# Patient Record
Sex: Male | Born: 1940 | Race: White | Hispanic: No | Marital: Single | Smoking: Former smoker
Health system: Southern US, Community
[De-identification: ages and names within clinical notes are randomized; demographics above are authoritative.]

## PROBLEM LIST (undated history)

## (undated) ENCOUNTER — Emergency Department (HOSPITAL_COMMUNITY): Admission: EM | Payer: Self-pay | Source: Home / Self Care

## (undated) DIAGNOSIS — M109 Gout, unspecified: Secondary | ICD-10-CM

## (undated) DIAGNOSIS — I10 Essential (primary) hypertension: Secondary | ICD-10-CM

## (undated) DIAGNOSIS — M1990 Unspecified osteoarthritis, unspecified site: Secondary | ICD-10-CM

## (undated) DIAGNOSIS — K429 Umbilical hernia without obstruction or gangrene: Secondary | ICD-10-CM

## (undated) DIAGNOSIS — I2510 Atherosclerotic heart disease of native coronary artery without angina pectoris: Secondary | ICD-10-CM

## (undated) DIAGNOSIS — N200 Calculus of kidney: Secondary | ICD-10-CM

## (undated) DIAGNOSIS — I493 Ventricular premature depolarization: Secondary | ICD-10-CM

## (undated) DIAGNOSIS — I714 Abdominal aortic aneurysm, without rupture, unspecified: Secondary | ICD-10-CM

## (undated) DIAGNOSIS — K439 Ventral hernia without obstruction or gangrene: Secondary | ICD-10-CM

## (undated) DIAGNOSIS — I340 Nonrheumatic mitral (valve) insufficiency: Secondary | ICD-10-CM

## (undated) DIAGNOSIS — E669 Obesity, unspecified: Secondary | ICD-10-CM

## (undated) DIAGNOSIS — I255 Ischemic cardiomyopathy: Secondary | ICD-10-CM

## (undated) DIAGNOSIS — E119 Type 2 diabetes mellitus without complications: Secondary | ICD-10-CM

## (undated) DIAGNOSIS — E785 Hyperlipidemia, unspecified: Secondary | ICD-10-CM

## (undated) DIAGNOSIS — G709 Myoneural disorder, unspecified: Secondary | ICD-10-CM

## (undated) DIAGNOSIS — Z789 Other specified health status: Secondary | ICD-10-CM

## (undated) DIAGNOSIS — I5022 Chronic systolic (congestive) heart failure: Secondary | ICD-10-CM

## (undated) DIAGNOSIS — K219 Gastro-esophageal reflux disease without esophagitis: Secondary | ICD-10-CM

## (undated) HISTORY — DX: Essential (primary) hypertension: I10

## (undated) HISTORY — DX: Other specified health status: Z78.9

## (undated) HISTORY — PX: CARDIAC CATHETERIZATION: SHX172

## (undated) HISTORY — DX: Abdominal aortic aneurysm, without rupture, unspecified: I71.40

## (undated) HISTORY — PX: CARPAL TUNNEL RELEASE: SHX101

## (undated) HISTORY — DX: Ventral hernia without obstruction or gangrene: K43.9

## (undated) HISTORY — DX: Hyperlipidemia, unspecified: E78.5

## (undated) HISTORY — DX: Gout, unspecified: M10.9

## (undated) HISTORY — DX: Obesity, unspecified: E66.9

## (undated) HISTORY — DX: Ischemic cardiomyopathy: I25.5

## (undated) HISTORY — DX: Abdominal aortic aneurysm, without rupture: I71.4

## (undated) HISTORY — PX: ROTATOR CUFF REPAIR: SHX139

## (undated) HISTORY — DX: Nonrheumatic mitral (valve) insufficiency: I34.0

## (undated) HISTORY — DX: Type 2 diabetes mellitus without complications: E11.9

## (undated) HISTORY — DX: Ventricular premature depolarization: I49.3

## (undated) HISTORY — DX: Calculus of kidney: N20.0

## (undated) HISTORY — DX: Umbilical hernia without obstruction or gangrene: K42.9

---

## 1998-09-03 ENCOUNTER — Ambulatory Visit (HOSPITAL_BASED_OUTPATIENT_CLINIC_OR_DEPARTMENT_OTHER): Admission: RE | Admit: 1998-09-03 | Discharge: 1998-09-03 | Payer: Self-pay | Admitting: Orthopedic Surgery

## 1999-02-04 ENCOUNTER — Ambulatory Visit (HOSPITAL_BASED_OUTPATIENT_CLINIC_OR_DEPARTMENT_OTHER): Admission: RE | Admit: 1999-02-04 | Discharge: 1999-02-04 | Payer: Self-pay | Admitting: Orthopedic Surgery

## 1999-03-25 ENCOUNTER — Ambulatory Visit (HOSPITAL_BASED_OUTPATIENT_CLINIC_OR_DEPARTMENT_OTHER): Admission: RE | Admit: 1999-03-25 | Discharge: 1999-03-25 | Payer: Self-pay | Admitting: Orthopedic Surgery

## 1999-07-30 ENCOUNTER — Encounter: Payer: Self-pay | Admitting: Orthopedic Surgery

## 1999-07-30 ENCOUNTER — Encounter: Admission: RE | Admit: 1999-07-30 | Discharge: 1999-07-30 | Payer: Self-pay | Admitting: Orthopedic Surgery

## 2004-12-16 ENCOUNTER — Ambulatory Visit: Payer: Self-pay | Admitting: Family Medicine
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
## 2005-11-15 ENCOUNTER — Other Ambulatory Visit: Payer: Self-pay

## 2005-11-15 ENCOUNTER — Emergency Department: Payer: Self-pay | Admitting: Emergency Medicine

## 2005-11-15 IMAGING — CR DG CHEST 1V PORT
1 series · 1 of 1 positions shown · non-contrast
Comparison: none

REASON FOR EXAM: CHEST PAIN
COMMENTS:

PROCEDURE:     DXR - DXR PORTABLE CHEST SINGLE VIEW  - [DATE]  [DATE]
RESULT:          The lungs are clear.  The cardiac silhouette and visualized
bony skeleton are unremarkable.

[view not recorded]
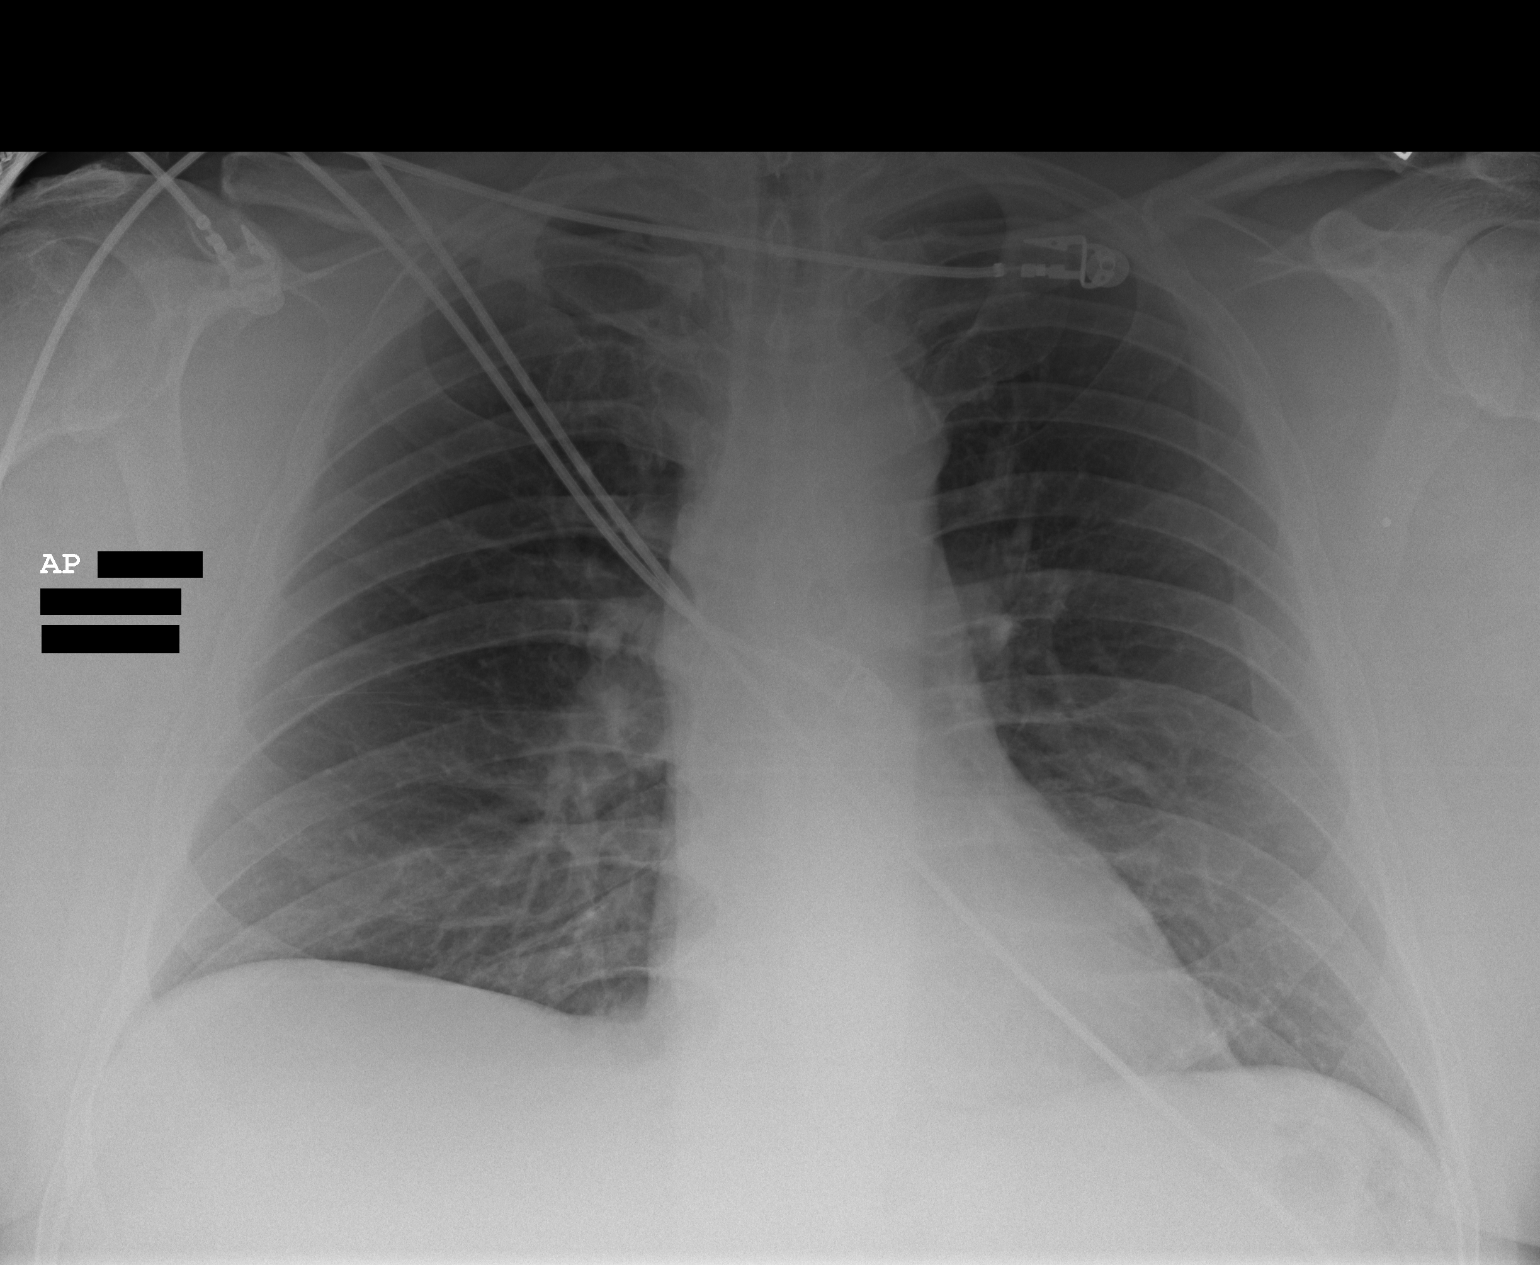

[1 of 1 positions shown; findings below may reference images not displayed]

IMPRESSION: Chest radiograph without evidence of acute
cardiopulmonary disease.

## 2006-04-27 ENCOUNTER — Emergency Department: Payer: Self-pay | Admitting: Emergency Medicine

## 2006-04-27 IMAGING — CT CT STONE STUDY
1 of 2 series · 15 of 32 positions shown, 19 images · non-contrast
Comparison: none

REASON FOR EXAM: Abdominal pain. Rm 2
COMMENTS:

[Series 2: stone · axial · 0.94mm/px · z∈[-348,+86]mm · 15 of 159 slices shown, 19 images]
[im 7/159  soft-tissue]
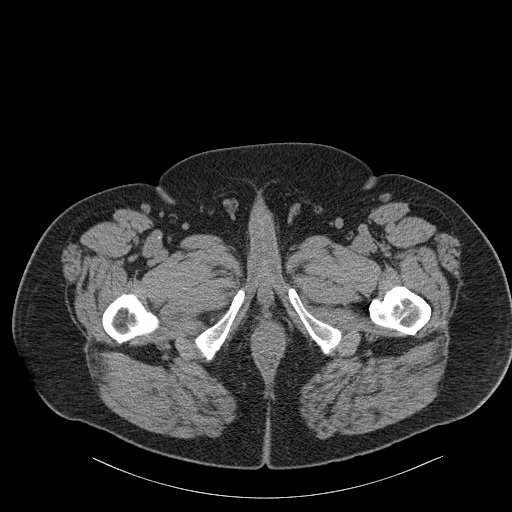
[im 7/159  bone]
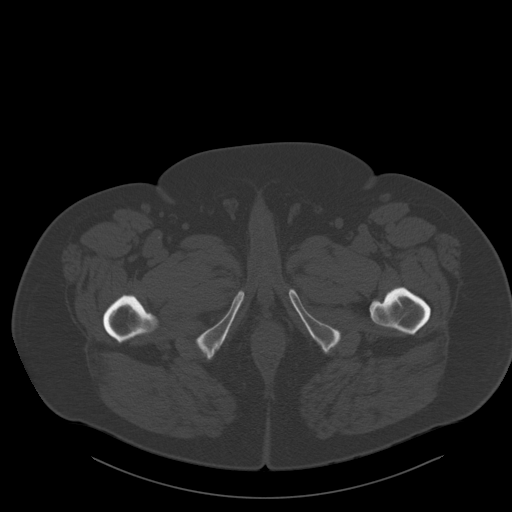
[im 20/159  soft-tissue]
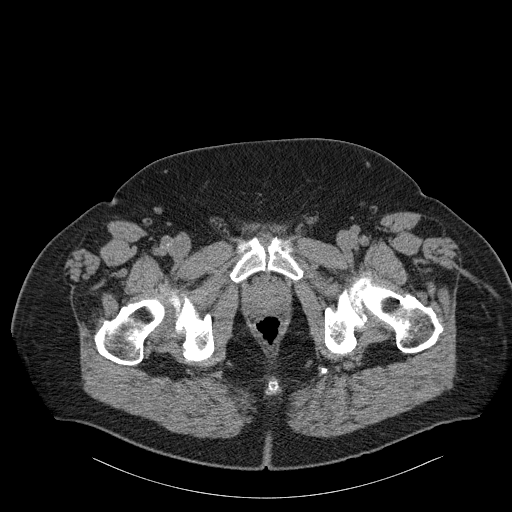
[im 33/159  soft-tissue]
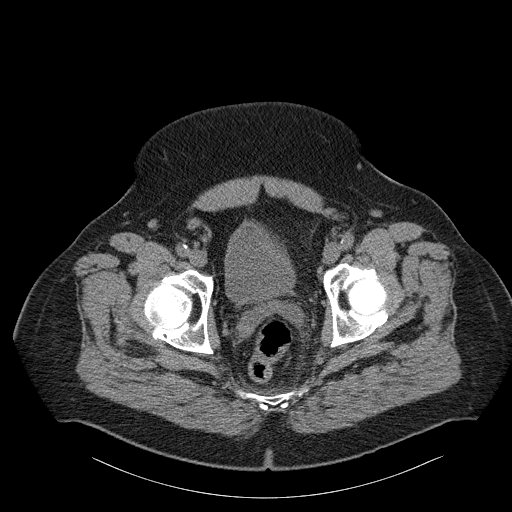
[im 47/159  soft-tissue]
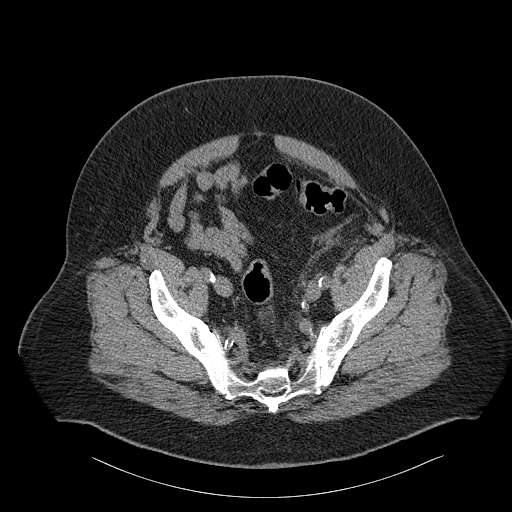
[im 53/159  soft-tissue]
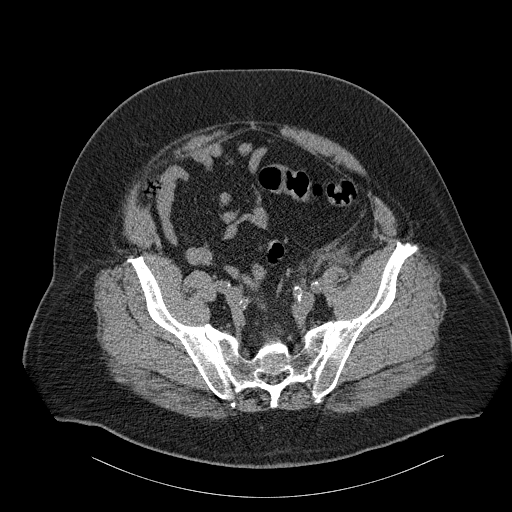
[im 66/159  soft-tissue]
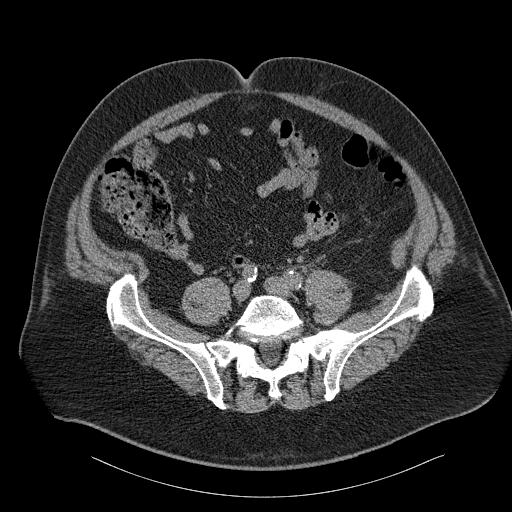
[im 80/159  soft-tissue]
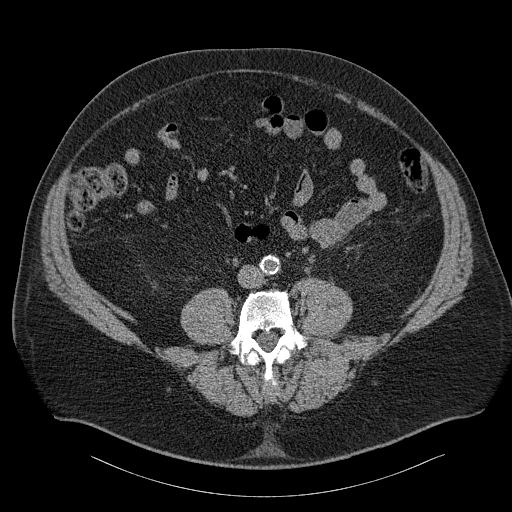
[im 93/159  soft-tissue]
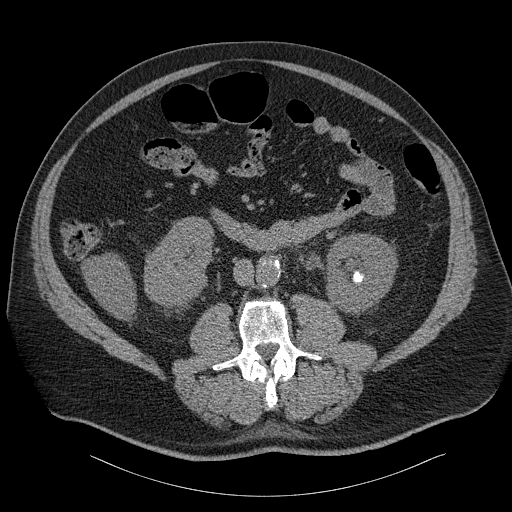
[im 106/159  soft-tissue]
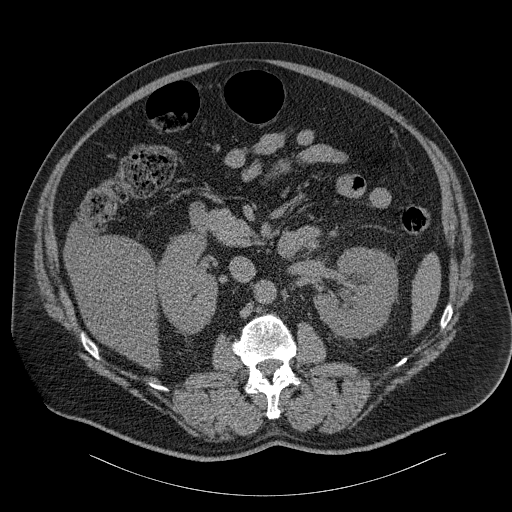
[im 106/159  bone]
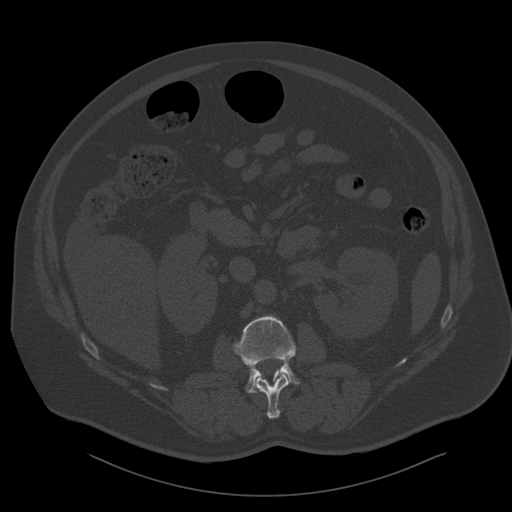
[im 112/159  soft-tissue]
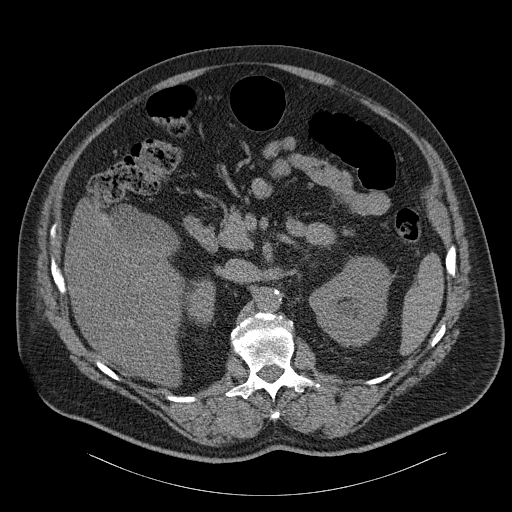
[im 126/159  soft-tissue]
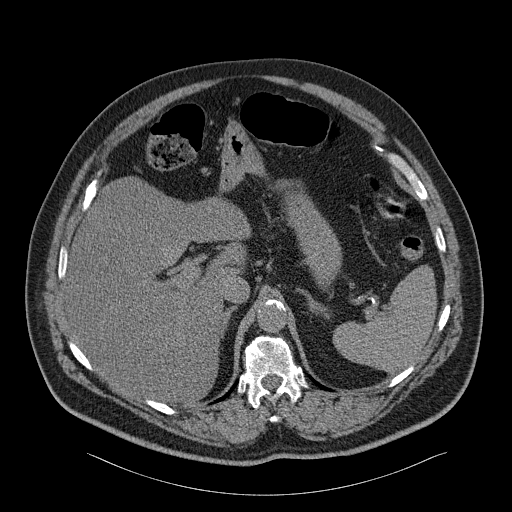
[im 132/159  lung]
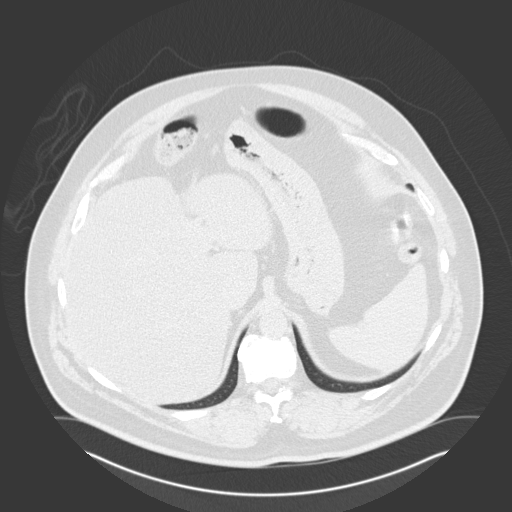
[im 139/159  soft-tissue]
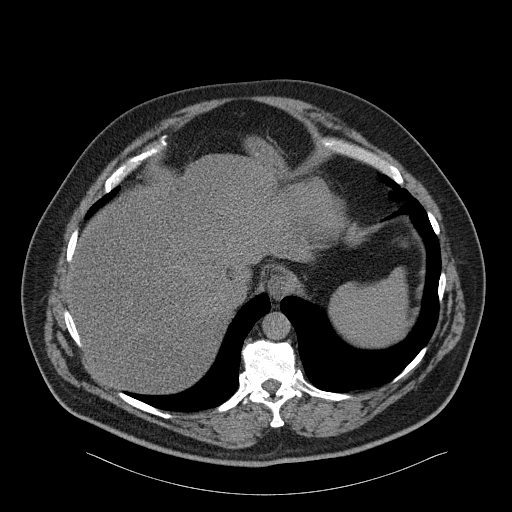
[im 139/159  lung]
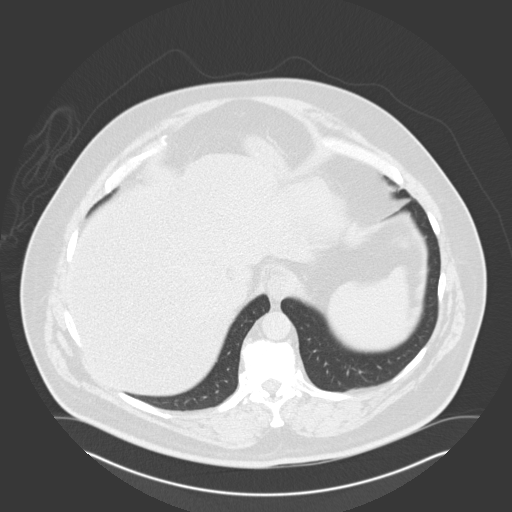
[im 145/159  lung]
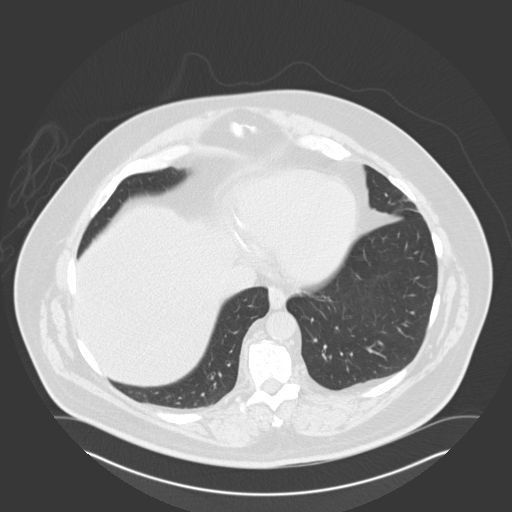
[im 152/159  soft-tissue]
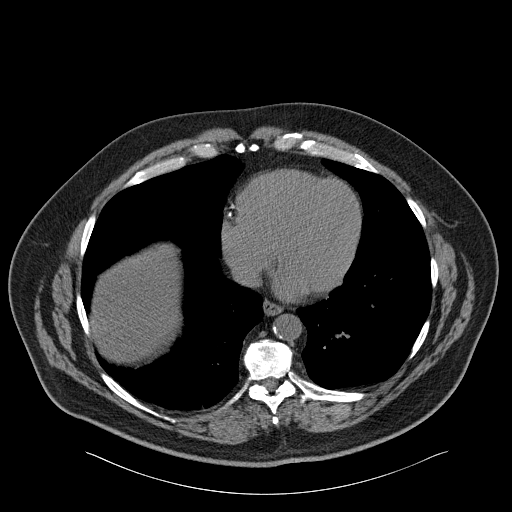
[im 152/159  lung]
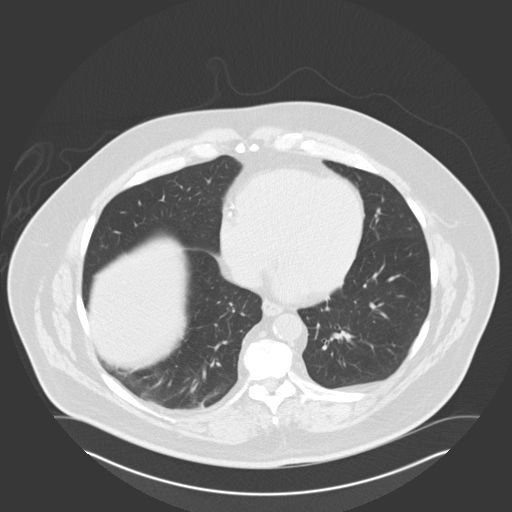

[15 of 32 positions shown; findings below may reference images not displayed]

PROCEDURE:     CT  - CT ABDOMEN /PELVIS WO (STONE)  - [DATE] [DATE]

RESULT:     Noncontrast emergent CT scan of the abdomen and pelvis is
performed.  The patient has no prior studies in the PACS system for
comparison.  The study demonstrates multiple stones in the LEFT renal
collecting system. These appear to be fairly large with the largest in the
lower pole region measuring up to 10 to 12 mm in diameter.  There is a stone
in the LEFT ureteropelvic junction region measuring approximately 5 mm
causing moderate hydronephrosis.  There is an extrinsic lesion from the
RIGHT kidney inferomedially along its posterior margin for which follow-up
triphasic CT may be beneficial.  This area is small.  There is some
perinephric stranding bilaterally which may be old.  Some acute inflammatory
stranding on the LEFT could not be excluded.  Atherosclerotic calcification
is seen in the aorta.  No definite bladder stones are evident.  There is
some thickening of the perirenal fascia on the LEFT with some extension
along the fascial plane into the LEFT lower quadrant.  No abnormal bowel
distention is seen.  The other abdominal viscera appear to be grossly
normal.
IMPRESSION: Multiple stones of the LEFT kidney with an obstructing 5.5 mm LEFT UPJ
stone.

Some perinephric stranding is seen.

Possible mass arising from the posterior inferomedial aspect of the RIGHT
kidney for which triphasic CT is recommended.

Atherosclerotic calcification.

## 2006-04-29 ENCOUNTER — Ambulatory Visit: Payer: Self-pay | Admitting: Urology

## 2006-04-29 IMAGING — CR DG ABDOMEN 1V
1 series · 2 of 2 positions shown · non-contrast
Comparison: none

REASON FOR EXAM: XRAY KUB NEPHROLITHIASIS PT NEED FILMS
COMMENTS:

[Series 1: view not recorded · 0.17mm/px · 2 of 2 slices shown]
[im 1/2]
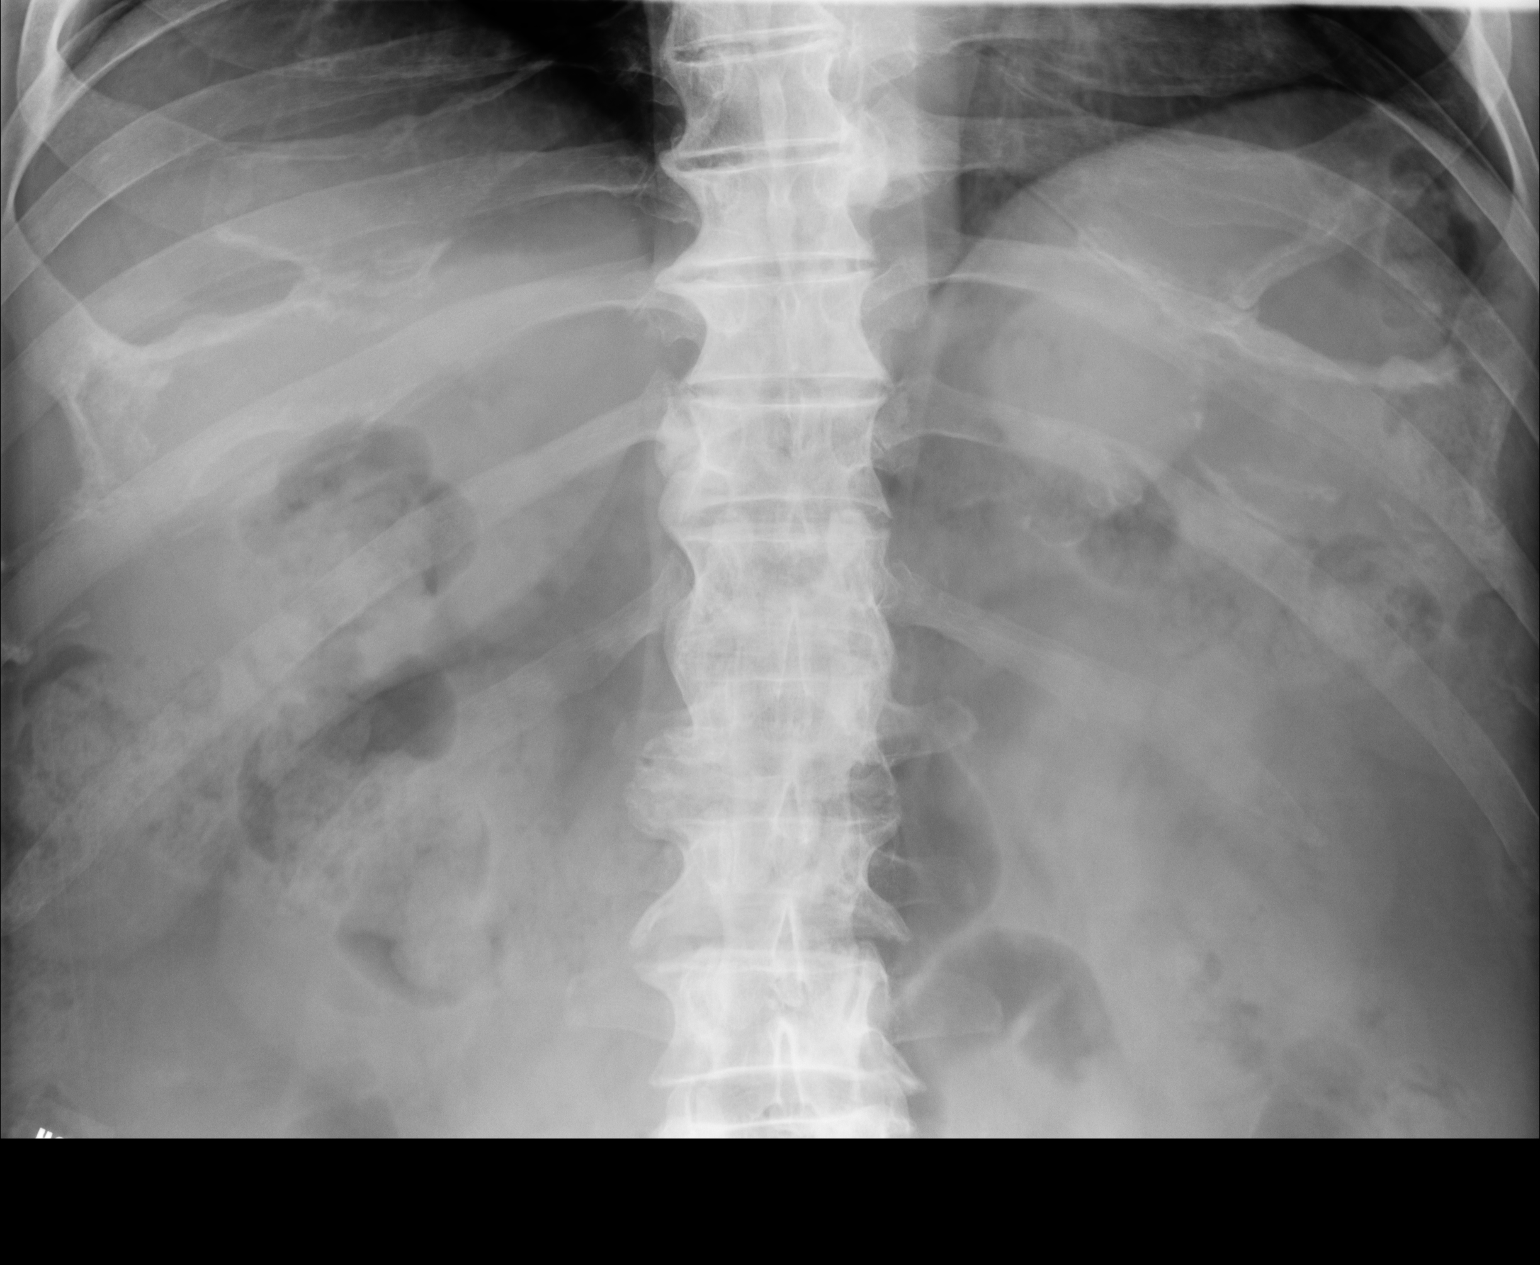
[im 2/2]
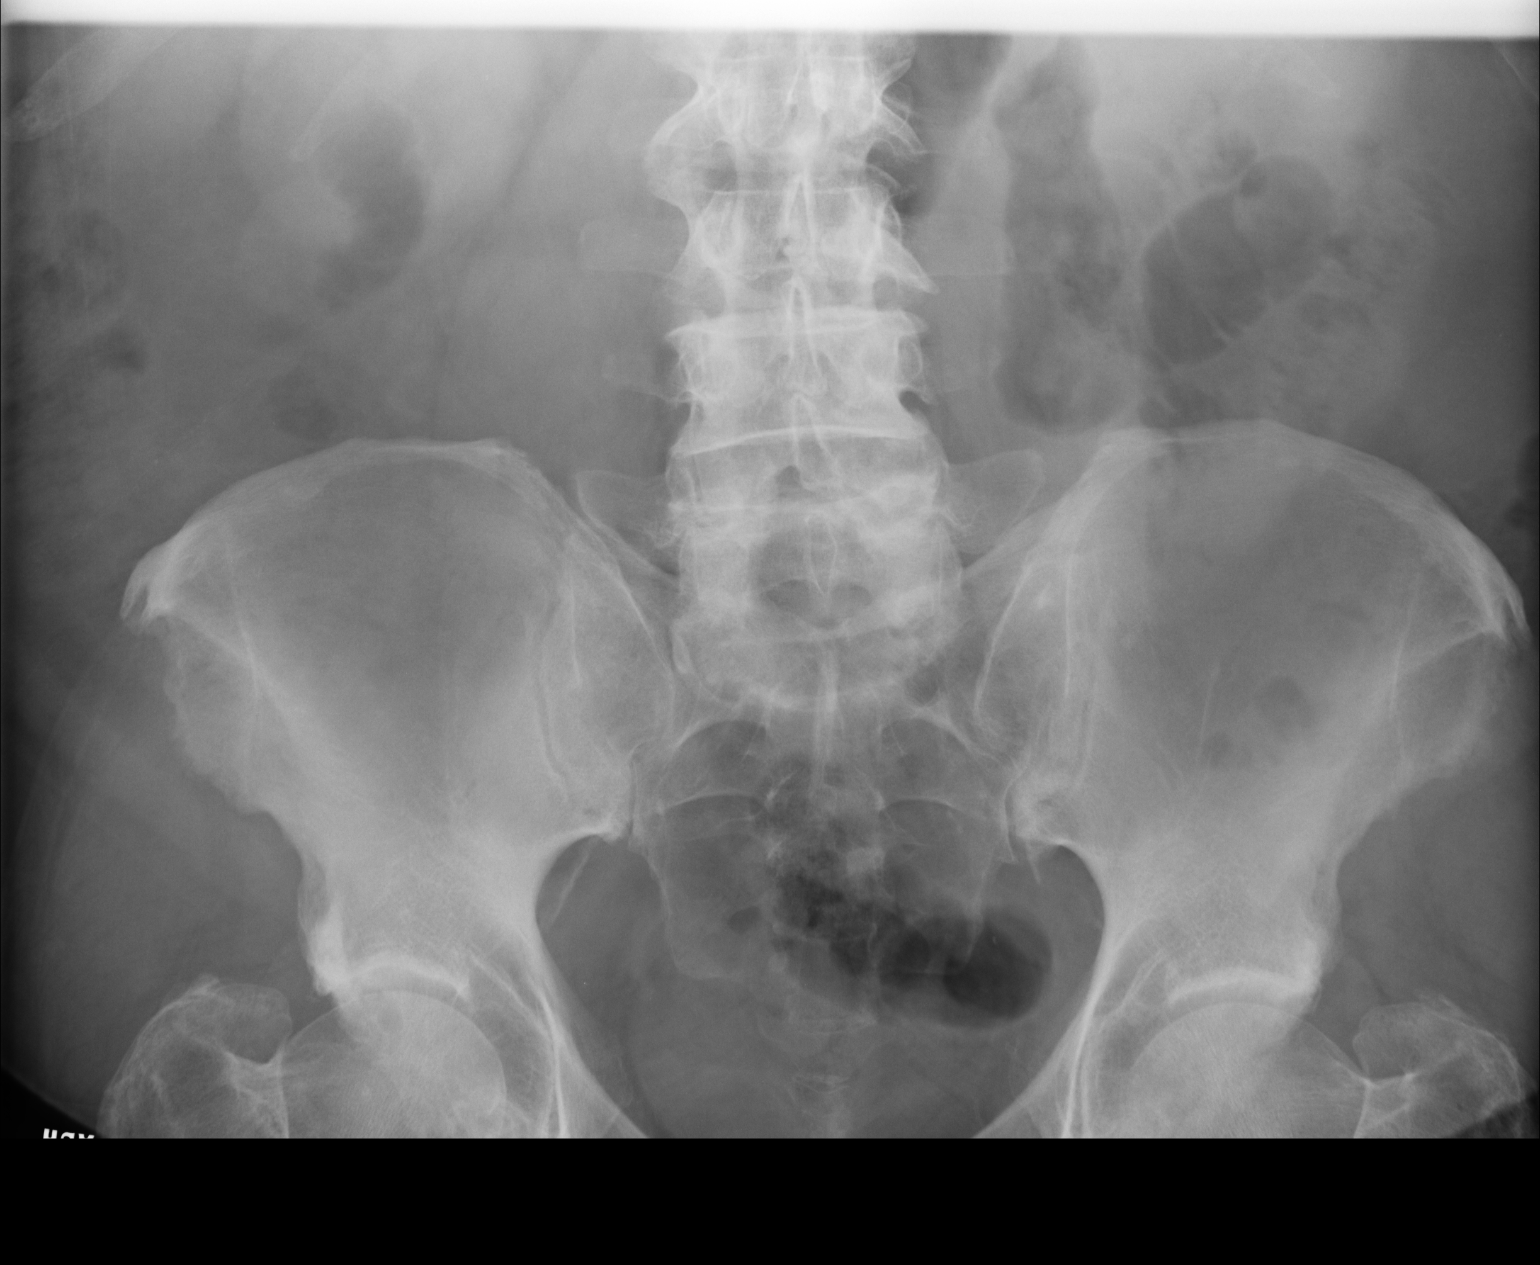

[2 of 2 positions shown; findings below may reference images not displayed]

PROCEDURE:     DXR - DXR KIDNEY URETER BLADDER  - [DATE]  [DATE]

RESULT:     AP view of the abdomen was obtained.  The multiple LEFT renal
stones noted at CT are not well seen on plain film examination.  There is a
possible faint calcification at the lower pole of the LEFT kidney, but the
previously noted stone at the LEFT ureteropelvic junction is not noted.  No
definite distal ureteral stones are seen. No renal or ureteral stones are
noted on the RIGHT.
IMPRESSION: 1)The patient has multiple LEFT renal stones documented at CT, but these are
not well seen on plain film examination.

## 2006-06-15 ENCOUNTER — Ambulatory Visit: Payer: Self-pay | Admitting: Urology

## 2006-06-15 IMAGING — CT CT PELVIS W/O CM
1 series · 16 of 32 positions shown, 20 images · non-contrast
Comparison: none

REASON FOR EXAM: Uric acid stones and renal mass
COMMENTS:

[Series 2: pelvis · axial · 0.74mm/px · z∈[+30,+262]mm · 16 of 34 slices shown, 20 images]
[im 3/34  soft-tissue]
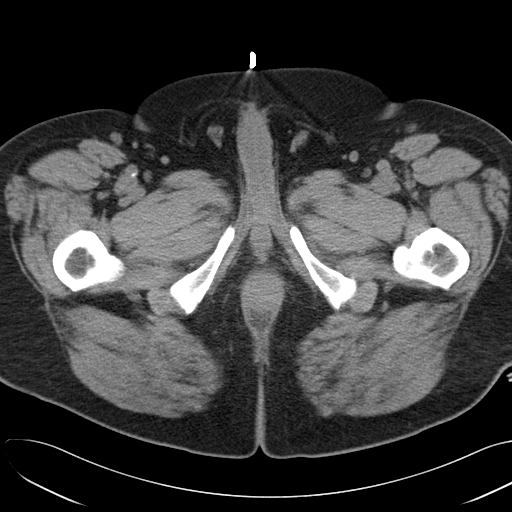
[im 3/34  bone]
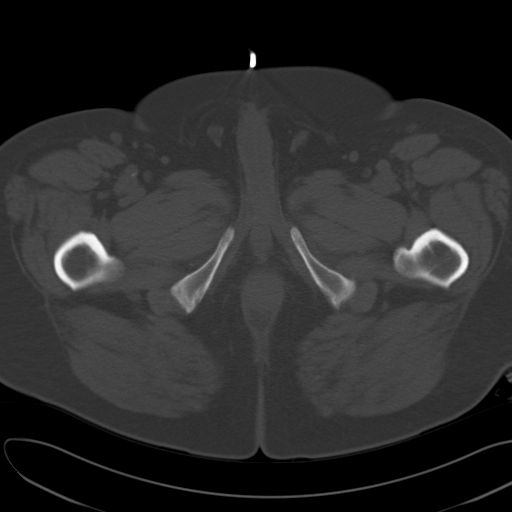
[im 5/34  soft-tissue]
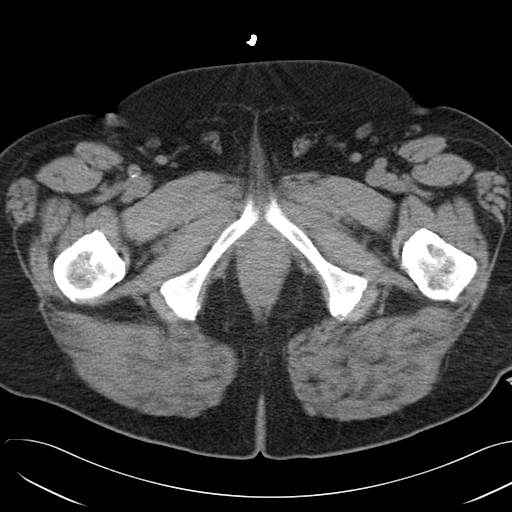
[im 7/34  soft-tissue]
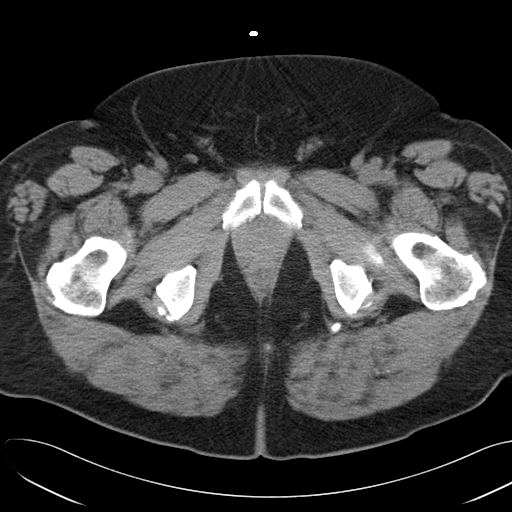
[im 9/34  soft-tissue]
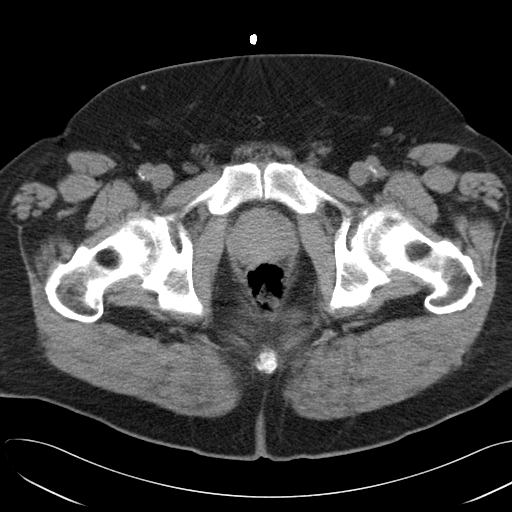
[im 11/34  soft-tissue]
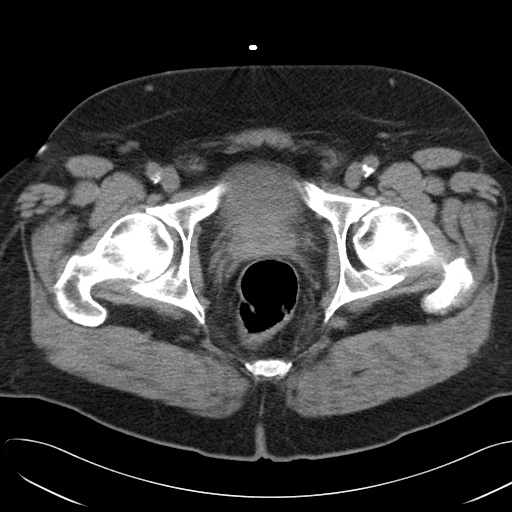
[im 13/34  soft-tissue]
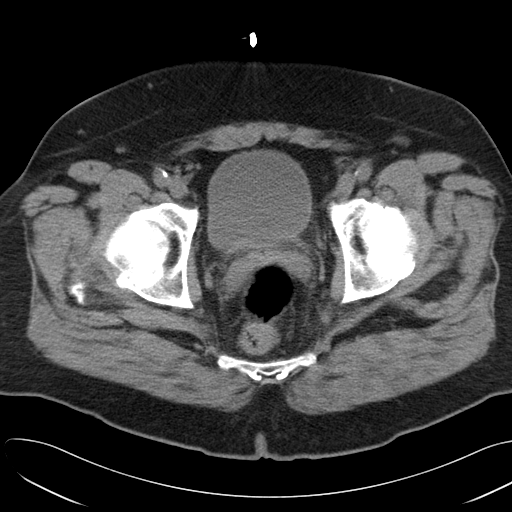
[im 15/34  soft-tissue]
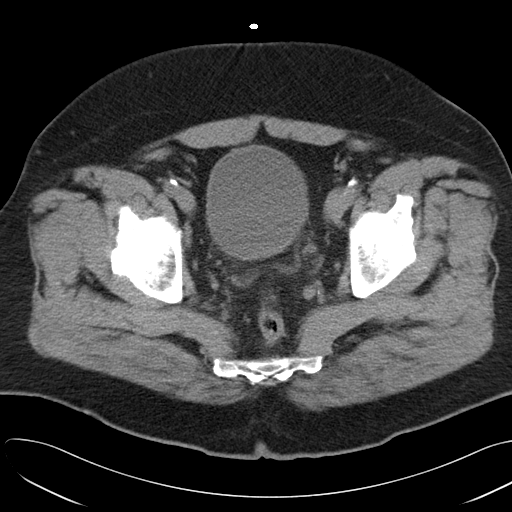
[im 19/34  soft-tissue]
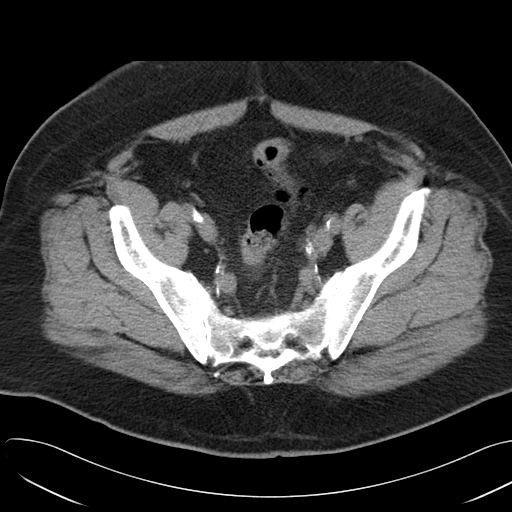
[im 21/34  soft-tissue]
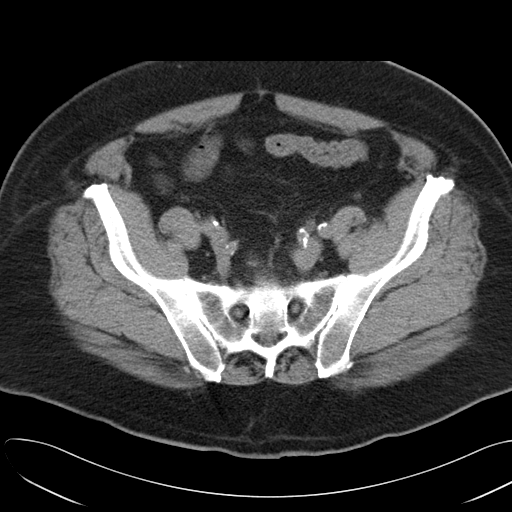
[im 21/34  bone]
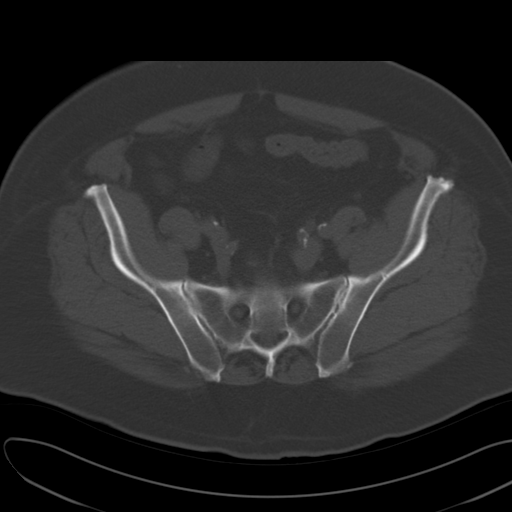
[im 23/34  soft-tissue]
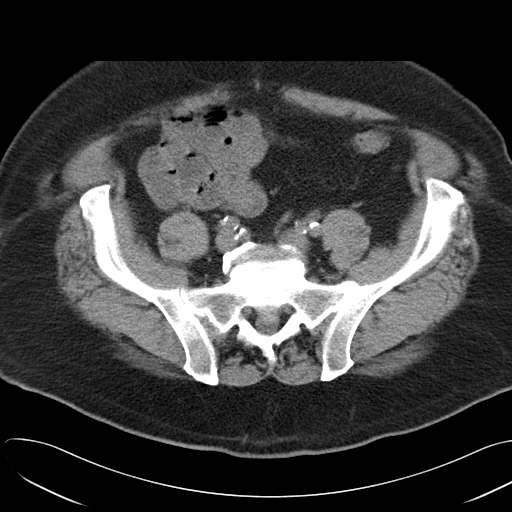
[im 25/34  soft-tissue]
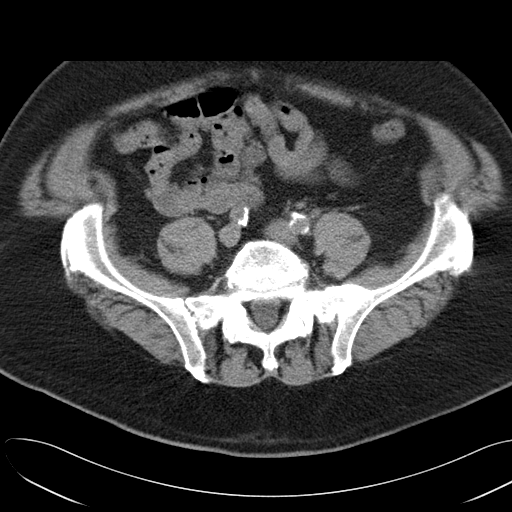
[im 27/34  soft-tissue]
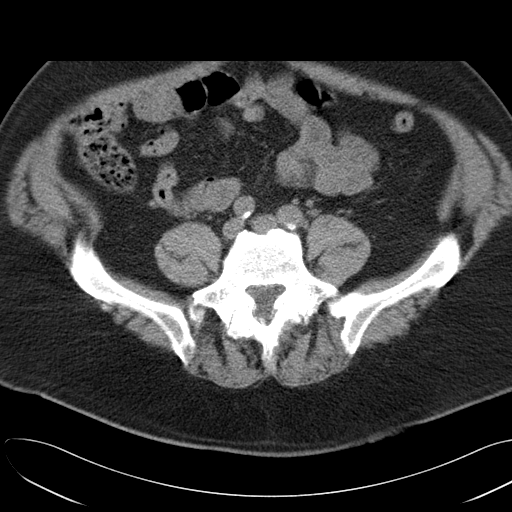
[im 29/34  soft-tissue]
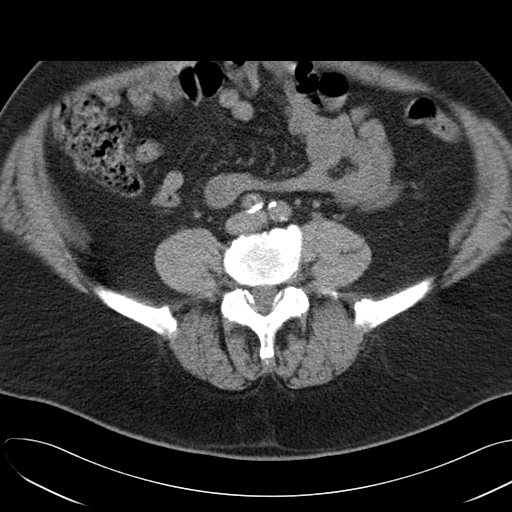
[im 29/34  lung]
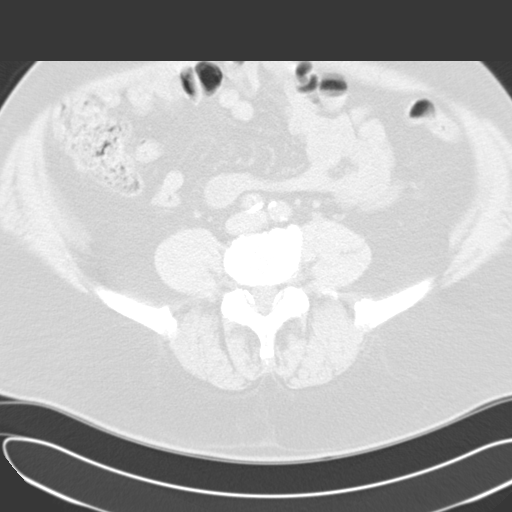
[im 30/34  lung]
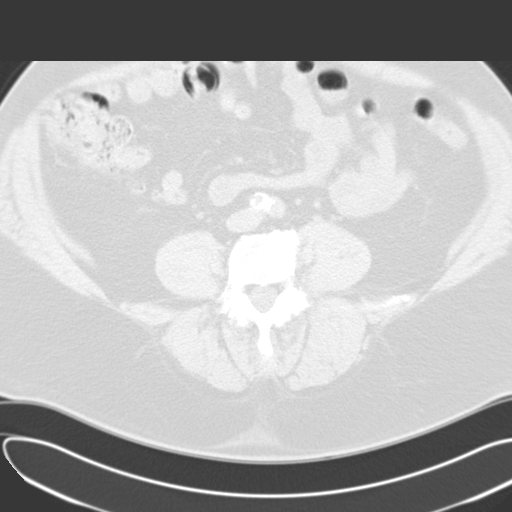
[im 31/34  soft-tissue]
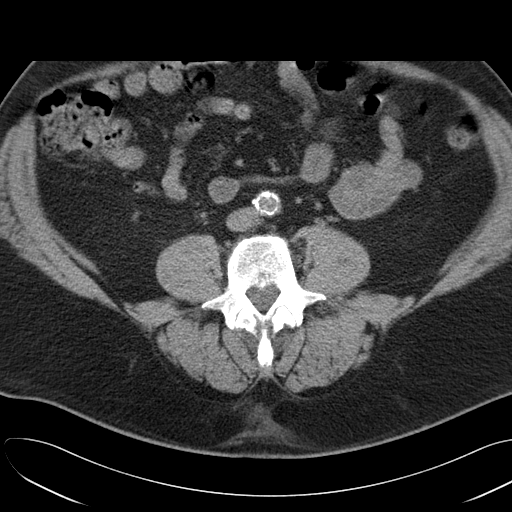
[im 31/34  lung]
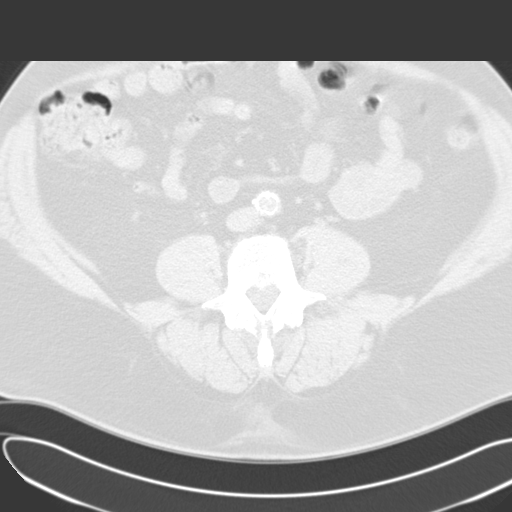
[im 32/34  lung]
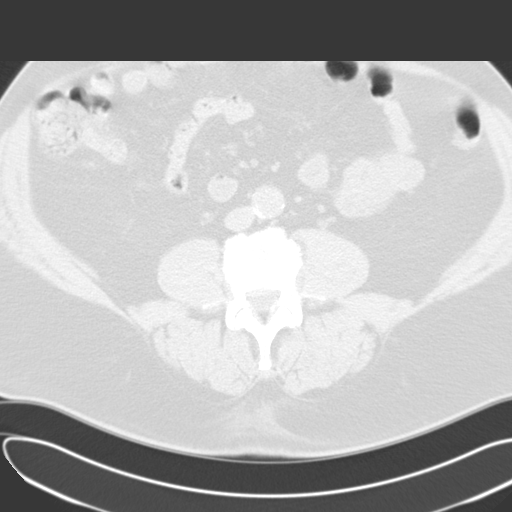

[16 of 32 positions shown; findings below may reference images not displayed]

PROCEDURE:     CT  - CT PELVIS STANDARD WO  - [DATE]  [DATE]

RESULT:     The study was performed without contrast as requested.

Within the pelvis the urinary bladder is partially distended.  The prostate
gland produces a mild impression upon the urinary bladder base.  I do not
see free fluid in the pelvis.  The sigmoid colon and rectum are grossly
normal though nondistended.  I do not see evidence of an inguinal hernia.
No inguinal or pelvic sidewall lymphadenopathy is seen.
IMPRESSION: I do not see abnormality of the urinary bladder on this noncontrast study.
The mildly enlarged prostate gland produces an impression upon the urinary
bladder base.

I see no acute abnormality of the sigmoid colon or evidence of
lymphadenopathy.

## 2006-06-15 IMAGING — CT CT ABDOMEN WO/W CM
2 of 4 series · 14 of 32 positions shown, 19 images · non-contrast
Comparison: none

REASON FOR EXAM: Uric Acid Stones and Renal Mass
COMMENTS:

[Series 3: without · axial · non-contrast · 0.88mm/px · z∈[+222,+446]mm · 8 of 37 slices shown, 13 images]
[im 5/37  soft-tissue]
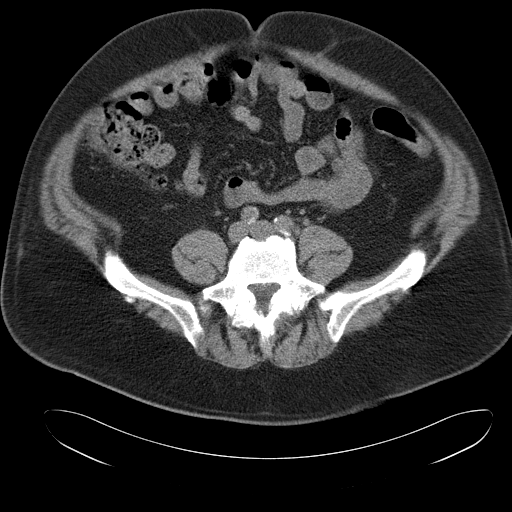
[im 5/37  bone]
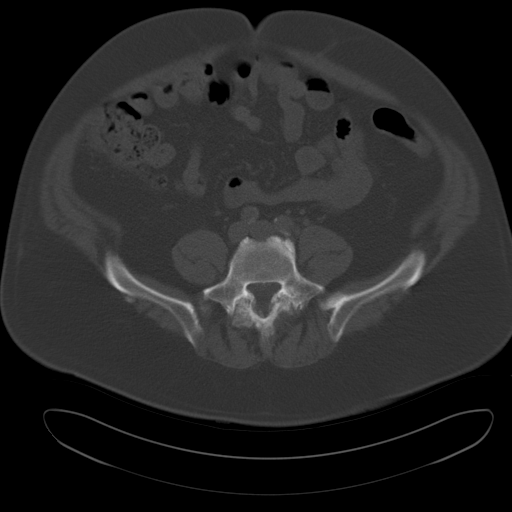
[im 9/37  soft-tissue]
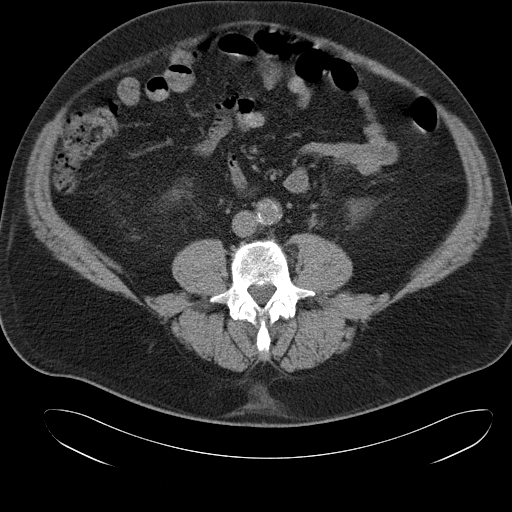
[im 13/37  soft-tissue]
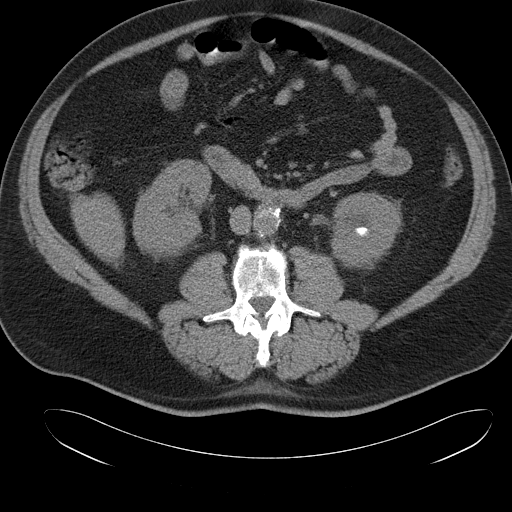
[im 17/37  soft-tissue]
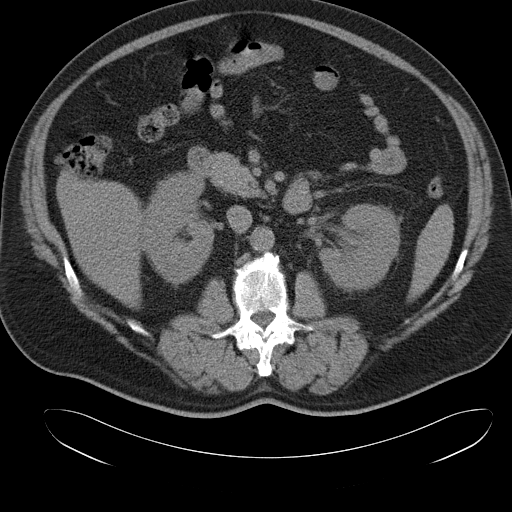
[im 21/37  soft-tissue]
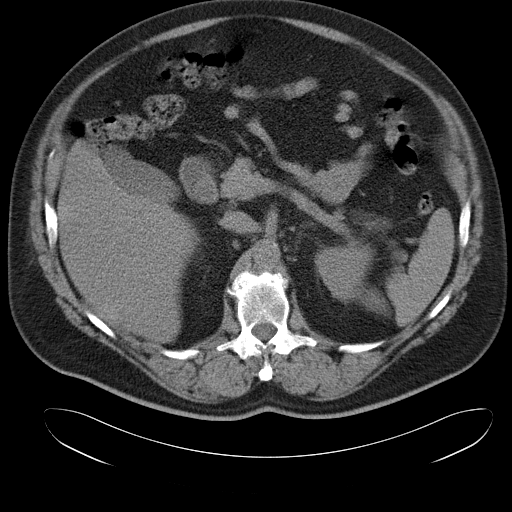
[im 21/37  lung]
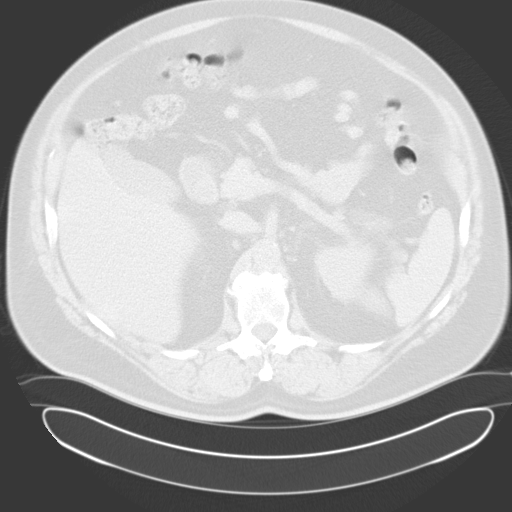
[im 25/37  soft-tissue]
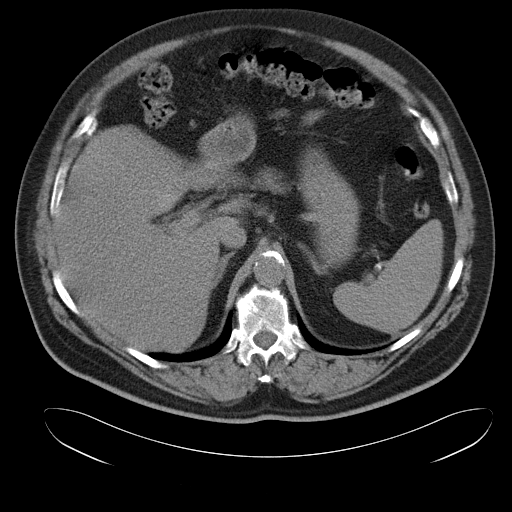
[im 25/37  lung]
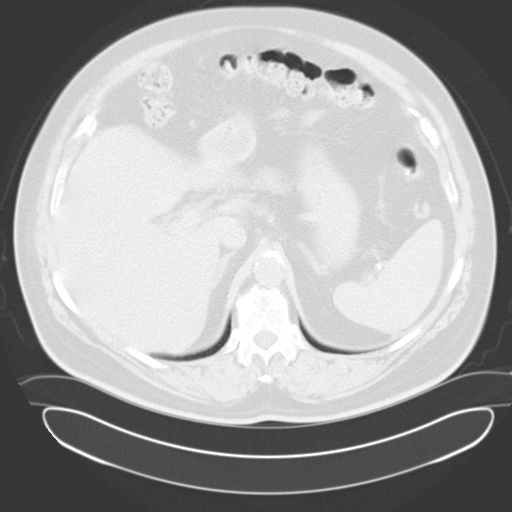
[im 29/37  soft-tissue]
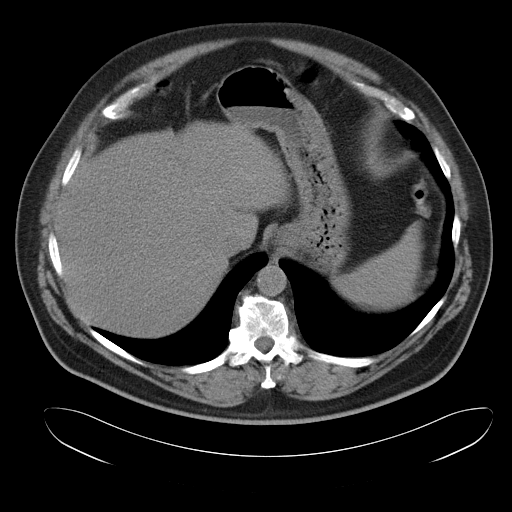
[im 29/37  lung]
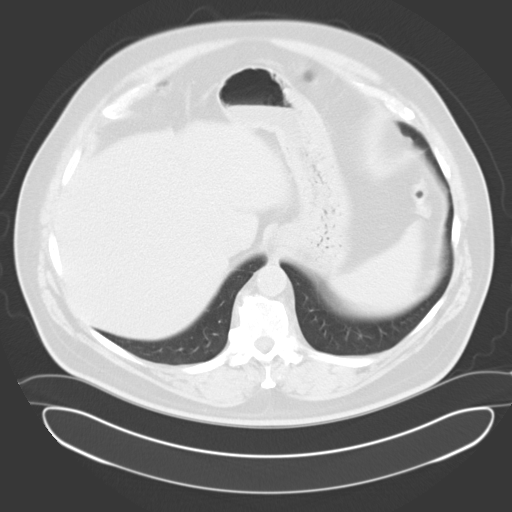
[im 33/37  soft-tissue]
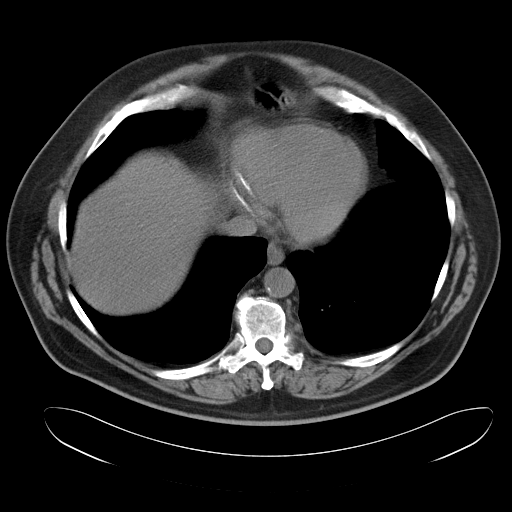
[im 33/37  lung]
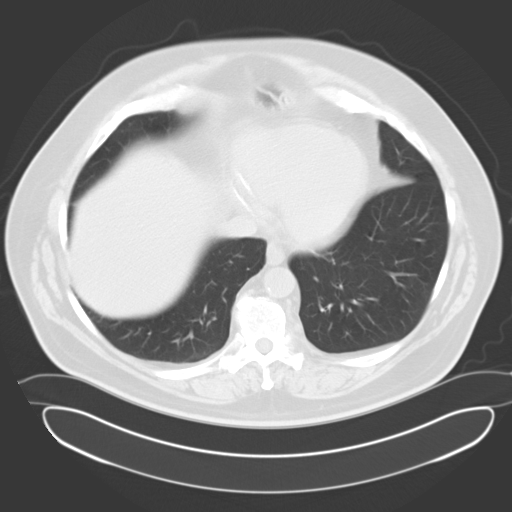

[Series 5: with · axial · 0.96mm/px · z∈[+222,+406]mm · 6 of 37 slices shown]
[im 5/37  soft-tissue]
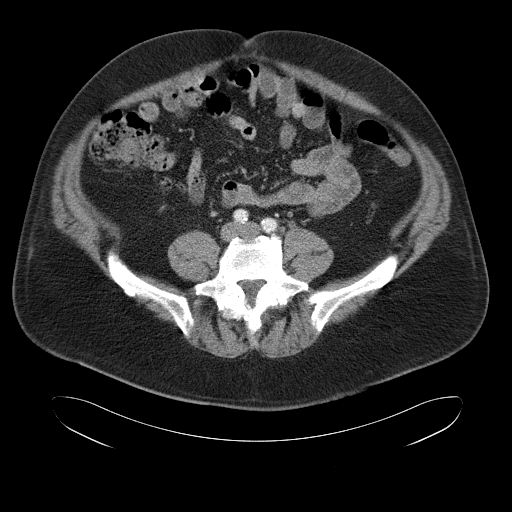
[im 10/37  soft-tissue]
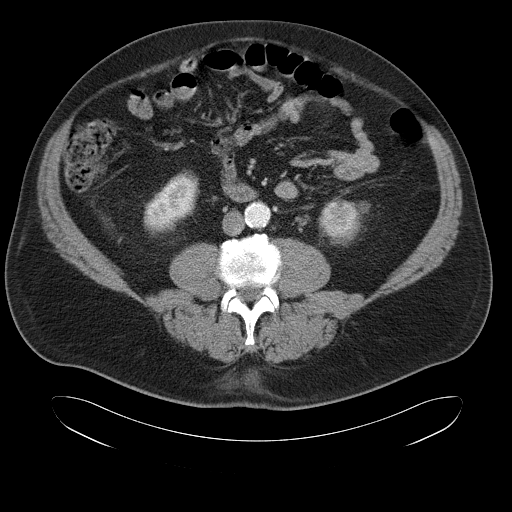
[im 14/37  soft-tissue]
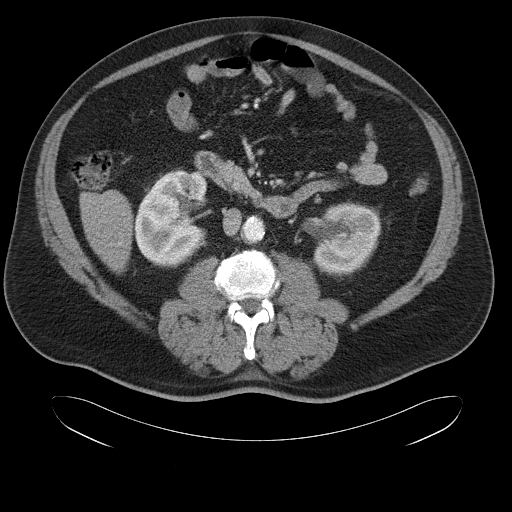
[im 19/37  soft-tissue]
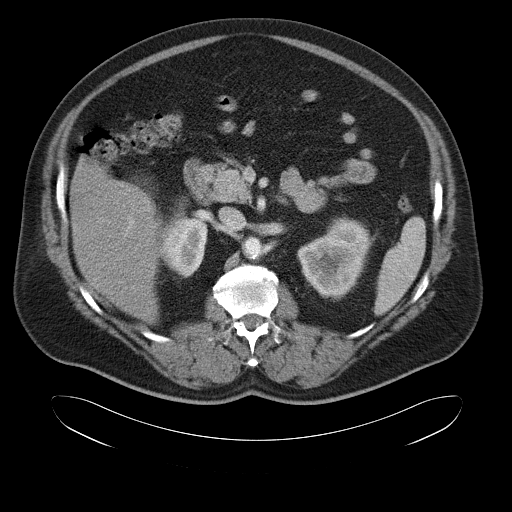
[im 23/37  soft-tissue]
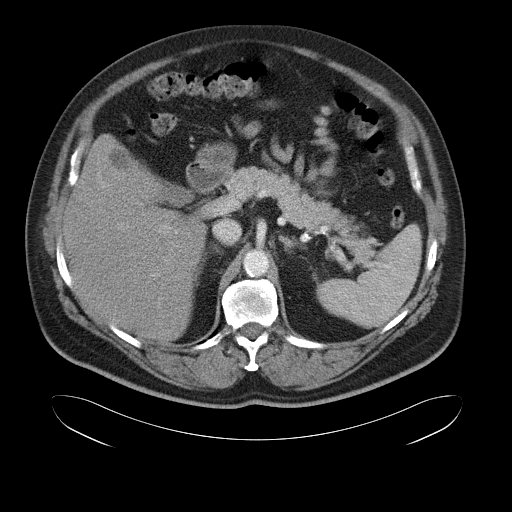
[im 28/37  soft-tissue]
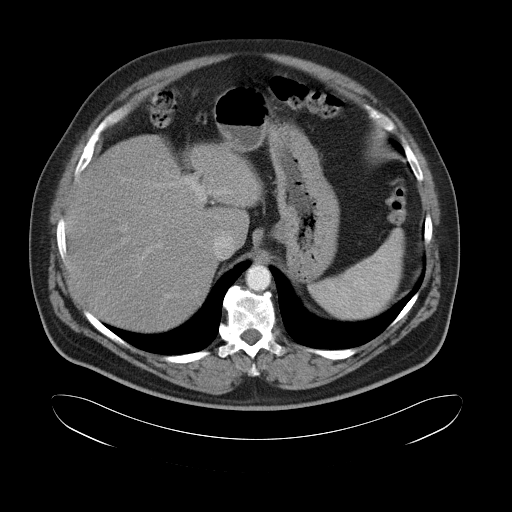

[14 of 32 positions shown; findings below may reference images not displayed]

PROCEDURE:     CT  - CT ABDOMEN STANDARD W/WO  - [DATE]  [DATE]

RESULT:       The patient is being evaluated for known uric acid stones as
well as a renal mass suspected on the RIGHT.

On the RIGHT, involving the lower pole posteriorly there is a hyperdense
focus.  It measures approximately 52 HU on the precontrast images.  This
measures 51 on the postcontrast images and on the delayed images it measures
approximately 68 HU.  This likely reflects a hyperdense cyst.

The perinephric fat is normal bilaterally.  There is a tiny hypodensity
associated with the lower pole of the LEFT kidney with Hounsfield
measurements of 3 precontrast arising to 17 postcontrast and rising further
to 31 on delayed images.  This is a relatively significant spread in the
contrast enhancement values and further evaluation of the LEFT kidney with
ultrasound will be needed.

Delayed images do reveal a relatively normal appearing renal collecting
system bilaterally.  The calcification seen on the precontrast study are
obscured by the contrast in the LEFT kidney.  There is no contrast in the
proximal LEFT ureter.

The liver, gallbladder, stomach, spleen, adrenal glands and pancreas exhibit
no acute abnormality.  The lung bases are clear.
IMPRESSION: 1.     There are known stones involving the LEFT kidney.  I do not see
evidence of hydronephrosis currently.
2.     There is a tiny exophytic lower pole hypodensity anteriorly that does
not behave in a fashion classic for a simple cyst.  Renal ultrasound is
recommended.
3.     A hypodensity seen associated with the lower pole of the RIGHT kidney
on the prior study does have Hounsfield measurements most compatible with a
hyperdense cyst.
4.     Not mentioned above, is a tiny infrarenal abdominal aortic aneurysm
with maximal measured diameter of 2.8 cm.

## 2007-03-17 ENCOUNTER — Ambulatory Visit: Payer: Self-pay | Admitting: Urology

## 2007-03-17 IMAGING — CT CT ABDOMEN WO/W CM
2 of 4 series · 14 of 32 positions shown, 19 images · non-contrast
Comparison: none

REASON FOR EXAM: Right renal mass
COMMENTS:

[Series 2: wo · axial · 0.95mm/px · z∈[-328,-120]mm · 8 of 34 slices shown, 13 images]
[im 4/34  soft-tissue]
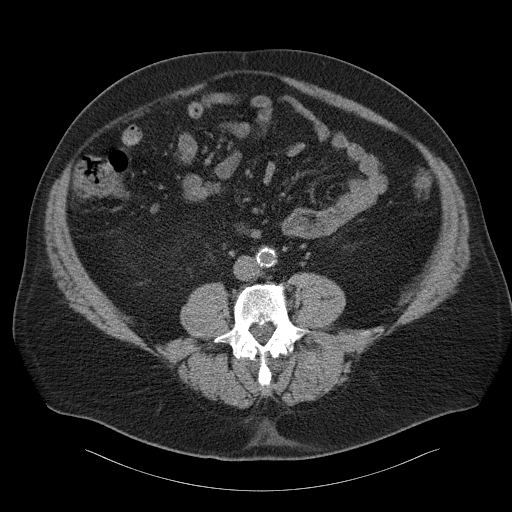
[im 4/34  bone]
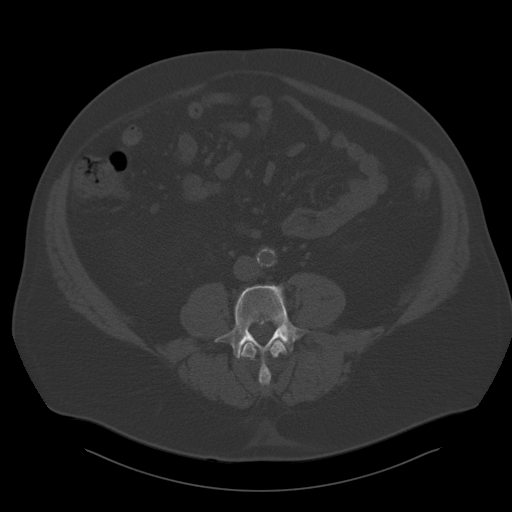
[im 8/34  soft-tissue]
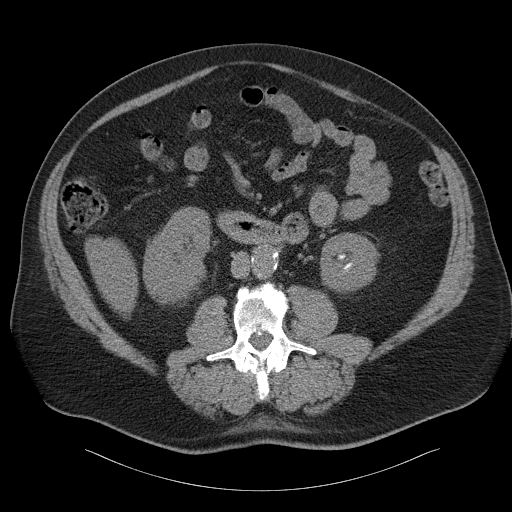
[im 12/34  soft-tissue]
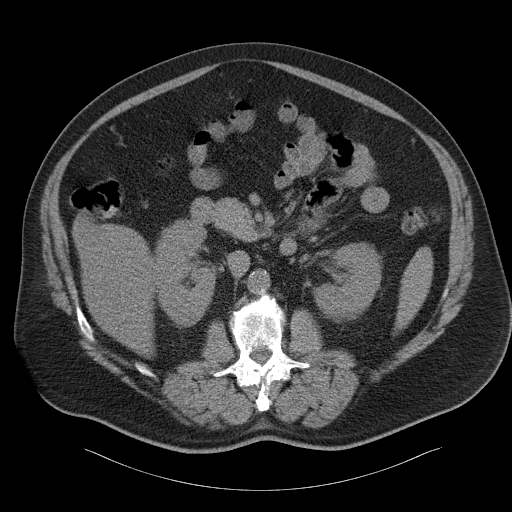
[im 15/34  soft-tissue]
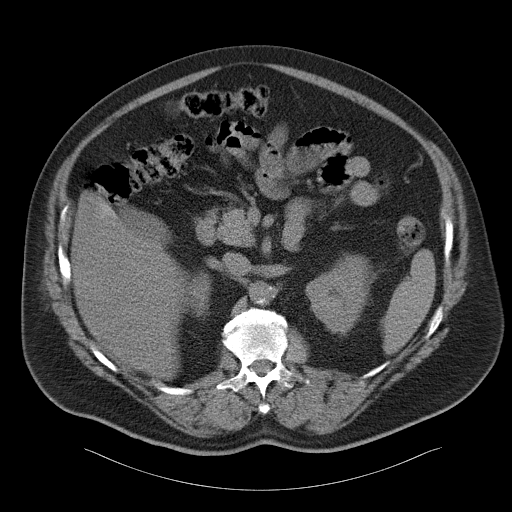
[im 19/34  soft-tissue]
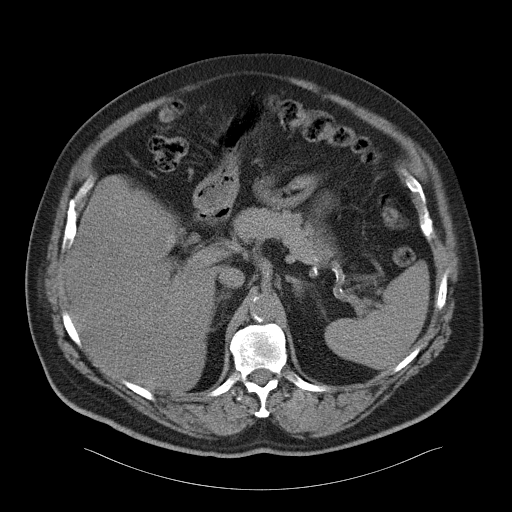
[im 19/34  lung]
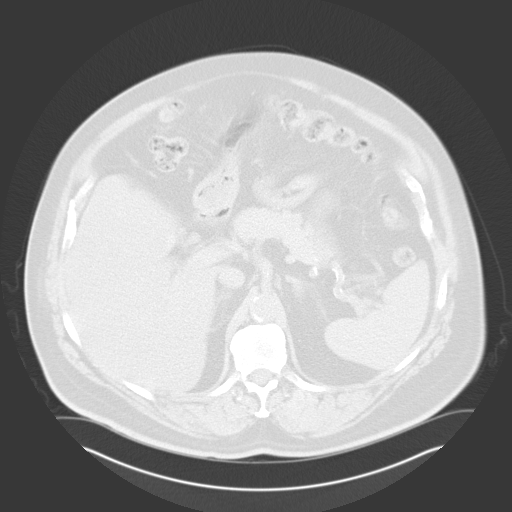
[im 23/34  soft-tissue]
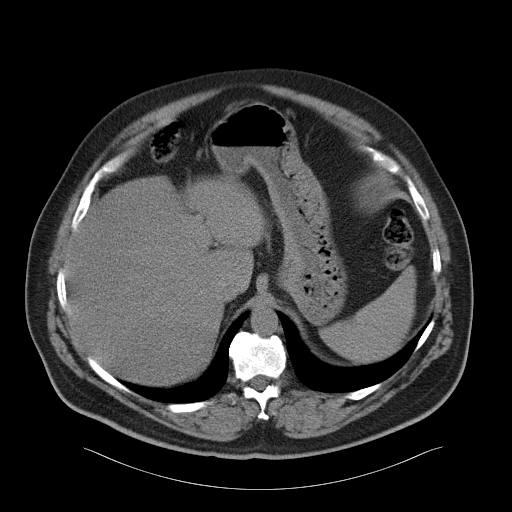
[im 23/34  lung]
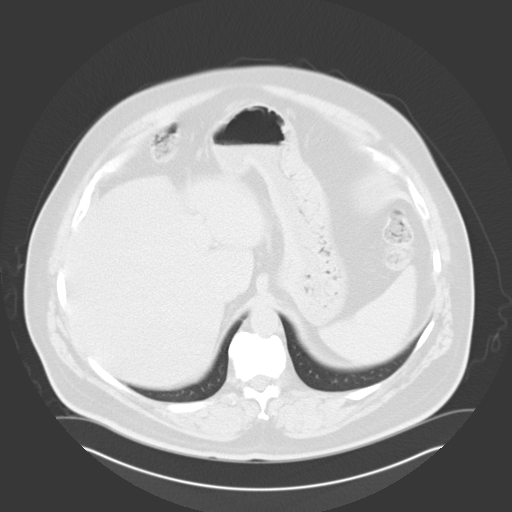
[im 26/34  soft-tissue]
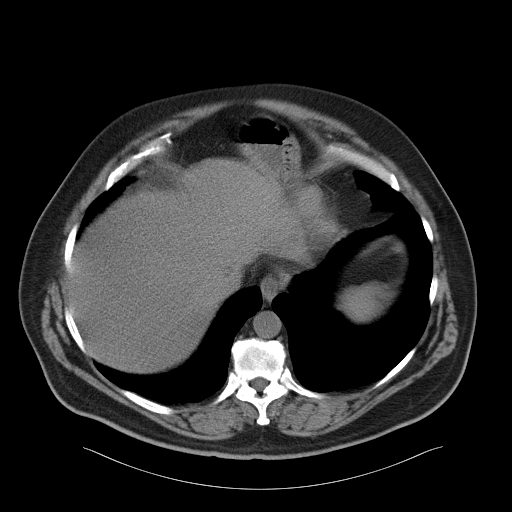
[im 26/34  lung]
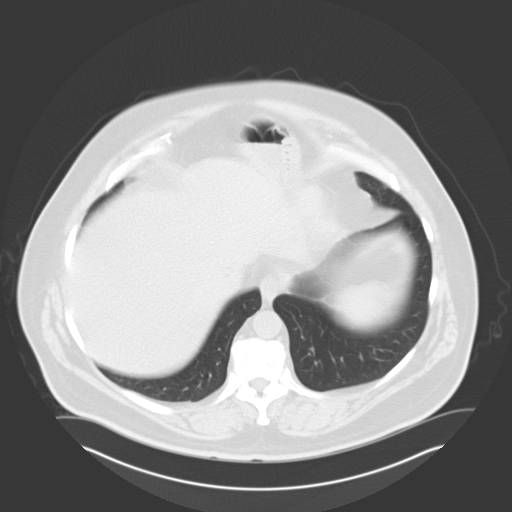
[im 30/34  soft-tissue]
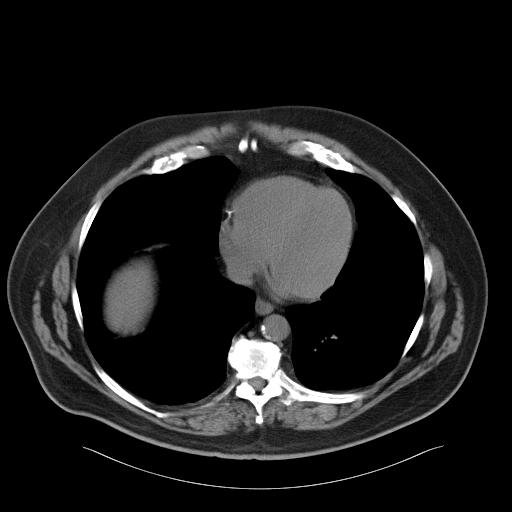
[im 30/34  lung]
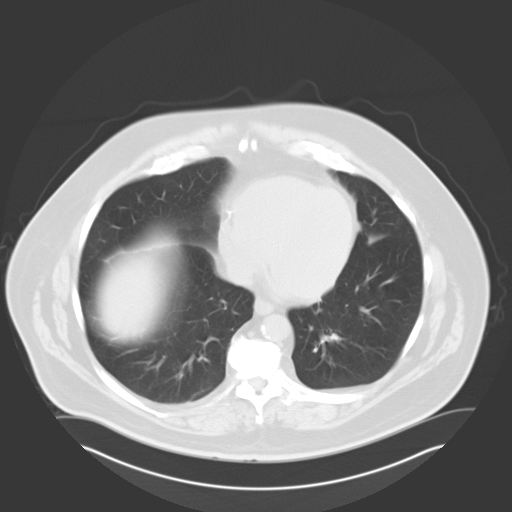

[Series 3: with · axial · 0.95mm/px · z∈[-320,-160]mm · 6 of 34 slices shown]
[im 5/34  soft-tissue]
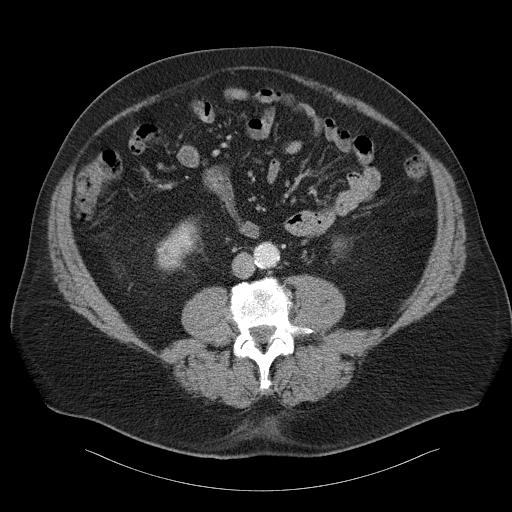
[im 9/34  soft-tissue]
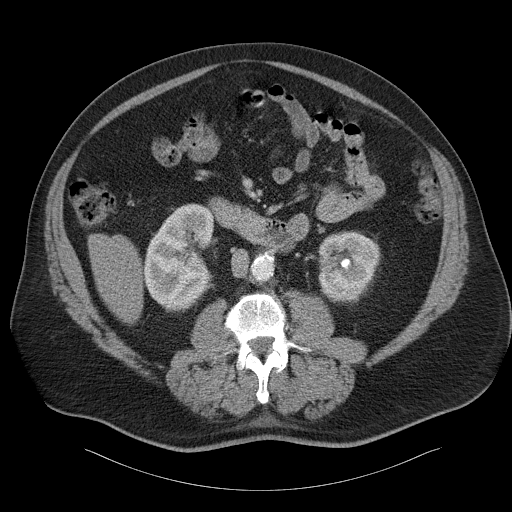
[im 13/34  soft-tissue]
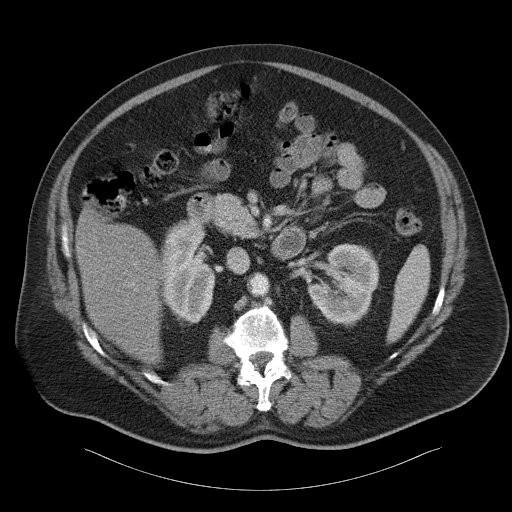
[im 17/34  soft-tissue]
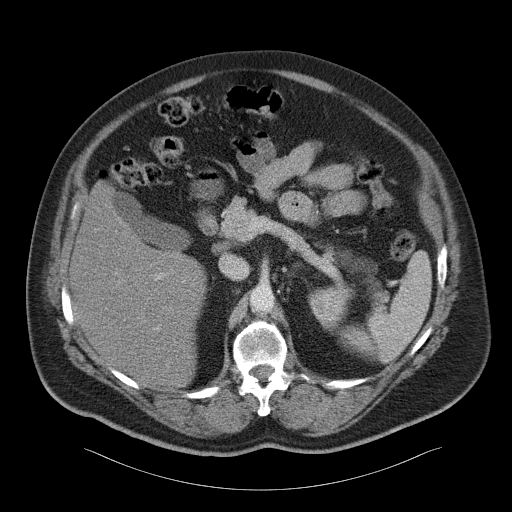
[im 21/34  soft-tissue]
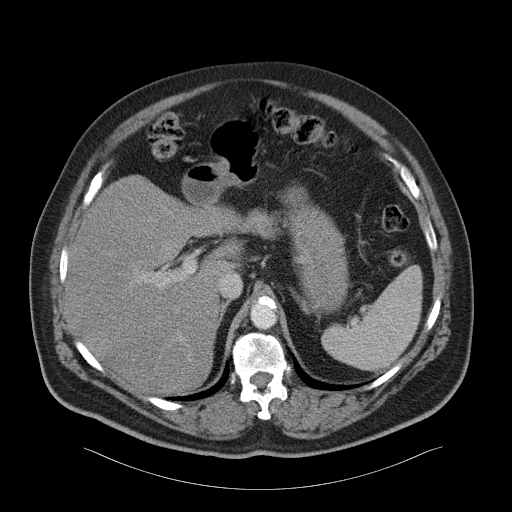
[im 25/34  soft-tissue]
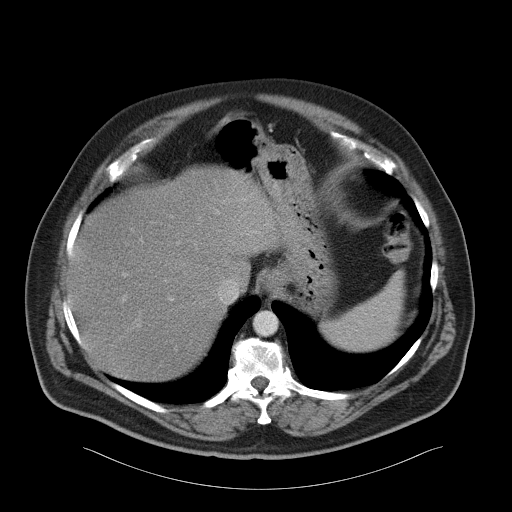

[14 of 32 positions shown; findings below may reference images not displayed]

PROCEDURE:     CT  - CT ABDOMEN STANDARD W/WO  - [DATE]  [DATE]

RESULT:     A triphasic scan was performed to allow evaluation of a known
mass in the RIGHT kidney.

Comparison is made to a prior study of [DATE]. On the RIGHT, there is a
persistent, slightly hyper dense focus involving the posterior aspect of the
lower pole of the RIGHT kidney. It is exophytic and measures approximately
1.3 cm transversely x approximately 1.3 cm AP. Hounsfield measurement
precontrast is 73, 57 immediately post contrast, and 63 on delayed images.
This is most compatible with a hyper dense cyst. I do not see other lesions
involving the RIGHT kidney. On the LEFT, exophytic from the anterolateral
aspect of the lower pole, there is a stable, approximately 11.0 mm in
diameter focus with Hounsfield measurements of approximately 0 on all three
phases. Perinephric fat is normal in density bilaterally. There are
collecting system calcifications which are non-obstructing on the LEFT which
appear stable.

The liver, gallbladder, stomach, spleen, pancreas and adrenal glands are
normal in appearance. There is failure to taper of the caliber of the
abdominal aorta with maximal measured AP dimension of approximately 2.9 cm
with maximum measured transverse dimension of 2.3 cm. The unopacified loops
of small and large bowel are normal in appearance. The lung bases are clear.
IMPRESSION: 1.  There is stable hypodensities associated with the lower poles of both
kidneys. On the RIGHT, the findings are consistent with a hyperdense cyst
and on the LEFT a more normal Hounsfield measurement in the range of 0 was
seen typical of a benign cyst.
2.  There is a stable, infrarenal abdominal aortic aneurysm with maximal
measured dimension of 2.9 cm.
3.  There are calcifications in the LEFT kidney not producing obstruction.

## 2007-04-30 ENCOUNTER — Emergency Department: Payer: Self-pay | Admitting: Emergency Medicine

## 2007-04-30 ENCOUNTER — Other Ambulatory Visit: Payer: Self-pay

## 2007-07-13 ENCOUNTER — Emergency Department: Payer: Self-pay | Admitting: Emergency Medicine

## 2007-07-13 ENCOUNTER — Other Ambulatory Visit: Payer: Self-pay

## 2007-07-13 IMAGING — CT CT ABD-PELV W/ CM
1 of 2 series · 15 of 32 positions shown, 19 images · IV contrast (APPLIED)
Comparison: none

REASON FOR EXAM: (1) iv contrast only; (2) history of AAA, periumbilical
pain x 1 week, worse wit
COMMENTS:

[Series 4: aaa · axial · 0.95mm/px · z∈[-4,+428]mm · 15 of 162 slices shown, 19 images]
[im 12/162  soft-tissue]
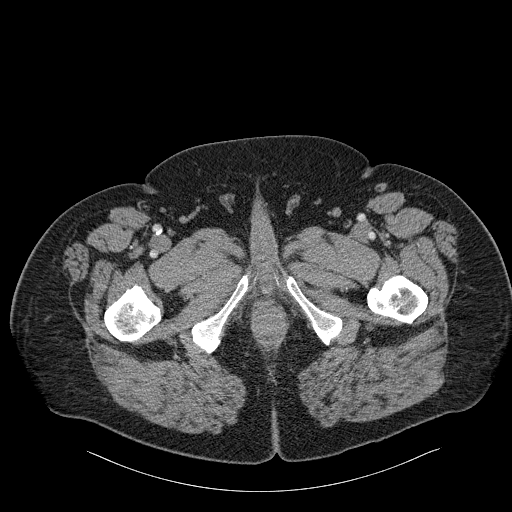
[im 12/162  bone]
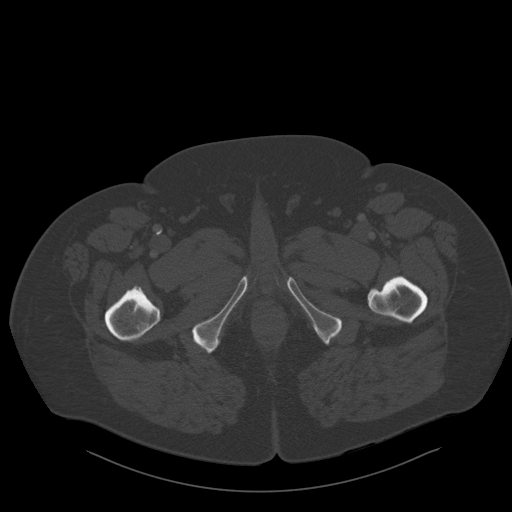
[im 24/162  soft-tissue]
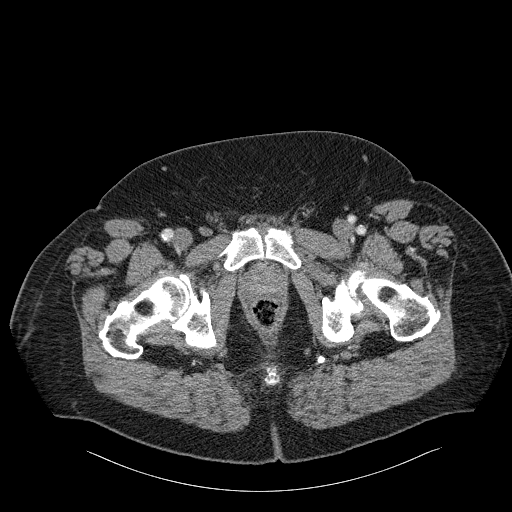
[im 36/162  soft-tissue]
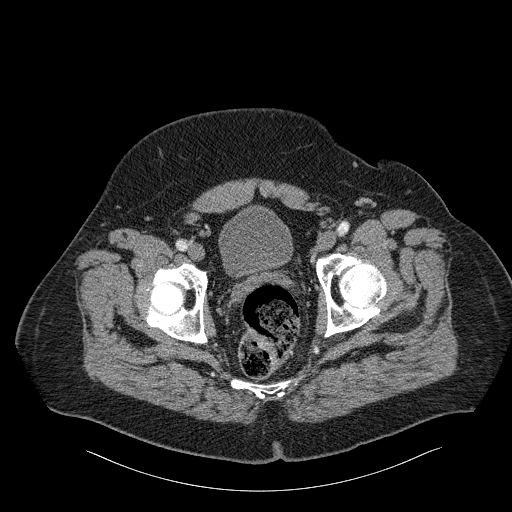
[im 48/162  soft-tissue]
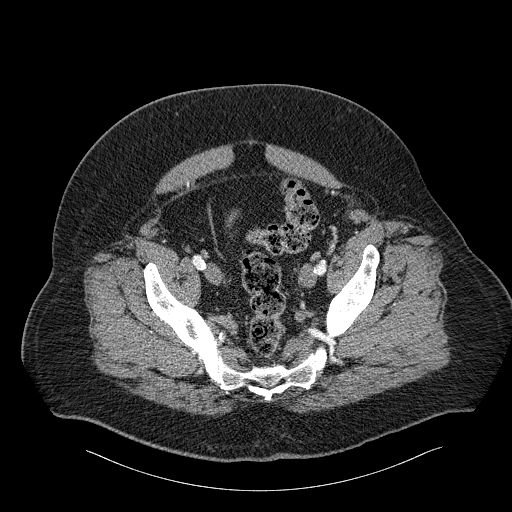
[im 60/162  soft-tissue]
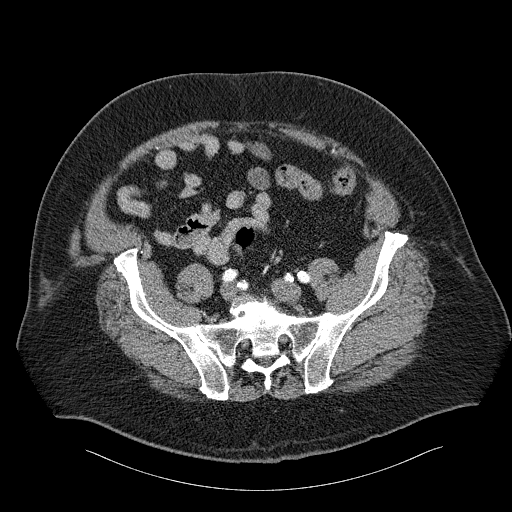
[im 72/162  soft-tissue]
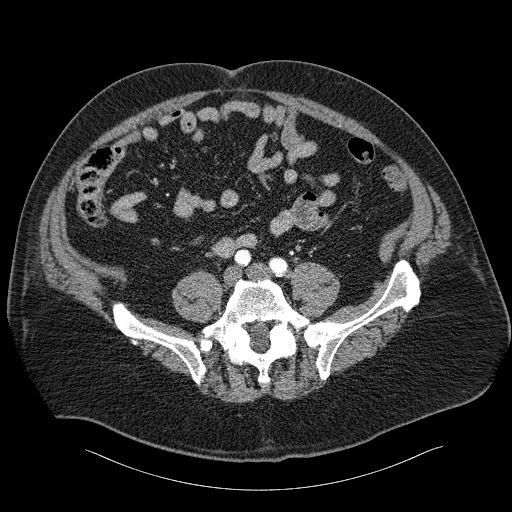
[im 84/162  soft-tissue]
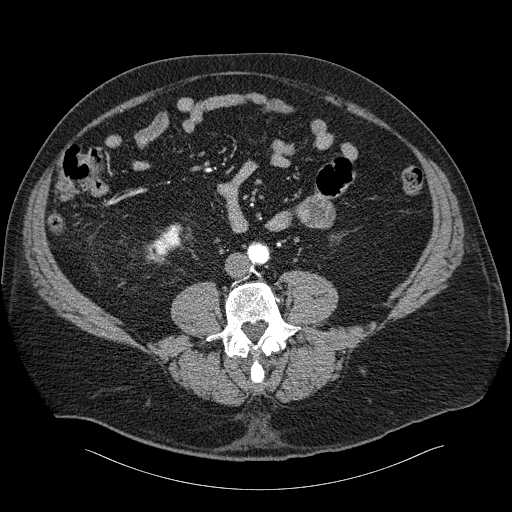
[im 96/162  soft-tissue]
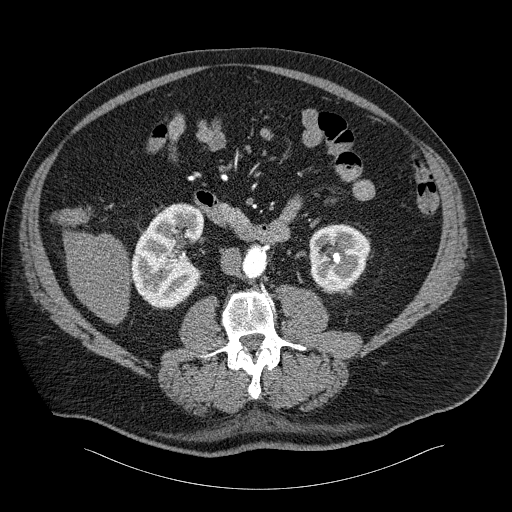
[im 108/162  soft-tissue]
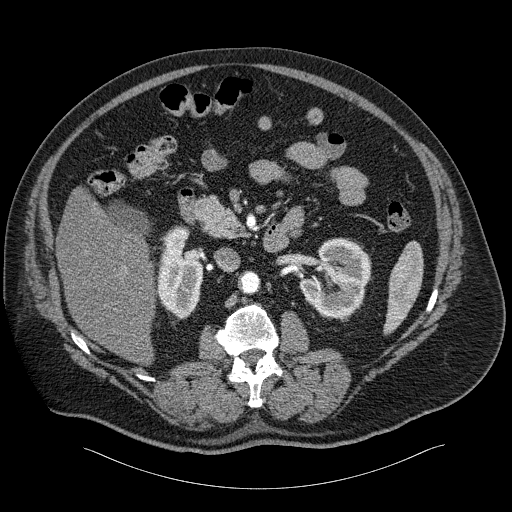
[im 108/162  bone]
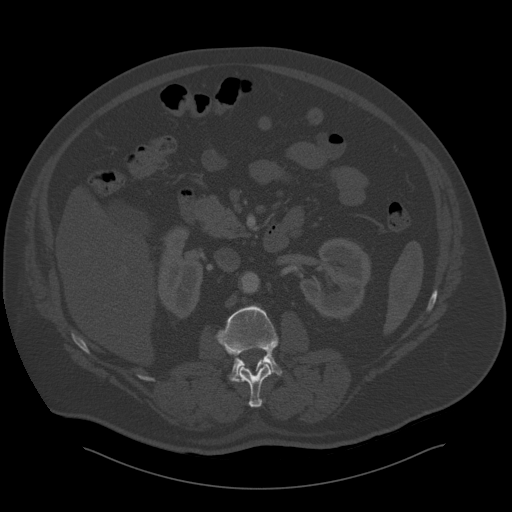
[im 120/162  soft-tissue]
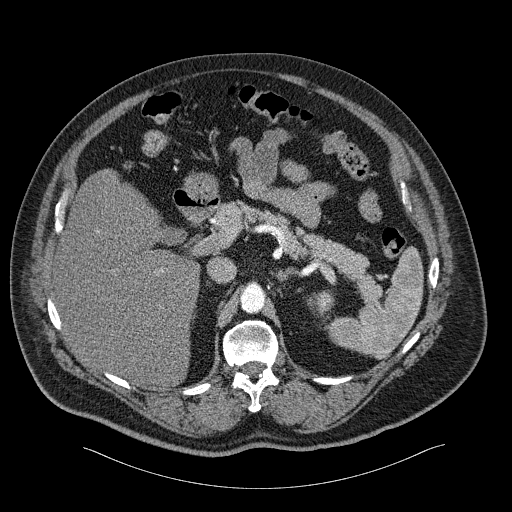
[im 132/162  soft-tissue]
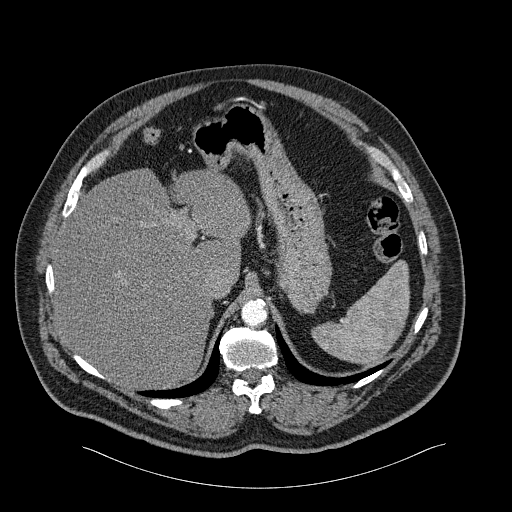
[im 138/162  lung]
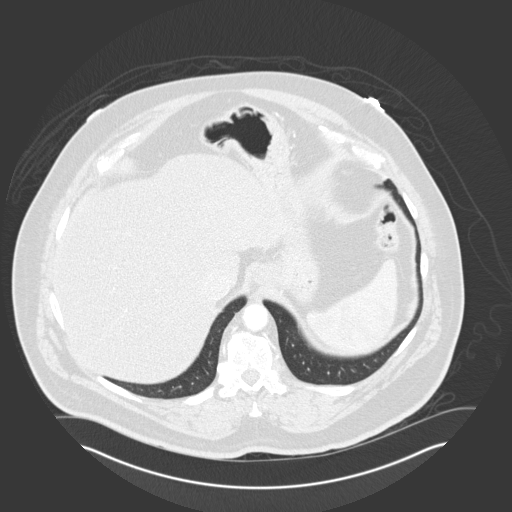
[im 144/162  soft-tissue]
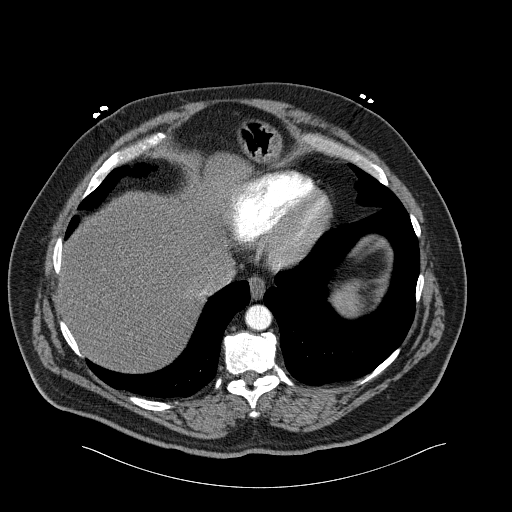
[im 144/162  lung]
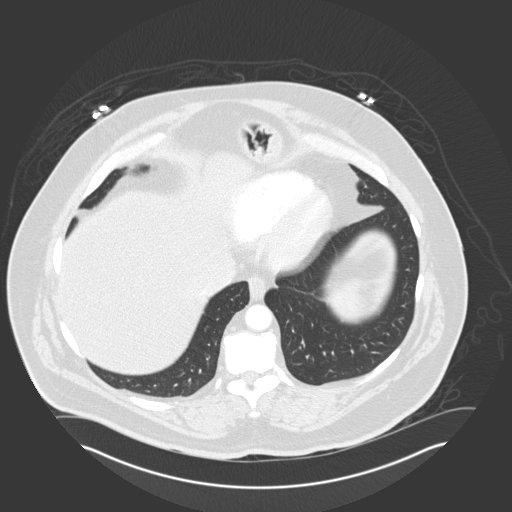
[im 150/162  lung]
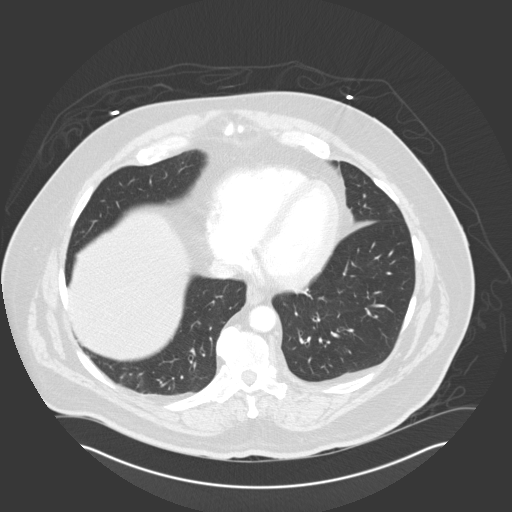
[im 156/162  soft-tissue]
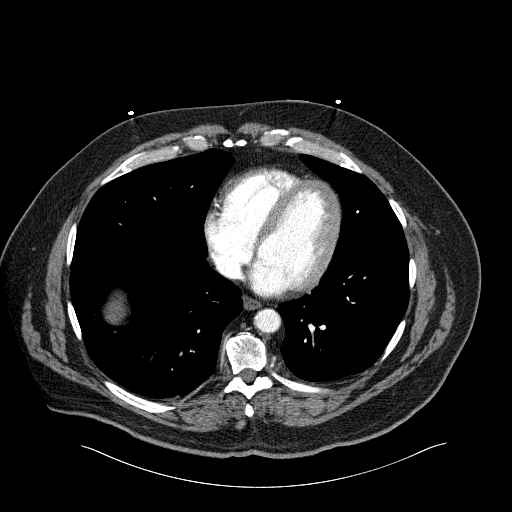
[im 156/162  lung]
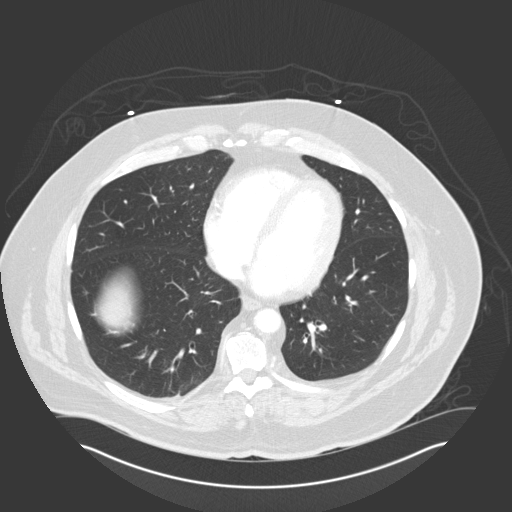

[15 of 32 positions shown; findings below may reference images not displayed]

PROCEDURE:     CT  - CT ABDOMEN / PELVIS  W  - [DATE] [DATE]

RESULT:     Helical 3 mm sections were obtained from the lung bases through
the pubic symphysis status post intravenous administration of 100 ml of
[LQ].

Evaluation of the lung bases demonstrates no gross abnormalities. The liver,
spleen, adrenals and pancreas are unremarkable. Evaluation of the kidneys
demonstrates no evidence of hydronephrosis. Small low attenuating foci are
demonstrated within the RIGHT and LEFT kidney which when compared to a
previous study dated [DATE] appears stable likely representing small
cysts. The previously described infrarenal abdominal aortic aneurysm is
stable and when compared to previous study with technical variation into
consideration measures 2.67 x 2.73 cm AP by transverse dimensions.  The
celiac, SMA, IMA, portal vein and SMV are opacified. There does not appear
to be CT evidence of bowel obstruction, diverticulitis, colitis, enteritis
nor appendicitis. There is no evidence of abdominal or pelvic masses, free
fluid, drainable loculated fluid collections nor adenopathy. A stable
nonobstructing calculus is identified within the LEFT kidney.
IMPRESSION: 1.Stable infrarenal abdominal aortic aneurysm without evidence of focal or
acute intraabdominal or intrapelvic abnormalities.

Dr. STONE of the Emergency Room was informed of these findings at the
time of the initial interpretation.

## 2007-10-13 ENCOUNTER — Ambulatory Visit: Payer: Self-pay | Admitting: Urology

## 2007-10-13 IMAGING — US ULTRASOUND AORTA
1 series · 17 of 23 positions shown · non-contrast
Comparison: none

REASON FOR EXAM: Bilateral hydronephrosis, renal mass
COMMENTS:

[Series 1: ultrasound aorta · 17 of 23 slices shown]
[im 1/23]
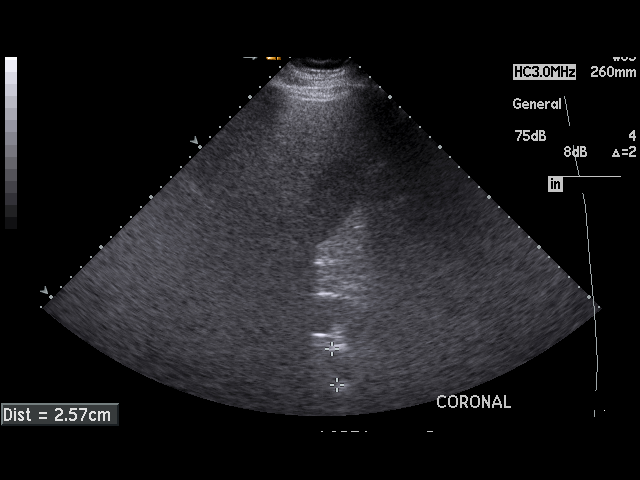
[im 3/23]
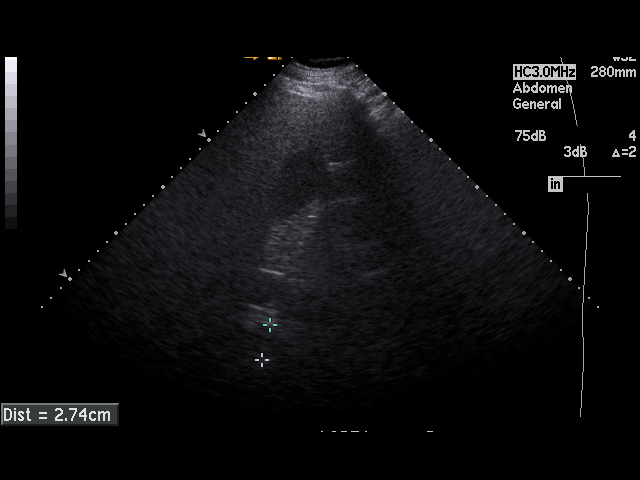
[im 4/23]
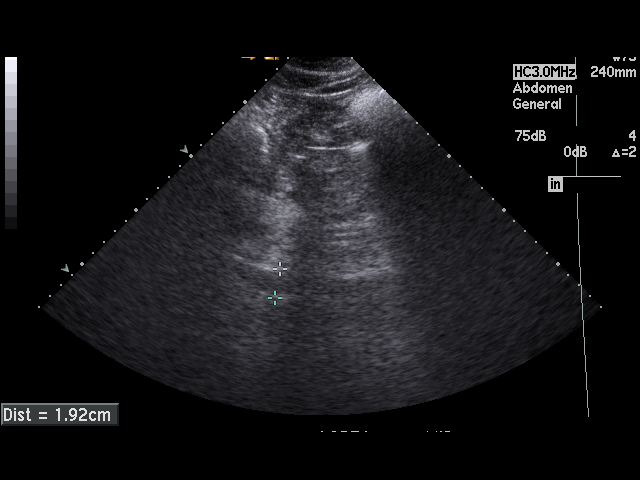
[im 5/23]
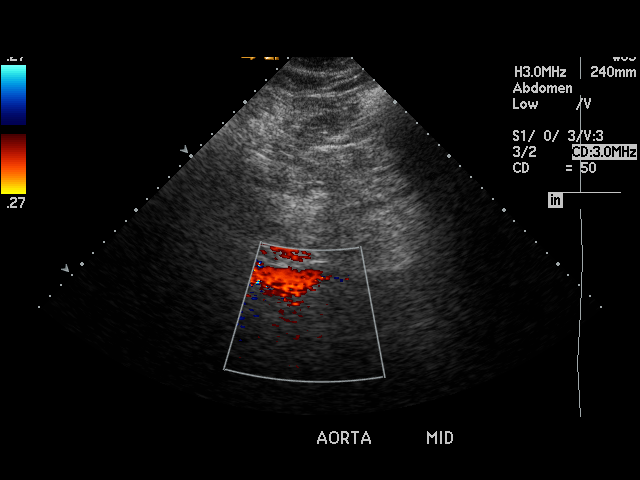
[im 7/23]
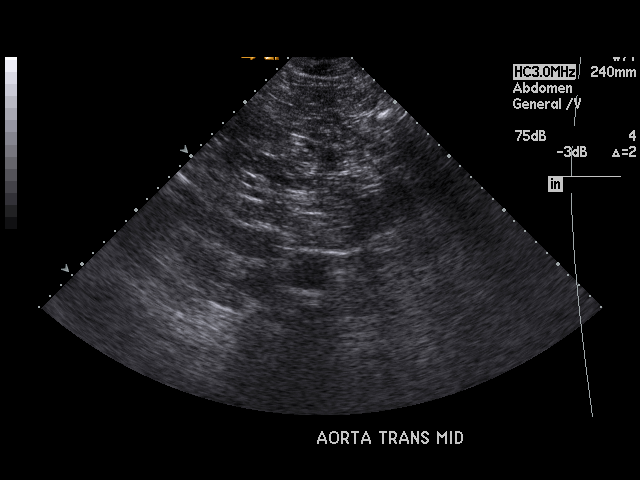
[im 8/23]
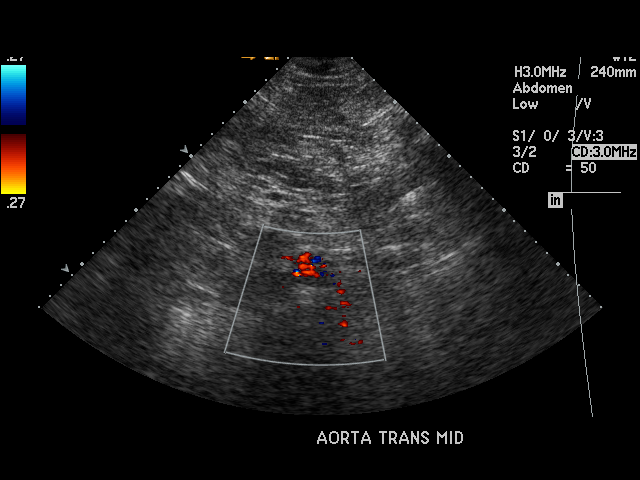
[im 9/23]
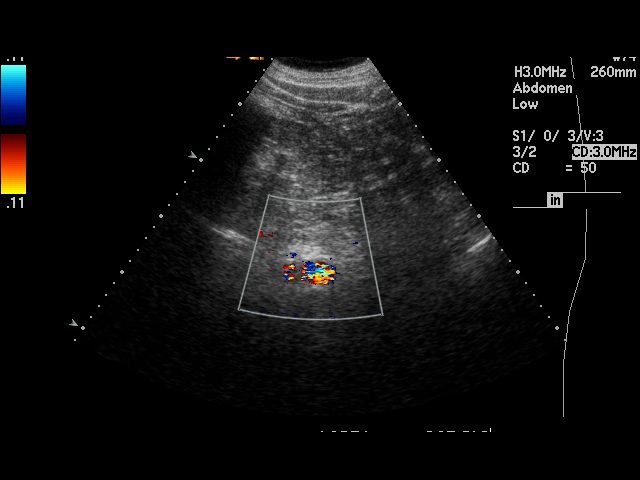
[im 11/23]
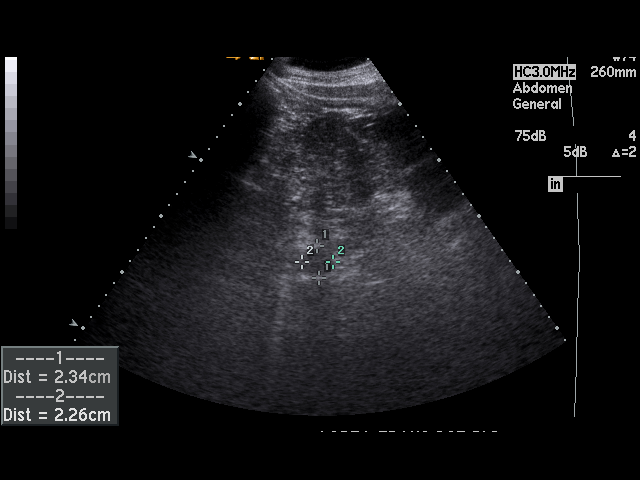
[im 12/23]
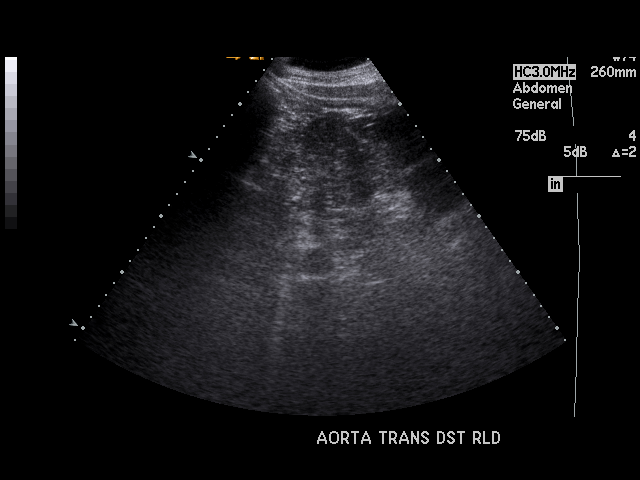
[im 13/23]
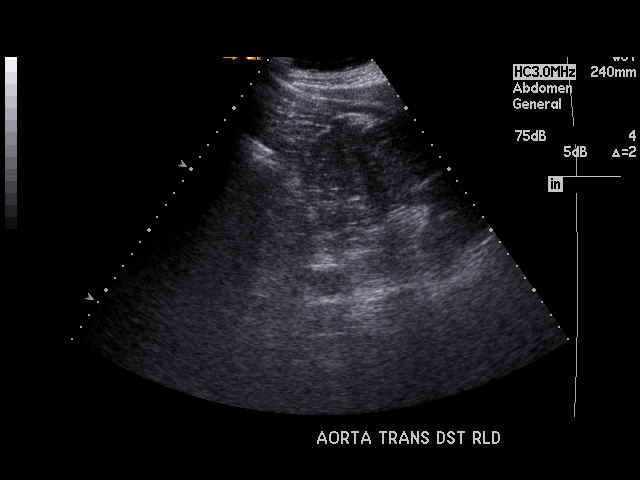
[im 15/23]
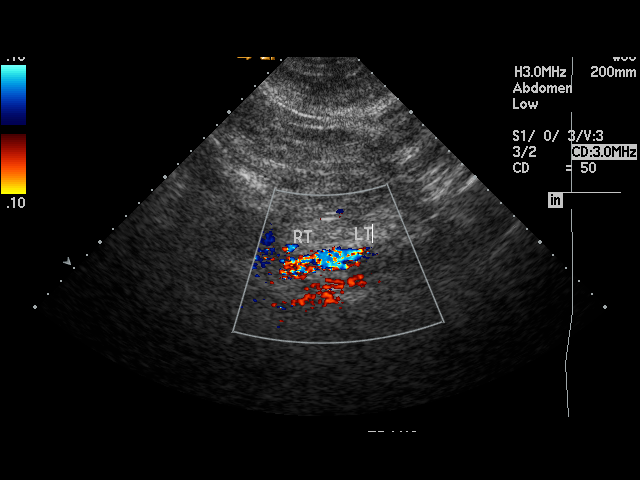
[im 16/23]
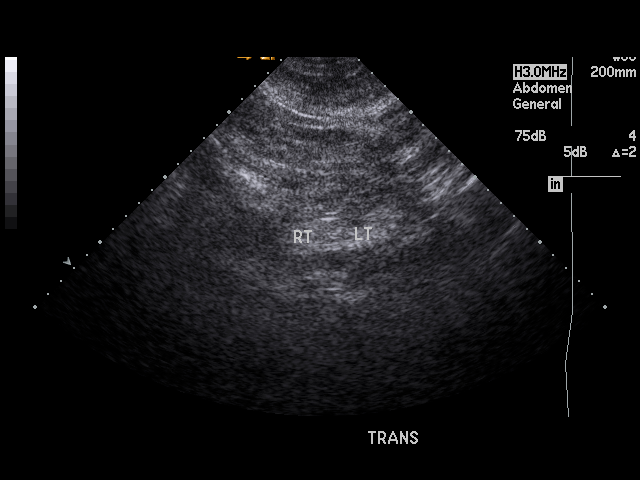
[im 17/23]
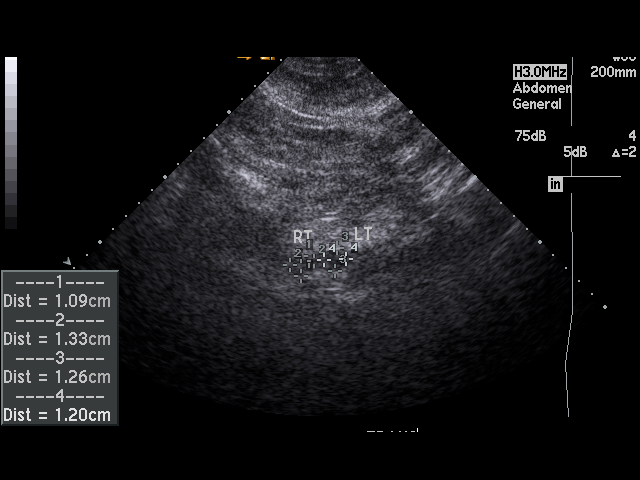
[im 19/23]
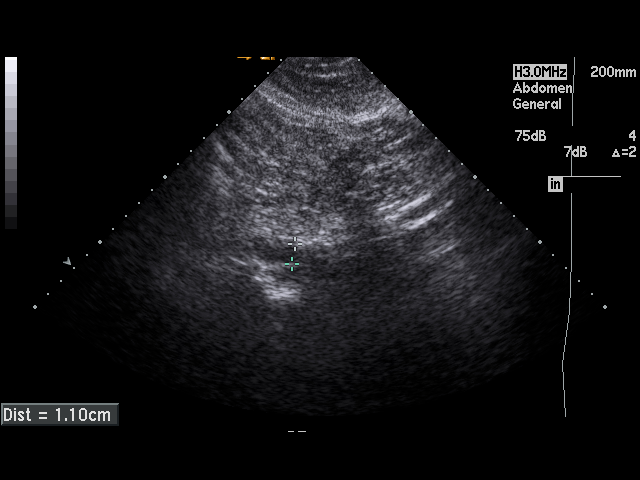
[im 20/23]
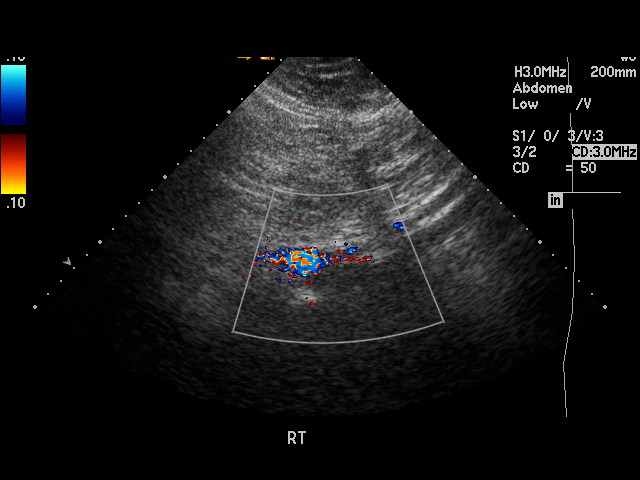
[im 21/23]
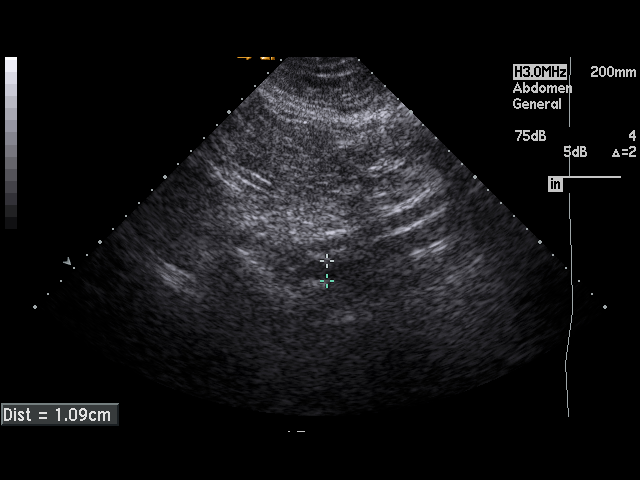
[im 23/23]
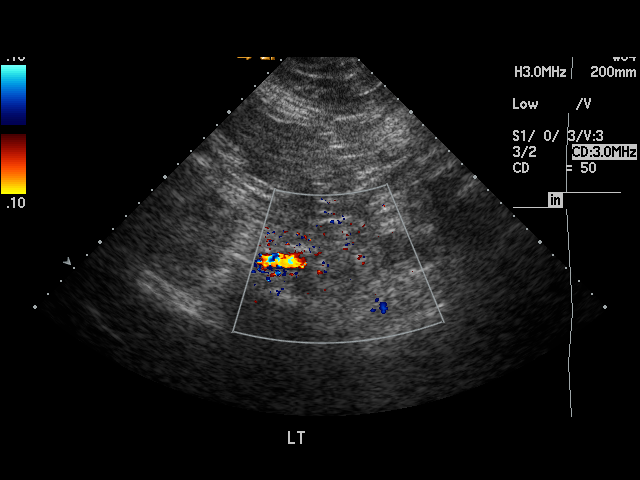

[17 of 23 positions shown; findings below may reference images not displayed]

PROCEDURE:     US  - US AORTA  - [DATE]  [DATE]

RESULT:     The patient had an infrarenal aneurysm visualized on prior CT of
[DATE] at which time the aneurysm measured 2.67 cm x 2.73 cm. The
abdominal aorta is not well seen on this exam and is in great part obscured
by bowel gas. In the transverse plane, the mid abdominal aorta is partially
visualized and the maximum diameter observed is 2.62 cm x 2.62 cm. This is
thought to likely correspond to the area of infrarenal aneurysm formation
noted at CT. The distal abdominal aorta is obscured and is not evaluated on
this exam.
IMPRESSION: Visualization of the abdominal aorta is limited. The area
thought to represent the site of aneurysm formation measures 2.62 cm x
cm. In view of the bowel gas present, the aneurysm could be better evaluated
by CT, if clinically desired.

## 2007-10-13 IMAGING — US US RENAL KIDNEY
1 series · 17 of 25 positions shown · non-contrast
Comparison: none

REASON FOR EXAM: bilateral hydronephrosis  renal mass
COMMENTS:

[Series 1: us renal kidney · 17 of 27 slices shown]
[im 1/27]
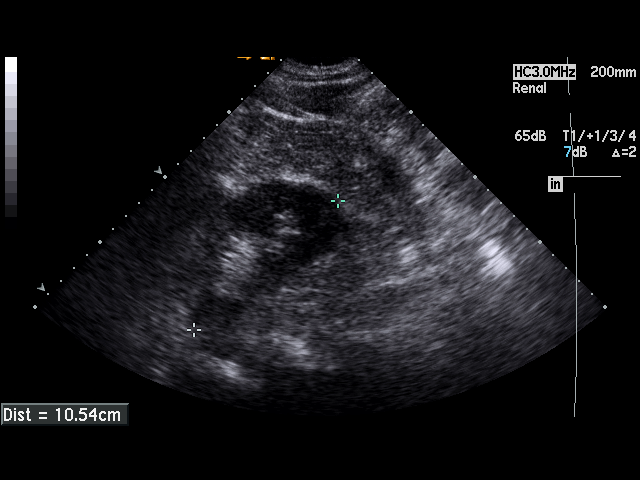
[im 3/27]
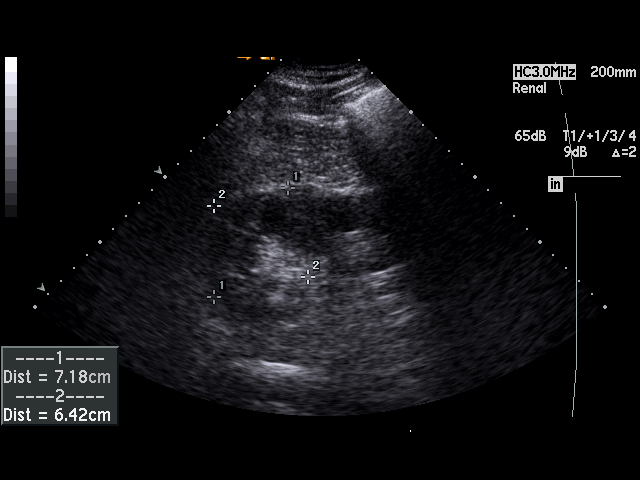
[im 4/27]
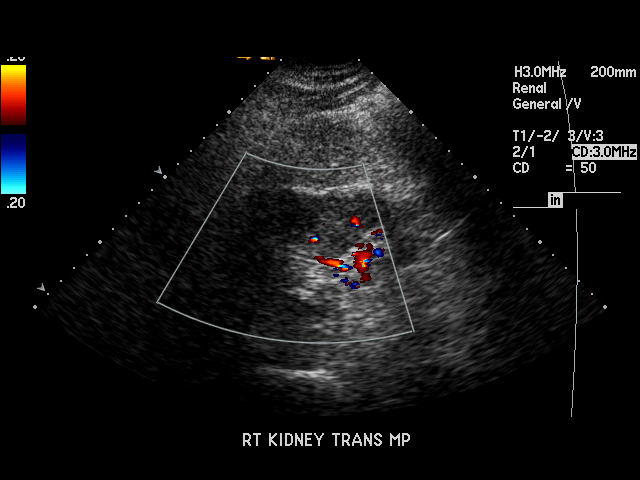
[im 6/27]
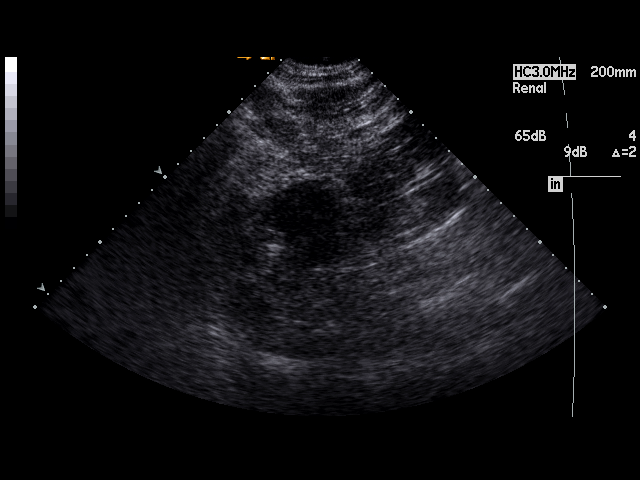
[im 7/27]
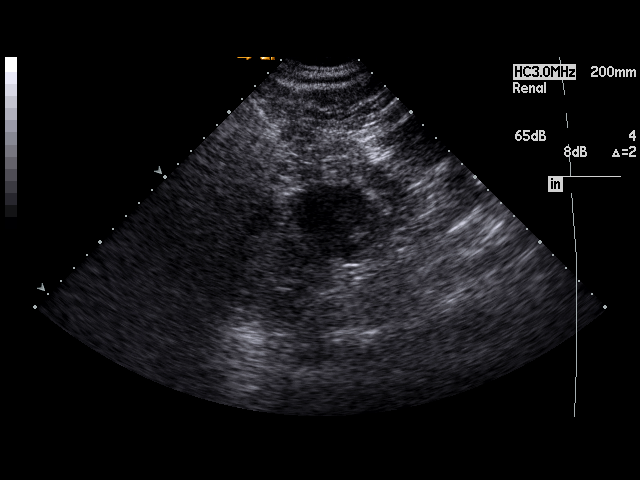
[im 9/27]
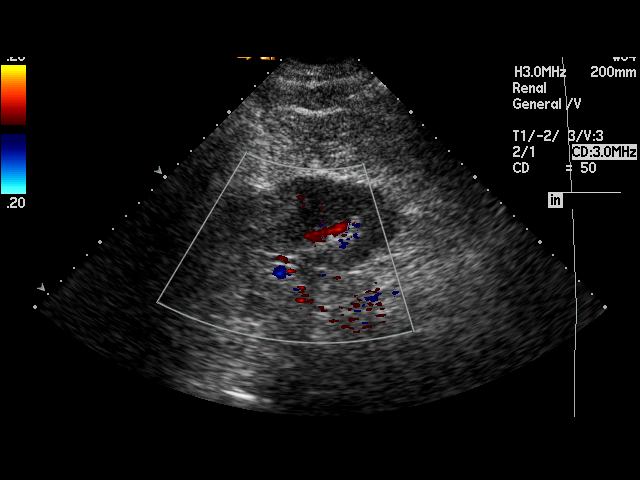
[im 10/27]
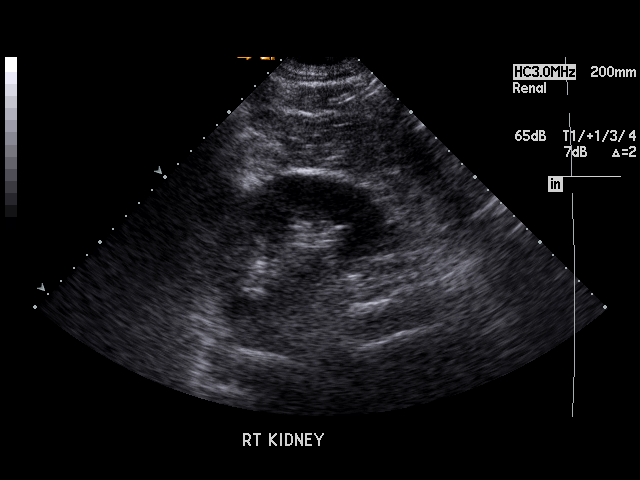
[im 12/27]
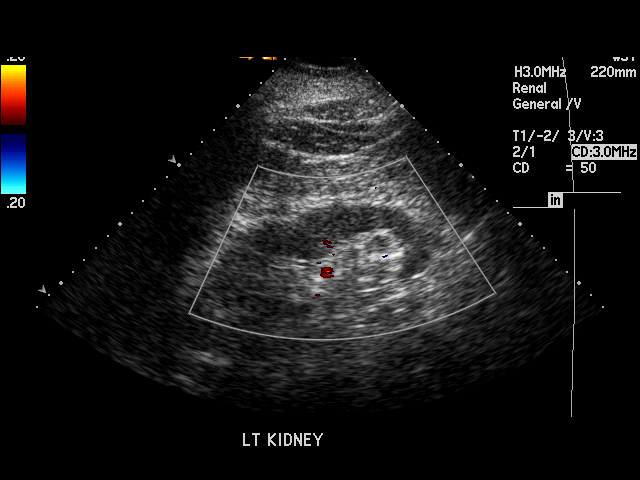
[im 14/27]
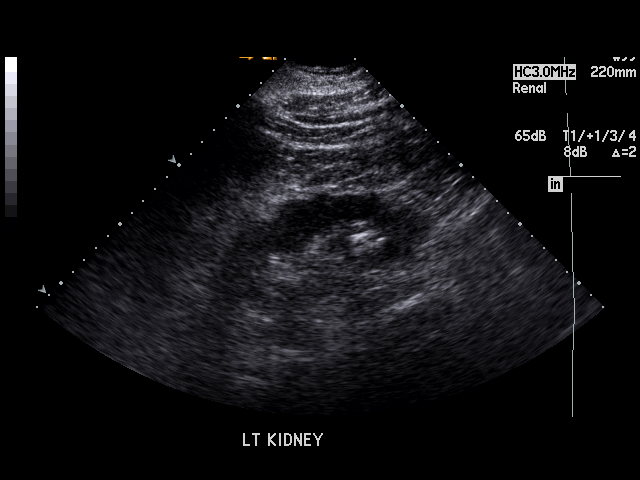
[im 15/27]
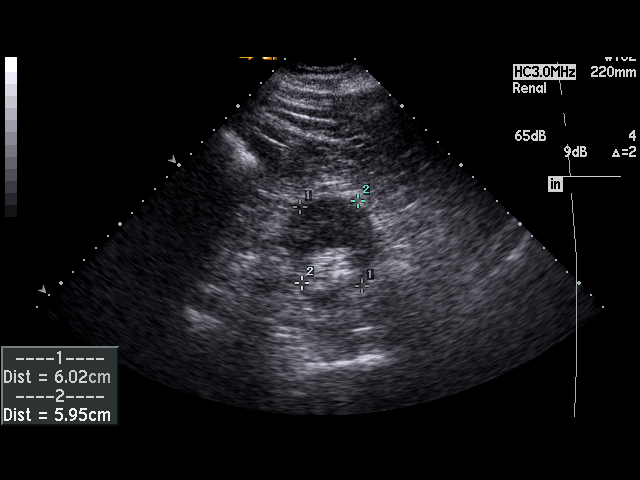
[im 17/27]
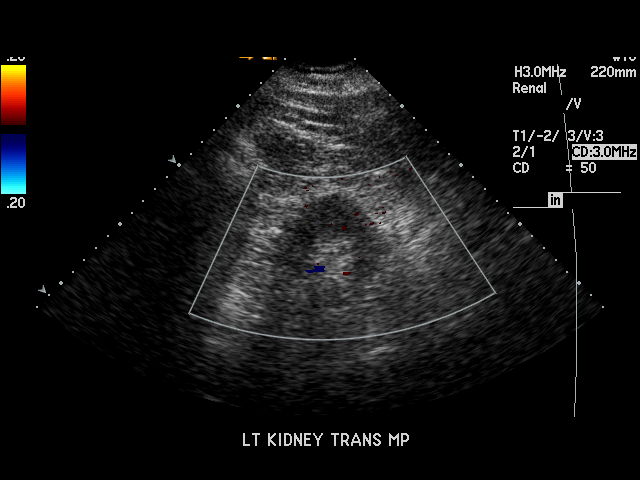
[im 18/27]
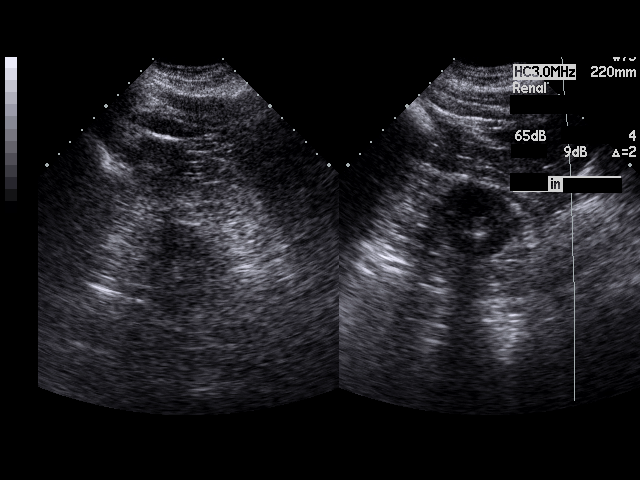
[im 20/27]
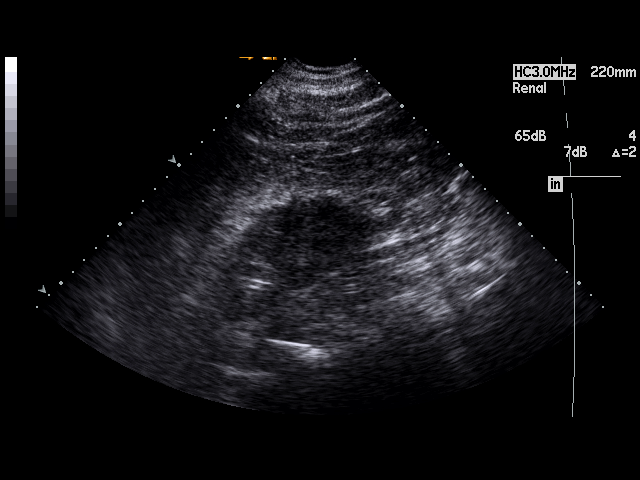
[im 21/27]
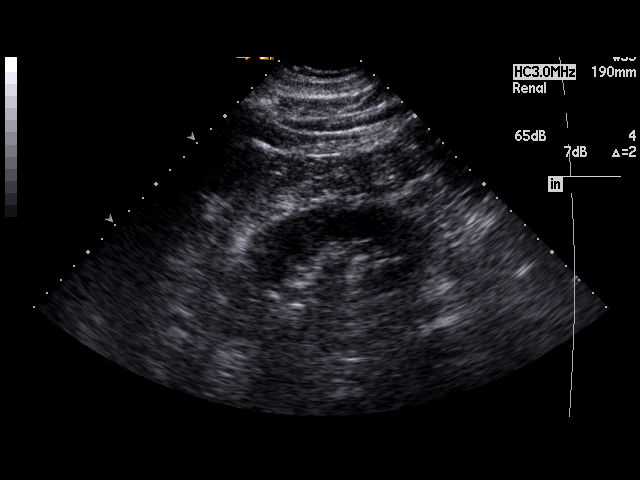
[im 23/27]
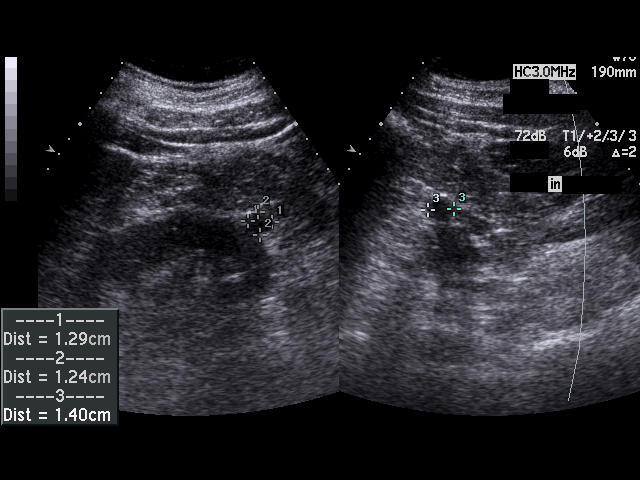
[im 24/27]
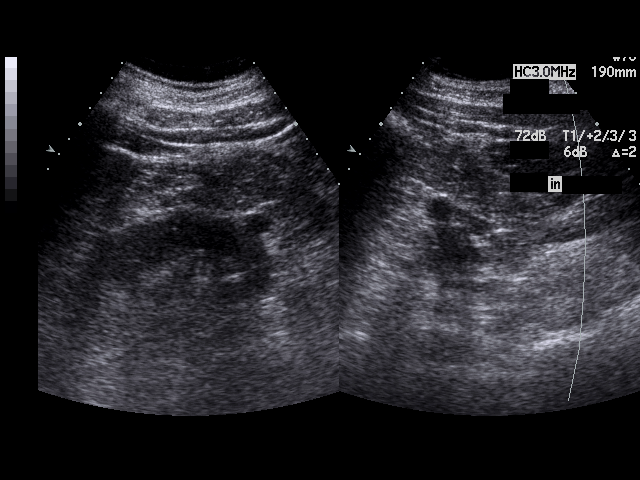
[im 27/27]
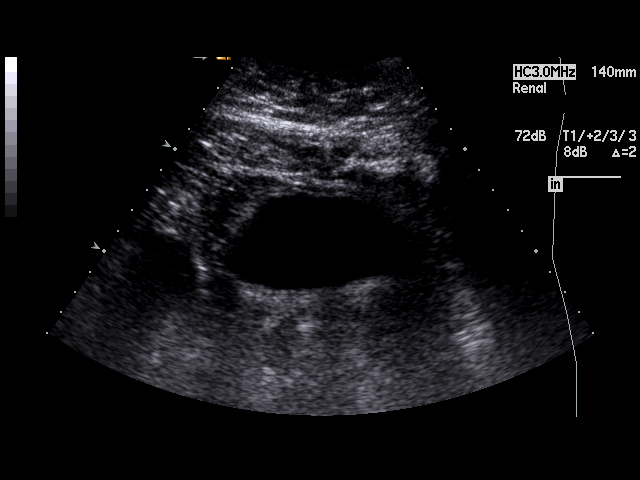

[17 of 25 positions shown; findings below may reference images not displayed]

PROCEDURE:     US  - US KIDNEY  - [DATE]  [DATE]

RESULT:     The RIGHT kidney measures 10.54 cm x 7.18 cm x 6.42 cm and the
LEFT kidney measures 12.55 cm x 6.02 cm x 5.95 cm. There is an exophytic
hypoechoic 1.4 cm mass off the lower pole of the LEFT kidney. Posterior
enhancement is not seen but the structure is anechoic and this likely
represents a small cyst. No other renal mass lesions are seen. There is no
hydronephrosis. There is a 9.2 mm calcification at the lower pole of the
LEFT kidney. The visualized portion of the urinary bladder is normal in
appearance.
IMPRESSION: 1. Probable LEFT renal cyst.
2. There is a nonobstructive lower pole stone on the LEFT.
3. No hydronephrosis is seen.
4. The urinary bladder is normal in appearance.

## 2008-06-22 ENCOUNTER — Ambulatory Visit: Payer: Self-pay | Admitting: Family Medicine

## 2008-06-22 IMAGING — US ULTRASOUND AORTA
1 series · 11 of 11 positions shown · non-contrast
Comparison: none

REASON FOR EXAM: known AAA
COMMENTS:

[Series 1: ultrasound aorta · 11 of 11 slices shown]
[im 1/11]
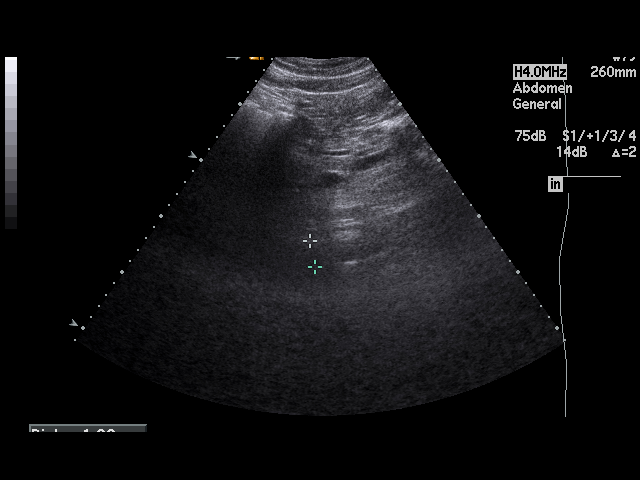
[im 2/11]
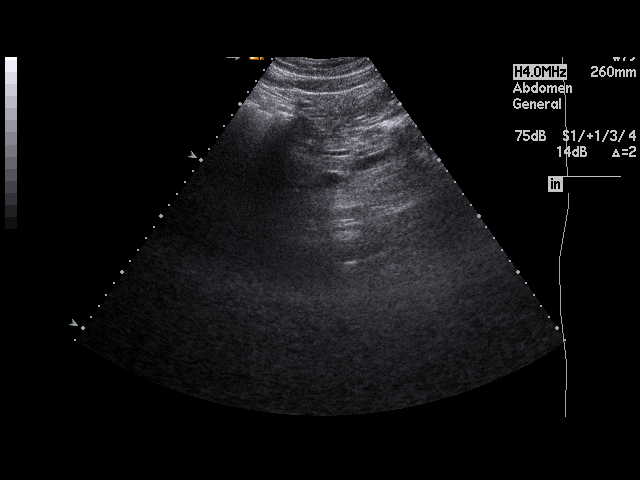
[im 3/11]
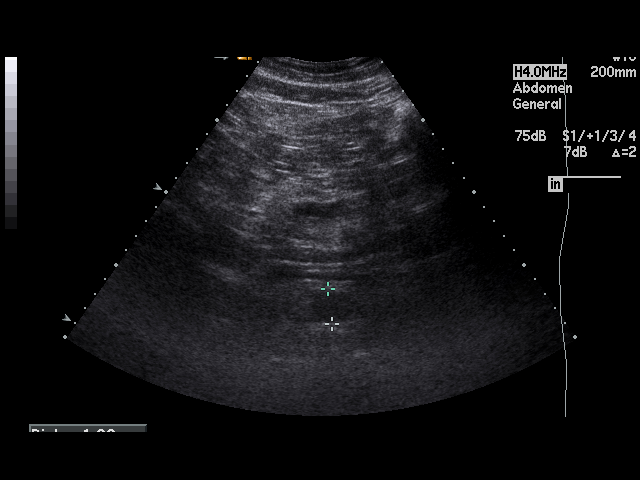
[im 4/11]
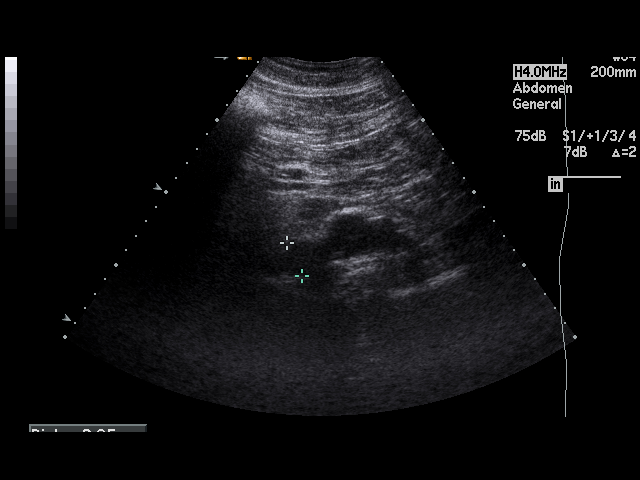
[im 5/11]
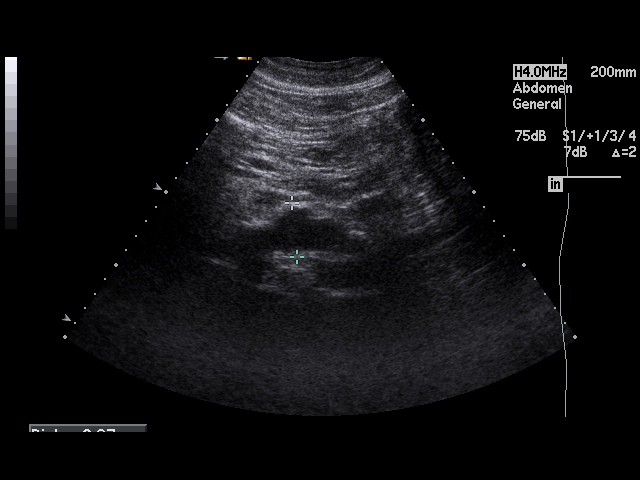
[im 6/11]
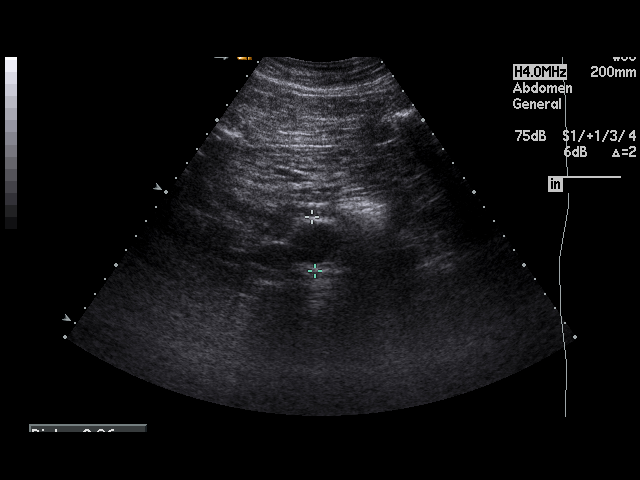
[im 7/11]
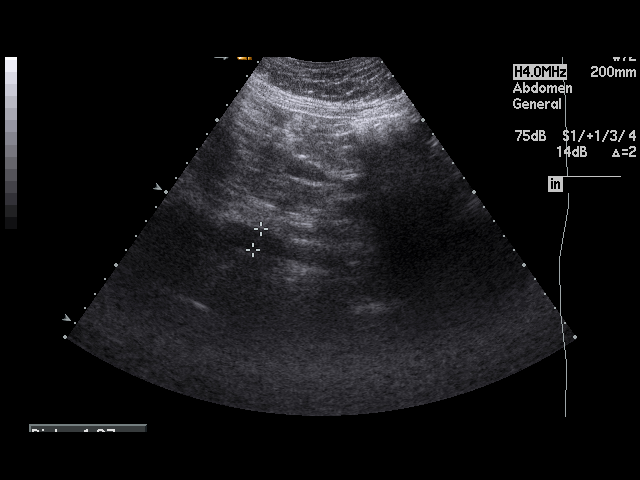
[im 8/11]
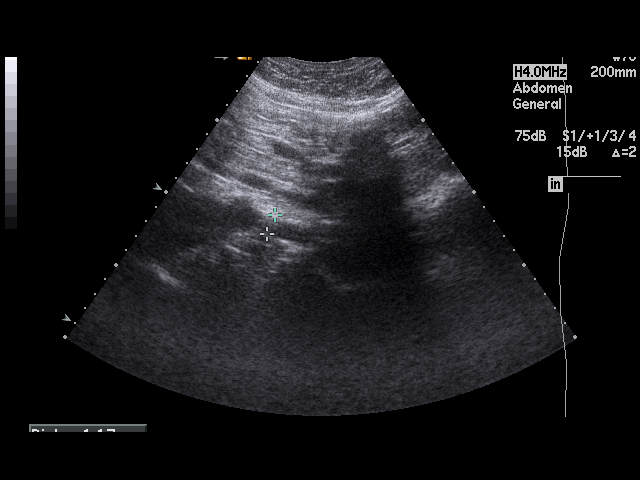
[im 9/11]
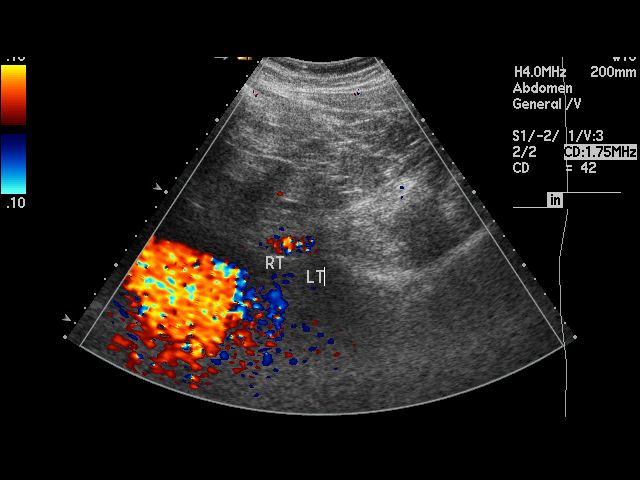
[im 10/11]
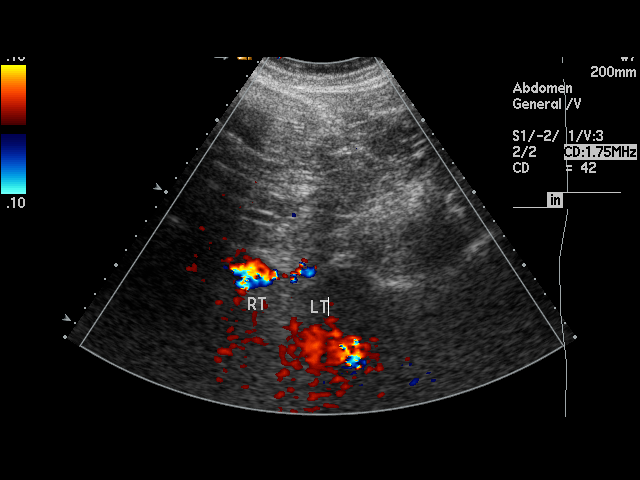
[im 11/11]
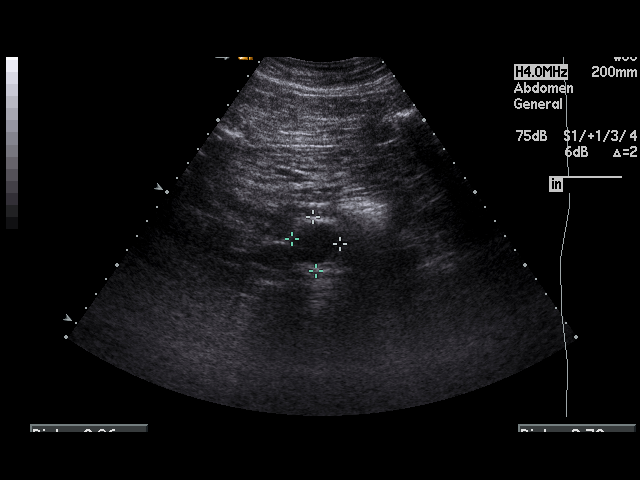

[11 of 11 positions shown; findings below may reference images not displayed]

PROCEDURE:     US  - US AORTA  - [DATE]  [DATE]

RESULT:     On both the current and previous abdominal ultrasound the aorta
has in great part been obscured by bowel gas. A distal abdominal aorta
aneurysm is identified and on this exam measures 3.06 cm x 2.73 cm. The
aneurysm has apparently increased somewhat in size since the prior exam at
which time the aneurysm measured 2.62 cm x 2.62 cm. Additional abnormalities
of the aorta could be present and not detected since the aorta is in great
part obscured. The aneurysm and the reminder of the aorta could be better
followed by CT if clinically indicated. The common iliac arteries are
visualized and appear slightly ectatic.
IMPRESSION: 1.     The examination is limited due to the presence of bowel gas. There is
observed a distal abdominal aorta which measures 3.06 cm x 2.73 cm. The more
proximal aorta is obscured.

## 2008-09-26 ENCOUNTER — Ambulatory Visit: Payer: Self-pay | Admitting: Family Medicine

## 2008-09-26 IMAGING — CT CT ABD-PELV W/ CM
1 of 2 series · 15 of 32 positions shown, 19 images · non-contrast
Comparison: none

REASON FOR EXAM: assess lesions on kidneys  AAA
COMMENTS:

[Series 4: aaa · axial · 0.98mm/px · z∈[-516,-84]mm · 15 of 158 slices shown, 19 images]
[im 7/158  soft-tissue]
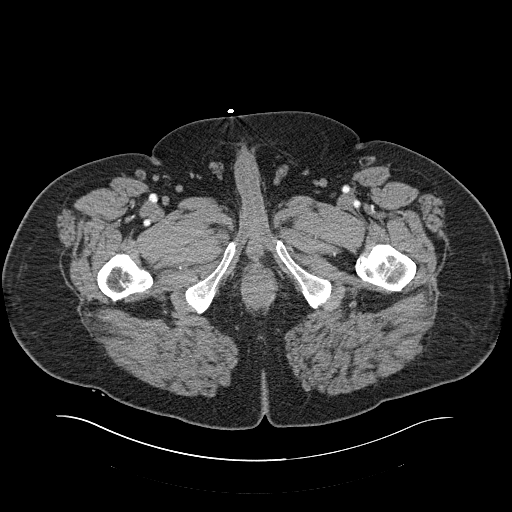
[im 7/158  bone]
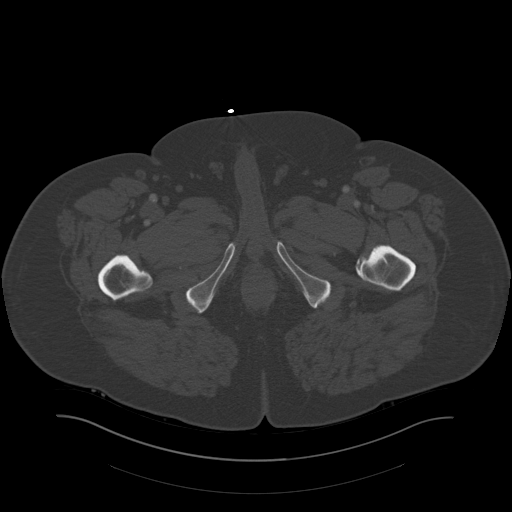
[im 19/158  soft-tissue]
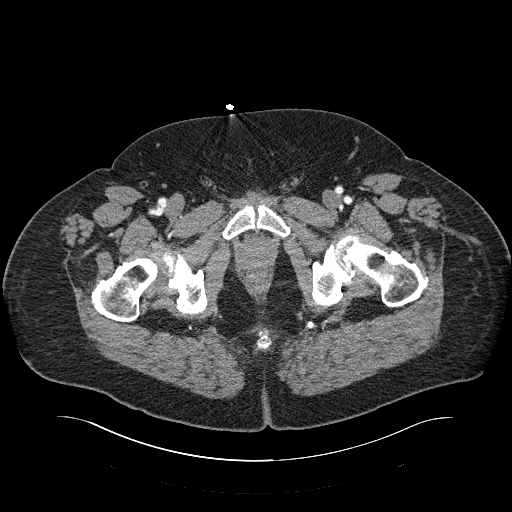
[im 31/158  soft-tissue]
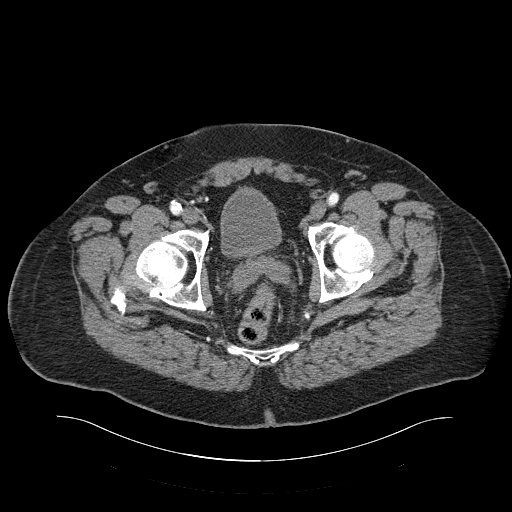
[im 43/158  soft-tissue]
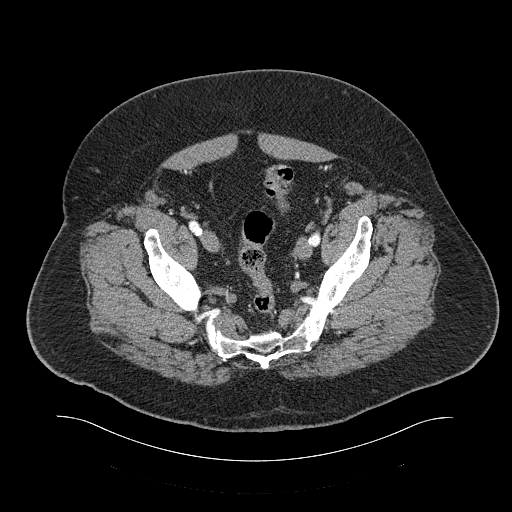
[im 55/158  soft-tissue]
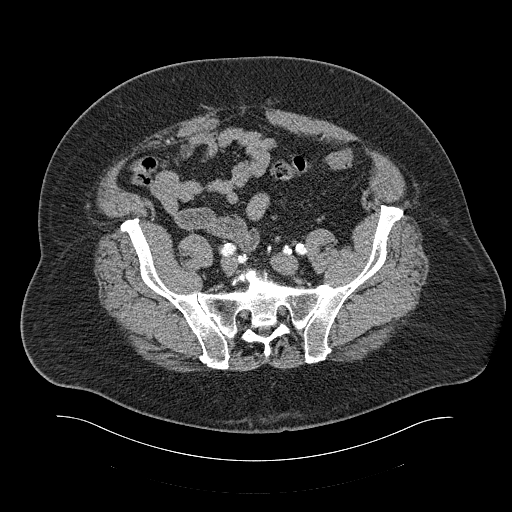
[im 67/158  soft-tissue]
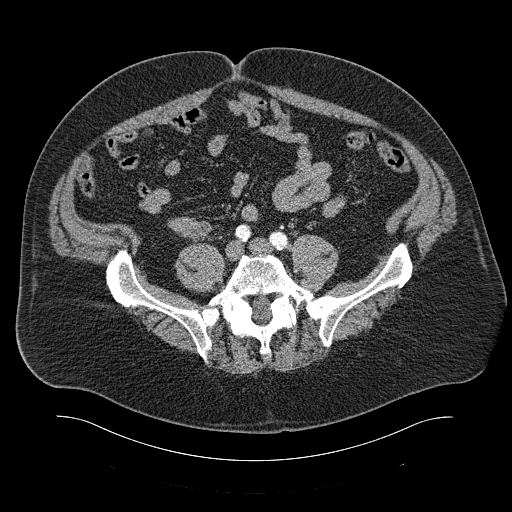
[im 79/158  soft-tissue]
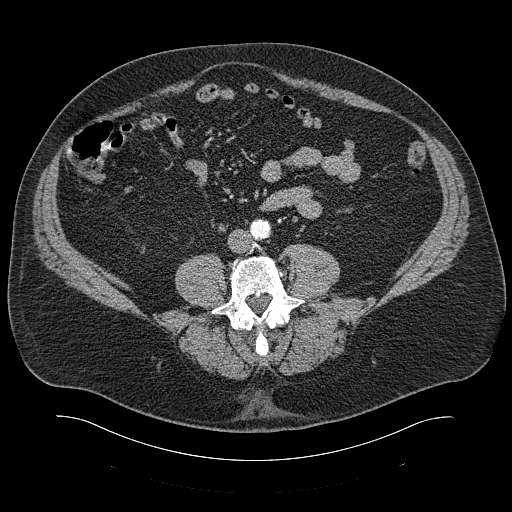
[im 91/158  soft-tissue]
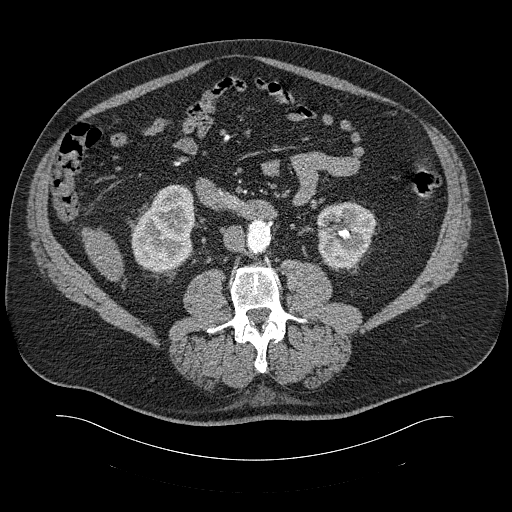
[im 103/158  soft-tissue]
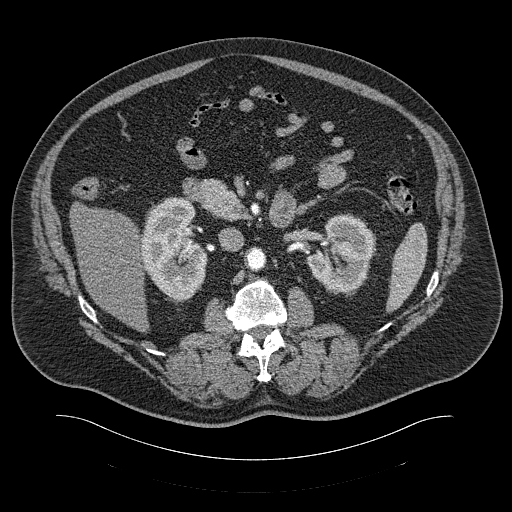
[im 103/158  bone]
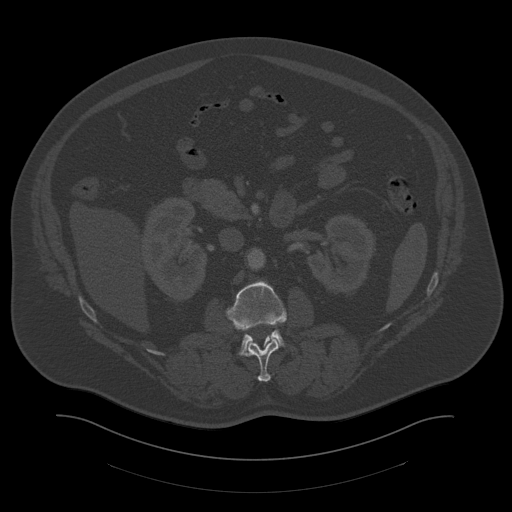
[im 115/158  soft-tissue]
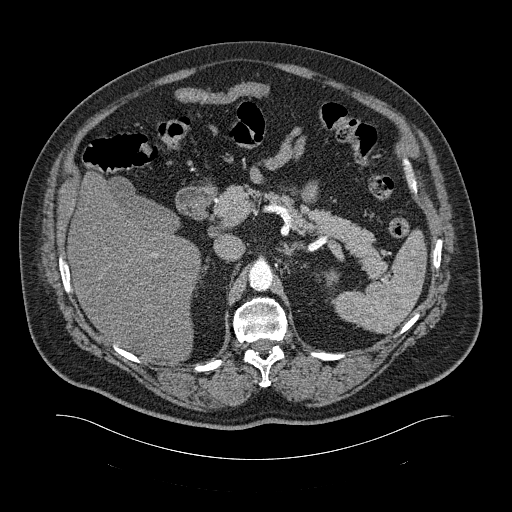
[im 127/158  soft-tissue]
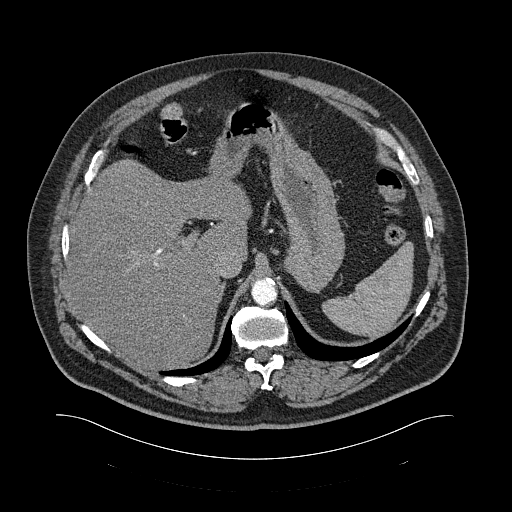
[im 133/158  lung]
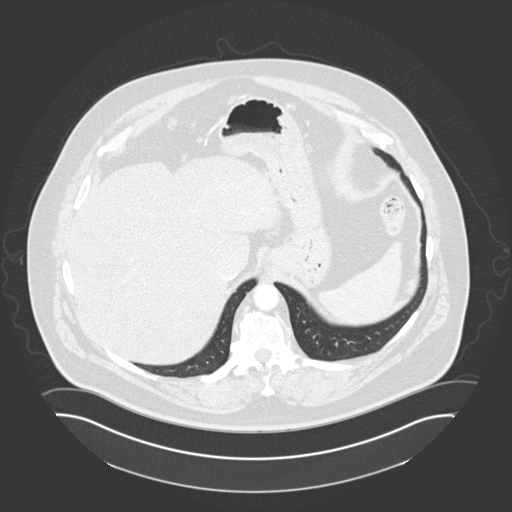
[im 139/158  soft-tissue]
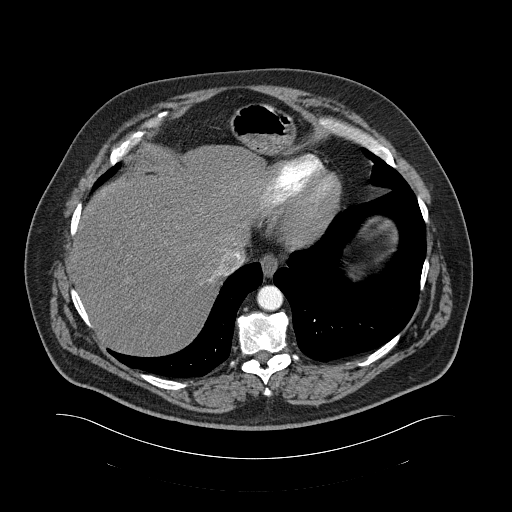
[im 139/158  lung]
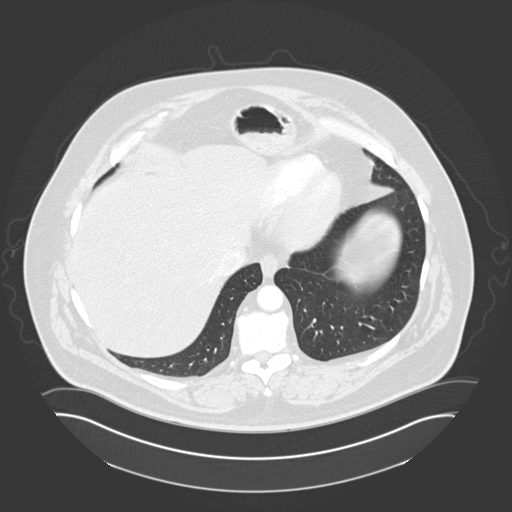
[im 145/158  lung]
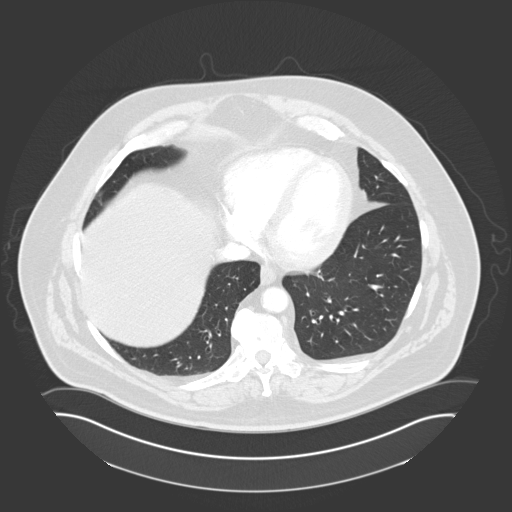
[im 151/158  soft-tissue]
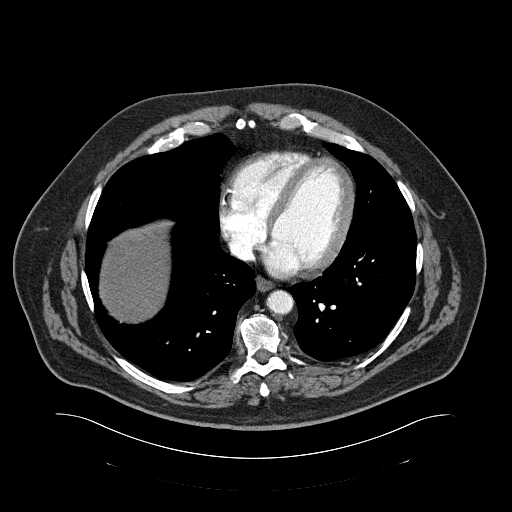
[im 151/158  lung]
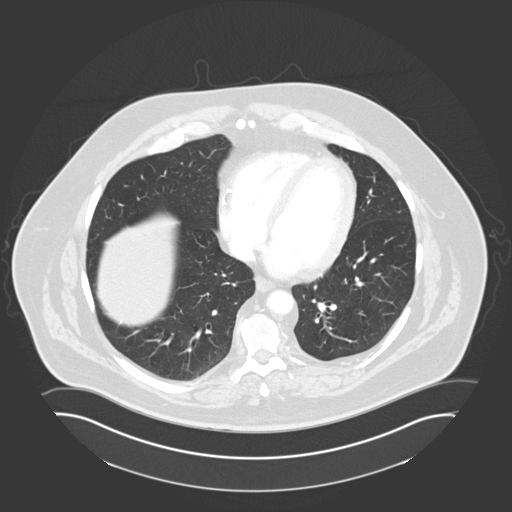

[15 of 32 positions shown; findings below may reference images not displayed]

PROCEDURE:     CT  - CT ABDOMEN / PELVIS  W  - [DATE]  [DATE]

RESULT:     CT of the abdomen and pelvis is performed utilizing
approximately 100 mL of [K0] iodinated intravenous contrast. No oral
contrast was administered for this examination. Images are reconstructed in
the axial plane at 3 mm slice thickness. Comparison is made to the study
dated [DATE].

Images demonstrate mild aneurysmal dilatation of the distal abdominal aorta
with a maximum AP dimension measuring approximately 2.98 cm with a
transverse dimension of approximately 2.63 cm. Large stones are seen in the
LEFT renal collecting system in the lower pole and mid pole regions. There
is no evidence of hydronephrosis. Tiny exophytic low-attenuation area is
seen involving the lower pole of the LEFT kidney on image 76. This measures
approximately 12 mm in diameter and is essentially unchanged. A similar
lesion is seen posteromedially from the lower pole the RIGHT kidney and
measures as much as 1.7 cm in diameter and also appears unchanged. No
radiopaque gallstones are evident. The liver, spleen, pancreas and adrenal
glands appear to be within normal limits. Atherosclerotic changes are
present. Images through the base of the lungs demonstrate some minimal
fibrotic or atelectatic changes in the lingula and LEFT lower lobe but no
mass or nodule is evident. There is no edema, infiltrate or effusion.
Degenerative changes are noted in the lumbosacral region with multilevel
hypertrophic endplate spurring present. The spinal alignment is normal. The
urinary bladder is unremarkable. The prostate is enlarged and contains some
tiny punctate calcifications. There is no abnormal bowel distention or bowel
wall thickening. There is no ascites or free air.
IMPRESSION: 1. Stable appearing abdominal aortic aneurysm. There is no iliac
involvement. There is no evidence of hemorrhage.
2. Stable bilateral renal lesions which are nonspecific especially on a
single phase study.
3. LEFT renal calculi. The largest stones appear to measure as much as 10 mm
in length.

## 2009-04-26 ENCOUNTER — Ambulatory Visit: Payer: Self-pay | Admitting: Family Medicine

## 2009-04-26 IMAGING — US ULTRASOUND AORTA
1 series · 17 of 19 positions shown · non-contrast
Comparison: none

REASON FOR EXAM: KNOWN AAA  kidney lesions
COMMENTS:

[Series 1: ultrasound aorta · 17 of 19 slices shown]
[im 1/19]
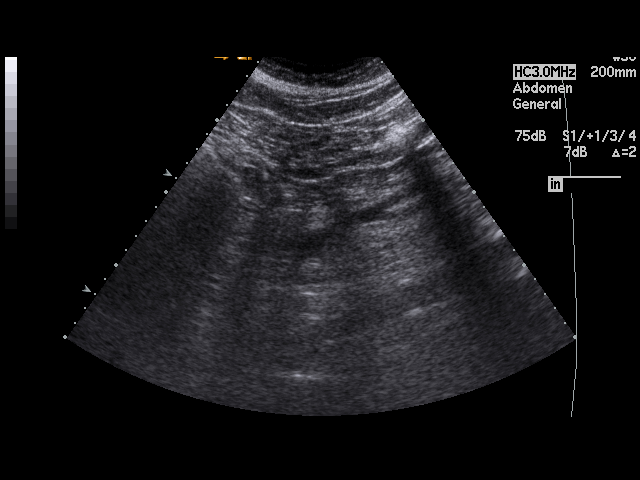
[im 2/19]
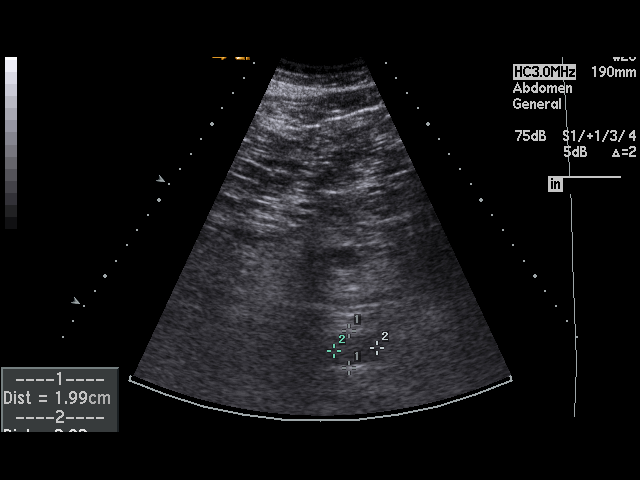
[im 3/19]
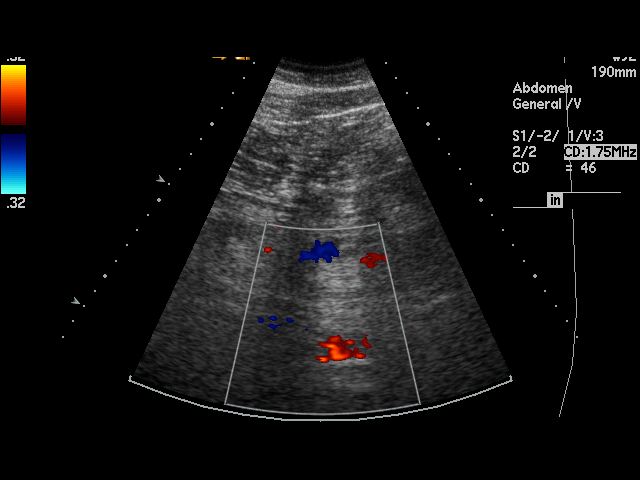
[im 4/19]
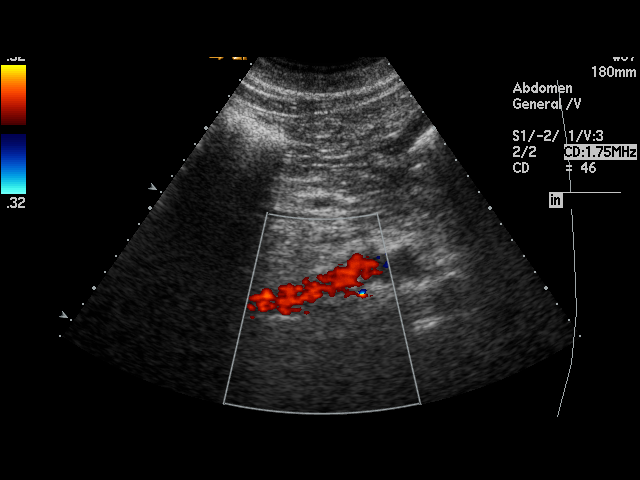
[im 6/19]
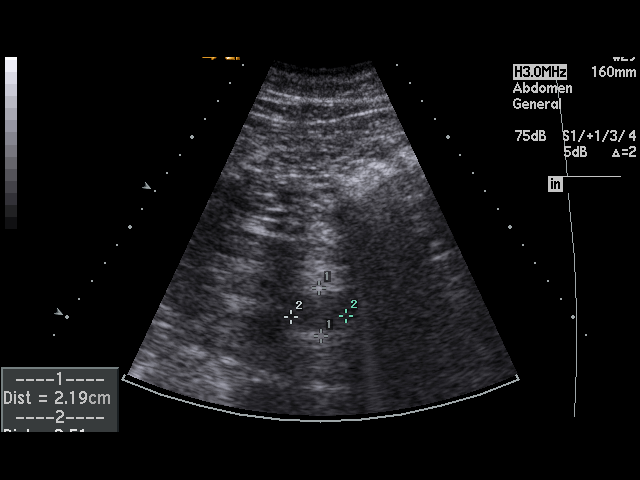
[im 7/19]
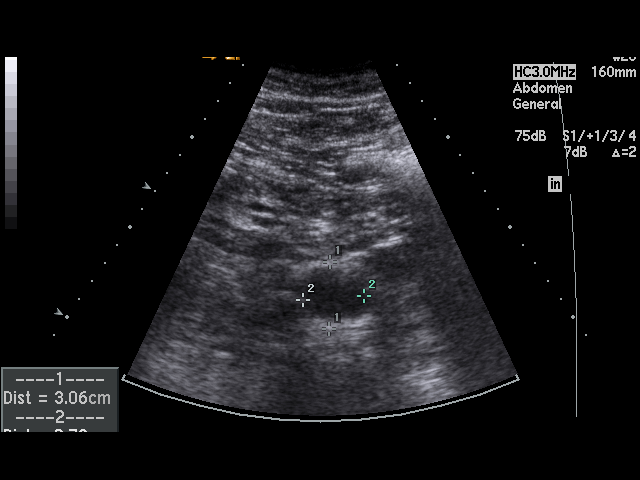
[im 8/19]
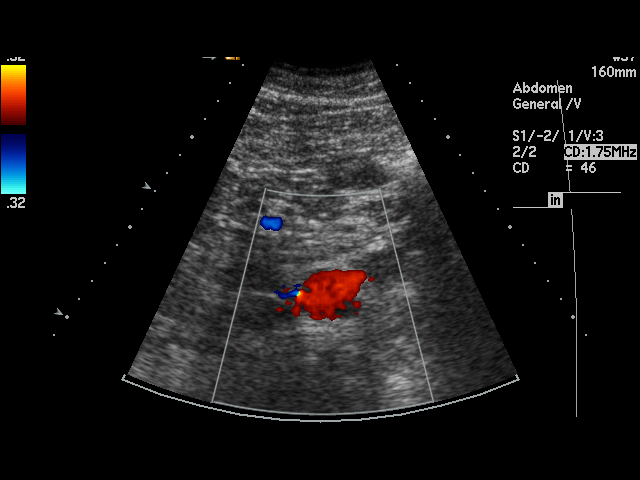
[im 9/19]
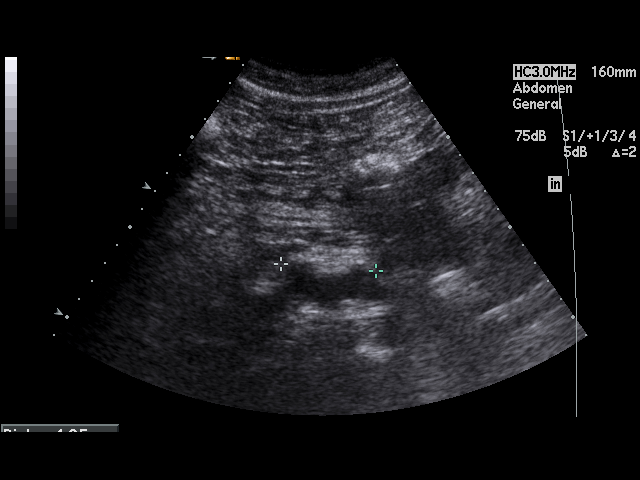
[im 10/19]
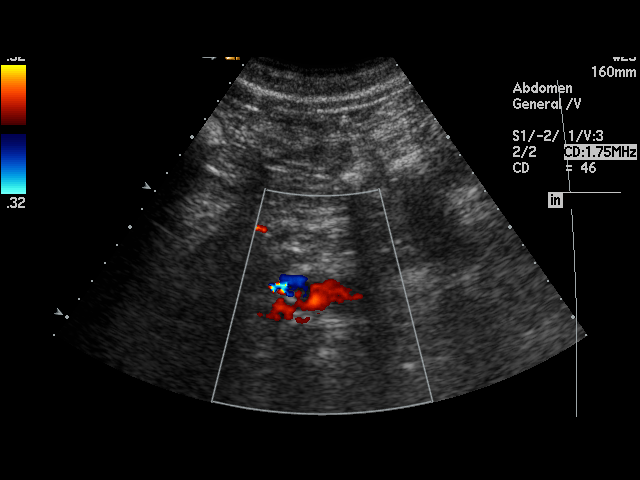
[im 11/19]
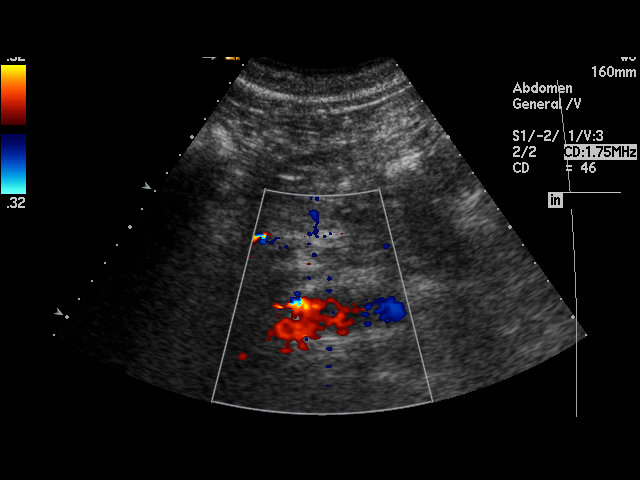
[im 12/19]
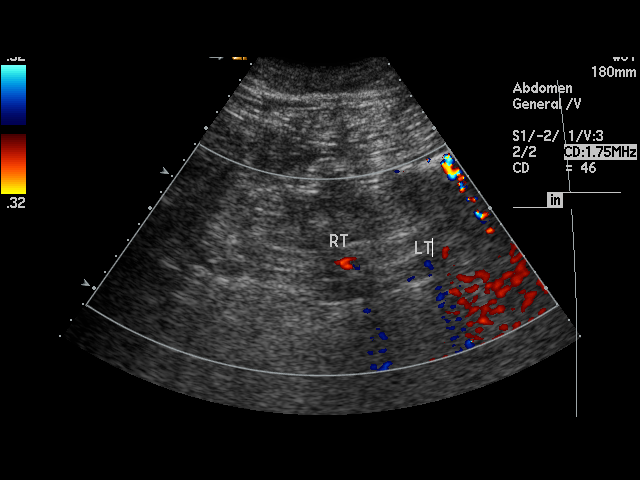
[im 13/19]
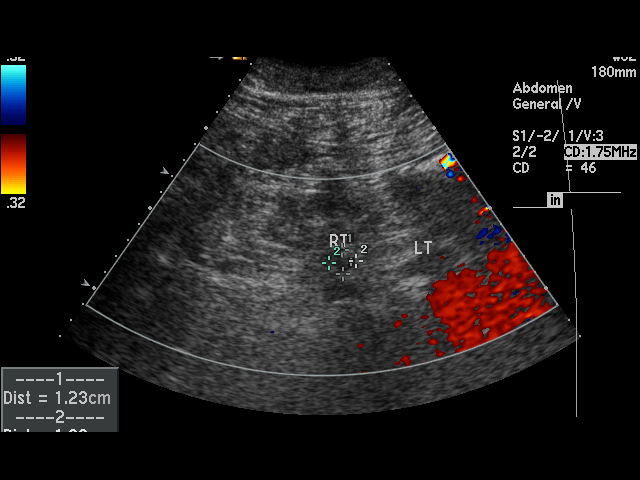
[im 14/19]
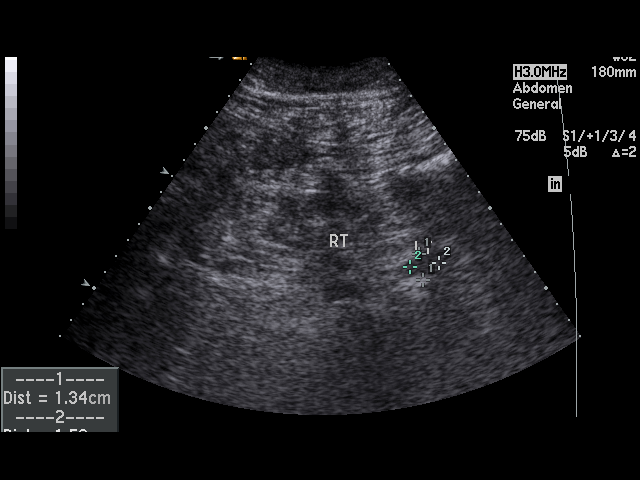
[im 16/19]
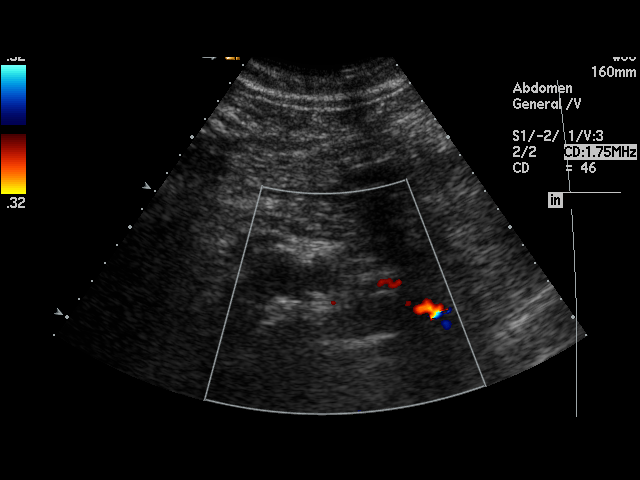
[im 17/19]
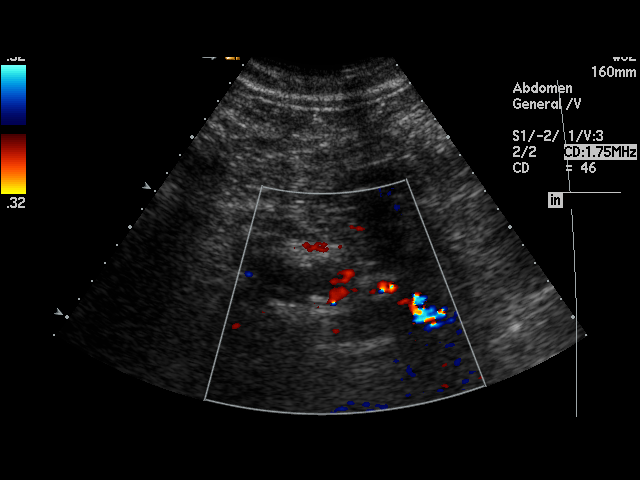
[im 18/19]
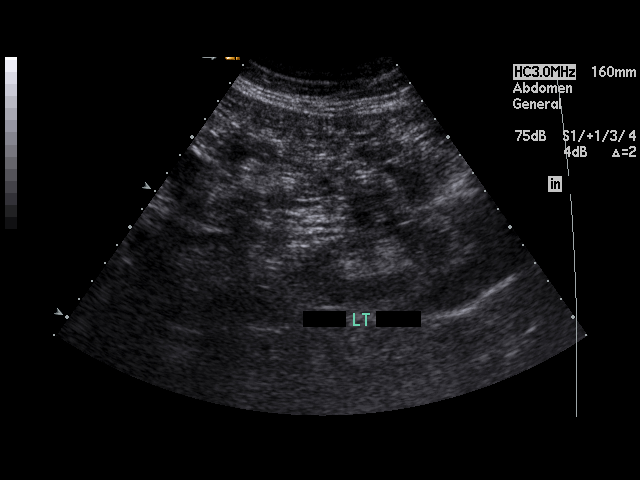
[im 19/19]
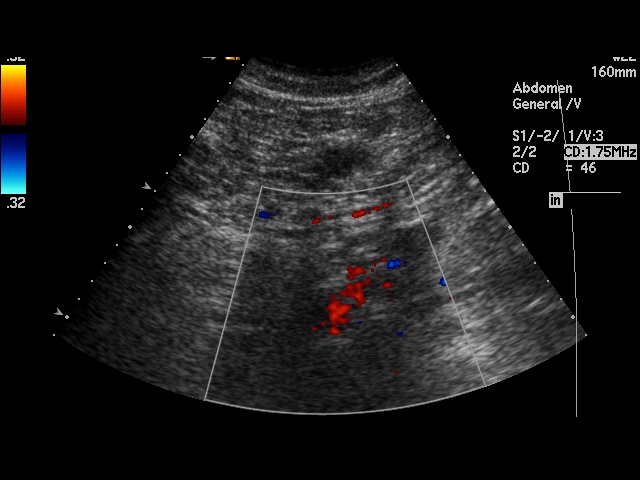

[17 of 19 positions shown; findings below may reference images not displayed]

PROCEDURE:     US  - US AORTA  - [DATE]  [DATE]

RESULT:     The patient's known distal abdominal aorta aneurysm is again
visualized. On this exam, the aneurysm measures 3.06 cm at maximum AP
diameter and 2.79 cm at maximum transverse diameter. The aneurysm is
essentially unchanged as compared to the prior exam. The common iliac
arteries appear slightly ectatic bilaterally.
IMPRESSION: The patient's known distal abdominal aorta is stable in size as compared to
the prior exam and on this study measures 3.06 cm at maximum AP diameter x
2.79 cm transversely.

## 2009-04-26 IMAGING — US US RENAL KIDNEY
1 series · 17 of 25 positions shown · non-contrast
Comparison: none

REASON FOR EXAM: KNOWN AAA  kidney lesions
COMMENTS:

[Series 1: us renal kidney · 17 of 29 slices shown]
[im 1/29]
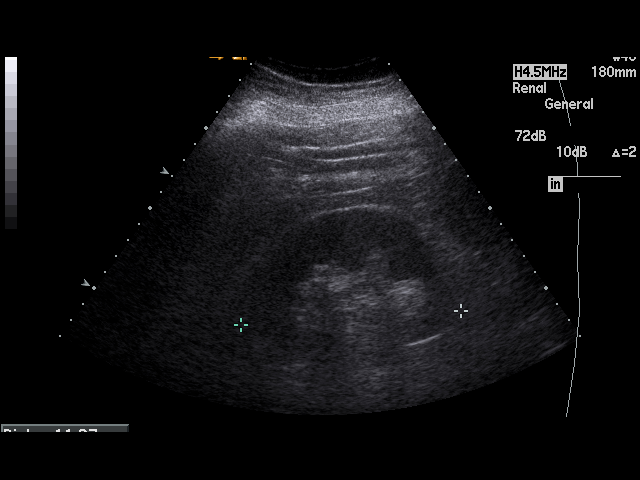
[im 3/29]
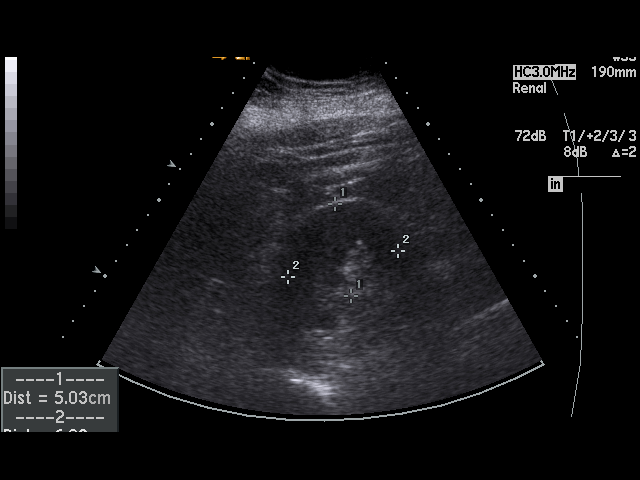
[im 4/29]
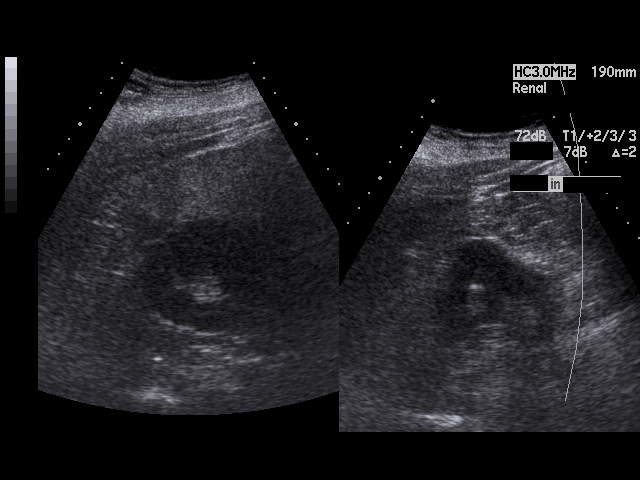
[im 6/29]
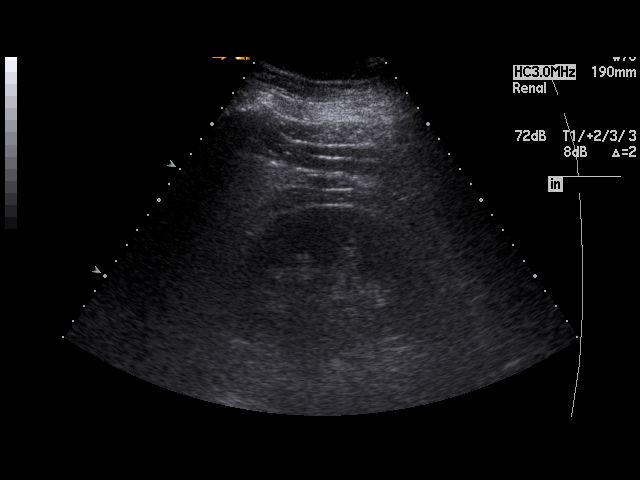
[im 8/29]
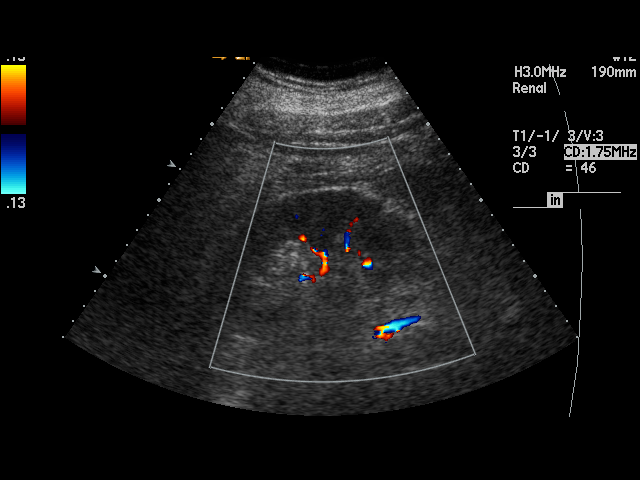
[im 10/29]
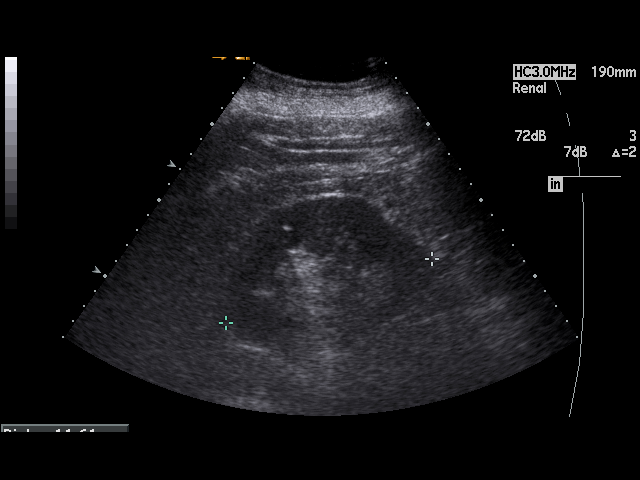
[im 11/29]
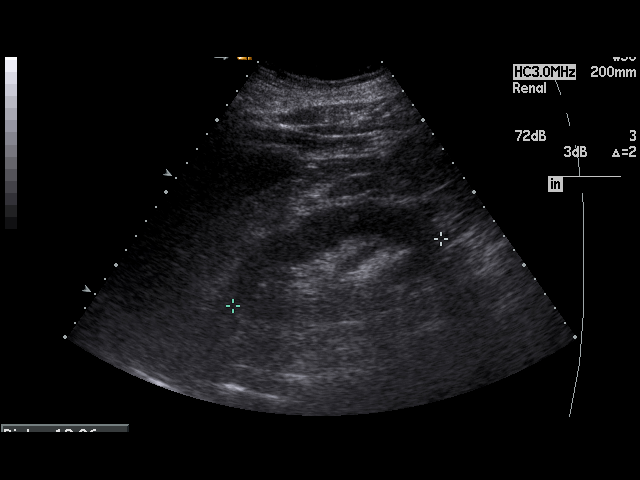
[im 13/29]
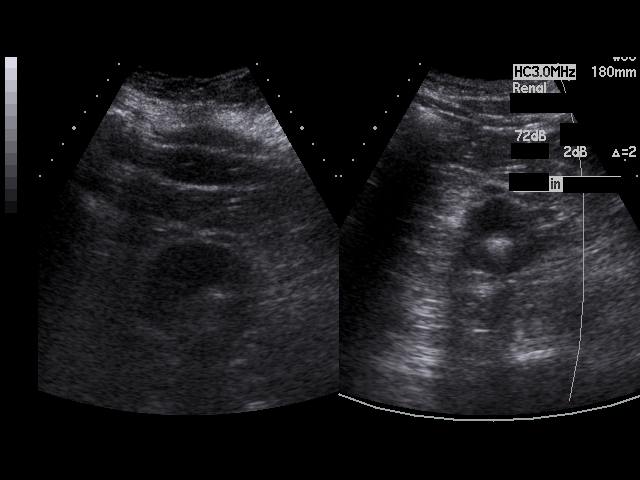
[im 15/29]
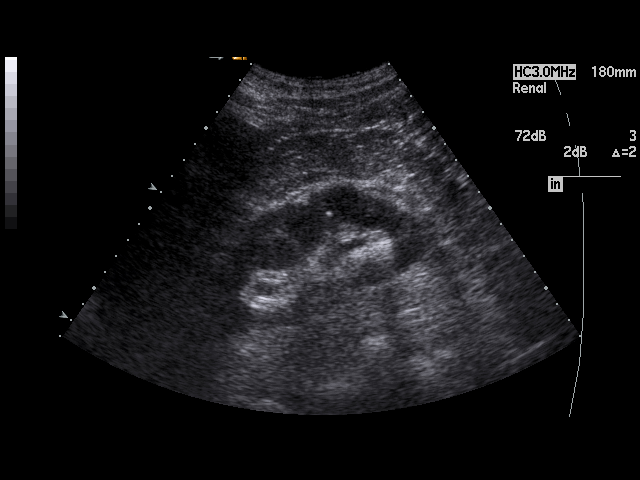
[im 16/29]
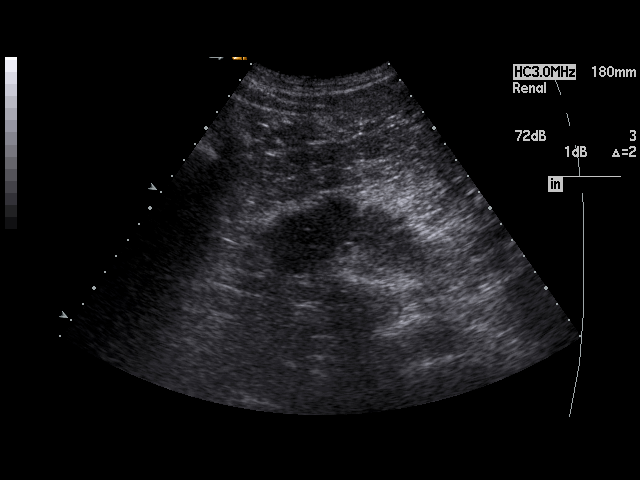
[im 18/29]
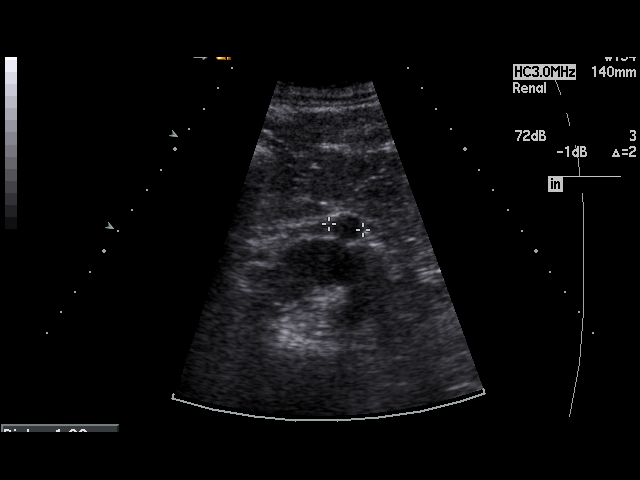
[im 19/29]
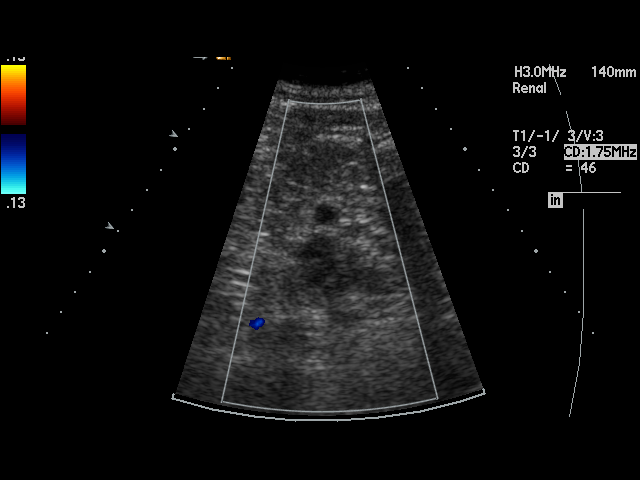
[im 22/29]
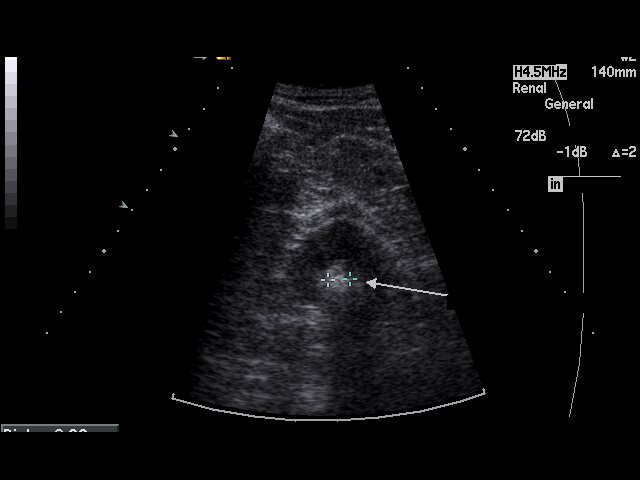
[im 23/29]
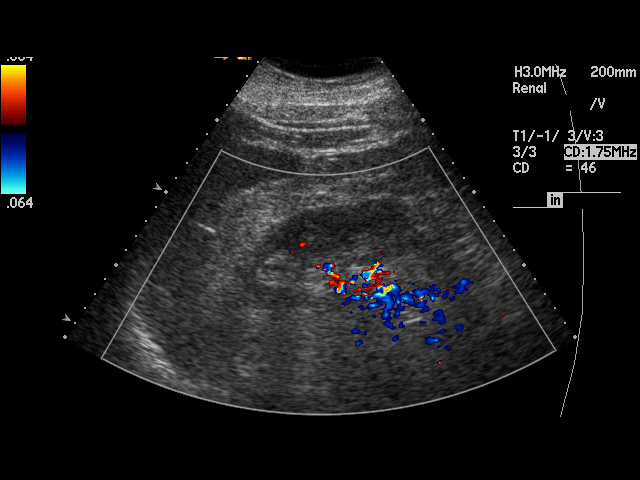
[im 25/29]
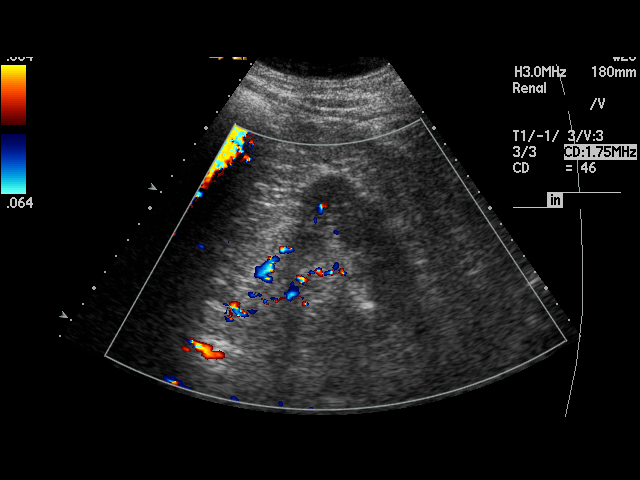
[im 26/29]
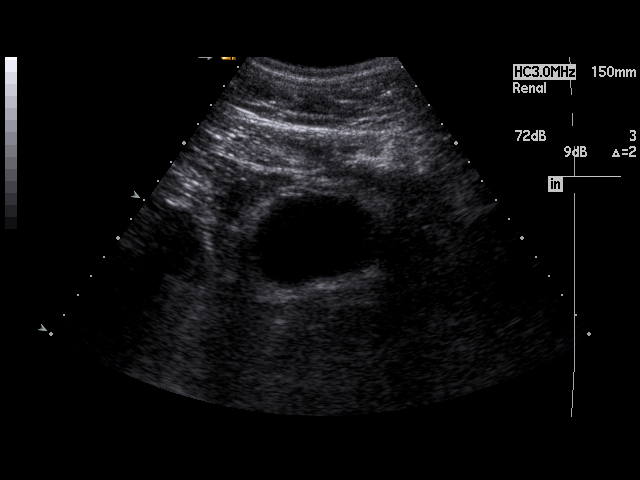
[im 29/29]
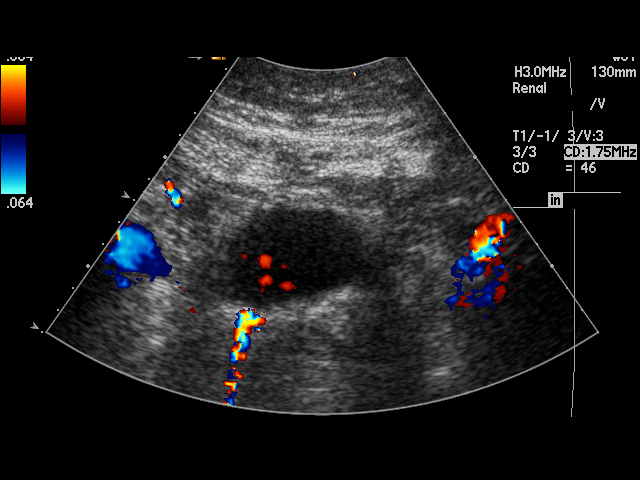

[17 of 25 positions shown; findings below may reference images not displayed]

PROCEDURE:     US  - US KIDNEY  - [DATE]  [DATE]

RESULT:     The right kidney measures 11.27 cm x 5.03 cm x 6.09 cm and the
left kidney measures 12.36 cm x 4.0 cm x 4.59 cm. There is a 1.39 cm
exophytic cyst of the lower pole of the left kidney. This is unchanged in
appearance as compared to a prior exam of [DATE]. No other renal
mass lesions are seen. There is a 8.9 mm calcification at the lower pole of
the left kidney. No other renal calcifications are identified. There is no
hydronephrosis. The visualized portion of the partially filled urinary
bladder shows no significant abnormalities. Bilateral ureteral flow jets are
seen.
IMPRESSION: 1. There is a stable 13.9 mm cyst of the lower pole of the left kidney.
2. There is a nonobstructive stone at the lower pole of the left kidney.
3. No hydronephrosis or other acute change is seen.

## 2009-11-20 ENCOUNTER — Ambulatory Visit: Payer: Self-pay | Admitting: Family Medicine

## 2009-11-20 IMAGING — US ULTRASOUND AORTA
1 series · 12 of 12 positions shown · non-contrast
Comparison: none

REASON FOR EXAM: KNOWN AAA
COMMENTS:

[Series 1: ultrasound aorta · 12 of 12 slices shown]
[im 1/12]
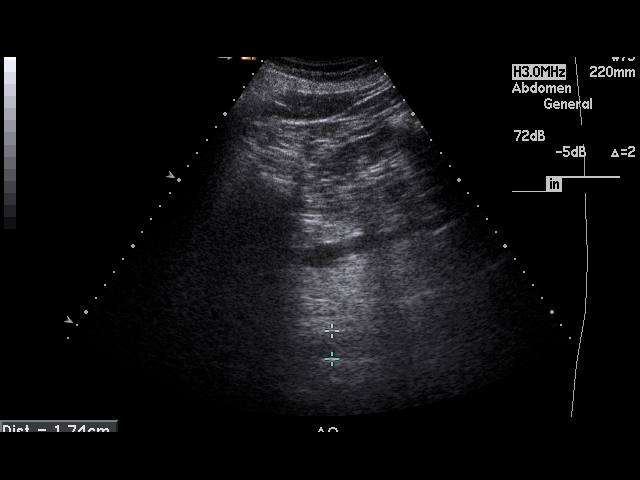
[im 2/12]
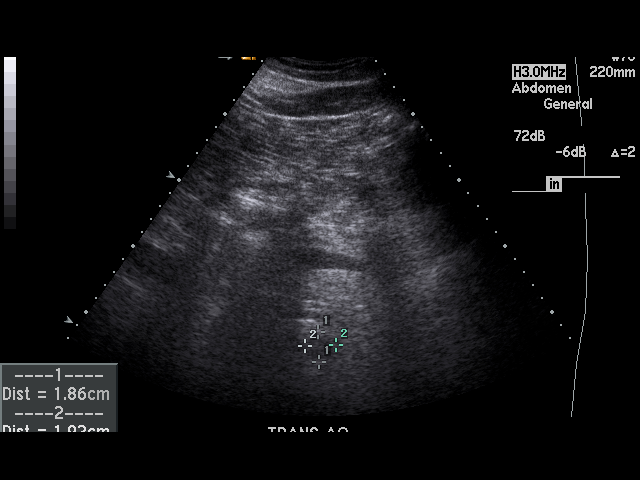
[im 3/12]
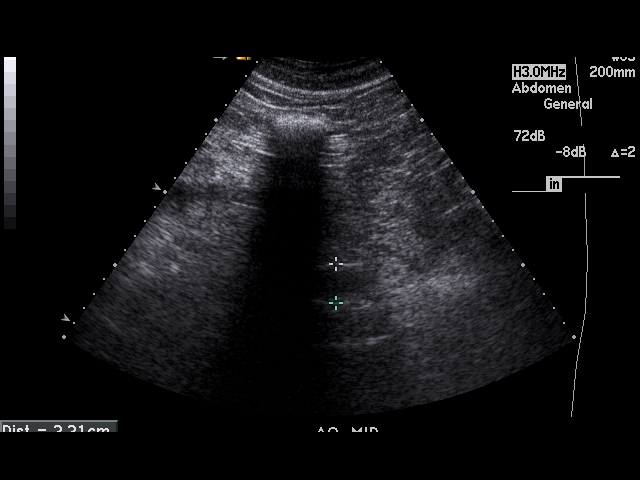
[im 4/12]
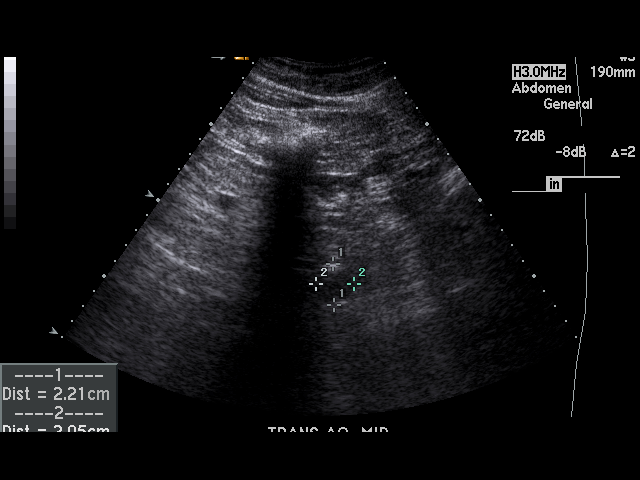
[im 5/12]
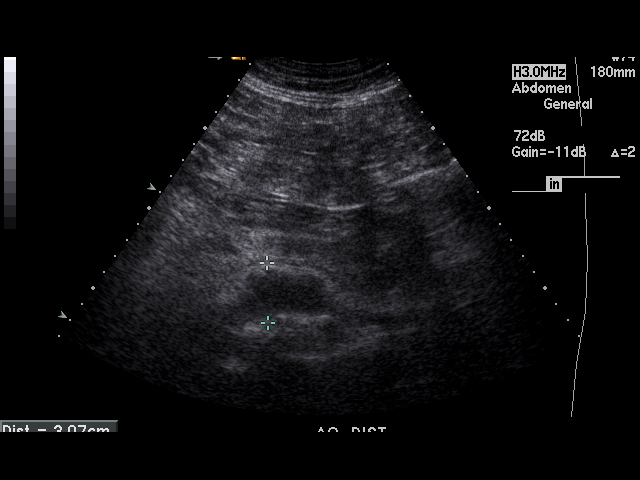
[im 6/12]
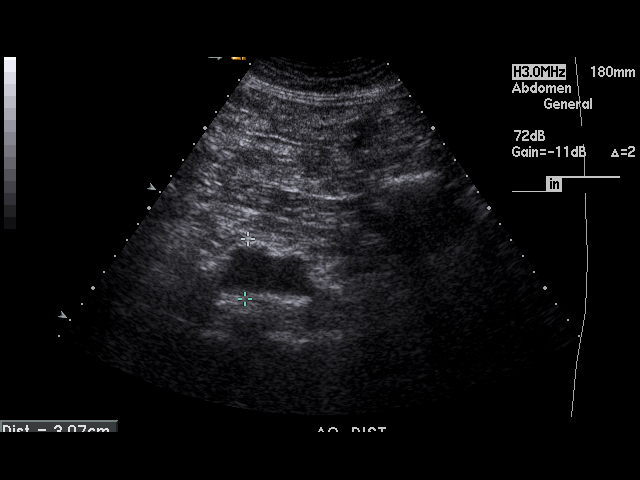
[im 7/12]
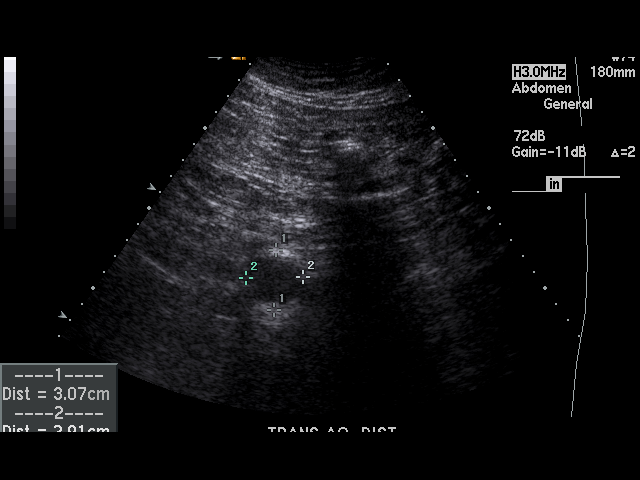
[im 8/12]
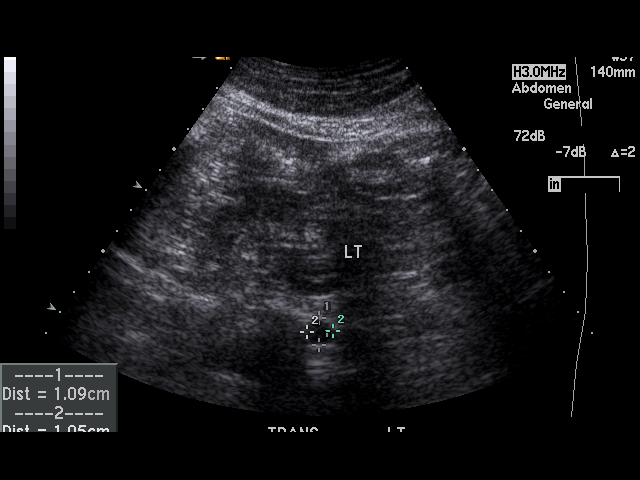
[im 9/12]
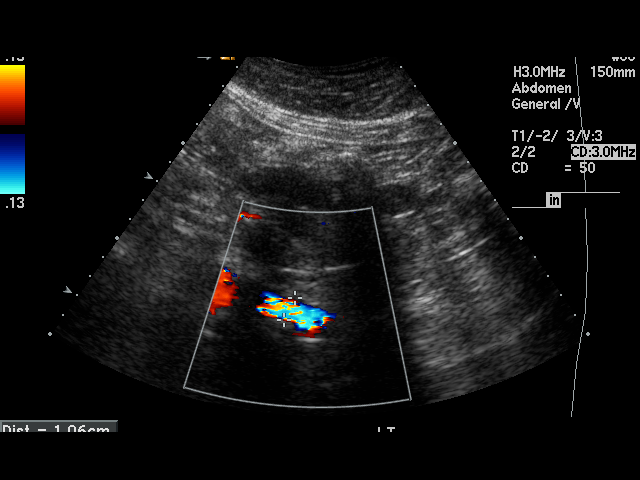
[im 10/12]
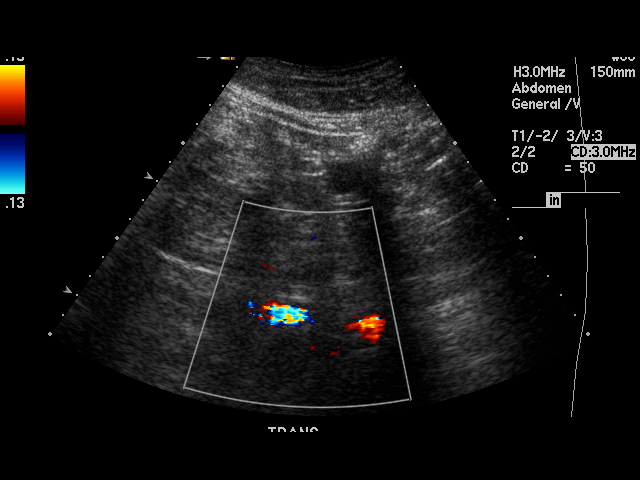
[im 11/12]
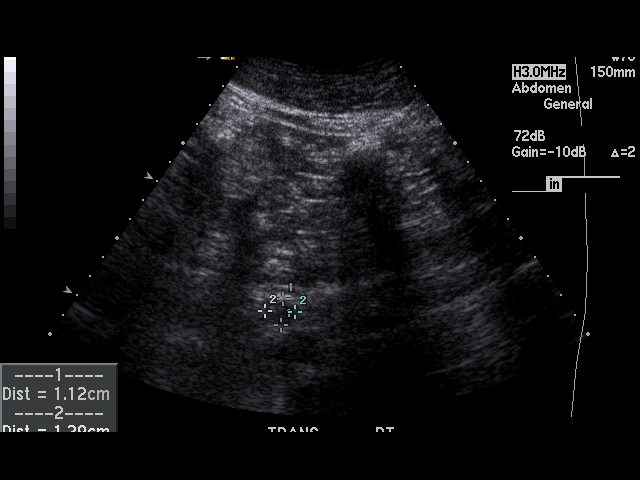
[im 12/12]
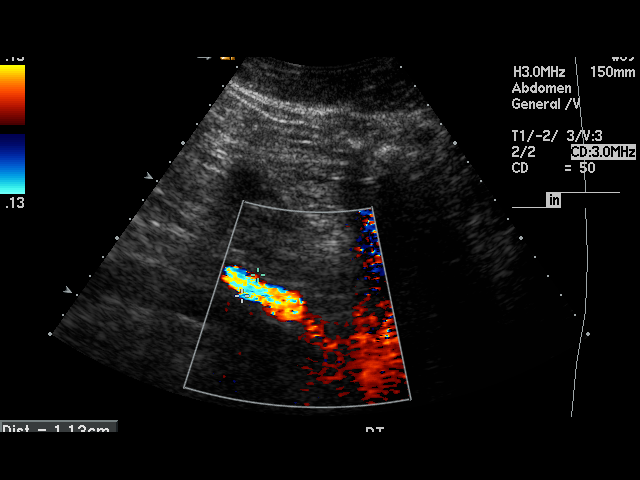

[12 of 12 positions shown; findings below may reference images not displayed]

PROCEDURE:     US  - US AORTA  - [DATE]  [DATE]

RESULT:     The patient's known distal abdominal aorta aneurysm is again
observed. On this exam the aneurysm measures 3.07 cm at maximum AP diameter
and 2.91 cm transversely. These measurements are essentially unchanged as
compared to the prior exam of [DATE]. The common iliac arteries
are normal in appearance.
IMPRESSION: 1.     There is a distal abdominal aorta aneurysm measuring 3.07 cm x
cm and which appears to be stable in size as compared to the prior exam of
[DATE].

## 2010-06-28 ENCOUNTER — Ambulatory Visit: Payer: Self-pay | Admitting: Family Medicine

## 2010-06-28 IMAGING — US US RENAL KIDNEY
1 series · 17 of 25 positions shown · non-contrast
Comparison: None

REASON FOR EXAM: known AAA  kidney stones  FU
COMMENTS:

PROCEDURE:     US  - US KIDNEY  - [DATE]  [DATE]
RESULT:     Indication: Renal calculi

[Series 1: us renal kidney · 17 of 39 slices shown]
[im 1/39]
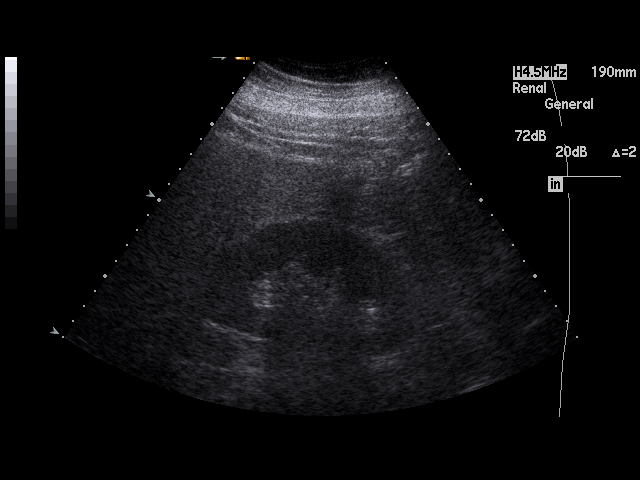
[im 4/39]
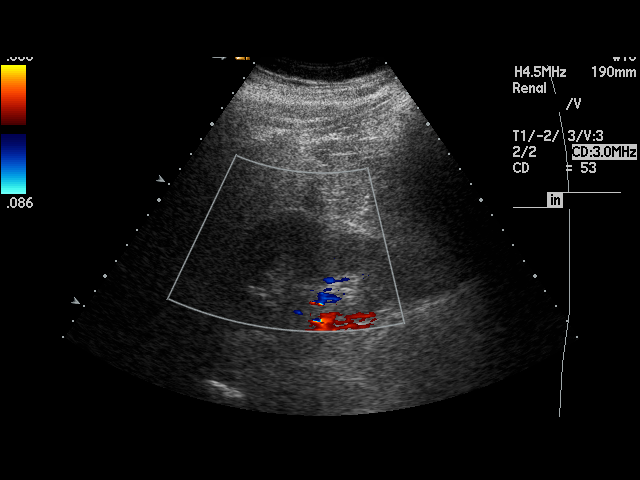
[im 5/39]
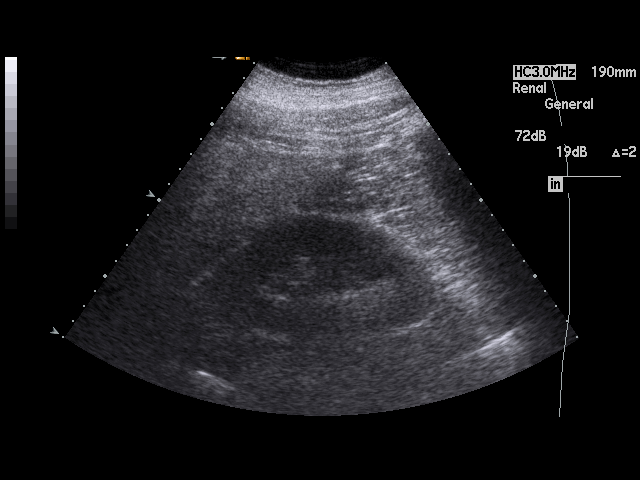
[im 8/39]
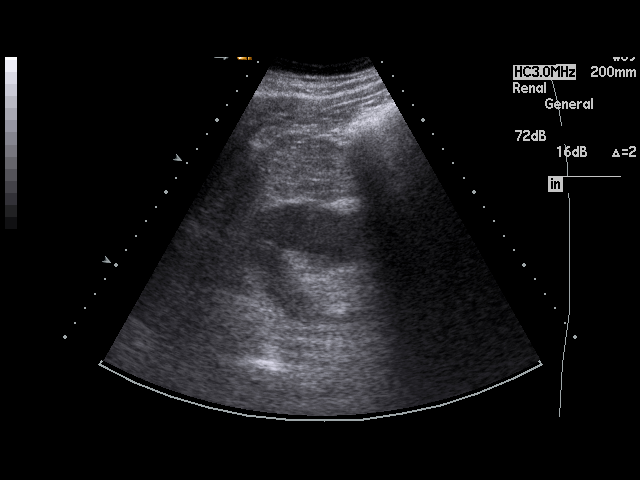
[im 10/39]
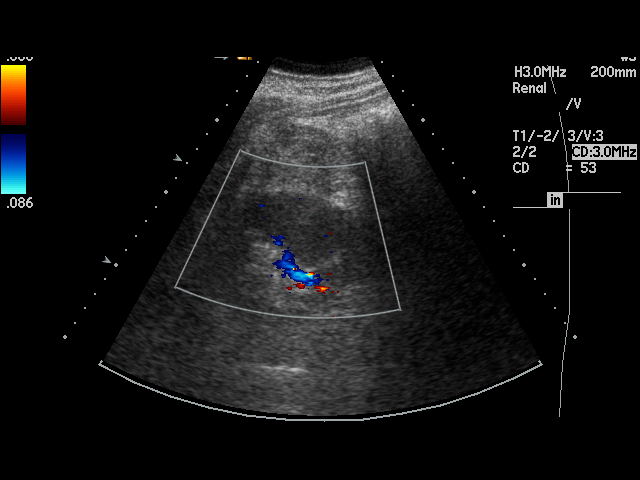
[im 13/39]
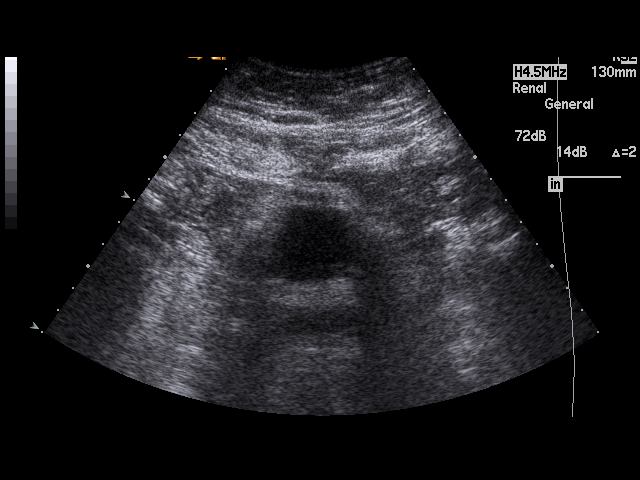
[im 15/39]
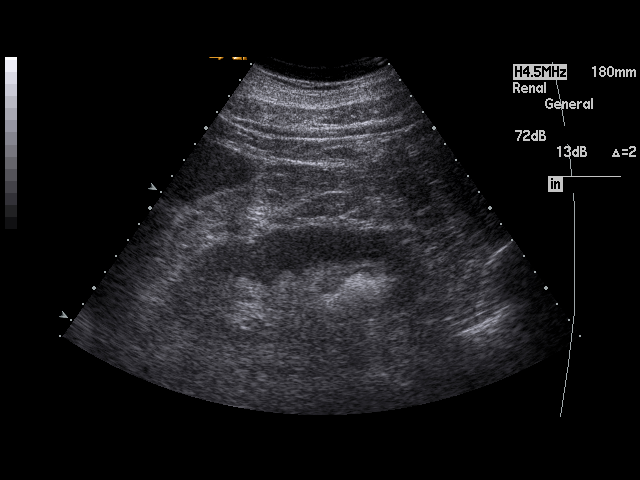
[im 18/39]
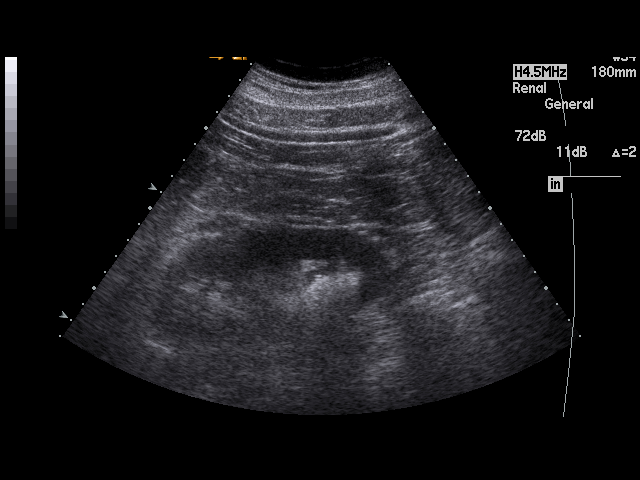
[im 20/39]
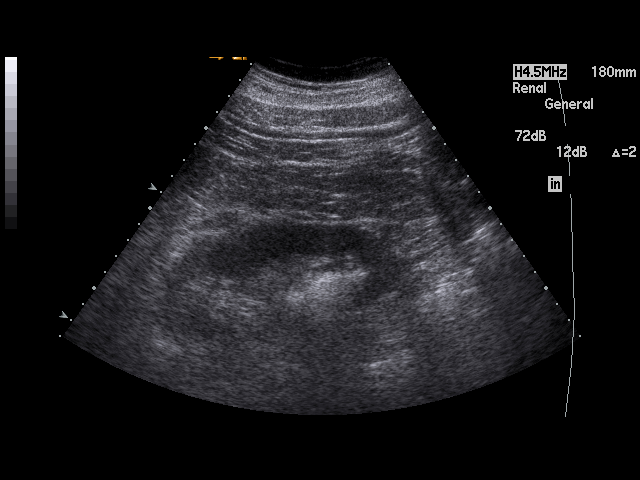
[im 21/39]
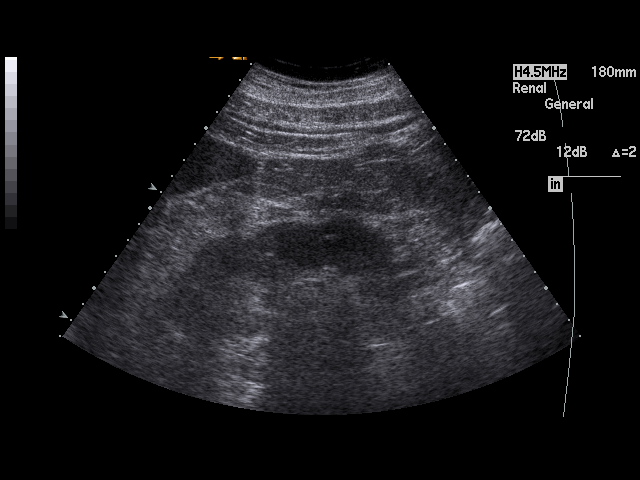
[im 24/39]
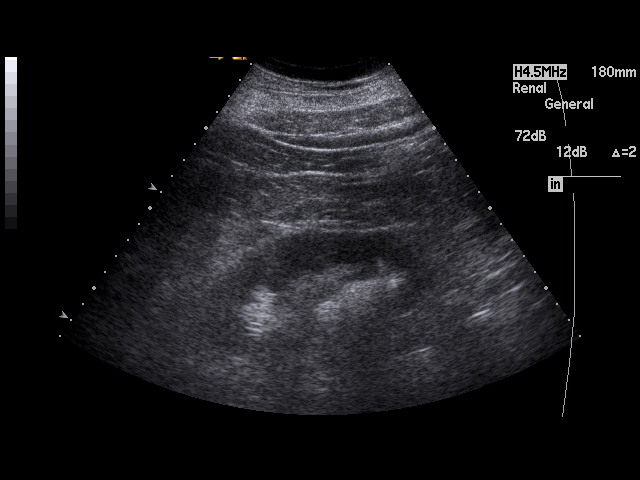
[im 26/39]
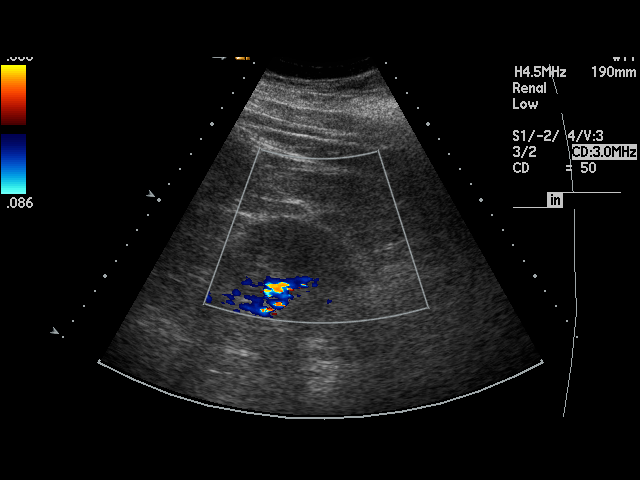
[im 29/39]
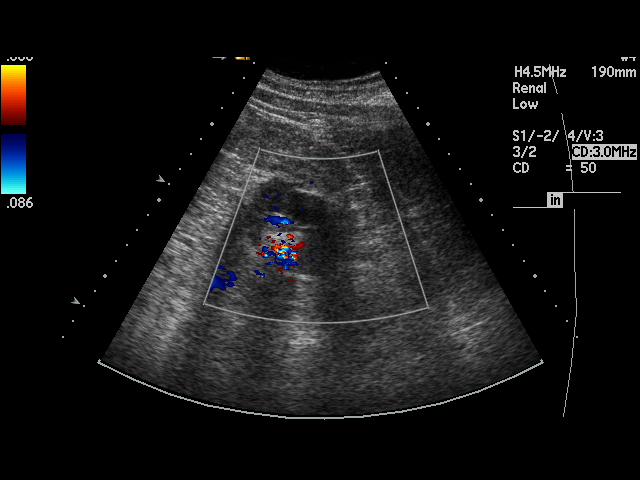
[im 31/39]
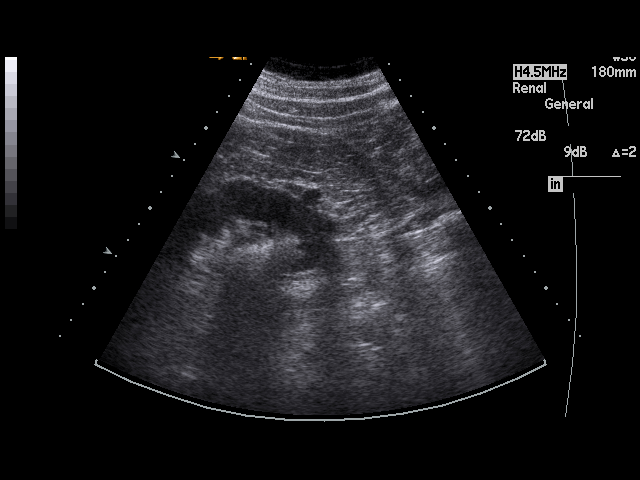
[im 34/39]
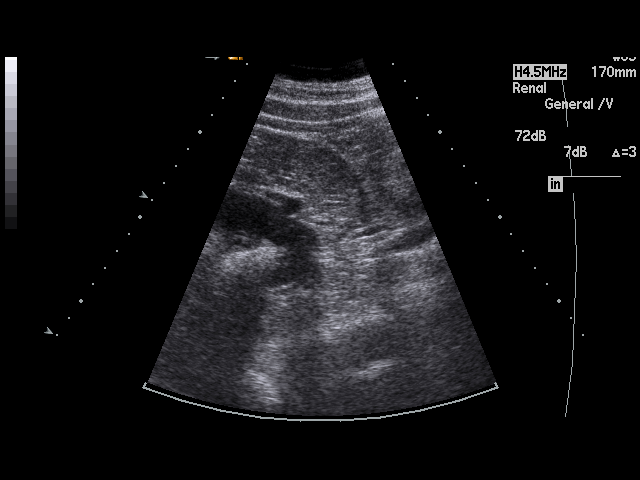
[im 35/39]
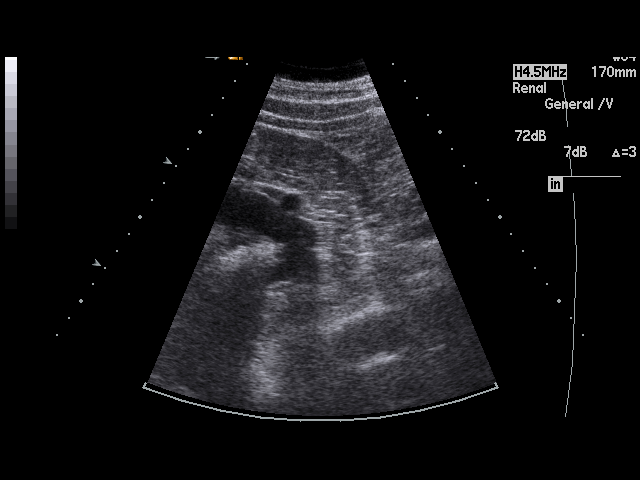
[im 39/39]
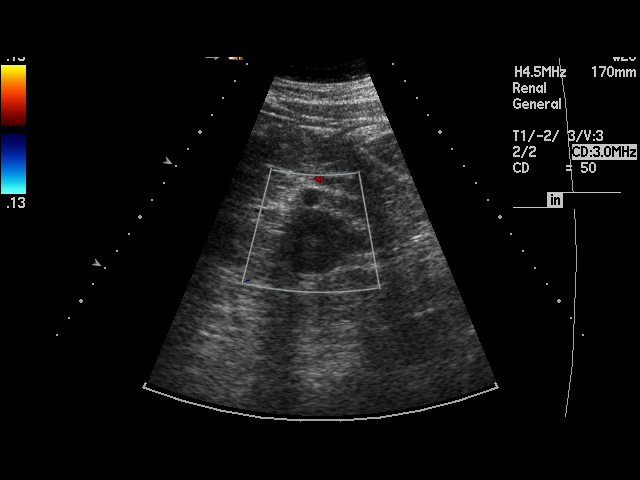

[17 of 25 positions shown; findings below may reference images not displayed]

Technique and findings: Multiple gray-scale and color doppler images of the
kidneys were obtained.

The right kidney measures 11.8 x 5.6 x 5.8 cm and the left kidney measures
13 x 5.9 x 6.2 cm. The kidneys are normal in echogenicity. There is no
hydronephrosis. There is a calculus in the inferior pole of the left kidney.
There are no echogenic foci within the right kidney. There is a small
anechoic left renal mass measuring 1.1 x 1 x 1.3 cm most consistent with a
cyst, unchanged compared with the prior exams. There is no free fluid in the
region of the renal fossa.

The bladder is under distended limiting evaluation.
IMPRESSION: Nonobstructing left nephrolithiasis.

## 2011-01-08 ENCOUNTER — Ambulatory Visit: Payer: Self-pay | Admitting: Family Medicine

## 2011-01-08 IMAGING — US ULTRASOUND AORTA
1 series · 17 of 25 positions shown · non-contrast
Comparison: none

REASON FOR EXAM: Known AAA
COMMENTS:

[Series 1: ultrasound aorta · 17 of 28 slices shown]
[im 1/28]
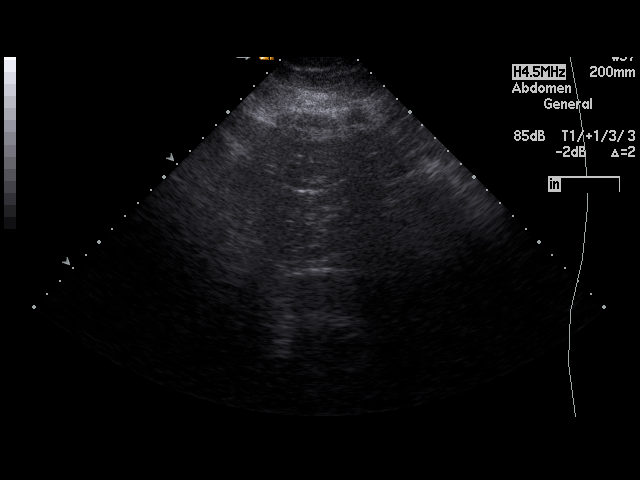
[im 3/28]
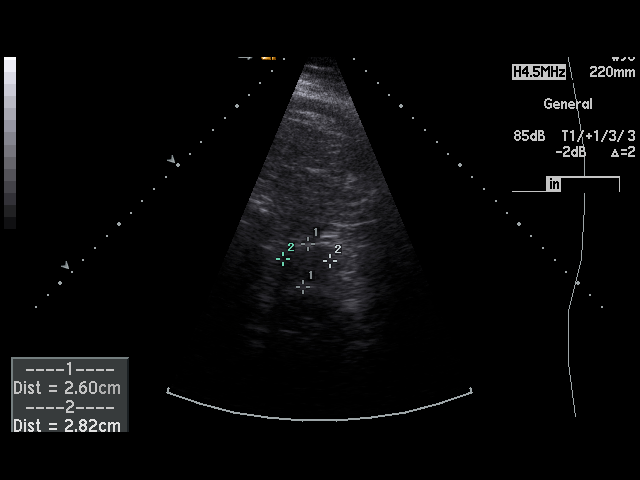
[im 4/28]
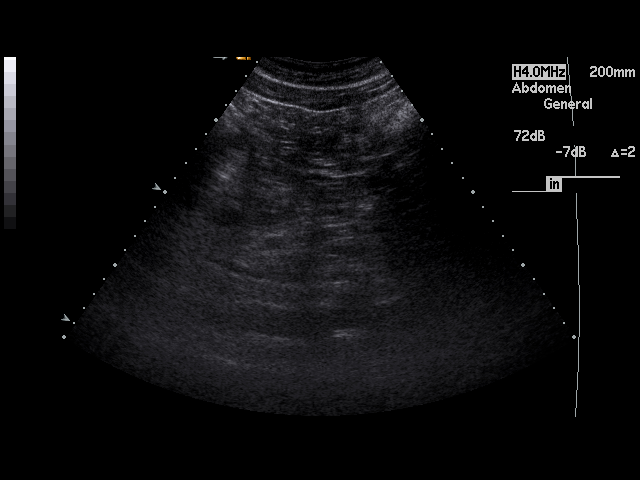
[im 6/28]
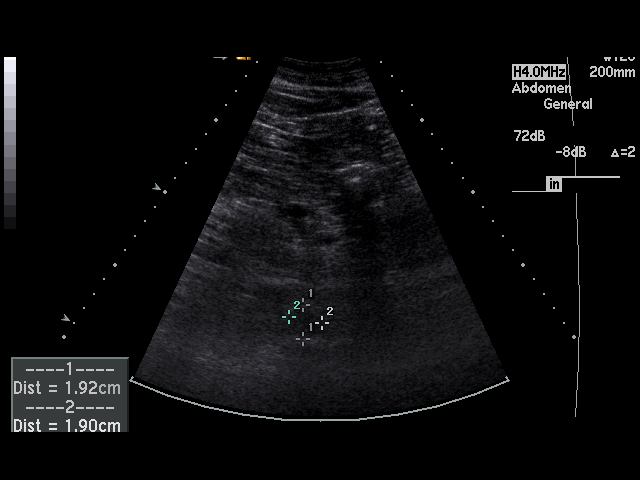
[im 7/28]
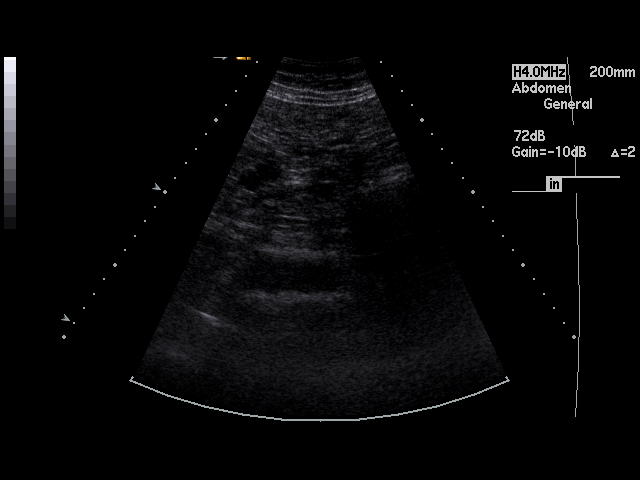
[im 10/28]
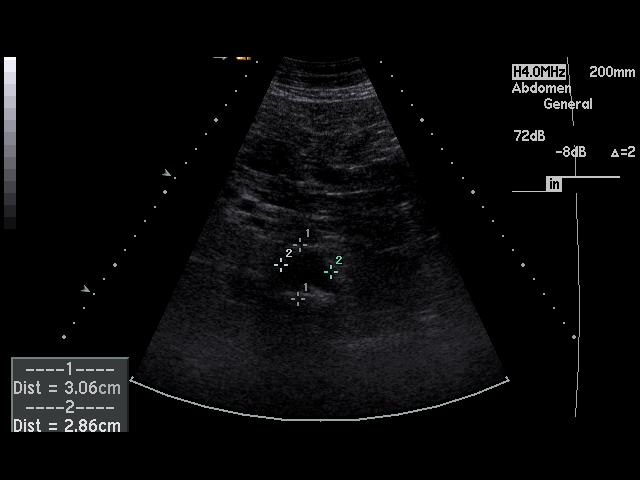
[im 11/28]
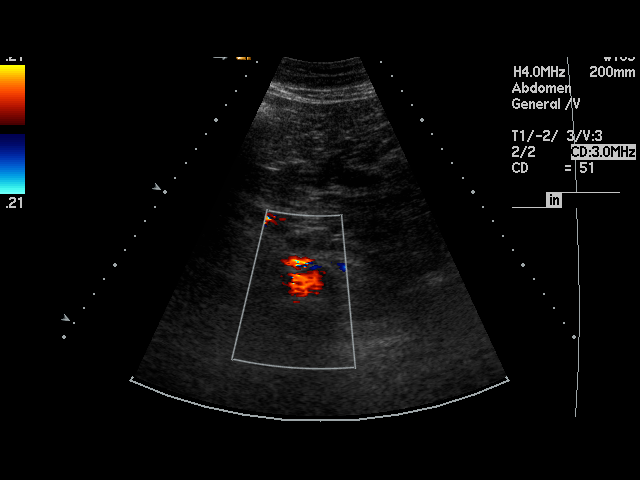
[im 13/28]
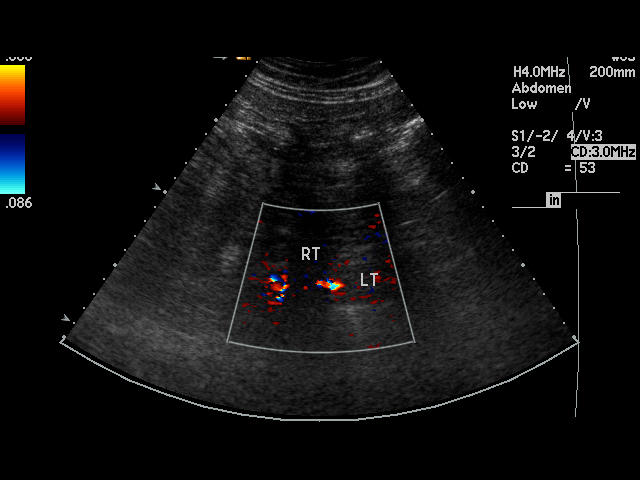
[im 14/28]
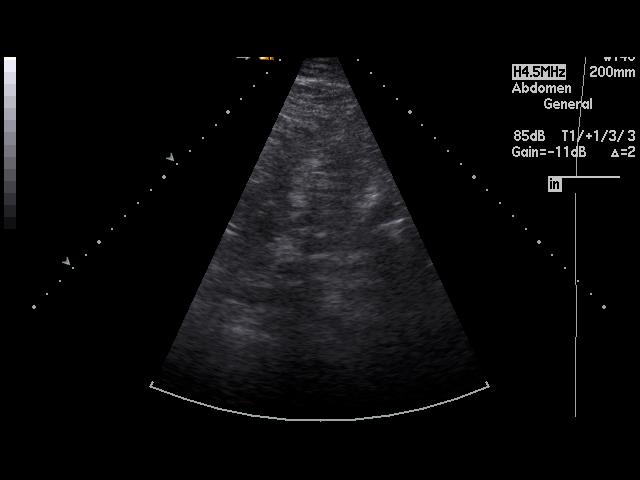
[im 15/28]
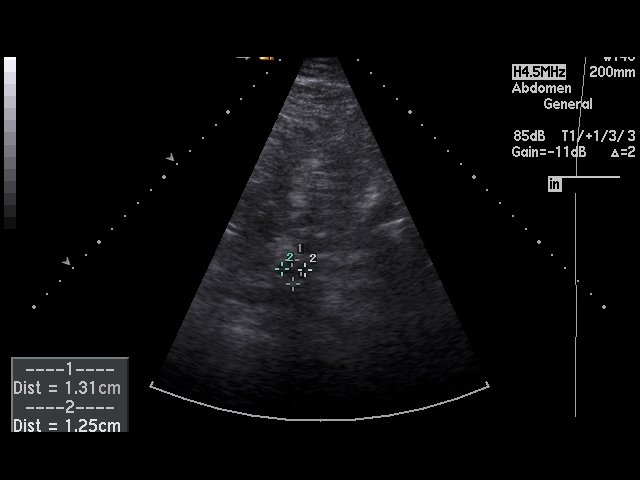
[im 17/28]
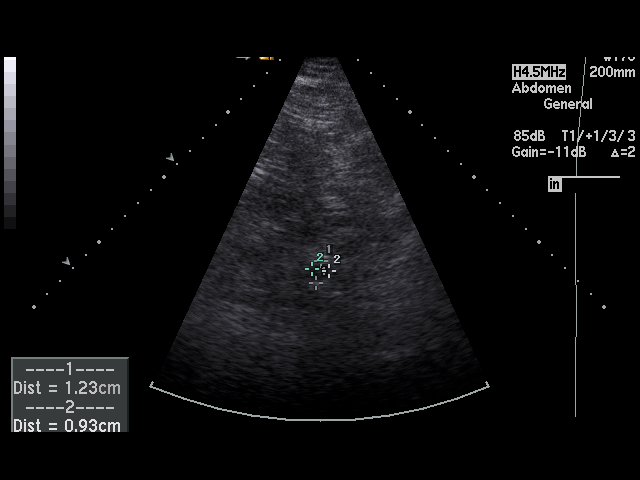
[im 19/28]
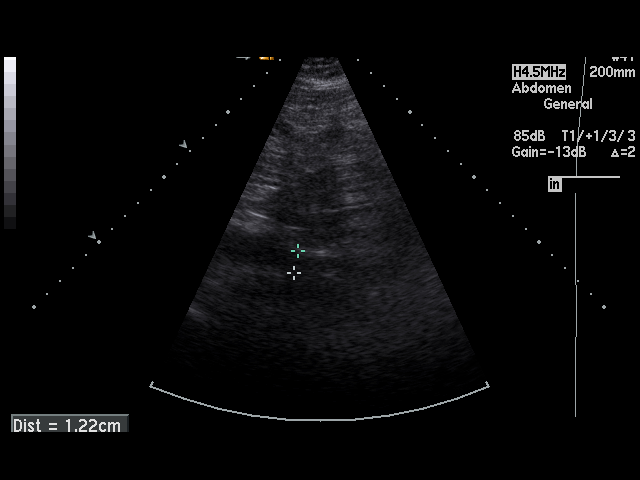
[im 21/28]
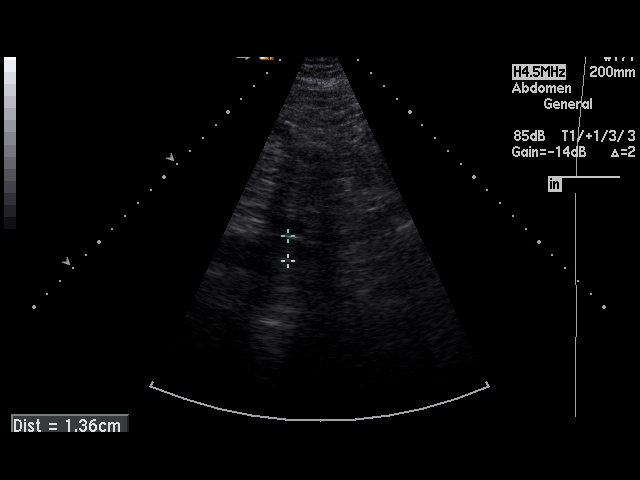
[im 22/28]
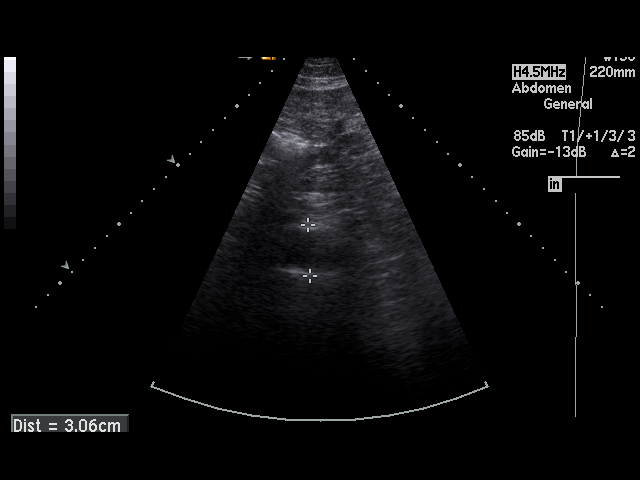
[im 24/28]
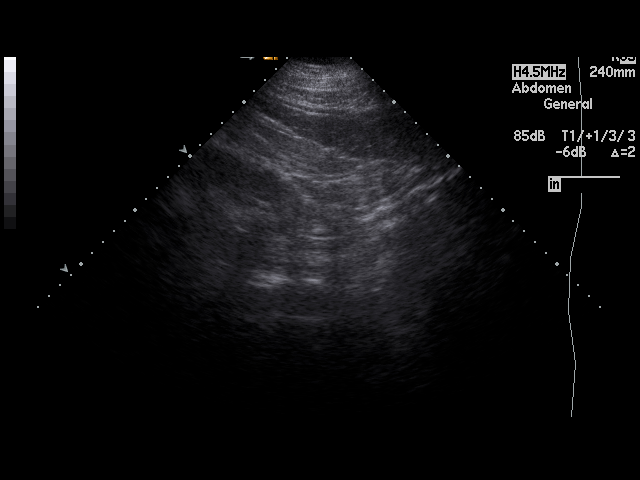
[im 25/28]
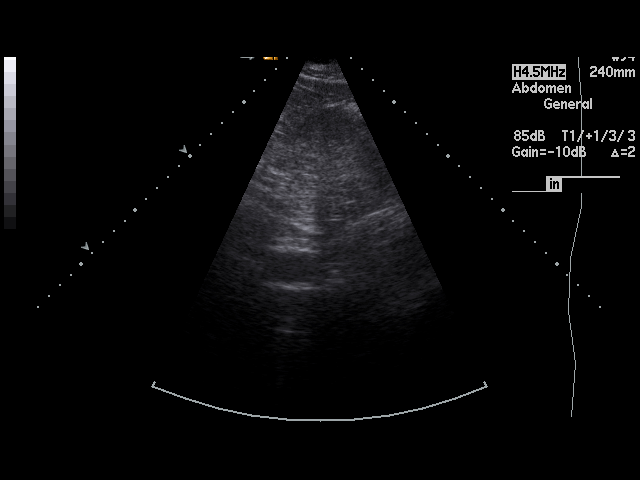
[im 28/28]
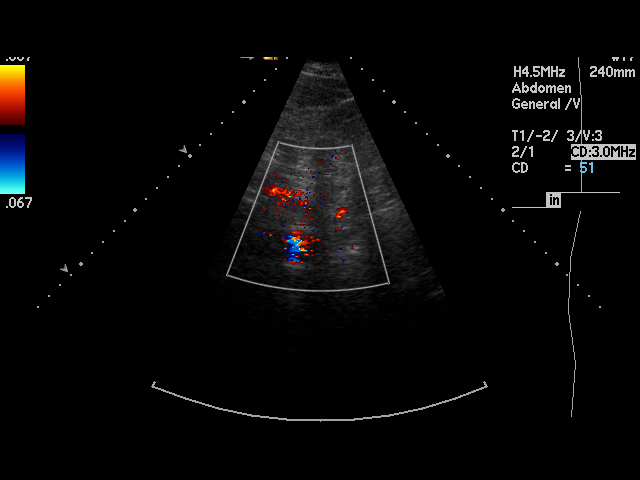

[17 of 25 positions shown; findings below may reference images not displayed]

PROCEDURE:     NACHO - NACHO AORTA  - [DATE]  [DATE]

RESULT:     Ultrasound of the abdominal aorta demonstrates a limited study
due to the patient's body habitus and overlying bowel gas. Only portions of
the aorta could be visualized. The maximal AP dimension of the distal
infrarenal abdominal aortic aneurysm is 3.06 cm with a transverse dimension
of 2.86 cm which is unchanged compared to the previous exam. The iliacs
appear normal.
IMPRESSION: Stable mild aneurysmal dilation in the distal abdominal
aorta.

## 2011-11-26 ENCOUNTER — Ambulatory Visit: Payer: Self-pay | Admitting: Family Medicine

## 2011-11-26 IMAGING — US ULTRASOUND AORTA
1 series · 17 of 25 positions shown · non-contrast
Comparison: none

REASON FOR EXAM: [DATE]  ABD Aneurysm
COMMENTS:

[Series 1: ultrasound aorta · 17 of 26 slices shown]
[im 1/26]
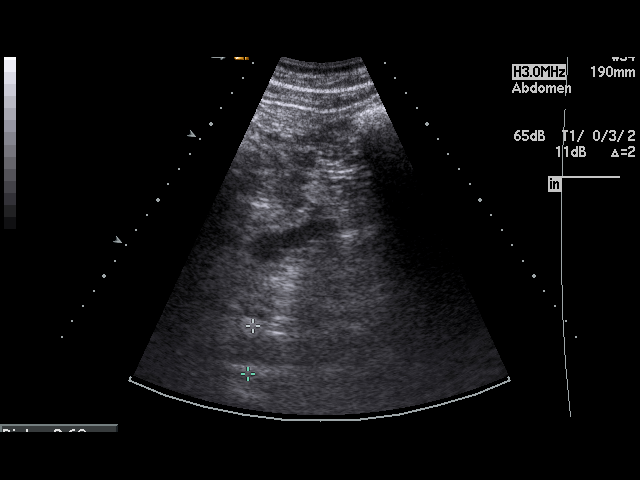
[im 3/26]
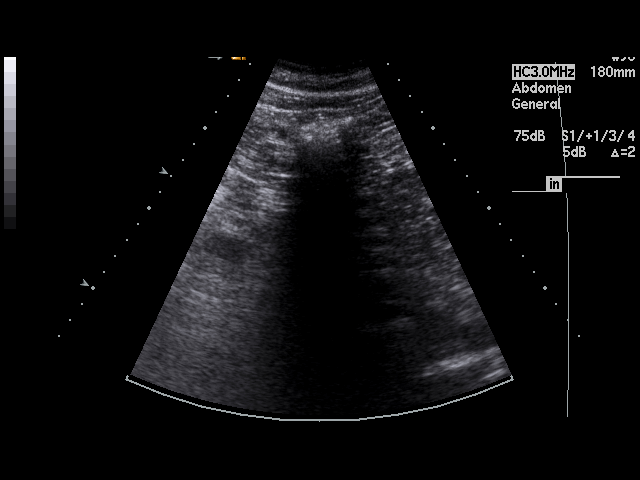
[im 4/26]
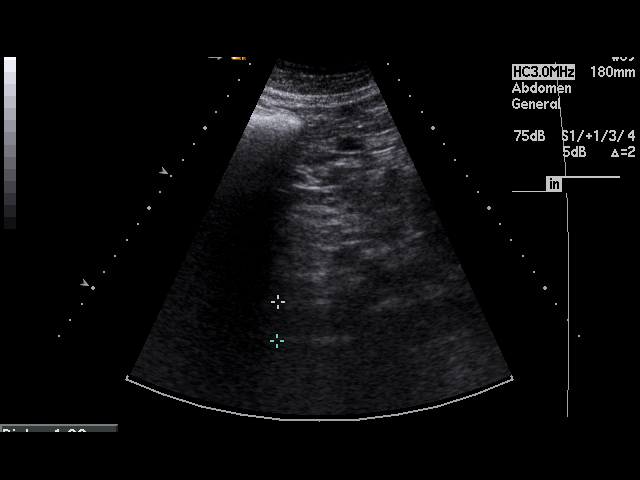
[im 6/26]
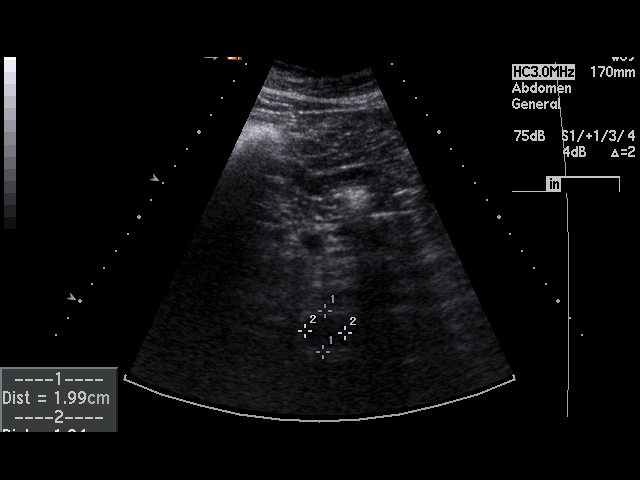
[im 7/26]
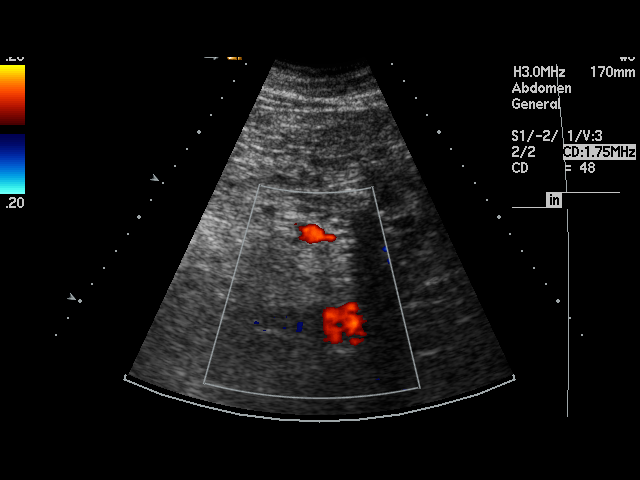
[im 9/26]
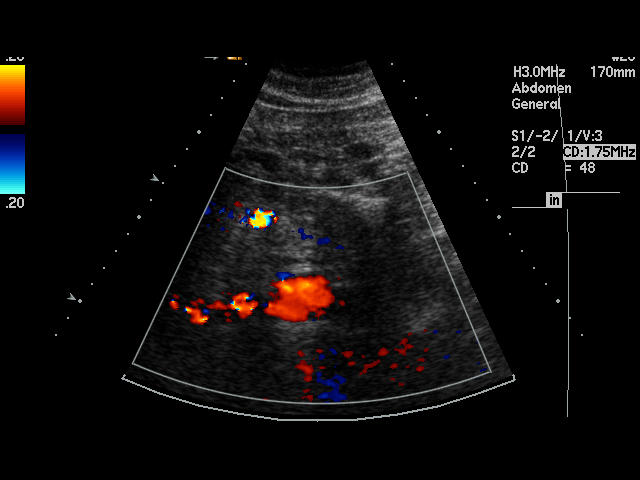
[im 10/26]
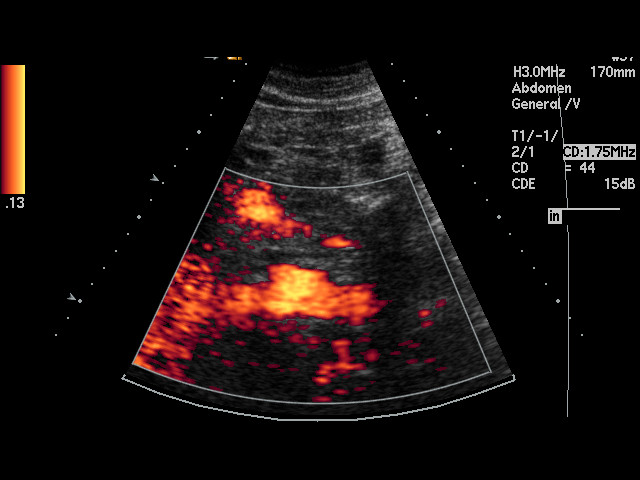
[im 12/26]
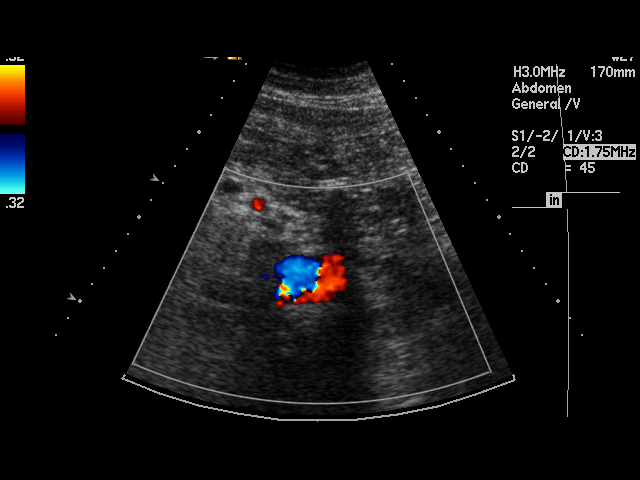
[im 13/26]
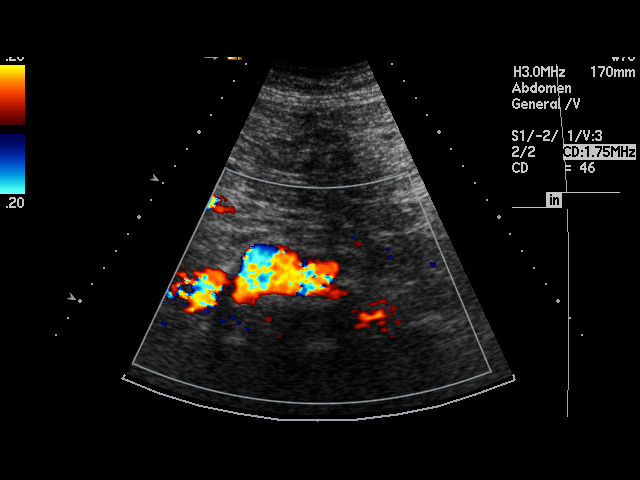
[im 14/26]
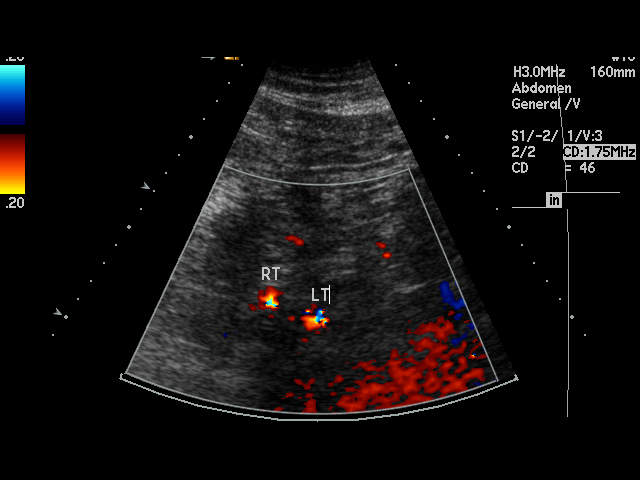
[im 16/26]
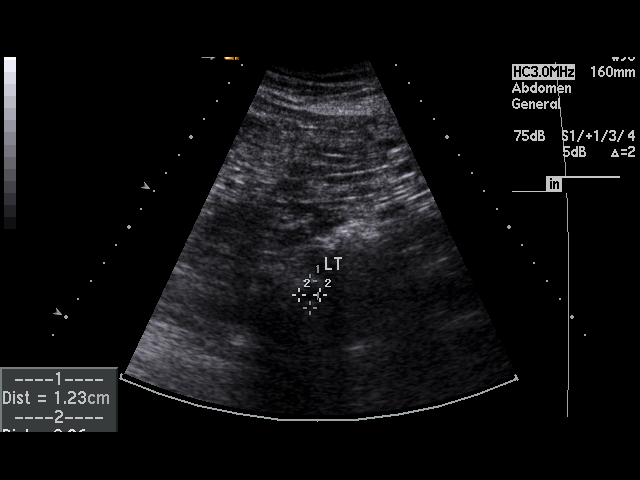
[im 17/26]
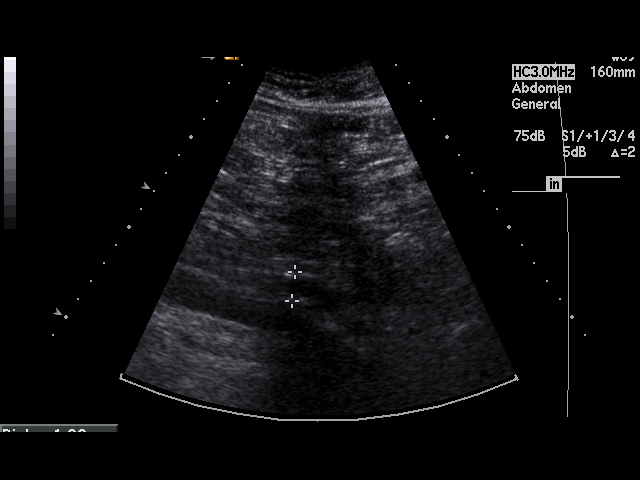
[im 19/26]
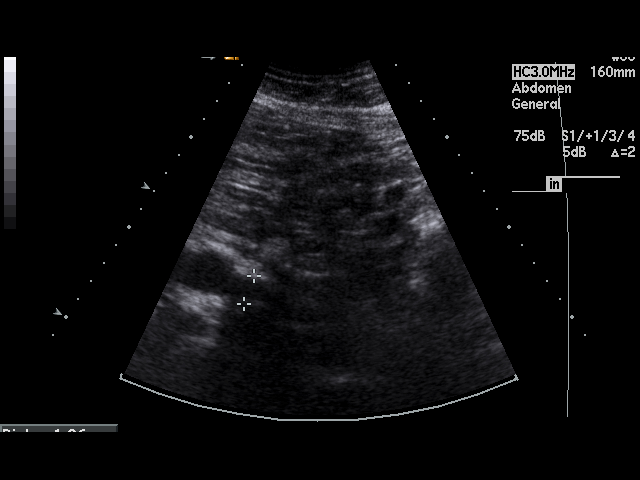
[im 20/26]
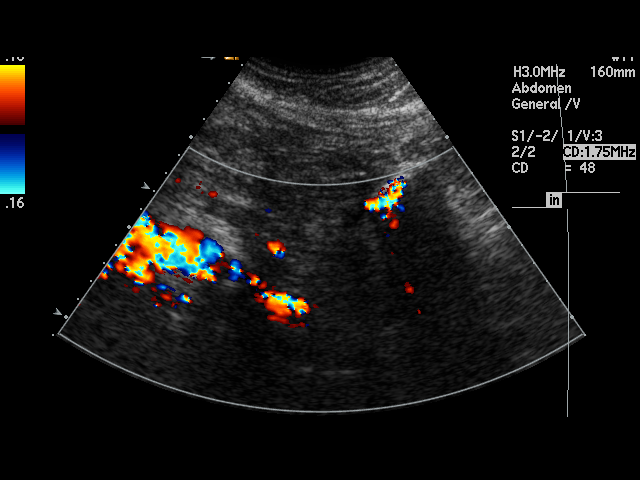
[im 22/26]
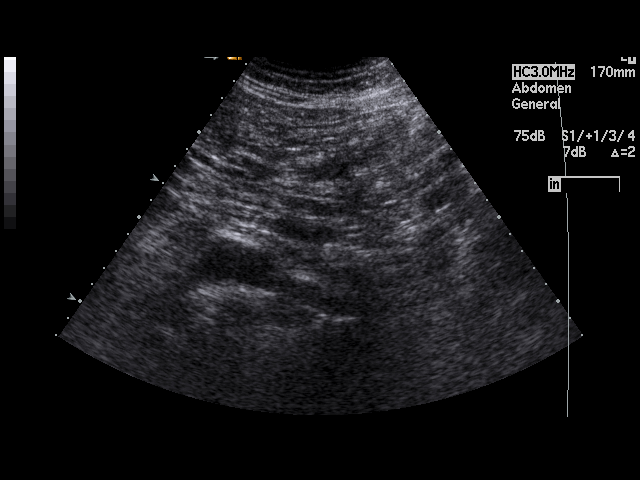
[im 23/26]
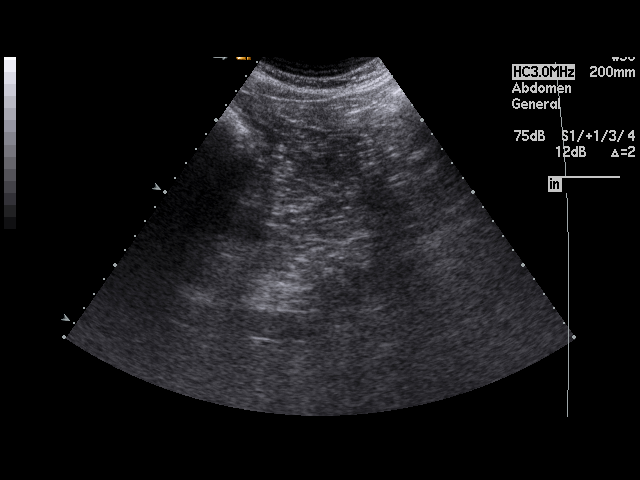
[im 26/26]
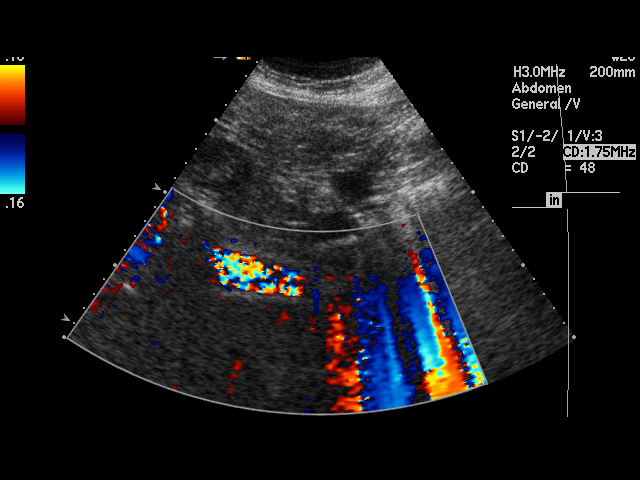

[17 of 25 positions shown; findings below may reference images not displayed]

PROCEDURE:     EHTIQAD - EHTIQAD AORTA  - [DATE]  [DATE]

RESULT:     Abdominal aortic ultrasound is compared to the previous study of

There is aneurysmal dilation of the distal abdominal aorta showing a maximal
AP dimension of 3.06 cm with a transverse dimension of 2.86 cm. The previous
study showed AP dimension of 3.06 and transverse dimensions of 2.86 as well.
The iliacs measure up to 1.32 cm diameter in the right common iliac and
cm in the left common iliac.
IMPRESSION: Infrarenal abdominal aortic aneurysm appears stable as compared to the
previous exam.

## 2012-11-10 ENCOUNTER — Ambulatory Visit: Payer: Self-pay | Admitting: Family Medicine

## 2012-11-10 IMAGING — US ULTRASOUND AORTA
1 series · 14 of 25 positions shown · non-contrast
Comparison: [DATE]

REASON FOR EXAM: known AAA      Hx kidney stones
COMMENTS:

PROCEDURE:     US  - US AORTA  - [DATE] [DATE]
RESULT:     Ultrasound aorta
INDICATION: Evaluate for AAA
TECHNIQUE: Multiple gray-scale and color Doppler images of the abdominal
aorta obtained.

[Series 1: ultrasound aorta · 0.35mm/px · 14 of 36 slices shown]
[im 1/36]
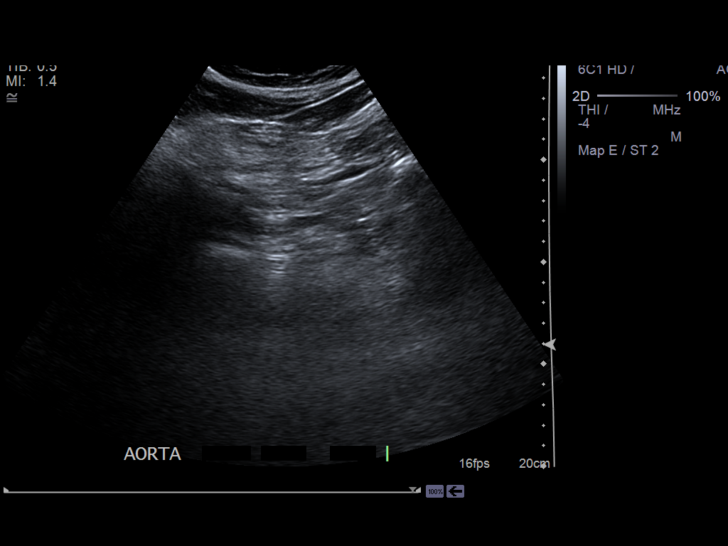
[im 3/36]
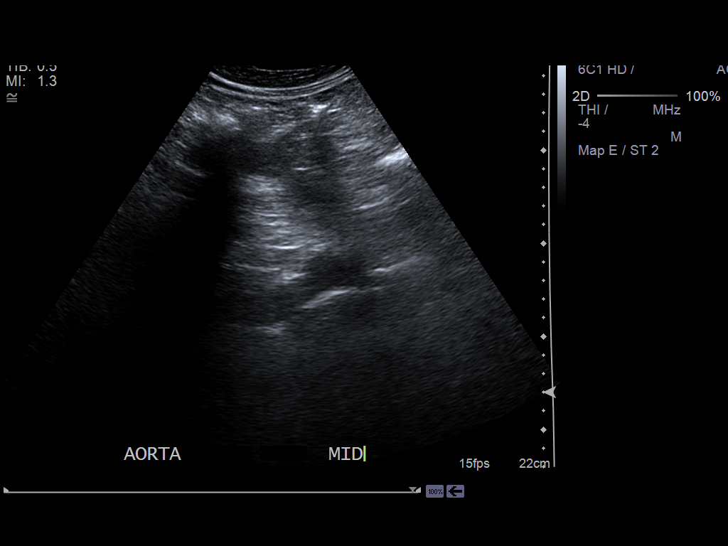
[im 6/36]
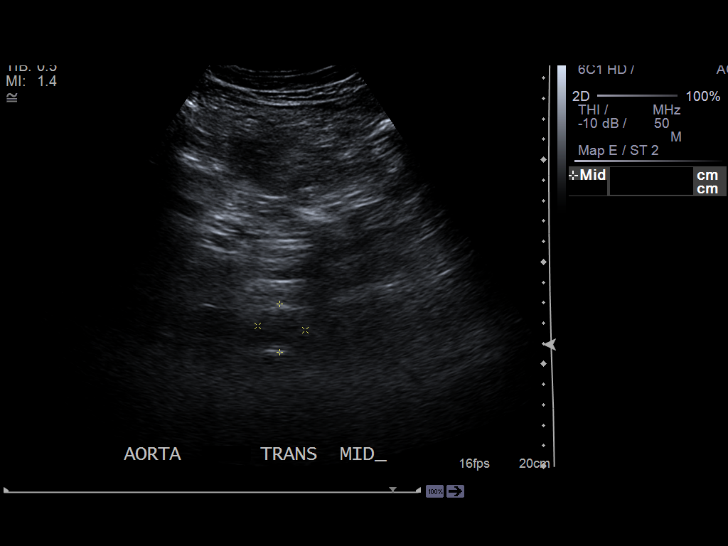
[im 9/36]
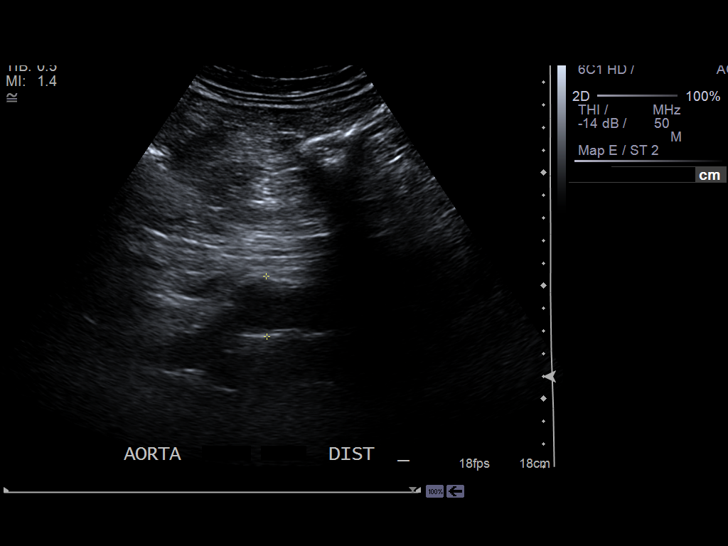
[im 12/36]
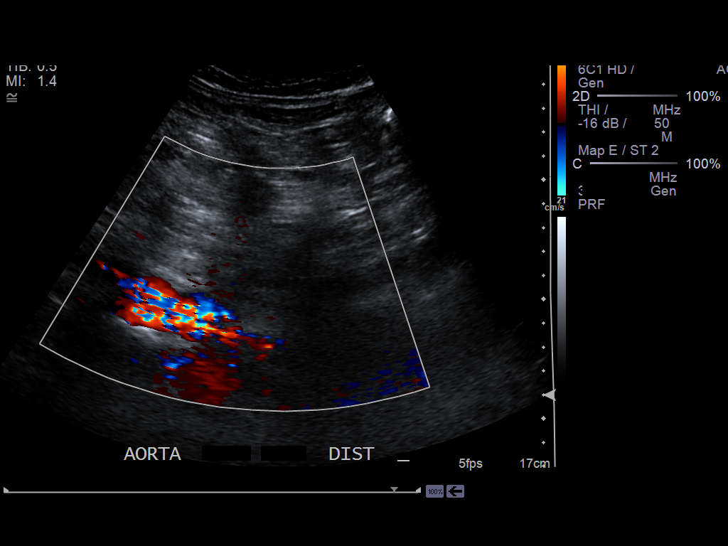
[im 14/36]
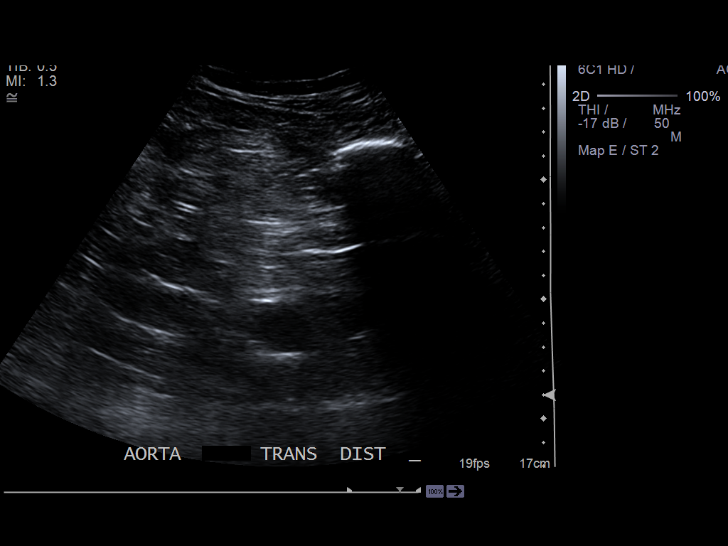
[im 17/36]
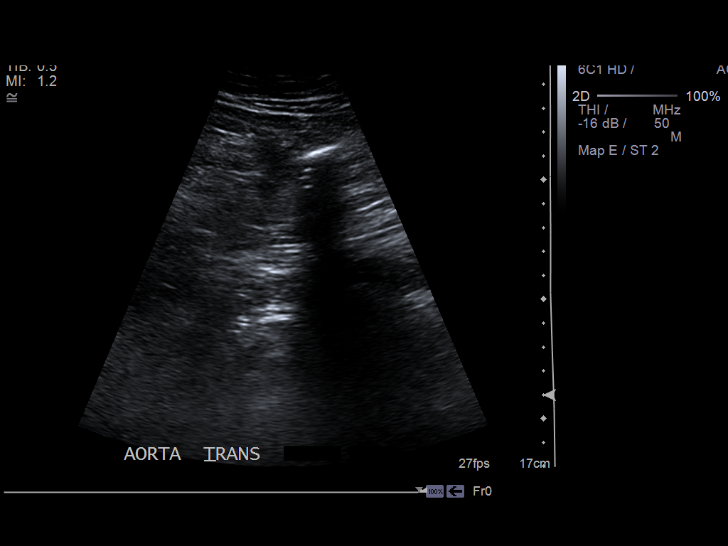
[im 19/36]
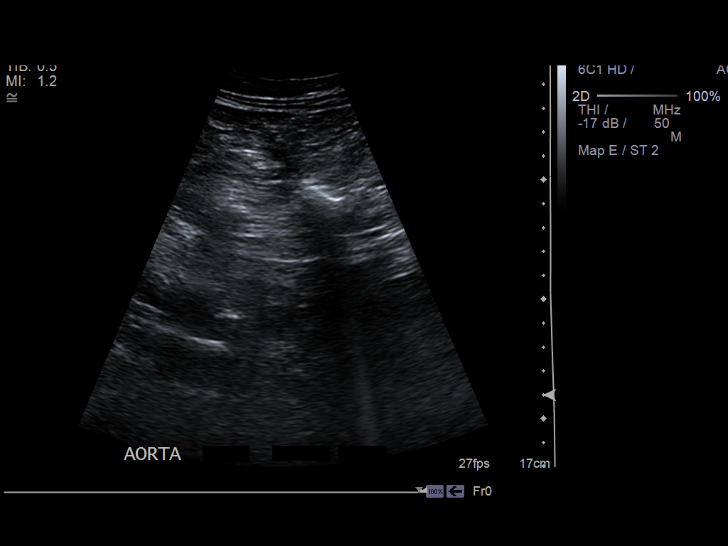
[im 22/36]
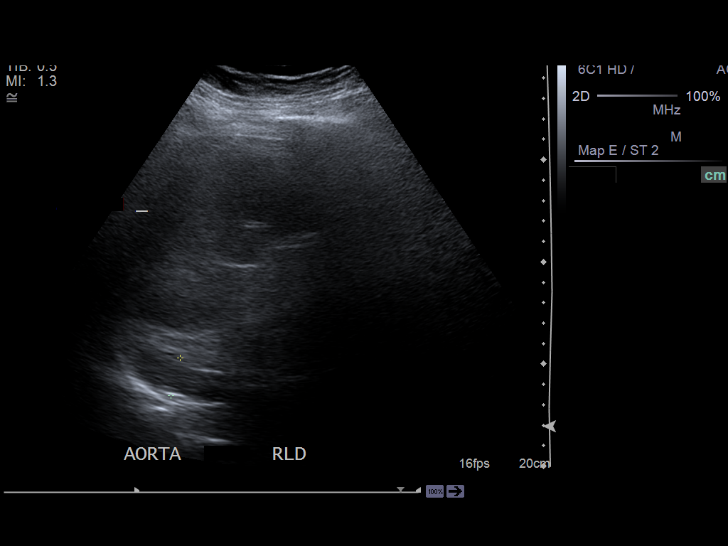
[im 24/36]
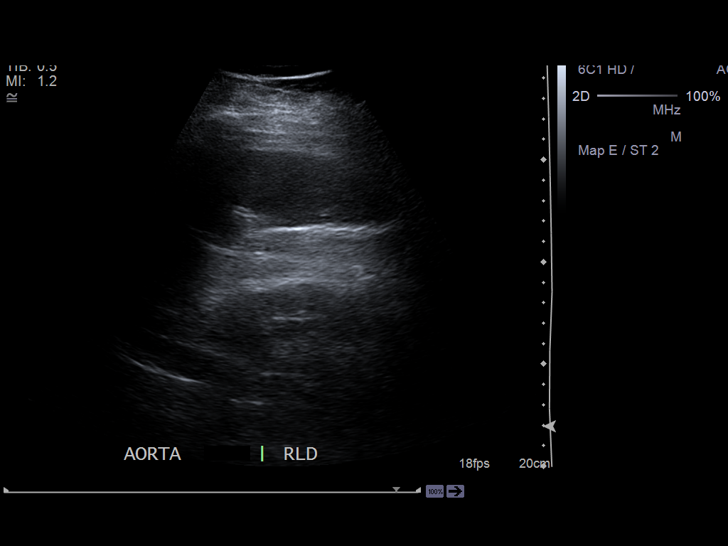
[im 27/36]
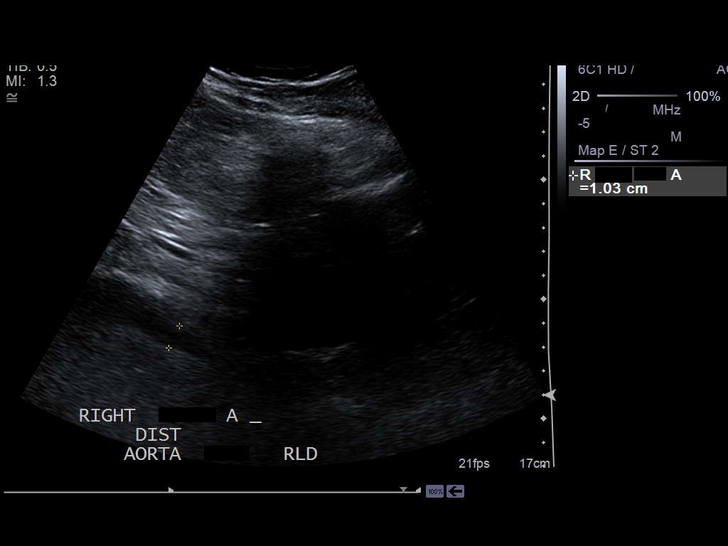
[im 30/36]
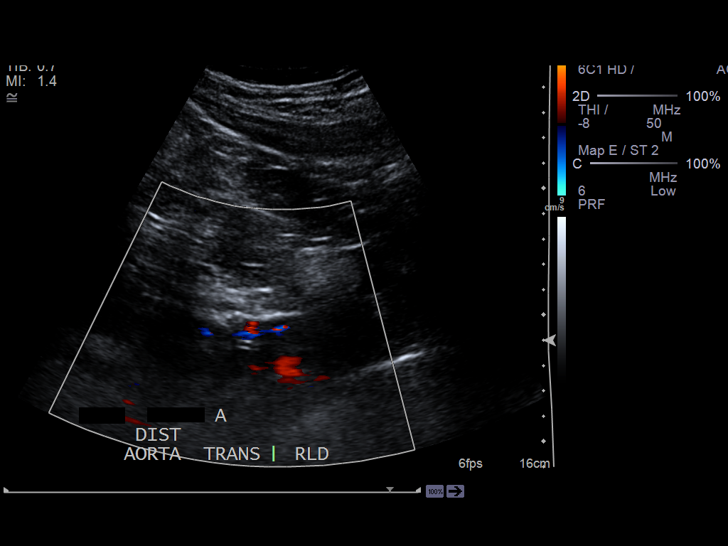
[im 33/36]
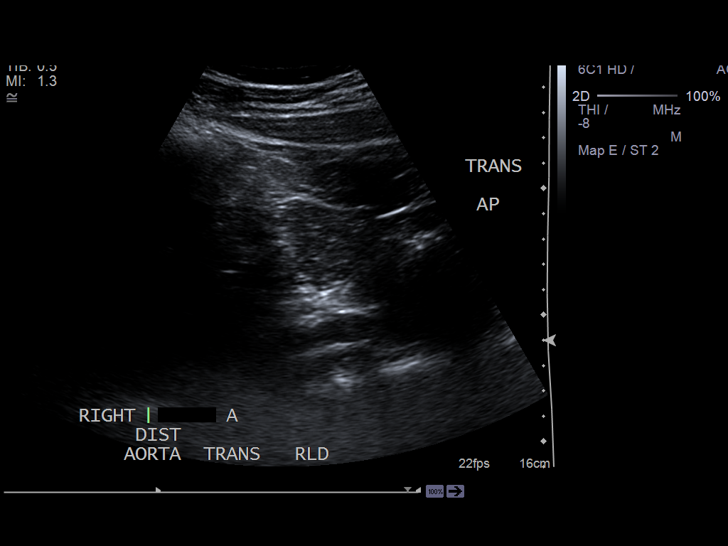
[im 36/36]
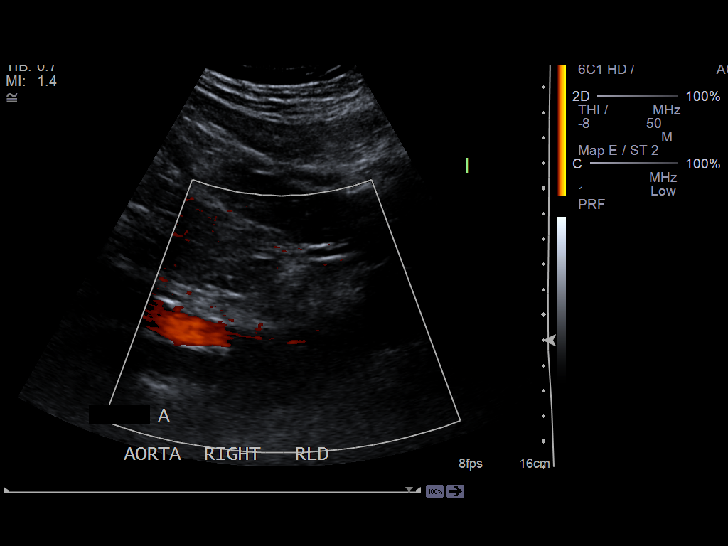

[14 of 25 positions shown; findings below may reference images not displayed]

FINDINGS: The proximal segment of the abdominal aorta measures 2.1 x 2.1 cm, the mid
abdominal aorta measures 2.4 x 2.3 cm, and the distal abdominal aorta
measures 2.7 x 3.3 cm in axial dimensions.

The common iliac arteries are normal in caliber without evidence of focal
aneurysmal dilatation. The right common iliac artery measures 1.4 x 1.2 cm.
The left common iliac artery measures 1.1 x 1.2 cm.
IMPRESSION: 1. Mild stable infrarenal abdominal aortic aneurysm measuring 2.7 x 3.3 cm.

[REDACTED]

## 2012-11-10 IMAGING — US US RENAL KIDNEY
1 series · 14 of 25 positions shown · non-contrast
Comparison: none

REASON FOR EXAM: known AAA      Hx kidney stones
COMMENTS:

[Series 1: us renal kidney · 0.31mm/px · 14 of 48 slices shown]
[im 1/48]
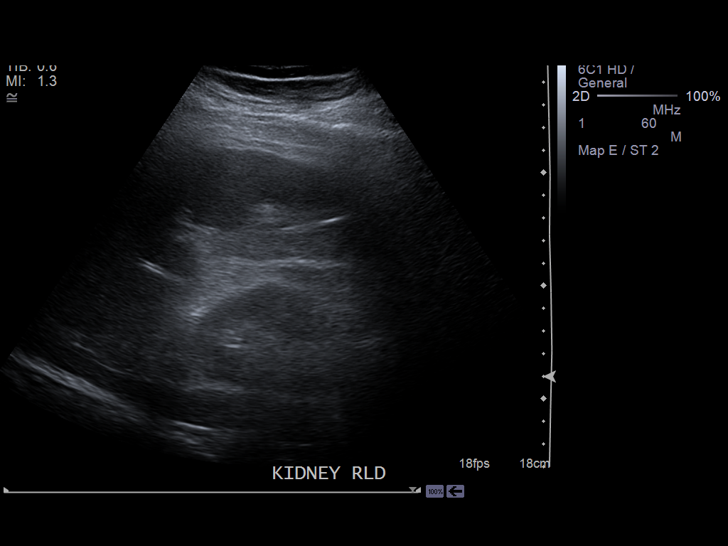
[im 4/48]
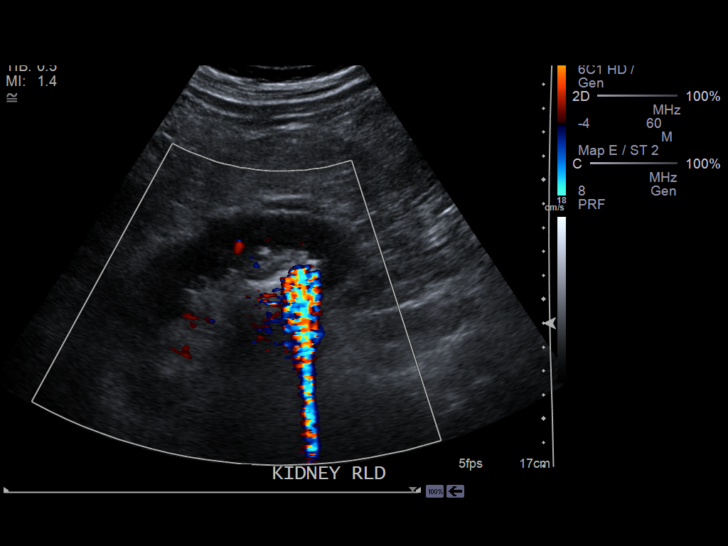
[im 8/48]
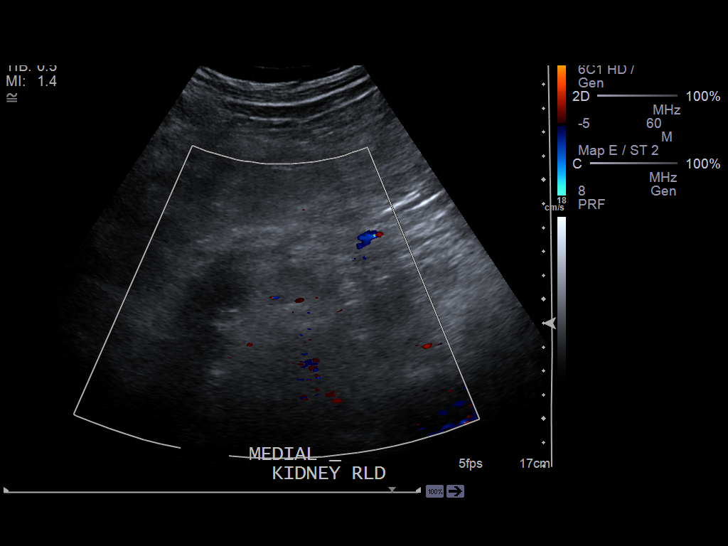
[im 12/48]
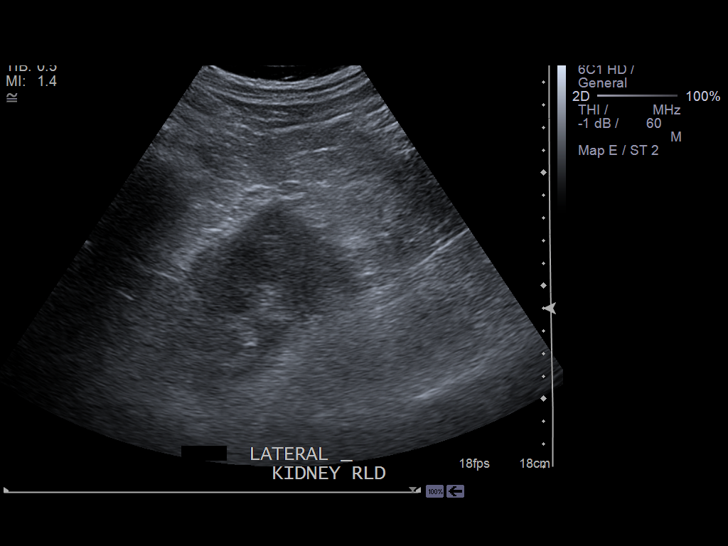
[im 16/48]
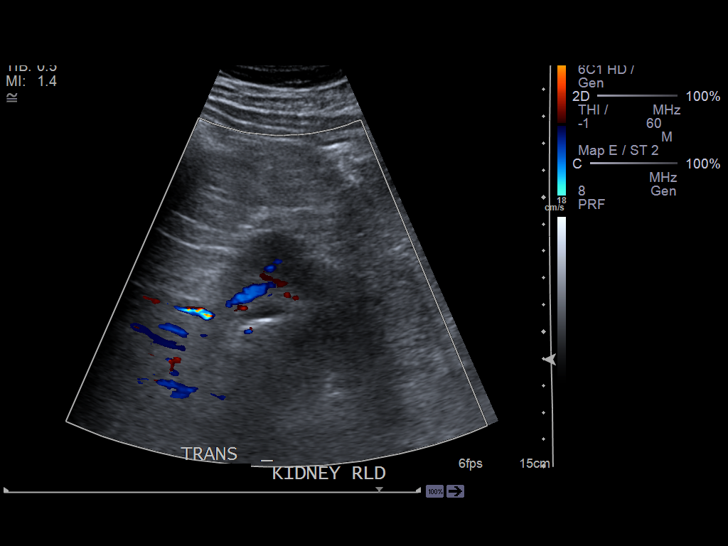
[im 18/48]
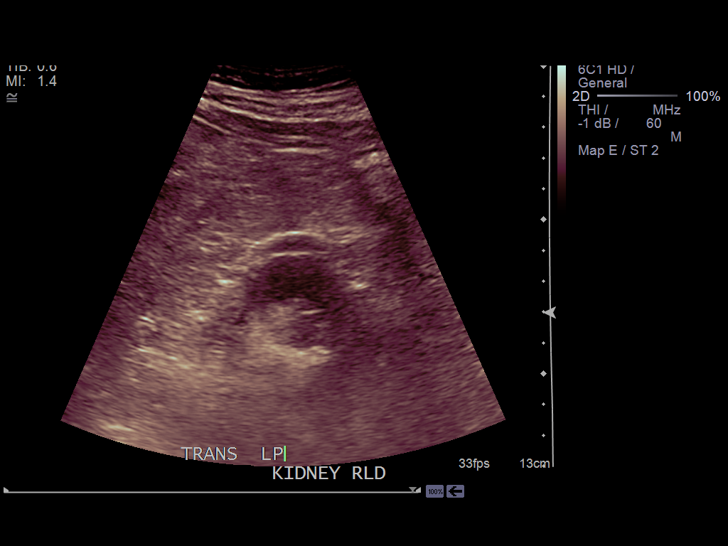
[im 22/48]
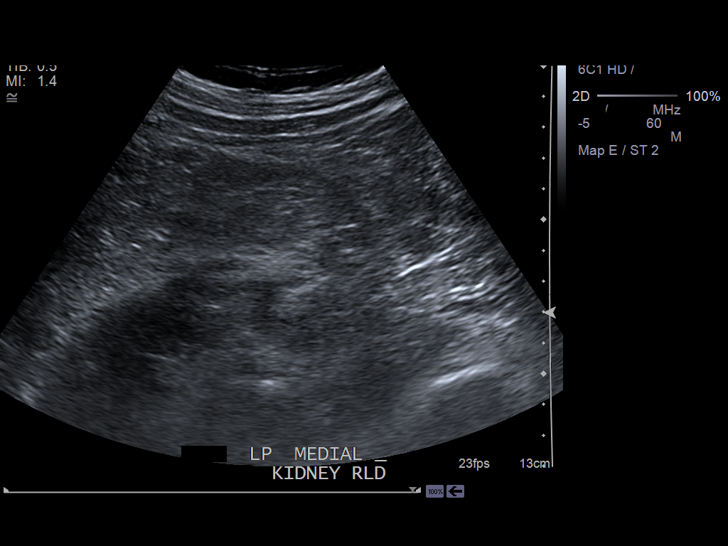
[im 26/48]
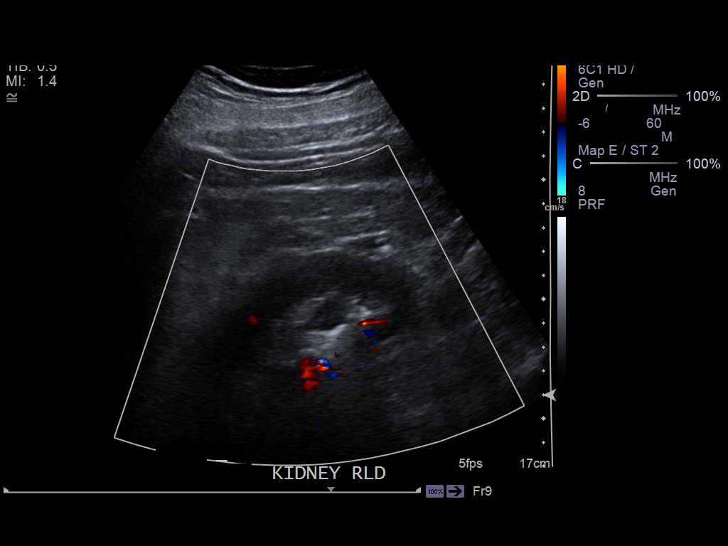
[im 30/48]
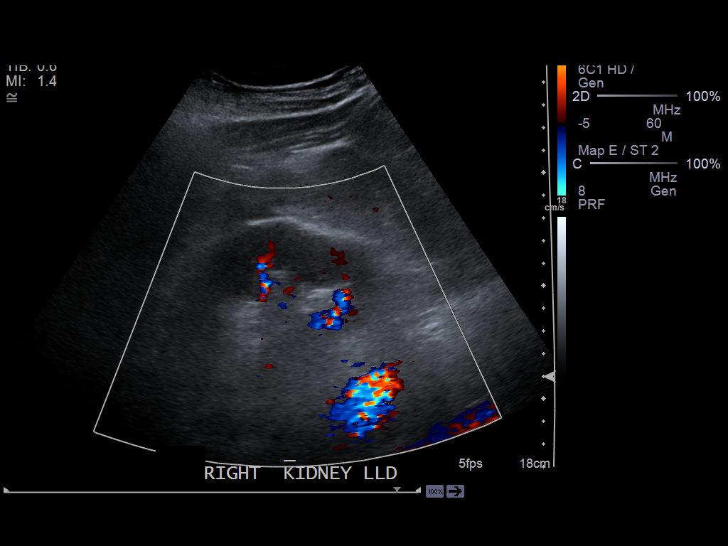
[im 32/48]
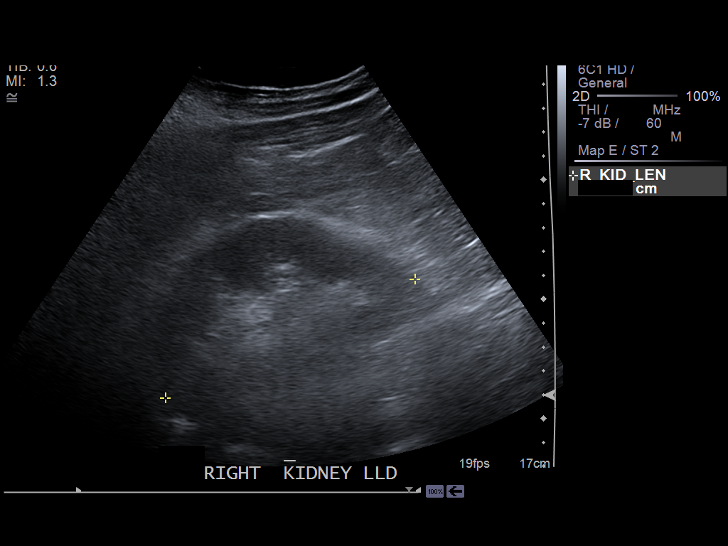
[im 36/48]
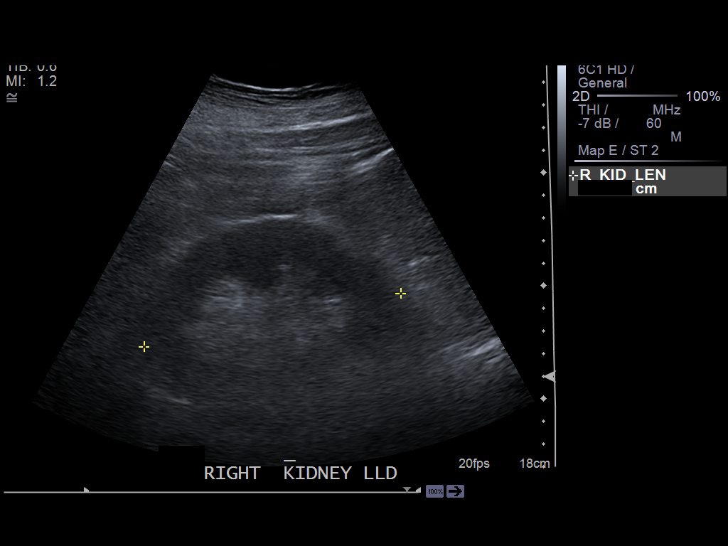
[im 40/48]
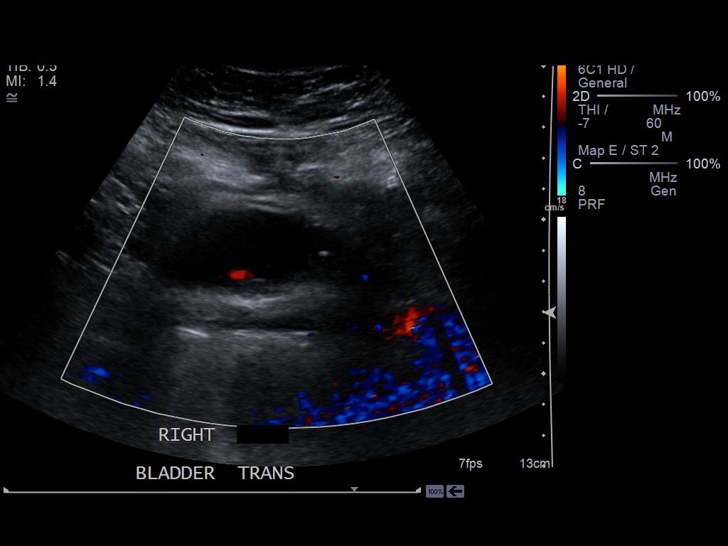
[im 44/48]
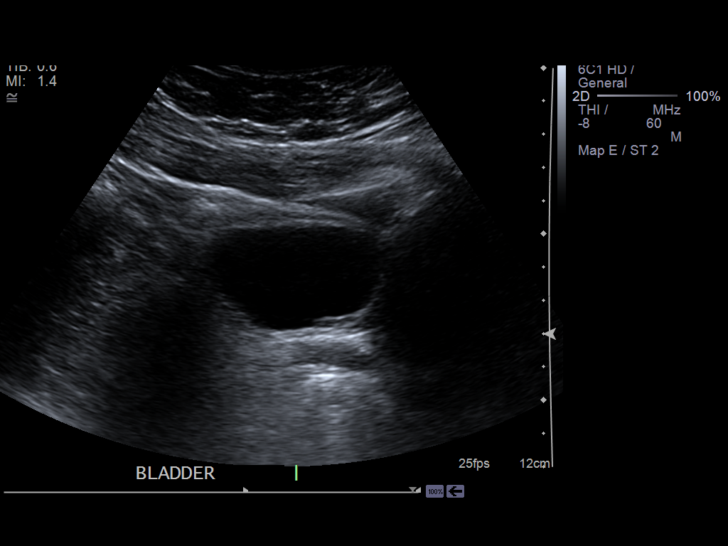
[im 48/48]
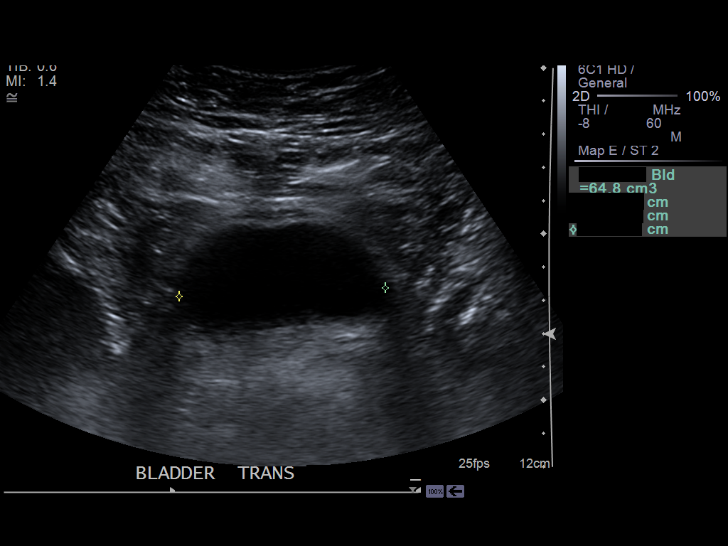

[14 of 25 positions shown; findings below may reference images not displayed]

PROCEDURE:     US  - US KIDNEY  - [DATE] [DATE]

RESULT:     Comparison: None

Technique and findings: Multiple gray-scale and color doppler images of the
kidneys were obtained.

The right kidney measures 12.5 x 6.9 x 8.3 cm and the left kidney measures
11.6 x 5.7 x 5.7 cm. The kidneys are normal in echogenicity. There is no
hydronephrosis.  There are bilateral nephrolithiasis. There is a hyperechoic
left renal mass measuring 1.3 x 1.4 x 1.3 cm which may resent angiomyolipoma
versus hemorrhagic cyst versus a proteinaceous cyst. There is no free fluid
in the region of the renal fossa.

Bilateral ureteral jets are noted. Prevoid bladder volume is 58.2 cc.
Postvoid bladder volume is 64.8 cc.
IMPRESSION: 1. Bilateral nephrolithiasis without obstructive uropathy.
2.  There is a hyperechoic left renal mass measuring 1.3 x 1.4 x 1.3 cm
which may resent angiomyolipoma versus hemorrhagic cyst versus a
proteinaceous cy[REDACTED]

## 2013-05-16 ENCOUNTER — Observation Stay: Payer: Self-pay | Admitting: Internal Medicine

## 2013-05-16 LAB — BASIC METABOLIC PANEL
Anion Gap: 5 — ABNORMAL LOW (ref 7–16)
BUN: 24 mg/dL — ABNORMAL HIGH (ref 7–18)
Calcium, Total: 9.1 mg/dL (ref 8.5–10.1)
Chloride: 105 mmol/L (ref 98–107)
Co2: 28 mmol/L (ref 21–32)
Creatinine: 1.2 mg/dL (ref 0.60–1.30)
EGFR (African American): >60
EGFR (Non-African Amer.): >60
Glucose: 113 mg/dL — ABNORMAL HIGH (ref 65–99)
Osmolality: 281 (ref 275–301)
Potassium: 4.5 mmol/L (ref 3.5–5.1)
Sodium: 138 mmol/L (ref 136–145)

## 2013-05-16 LAB — CBC
HCT: 46.8 % (ref 40.0–52.0)
HGB: 15.2 g/dL (ref 13.0–18.0)
MCH: 28.3 pg (ref 26.0–34.0)
MCV: 87 fL (ref 80–100)
RBC: 5.37 10*6/uL (ref 4.40–5.90)
RDW: 13.6 % (ref 11.5–14.5)

## 2013-05-16 LAB — TROPONIN I
Troponin-I: <0.02 ng/mL
Troponin-I: <0.02 ng/mL

## 2013-05-16 LAB — CK TOTAL AND CKMB (NOT AT ARMC)
CK, Total: 230 U/L (ref 35–232)
CK, Total: 175 U/L (ref 35–232)

## 2013-05-16 IMAGING — CR DG CHEST 1V PORT
1 series · 1 of 1 positions shown · non-contrast
Comparison: none

REASON FOR EXAM: pain
COMMENTS:

PROCEDURE:     DXR - DXR PORTABLE CHEST SINGLE VIEW  - [DATE] [DATE]
RESULT:     The lungs are clear. The heart and pulmonary vessels are normal.
The bony and mediastinal structures are unremarkable. There is no effusion.
There is no pneumothorax or evidence of congestive failure.

[ap]
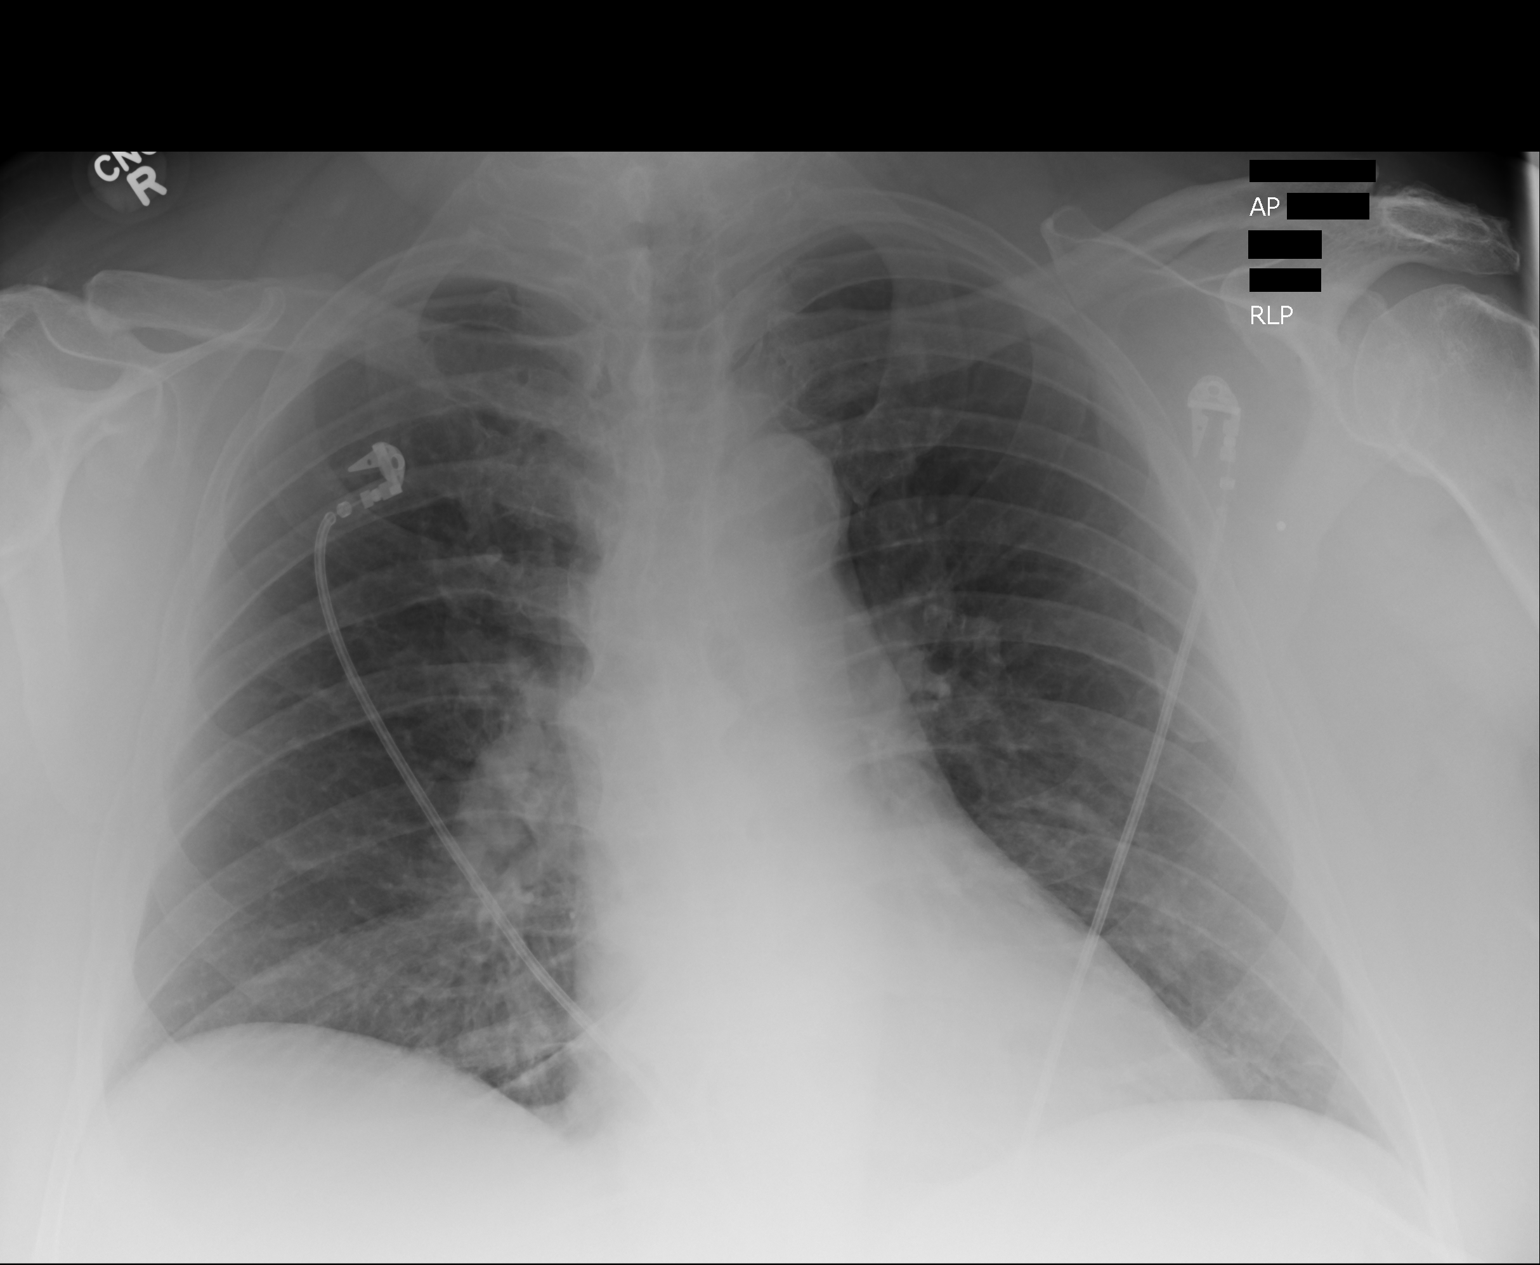

[1 of 1 positions shown; findings below may reference images not displayed]

IMPRESSION: No acute cardiopulmonary disease.

[REDACTED]

## 2013-05-17 DIAGNOSIS — R079 Chest pain, unspecified: Secondary | ICD-10-CM

## 2013-05-17 LAB — CK TOTAL AND CKMB (NOT AT ARMC): CK-MB: 1.7 ng/mL (ref 0.5–3.6)

## 2013-05-17 LAB — TROPONIN I: Troponin-I: <0.02 ng/mL

## 2013-11-29 ENCOUNTER — Ambulatory Visit: Payer: Self-pay | Admitting: Family Medicine

## 2013-11-29 IMAGING — US ULTRASOUND RETROPERITONEAL COMPLETE
1 series · 13 of 25 positions shown · non-contrast
Comparison: CT abdomen pelvis [DATE]

CLINICAL DATA: Follow-up of known abdominal aortic aneurysm.

EXAM:
RETROPERITONEAL ULTRASOUND COMPLETE
TECHNIQUE: Ultrasound examination of the abdominal aorta was performed to
evaluate for abdominal aortic aneurysm. The common iliac arteries,
IVC, and kidneys were also evaluated.

[Series 1: ultrasound retroperitoneal complete · 0.45mm/px · 13 of 73 slices shown]
[im 1/73]
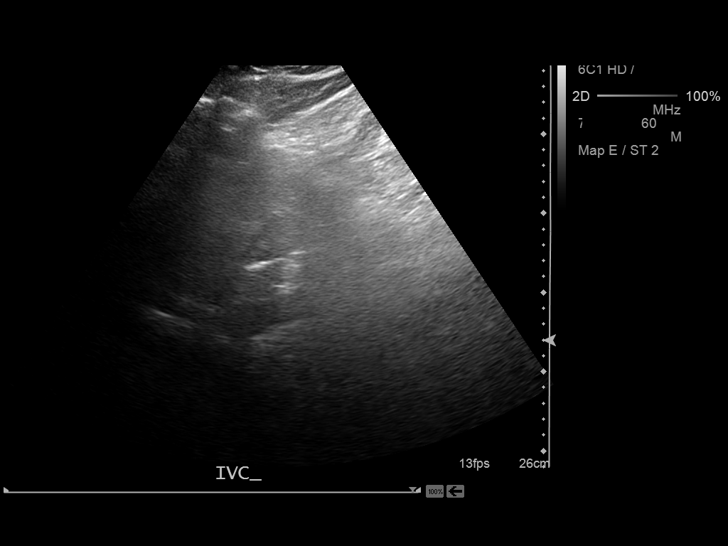
[im 7/73]
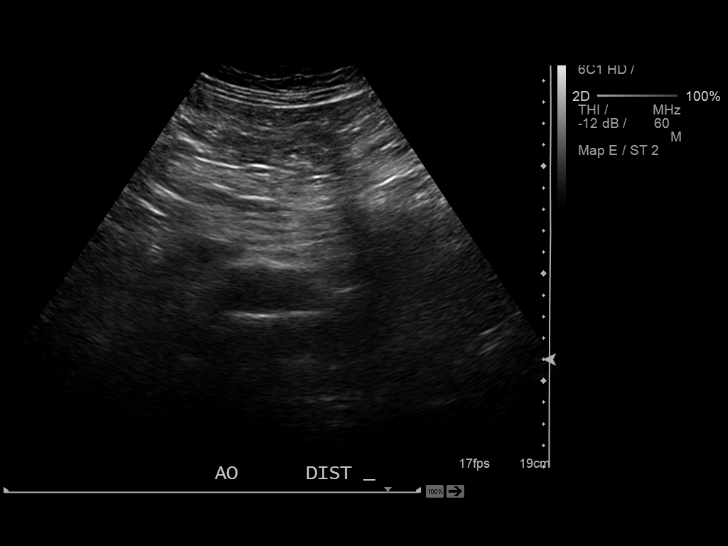
[im 13/73]
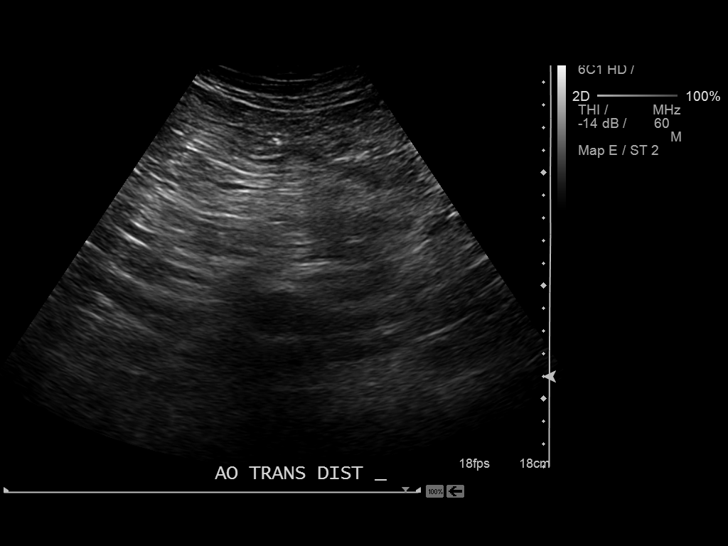
[im 19/73]
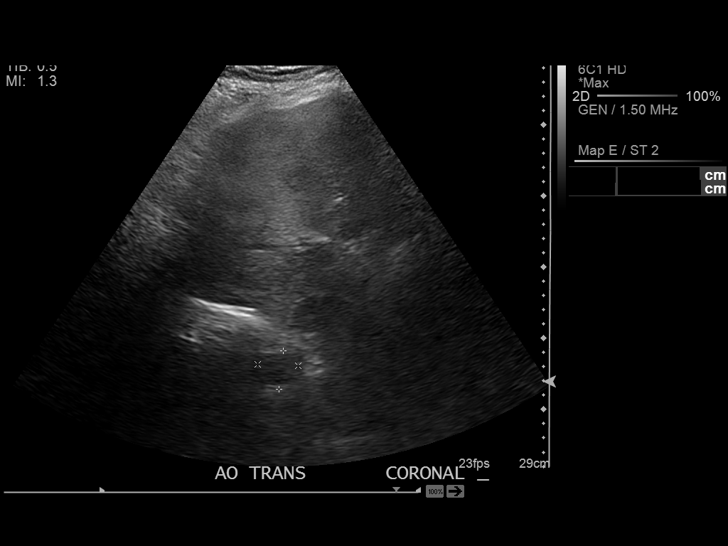
[im 25/73]
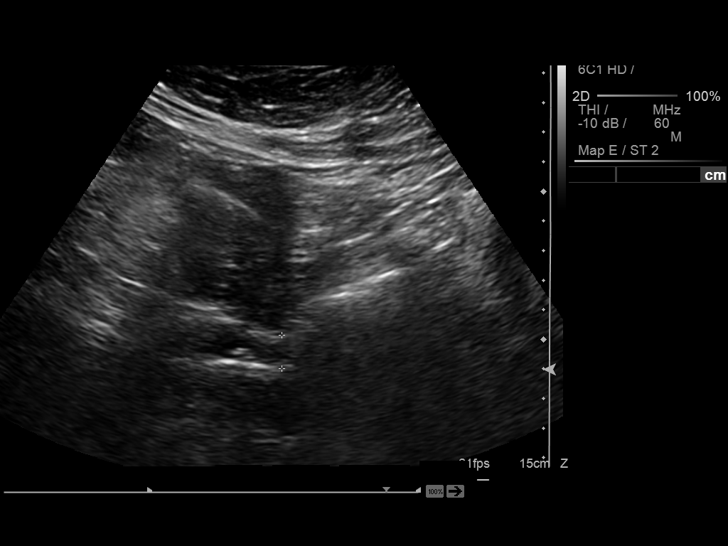
[im 31/73]
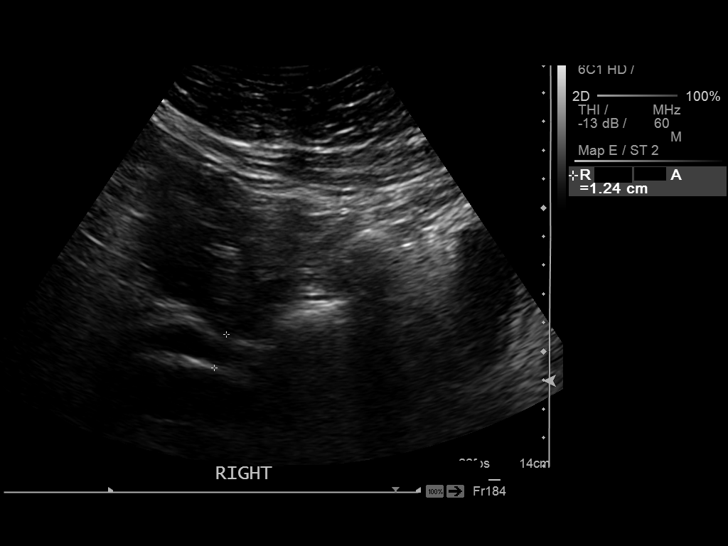
[im 37/73]
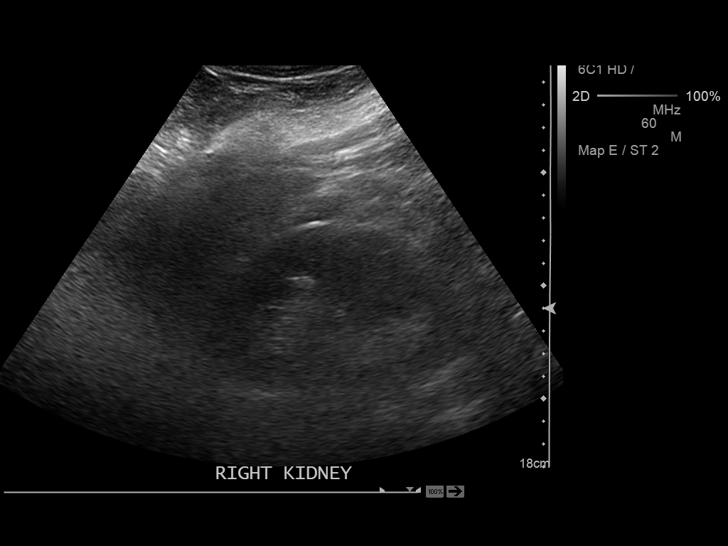
[im 43/73]
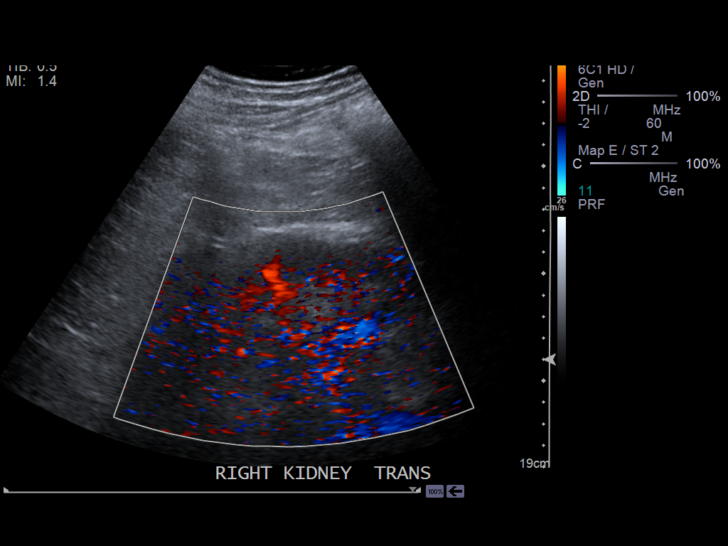
[im 49/73]
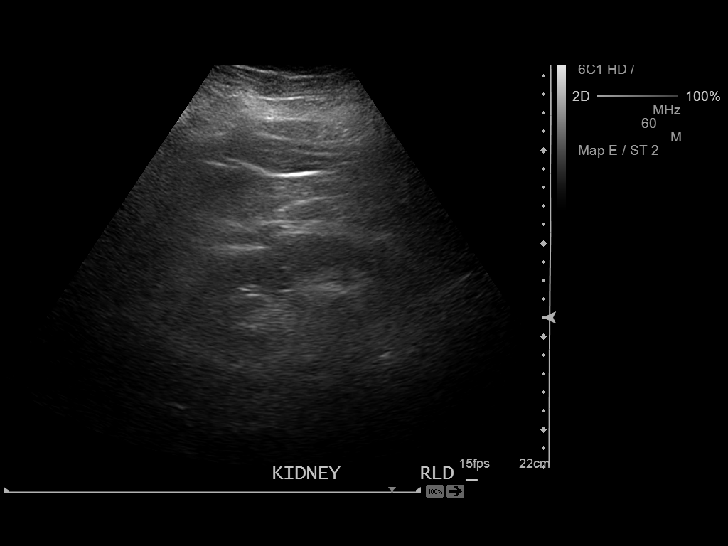
[im 55/73]
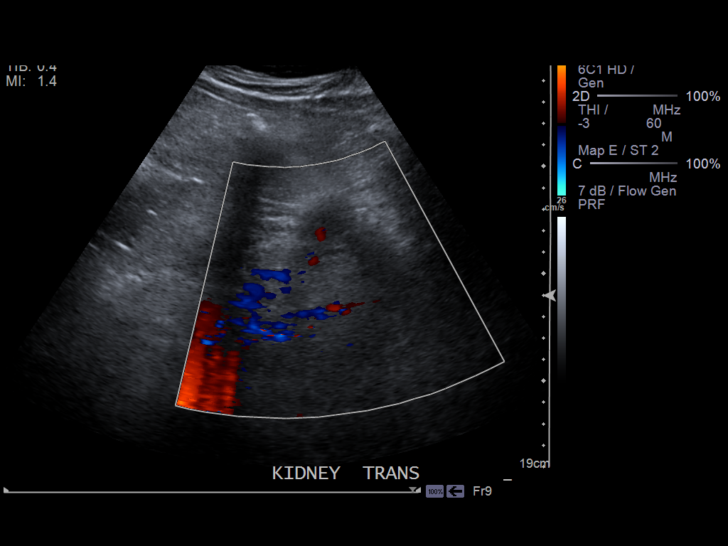
[im 61/73]
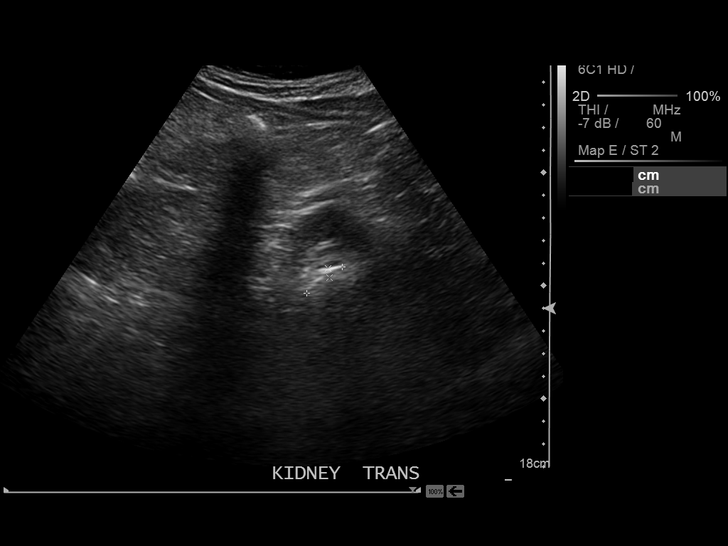
[im 67/73]
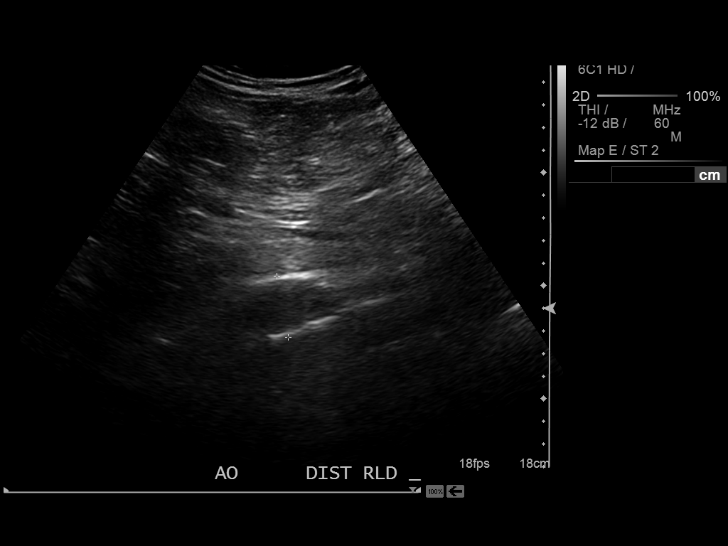
[im 73/73]
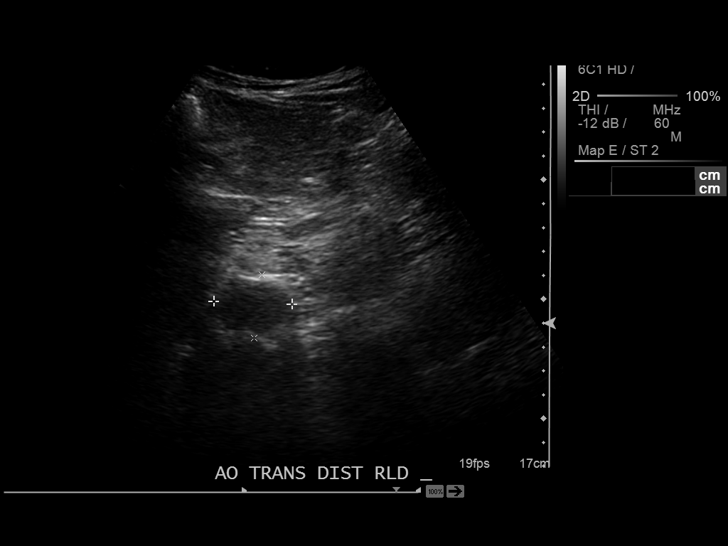

[13 of 25 positions shown; findings below may reference images not displayed]

FINDINGS: This exam is technically challenging due to patient body habitus and
bowel gas.

Abdominal Aorta

Focal ectasia of the distal abdominal aorta is again seen.

Maximum AP

Diameter: The maximum AP diameter of the abdominal aorta involves
the distal abdominal aorta and measures 2.9 cm AP diameter (measured
outer wall to outer wall).

Maximum TRV

Diameter: Maximum transverse diameter at the level of the distal
abdominal aorta is 3.3 cm.

Right Common Iliac Artery

No aneurysm identified.  Measures 1.4 cm.

Left Common Iliac Artery

No aneurysm identified.  Measures 1.2 Cm.

IVC

No abnormality visualized.

Right Kidney

Length: 11.6 cm. Echogenicity within normal limits. No mass or
hydronephrosis visualized.

Left Kidney

Length: 13.2 cm. Echogenicity within normal limits. Lower pole 2.0 x
0.4 x 1.3 cm stone is visualized. No mass or hydronephrosis
visualized.
IMPRESSION: 1. Technically challenging due to body habitus and bowel gas. The
distal abdominal aorta measures a maximum AP diameter of 2.9 cm and
a maximum transverse diameter of 3.3 cm. This area of focal
ectasia/early aneurysm formation is unchanged compared to the prior
CT of [DATE] in the AP dimension. The transverse measurement
of the distal abdominal aorta measures slightly larger on today's
study compared to prior CT. (Measurements of the distal abdominal
aorta on prior CT were 2.98 cm AP diameter and 2.6 cm transverse
diameter).

2. Left renal stone, nonobstructing.

## 2013-12-19 ENCOUNTER — Encounter: Payer: Self-pay | Admitting: *Deleted

## 2014-01-12 ENCOUNTER — Encounter: Payer: Self-pay | Admitting: General Surgery

## 2014-01-12 ENCOUNTER — Ambulatory Visit (INDEPENDENT_AMBULATORY_CARE_PROVIDER_SITE_OTHER): Payer: Medicare HMO | Admitting: General Surgery

## 2014-01-12 VITALS — BP 134/74 | HR 76 | Resp 16 | Ht 69.0 in | Wt 295.0 lb

## 2014-01-12 DIAGNOSIS — K439 Ventral hernia without obstruction or gangrene: Secondary | ICD-10-CM

## 2014-01-12 DIAGNOSIS — M6200 Separation of muscle (nontraumatic), unspecified site: Secondary | ICD-10-CM

## 2014-01-12 DIAGNOSIS — K429 Umbilical hernia without obstruction or gangrene: Secondary | ICD-10-CM

## 2014-01-12 NOTE — Progress Notes (Deleted)
Patient ID: [name] [name], male   DOB: [birth year], 73 y.o.   MRN: [id number]  Chief Complaint  Patient presents with  . Other    hiatal hernia    HPI [name] [name] is a 73 y.o. male here for possible hiatal hernia HPI  Past Medical History  Diagnosis Date  . Hypertension   . Hyperlipidemia   . Gout   . Diabetes mellitus without complication     Past Surgical History  Procedure Laterality Date  . Rotator cuff repair  [id number]  . Carpal tunnel release      No family history on file.  Social History History  Substance Use Topics  . Smoking status: Former Smoker -- 1.00 packs/day for 25 years    Types: Cigarettes  . Smokeless tobacco: Never Used  . Alcohol Use: No    No Known Allergies  Current Outpatient Prescriptions  Medication Sig Dispense Refill  . amLODipine (NORVASC) 5 MG tablet       . aspirin 81 MG tablet Take 81 mg by mouth daily.      . cyanocobalamin 500 MCG tablet Take 500 mcg by mouth daily.      . ergocalciferol (VITAMIN D2) 50000 UNITS capsule Take 50,000 Units by mouth every 30 (thirty) days.      [name] gabapentin (NEURONTIN) 300 MG capsule Take 300 mg by mouth 4 (four) times daily.       [name] lisinopril (PRINIVIL,ZESTRIL) 20 MG tablet Take 20 mg by mouth daily.       . metFORMIN (GLUCOPHAGE) 1000 MG tablet Take 1,000 mg by mouth 2 (two) times daily with a meal.      . Misc Natural Products (BLACK CHERRY CONCENTRATE PO) Take 750 mg by mouth.      . potassium citrate (UROCIT-K) 10 MEQ (1080 MG) SR tablet Take 20 mEq by mouth 2 (two) times daily.       . simethicone (MYLICON) 125 MG chewable tablet Chew 125 mg by mouth every 6 (six) hours as needed for flatulence.      . simvastatin (ZOCOR) 40 MG tablet Take 40 mg by mouth daily.        No current facility-administered medications for this visit.    Review of Systems Review of Systems  Constitutional: Negative.   HENT: Negative.   Respiratory: Negative.     There were no vitals taken  for this visit.  Physical Exam Physical Exam  Data Reviewed ***  Assessment    ***    Plan    ***    Ref & PCP: Dr [name]    [name] 01/12/2014, 10:01 AM

## 2014-01-12 NOTE — Patient Instructions (Signed)
Patient to return as needed. 

## 2014-01-12 NOTE — Progress Notes (Signed)
Patient ID: [name] [name], male   DOB: Aug 16, 1941, 74 y.o.   MRN: [id number]  Chief Complaint  Patient presents with  . Other    hiatal hernia    HPI [name] [name] is a 73 y.o. male here today for a evaluation for abdominal wall hernia. Patient states the hernia has been there for more than 20 years, previously having been evaluated by [name] [name], M.D. prior to his retirement in the mid 1990s.   Patient noticed a knot above the umbilical area about one year ago that has not changed in size. He originally told the nurse at the area had enlarged in size, but when I questioned him he reported no change.  The patient reports discomfort when he bends at the waist. HPI  Past Medical History  Diagnosis Date  . Hypertension   . Hyperlipidemia   . Gout   . Diabetes mellitus without complication   . Kidney stones     Past Surgical History  Procedure Laterality Date  . Rotator cuff repair Right 1996,1997,2000  . Carpal tunnel release Right     Family History  Problem Relation Age of Onset  . Colon polyps Brother   . Lung cancer Brother   . Throat cancer Father     Social History History  Substance Use Topics  . Smoking status: Former Smoker -- 1.00 packs/day for 25 years    Types: Cigarettes  . Smokeless tobacco: Never Used  . Alcohol Use: No    No Known Allergies  Current Outpatient Prescriptions  Medication Sig Dispense Refill  . acetaminophen (TYLENOL) 500 MG tablet Take 500 mg by mouth every 6 (six) hours as needed.      [name] amLODipine (NORVASC) 5 MG tablet       . aspirin 81 MG tablet Take 81 mg by mouth daily.      . cyanocobalamin 500 MCG tablet Take 500 mcg by mouth daily.      . ergocalciferol (VITAMIN D2) 50000 UNITS capsule Take 50,000 Units by mouth every 30 (thirty) days.      [name] gabapentin (NEURONTIN) 300 MG capsule Take 300 mg by mouth 4 (four) times daily.       [name] lisinopril (PRINIVIL,ZESTRIL) 20 MG tablet Take 20 mg by mouth daily.       . metFORMIN  (GLUCOPHAGE) 1000 MG tablet Take 1,000 mg by mouth 2 (two) times daily with a meal.      . Misc Natural Products (BLACK CHERRY CONCENTRATE PO) Take 750 mg by mouth.      . potassium citrate (UROCIT-K) 10 MEQ (1080 MG) SR tablet Take 20 mEq by mouth 2 (two) times daily.       . simethicone (MYLICON) 125 MG chewable tablet Chew 125 mg by mouth every 6 (six) hours as needed for flatulence.      . simvastatin (ZOCOR) 40 MG tablet Take 40 mg by mouth daily.        No current facility-administered medications for this visit.    Review of Systems Review of Systems  Constitutional: Negative.   Respiratory: Negative.   Cardiovascular: Negative.     Blood pressure 134/74, pulse 76, resp. rate 16, height 5\' 9"  (1.753 m), weight 295 lb (133.811 kg).  Physical Exam Physical Exam  Constitutional: He is oriented to person, place, and time. He appears well-developed and well-nourished.  Eyes: Conjunctivae are normal. No scleral icterus.  Neck: Neck supple.  Cardiovascular: Normal rate, regular rhythm and normal heart sounds.  Pulmonary/Chest: Effort normal and breath sounds normal.  Abdominal: Soft. Normal appearance and bowel sounds are normal. There is no tenderness. A hernia (small hernia above his belly button.) is present.    Diastasis   Neurological: He is alert and oriented to person, place, and time.  Skin: Skin is warm and dry.  Diastasis  No palpable aortic enlargement.  Data Reviewed PCP notes of 11/24/2013 Most recent abdominal ultrasound dated 11/29/2013 showed a maximum diameter of 3.3 cm, unchanged from 2010.  Cardiac stress testing dated 05/17/2013 showed a small area of mild ischemia in the lateral territory. Ejection fraction 50%.  Chest x-ray dated 05/16/2013 was unremarkable.  Assessment    Diastasis recti, umbilical and epigastric hernia, asymptomatic.      Plan    Observation alone is indicated.      PCP: [name] [name], [name]    Edel Rivero W Seniyah [name] 01/12/2014,  2:11 PM

## 2014-04-20 ENCOUNTER — Other Ambulatory Visit: Payer: Self-pay | Admitting: Nurse Practitioner

## 2014-04-20 ENCOUNTER — Inpatient Hospital Stay: Payer: Self-pay | Admitting: Internal Medicine

## 2014-04-20 ENCOUNTER — Encounter: Payer: Self-pay | Admitting: Nurse Practitioner

## 2014-04-20 DIAGNOSIS — I214 Non-ST elevation (NSTEMI) myocardial infarction: Secondary | ICD-10-CM

## 2014-04-20 DIAGNOSIS — I509 Heart failure, unspecified: Secondary | ICD-10-CM

## 2014-04-20 LAB — CBC
HCT: 49 % (ref 40.0–52.0)
HGB: 15.5 g/dL (ref 13.0–18.0)
MCH: 28.6 pg (ref 26.0–34.0)
MCHC: 31.6 g/dL — AB (ref 32.0–36.0)
MCV: 91 fL (ref 80–100)
Platelet: 185 10*3/uL (ref 150–440)
RBC: 5.4 10*6/uL (ref 4.40–5.90)
RDW: 13.8 % (ref 11.5–14.5)
WBC: 13.7 10*3/uL — AB (ref 3.8–10.6)

## 2014-04-20 LAB — MAGNESIUM: Magnesium: 1.4 mg/dL — ABNORMAL LOW

## 2014-04-20 LAB — COMPREHENSIVE METABOLIC PANEL
ANION GAP: 9 (ref 7–16)
Albumin: 3.6 g/dL (ref 3.4–5.0)
Alkaline Phosphatase: 96 U/L
BILIRUBIN TOTAL: 0.5 mg/dL (ref 0.2–1.0)
BUN: 20 mg/dL — ABNORMAL HIGH (ref 7–18)
CHLORIDE: 106 mmol/L (ref 98–107)
Calcium, Total: 8.8 mg/dL (ref 8.5–10.1)
Co2: 32 mmol/L (ref 21–32)
Creatinine: 1.22 mg/dL (ref 0.60–1.30)
EGFR (Non-African Amer.): 60 — ABNORMAL LOW
GLUCOSE: 170 mg/dL — AB (ref 65–99)
OSMOLALITY: 299 (ref 275–301)
Potassium: 4 mmol/L (ref 3.5–5.1)
SGOT(AST): 33 U/L (ref 15–37)
SGPT (ALT): 35 U/L
SODIUM: 147 mmol/L — AB (ref 136–145)
TOTAL PROTEIN: 7.3 g/dL (ref 6.4–8.2)

## 2014-04-20 LAB — CK-MB
CK-MB: 6.1 ng/mL — ABNORMAL HIGH (ref 0.5–3.6)
CK-MB: 7.3 ng/mL — AB (ref 0.5–3.6)
CK-MB: 7 ng/mL — ABNORMAL HIGH (ref 0.5–3.6)
CK-MB: 5 ng/mL — ABNORMAL HIGH (ref 0.5–3.6)

## 2014-04-20 LAB — PRO B NATRIURETIC PEPTIDE: B-Type Natriuretic Peptide: 4414 pg/mL — ABNORMAL HIGH (ref 0–125)

## 2014-04-20 LAB — TROPONIN I
Troponin-I: 0.02 ng/mL
Troponin-I: 0.81 ng/mL — ABNORMAL HIGH
Troponin-I: 1.4 ng/mL — ABNORMAL HIGH
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
## 2014-04-21 ENCOUNTER — Inpatient Hospital Stay (HOSPITAL_COMMUNITY)
Admission: AD | Admit: 2014-04-21 | Discharge: 2014-04-25 | DRG: 280 | Disposition: A | Discharge status: Home / Self Care | Payer: Medicare PPO | Source: Other Acute Inpatient Hospital | Attending: Internal Medicine | Admitting: Internal Medicine

## 2014-04-21 ENCOUNTER — Other Ambulatory Visit: Payer: Self-pay | Admitting: Nurse Practitioner

## 2014-04-21 ENCOUNTER — Encounter (HOSPITAL_COMMUNITY): Payer: Self-pay

## 2014-04-21 DIAGNOSIS — I519 Heart disease, unspecified: Secondary | ICD-10-CM

## 2014-04-21 DIAGNOSIS — I214 Non-ST elevation (NSTEMI) myocardial infarction: Principal | ICD-10-CM | POA: Diagnosis present

## 2014-04-21 DIAGNOSIS — M109 Gout, unspecified: Secondary | ICD-10-CM | POA: Diagnosis present

## 2014-04-21 DIAGNOSIS — E119 Type 2 diabetes mellitus without complications: Secondary | ICD-10-CM | POA: Diagnosis present

## 2014-04-21 DIAGNOSIS — N179 Acute kidney failure, unspecified: Secondary | ICD-10-CM | POA: Diagnosis present

## 2014-04-21 DIAGNOSIS — I255 Ischemic cardiomyopathy: Secondary | ICD-10-CM

## 2014-04-21 DIAGNOSIS — I10 Essential (primary) hypertension: Secondary | ICD-10-CM | POA: Diagnosis present

## 2014-04-21 DIAGNOSIS — I5022 Chronic systolic (congestive) heart failure: Secondary | ICD-10-CM | POA: Diagnosis present

## 2014-04-21 DIAGNOSIS — I509 Heart failure, unspecified: Secondary | ICD-10-CM | POA: Diagnosis present

## 2014-04-21 DIAGNOSIS — G4733 Obstructive sleep apnea (adult) (pediatric): Secondary | ICD-10-CM

## 2014-04-21 DIAGNOSIS — N189 Chronic kidney disease, unspecified: Secondary | ICD-10-CM | POA: Diagnosis present

## 2014-04-21 DIAGNOSIS — Z79899 Other long term (current) drug therapy: Secondary | ICD-10-CM | POA: Diagnosis not present

## 2014-04-21 DIAGNOSIS — J9600 Acute respiratory failure, unspecified whether with hypoxia or hypercapnia: Secondary | ICD-10-CM | POA: Diagnosis present

## 2014-04-21 DIAGNOSIS — I129 Hypertensive chronic kidney disease with stage 1 through stage 4 chronic kidney disease, or unspecified chronic kidney disease: Secondary | ICD-10-CM | POA: Diagnosis present

## 2014-04-21 DIAGNOSIS — I161 Hypertensive emergency: Secondary | ICD-10-CM

## 2014-04-21 DIAGNOSIS — E669 Obesity, unspecified: Secondary | ICD-10-CM | POA: Diagnosis present

## 2014-04-21 DIAGNOSIS — E785 Hyperlipidemia, unspecified: Secondary | ICD-10-CM | POA: Diagnosis present

## 2014-04-21 DIAGNOSIS — R0609 Other forms of dyspnea: Secondary | ICD-10-CM

## 2014-04-21 DIAGNOSIS — I5023 Acute on chronic systolic (congestive) heart failure: Secondary | ICD-10-CM | POA: Diagnosis present

## 2014-04-21 DIAGNOSIS — Z87891 Personal history of nicotine dependence: Secondary | ICD-10-CM

## 2014-04-21 DIAGNOSIS — Z6839 Body mass index (BMI) 39.0-39.9, adult: Secondary | ICD-10-CM

## 2014-04-21 DIAGNOSIS — I059 Rheumatic mitral valve disease, unspecified: Secondary | ICD-10-CM | POA: Diagnosis present

## 2014-04-21 DIAGNOSIS — I25119 Atherosclerotic heart disease of native coronary artery with unspecified angina pectoris: Secondary | ICD-10-CM

## 2014-04-21 DIAGNOSIS — I2789 Other specified pulmonary heart diseases: Secondary | ICD-10-CM | POA: Diagnosis present

## 2014-04-21 DIAGNOSIS — I2510 Atherosclerotic heart disease of native coronary artery without angina pectoris: Secondary | ICD-10-CM | POA: Diagnosis present

## 2014-04-21 DIAGNOSIS — E1165 Type 2 diabetes mellitus with hyperglycemia: Secondary | ICD-10-CM

## 2014-04-21 DIAGNOSIS — I5021 Acute systolic (congestive) heart failure: Secondary | ICD-10-CM | POA: Diagnosis present

## 2014-04-21 HISTORY — DX: Chronic systolic (congestive) heart failure: I50.22

## 2014-04-21 HISTORY — DX: Atherosclerotic heart disease of native coronary artery without angina pectoris: I25.10

## 2014-04-21 LAB — CBC WITH DIFFERENTIAL/PLATELET
BASOS ABS: 0.1 10*3/uL (ref 0.0–0.1)
BASOS PCT: 1 %
Eosinophil #: 0.3 10*3/uL (ref 0.0–0.7)
Eosinophil %: 2.8 %
HCT: 43.2 % (ref 40.0–52.0)
HGB: 14 g/dL (ref 13.0–18.0)
Lymphocyte #: 2 10*3/uL (ref 1.0–3.6)
Lymphocyte %: 21.8 %
MCH: 29.4 pg (ref 26.0–34.0)
MCHC: 32.5 g/dL (ref 32.0–36.0)
MCV: 91 fL (ref 80–100)
MONOS PCT: 12.2 %
Monocyte #: 1.1 x10 3/mm — ABNORMAL HIGH (ref 0.2–1.0)
Neutrophil #: 5.7 10*3/uL (ref 1.4–6.5)
Neutrophil %: 62.2 %
PLATELETS: 169 10*3/uL (ref 150–440)
RBC: 4.77 10*6/uL (ref 4.40–5.90)
RDW: 13.8 % (ref 11.5–14.5)
WBC: 9.1 10*3/uL (ref 3.8–10.6)

## 2014-04-21 LAB — BASIC METABOLIC PANEL
ANION GAP: 6 — AB (ref 7–16)
BUN: 30 mg/dL — ABNORMAL HIGH (ref 7–18)
CALCIUM: 8.7 mg/dL (ref 8.5–10.1)
Chloride: 97 mmol/L — ABNORMAL LOW (ref 98–107)
Co2: 39 mmol/L — ABNORMAL HIGH (ref 21–32)
Creatinine: 1.39 mg/dL — ABNORMAL HIGH (ref 0.60–1.30)
GFR CALC AF AMER: 59 — AB
GFR CALC NON AF AMER: 51 — AB
Glucose: 129 mg/dL — ABNORMAL HIGH (ref 65–99)
OSMOLALITY: 291 (ref 275–301)
POTASSIUM: 3.7 mmol/L (ref 3.5–5.1)
Sodium: 142 mmol/L (ref 136–145)

## 2014-04-21 LAB — GLUCOSE, CAPILLARY: Glucose-Capillary: 115 mg/dL — ABNORMAL HIGH (ref 70–99)

## 2014-04-21 MED ORDER — HEPARIN (PORCINE) IN NACL 100-0.45 UNIT/ML-% IJ SOLN
1800.0000 [IU]/h | INTRAMUSCULAR | Status: DC
  Administered 2014-04-21: 1400 [IU]/h via INTRAVENOUS
  Administered 2014-04-22 (×2): 1800 [IU]/h via INTRAVENOUS
  Filled 2014-04-21 (×2): qty 250

## 2014-04-21 MED ORDER — SODIUM CHLORIDE 0.9 % IV SOLN
250.0000 mL | INTRAVENOUS | Status: DC | PRN
  Administered 2014-04-23: 250 mL via INTRAVENOUS

## 2014-04-21 MED ORDER — ACETAMINOPHEN 325 MG PO TABS
650.0000 mg | ORAL_TABLET | ORAL | Status: DC | PRN
Start: 2014-04-21 — End: 2014-04-25

## 2014-04-21 MED ORDER — LISINOPRIL 20 MG PO TABS
20.0000 mg | ORAL_TABLET | Freq: Every day | ORAL | Status: DC
  Administered 2014-04-21 – 2014-04-25 (×5): 20 mg via ORAL
  Filled 2014-04-21 (×5): qty 1

## 2014-04-21 MED ORDER — AMLODIPINE BESYLATE 5 MG PO TABS
5.0000 mg | ORAL_TABLET | Freq: Every day | ORAL | Status: DC
  Administered 2014-04-21 – 2014-04-25 (×5): 5 mg via ORAL
  Filled 2014-04-21 (×5): qty 1

## 2014-04-21 MED ORDER — SODIUM CHLORIDE 0.9 % IJ SOLN: 3.0000 mL | INTRAMUSCULAR | Status: DC | PRN

## 2014-04-21 MED ORDER — GABAPENTIN 300 MG PO CAPS
300.0000 mg | ORAL_CAPSULE | Freq: Three times a day (TID) | ORAL | Status: DC
  Administered 2014-04-21 – 2014-04-25 (×12): 300 mg via ORAL
  Filled 2014-04-21 (×13): qty 1

## 2014-04-21 MED ORDER — ATORVASTATIN CALCIUM 80 MG PO TABS
80.0000 mg | ORAL_TABLET | Freq: Every day | ORAL | Status: DC
  Administered 2014-04-21 – 2014-04-24 (×4): 80 mg via ORAL
  Filled 2014-04-21 (×5): qty 1

## 2014-04-21 MED ORDER — ASPIRIN EC 81 MG PO TBEC
81.0000 mg | DELAYED_RELEASE_TABLET | Freq: Every day | ORAL | Status: DC
  Administered 2014-04-22 – 2014-04-25 (×4): 81 mg via ORAL
  Filled 2014-04-21 (×4): qty 1

## 2014-04-21 MED ORDER — NITROGLYCERIN 0.4 MG SL SUBL: 0.4000 mg | SUBLINGUAL_TABLET | SUBLINGUAL | Status: DC | PRN

## 2014-04-21 MED ORDER — INSULIN ASPART 100 UNIT/ML ~~LOC~~ SOLN
0.0000 [IU] | Freq: Three times a day (TID) | SUBCUTANEOUS | Status: DC
  Administered 2014-04-23: 2 [IU] via SUBCUTANEOUS
  Administered 2014-04-24: 3 [IU] via SUBCUTANEOUS

## 2014-04-21 MED ORDER — COLCHICINE 0.6 MG PO TABS
0.6000 mg | ORAL_TABLET | Freq: Two times a day (BID) | ORAL | Status: DC
  Administered 2014-04-21 – 2014-04-25 (×8): 0.6 mg via ORAL
  Filled 2014-04-21 (×9): qty 1

## 2014-04-21 MED ORDER — ONDANSETRON HCL 4 MG/2ML IJ SOLN: 4.0000 mg | Freq: Four times a day (QID) | INTRAMUSCULAR | Status: DC | PRN

## 2014-04-21 MED ORDER — SODIUM CHLORIDE 0.9 % IJ SOLN
3.0000 mL | Freq: Two times a day (BID) | INTRAMUSCULAR | Status: DC
  Administered 2014-04-21 – 2014-04-25 (×7): 3 mL via INTRAVENOUS

## 2014-04-21 MED ORDER — CARVEDILOL 6.25 MG PO TABS
6.2500 mg | ORAL_TABLET | Freq: Two times a day (BID) | ORAL | Status: DC
  Administered 2014-04-21 – 2014-04-25 (×9): 6.25 mg via ORAL
  Filled 2014-04-21 (×10): qty 1

## 2014-04-21 MED ORDER — PANTOPRAZOLE SODIUM 40 MG PO TBEC
40.0000 mg | DELAYED_RELEASE_TABLET | Freq: Every day | ORAL | Status: DC
  Administered 2014-04-22 – 2014-04-25 (×4): 40 mg via ORAL
  Filled 2014-04-21 (×4): qty 1

## 2014-04-21 NOTE — H&P (Signed)
   I have seen and examined the patient. I agree with the above note with the addition of : Cardiac catheterization showed significant three-vessel coronary artery disease. Right heart catheterization showed mildly elevated filling pressures with moderate pulmonary hypertension. Left ventricular angiography was not performed due to chronic kidney disease with ejection fraction was 25-30% by echo. Resume gentle diuresis tomorrow. Start heparin drip 8 hours after sheath pull. I already left message to cardiothoracic surgery for a consult regarding CABG.  [name] MD, [facility] 04/21/2014 5:32 PM

## 2014-04-21 NOTE — Progress Notes (Signed)
ANTICOAGULATION CONSULT NOTE - Initial Consult  Pharmacy Consult for heparin Indication: post cath, ACS/STEMI  No Known Allergies  Patient Measurements: Height: 6' (182.9 cm) Weight: 289 lb 7.4 oz (131.3 kg) IBW/kg (Calculated) : 77.6 Heparin Dosing Weight: ~ 100 kg  Vital Signs: Temp: 97.8 F (36.6 C) (08/28 1727) Temp src: Oral (08/28 1727) BP: 133/5 mmHg (08/28 1727) Pulse Rate: 66 (08/28 1727)  Labs: No results found for this basename: HGB, HCT, PLT, APTT, LABPROT, INR, HEPARINUNFRC, CREATININE, CKTOTAL, CKMB, TROPONINI,  in the last 72 hours  CrCl is unknown because no creatinine reading has been taken.   Medical History: Past Medical History  Diagnosis Date  . Hypertension   . Hyperlipidemia   . Gout   . Diabetes mellitus without complication   . Nephrolithiasis   . Chest pain     a. 04/2013 Myoview:  EF 52%, small region of mild anterolateral and apical lateral ischemia - low to moderate risk scan.  [name] Umbilical hernia   . Epigastric hernia   . Obesity   . Shortness of breath     Medications:  Prescriptions prior to admission  Medication Sig Dispense Refill  . atenolol (TENORMIN) 25 MG tablet Take 25 mg by mouth daily.      . colchicine 0.6 MG tablet Take 0.6 mg by mouth daily.      [name] ibuprofen (ADVIL,MOTRIN) 800 MG tablet Take 800 mg by mouth every 8 (eight) hours as needed.      [name] omeprazole (PRILOSEC) 20 MG capsule Take 20 mg by mouth daily.      . phenazopyridine (PYRIDIUM) 200 MG tablet Take 200 mg by mouth 3 (three) times daily as needed for pain.      [name] acetaminophen (TYLENOL) 500 MG tablet Take 500 mg by mouth every 6 (six) hours as needed.      [name] amLODipine (NORVASC) 5 MG tablet       . aspirin 81 MG tablet Take 81 mg by mouth daily.      . cyanocobalamin 500 MCG tablet Take 500 mcg by mouth daily.      . ergocalciferol (VITAMIN D2) 50000 UNITS capsule Take 50,000 Units by mouth every 30 (thirty) days.      [name] gabapentin (NEURONTIN) 300 MG capsule  Take 300 mg by mouth 4 (four) times daily.       [name] lisinopril (PRINIVIL,ZESTRIL) 20 MG tablet Take 20 mg by mouth daily.       . metFORMIN (GLUCOPHAGE) 1000 MG tablet Take 1,000 mg by mouth 2 (two) times daily with a meal.      . Misc Natural Products (BLACK CHERRY CONCENTRATE PO) Take 750 mg by mouth.      . potassium citrate (UROCIT-K) 10 MEQ (1080 MG) SR tablet Take 20 mEq by mouth 2 (two) times daily.       . simethicone (MYLICON) 125 MG chewable tablet Chew 125 mg by mouth every 6 (six) hours as needed for flatulence.      . simvastatin (ZOCOR) 40 MG tablet Take 40 mg by mouth daily.         Assessment: 73 yo man transferred from [facility] s/p cardiac cath.  Pharmacy consulted to start heparin 8 hrs after sheath removed for ACS. Wt 131 kg.   Creat at [facility] 1.22;  H/H 15.5/49 plct 185 at [facility].  8/27 LMWH 130 mg given ag 0829 am and 2104 at [facility] 8/28 heparin 5000 bolus given at 1335 in cath lab. 8/28 Cath:  3V CAD with DM, LVdysfunction, EF 25-30% by echo.  PMH sign for CKD  Goal of Therapy:  Heparin level 0.3-0.7 units/ml Monitor platelets by anticoagulation protocol: Yes   Plan:  Start heparin at 1400 units/hr at 8pm tonight and check AM heparin level Daily HL and CBC while on heparin  Herby Abraham, Pharm.D. [phone] 04/21/2014 6:03 PM
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
## 2014-04-22 ENCOUNTER — Encounter (HOSPITAL_COMMUNITY): Payer: Self-pay | Admitting: *Deleted

## 2014-04-22 DIAGNOSIS — R0989 Other specified symptoms and signs involving the circulatory and respiratory systems: Secondary | ICD-10-CM

## 2014-04-22 DIAGNOSIS — I5021 Acute systolic (congestive) heart failure: Secondary | ICD-10-CM

## 2014-04-22 DIAGNOSIS — R0609 Other forms of dyspnea: Secondary | ICD-10-CM

## 2014-04-22 DIAGNOSIS — E785 Hyperlipidemia, unspecified: Secondary | ICD-10-CM

## 2014-04-22 DIAGNOSIS — E119 Type 2 diabetes mellitus without complications: Secondary | ICD-10-CM

## 2014-04-22 DIAGNOSIS — I2589 Other forms of chronic ischemic heart disease: Secondary | ICD-10-CM

## 2014-04-22 DIAGNOSIS — G4733 Obstructive sleep apnea (adult) (pediatric): Secondary | ICD-10-CM

## 2014-04-22 DIAGNOSIS — I214 Non-ST elevation (NSTEMI) myocardial infarction: Secondary | ICD-10-CM

## 2014-04-22 DIAGNOSIS — I2510 Atherosclerotic heart disease of native coronary artery without angina pectoris: Secondary | ICD-10-CM

## 2014-04-22 DIAGNOSIS — I10 Essential (primary) hypertension: Secondary | ICD-10-CM

## 2014-04-22 DIAGNOSIS — I209 Angina pectoris, unspecified: Secondary | ICD-10-CM

## 2014-04-22 DIAGNOSIS — I519 Heart disease, unspecified: Secondary | ICD-10-CM

## 2014-04-22 LAB — COMPREHENSIVE METABOLIC PANEL
ALT: 15 U/L (ref 0–53)
ANION GAP: 11 (ref 5–15)
AST: 22 U/L (ref 0–37)
Albumin: 3.3 g/dL — ABNORMAL LOW (ref 3.5–5.2)
Alkaline Phosphatase: 66 U/L (ref 39–117)
BILIRUBIN TOTAL: 0.7 mg/dL (ref 0.3–1.2)
BUN: 25 mg/dL — AB (ref 6–23)
CHLORIDE: 98 meq/L (ref 96–112)
CO2: 31 meq/L (ref 19–32)
CREATININE: 1.04 mg/dL (ref 0.50–1.35)
Calcium: 8.7 mg/dL (ref 8.4–10.5)
GFR calc Af Amer: 82 mL/min — ABNORMAL LOW (ref >90)
GFR, EST NON AFRICAN AMERICAN: 71 mL/min — AB (ref >90)
Glucose, Bld: 106 mg/dL — ABNORMAL HIGH (ref 70–99)
Potassium: 4.5 mEq/L (ref 3.7–5.3)
Sodium: 140 mEq/L (ref 137–147)
Total Protein: 6.4 g/dL (ref 6.0–8.3)

## 2014-04-22 LAB — GLUCOSE, CAPILLARY
GLUCOSE-CAPILLARY: 86 mg/dL (ref 70–99)
Glucose-Capillary: 51 mg/dL — ABNORMAL LOW (ref 70–99)
Glucose-Capillary: 116 mg/dL — ABNORMAL HIGH (ref 70–99)
Glucose-Capillary: 97 mg/dL (ref 70–99)

## 2014-04-22 LAB — CBC
HEMATOCRIT: 44.7 % (ref 39.0–52.0)
Hemoglobin: 14.1 g/dL (ref 13.0–17.0)
MCH: 28.5 pg (ref 26.0–34.0)
MCHC: 31.5 g/dL (ref 30.0–36.0)
MCV: 90.3 fL (ref 78.0–100.0)
Platelets: 181 10*3/uL (ref 150–400)
RBC: 4.95 MIL/uL (ref 4.22–5.81)
RDW: 13.3 % (ref 11.5–15.5)
WBC: 7.1 10*3/uL (ref 4.0–10.5)

## 2014-04-22 LAB — HEPARIN LEVEL (UNFRACTIONATED)
HEPARIN UNFRACTIONATED: 0.15 [IU]/mL — AB (ref 0.30–0.70)
Heparin Unfractionated: 0.19 IU/mL — ABNORMAL LOW (ref 0.30–0.70)
Heparin Unfractionated: 0.33 IU/mL (ref 0.30–0.70)

## 2014-04-22 MED ORDER — HEPARIN (PORCINE) IN NACL 100-0.45 UNIT/ML-% IJ SOLN
2000.0000 [IU]/h | INTRAMUSCULAR | Status: DC
  Administered 2014-04-22 – 2014-04-24 (×6): 2200 [IU]/h via INTRAVENOUS
  Administered 2014-04-25 (×2): 2000 [IU]/h via INTRAVENOUS
  Filled 2014-04-22 (×8): qty 250

## 2014-04-22 MED ORDER — FUROSEMIDE 40 MG PO TABS
40.0000 mg | ORAL_TABLET | Freq: Every day | ORAL | Status: DC
  Administered 2014-04-22 – 2014-04-25 (×4): 40 mg via ORAL
  Filled 2014-04-22 (×4): qty 1

## 2014-04-22 NOTE — Progress Notes (Signed)
ANTICOAGULATION CONSULT NOTE - Follow Up Consult  Pharmacy Consult:  Heparin Indication:  CAD, possible CABG  No Known Allergies  Patient Measurements: Height: 6' (182.9 cm) Weight: 295 lb 10.2 oz (134.1 kg) IBW/kg (Calculated) : 77.6 Heparin Dosing Weight: 100 kg  Vital Signs: Temp: 97.6 F (36.4 C) (08/29 1405) Temp src: Oral (08/29 1405) BP: 106/53 mmHg (08/29 1405) Pulse Rate: 66 (08/29 1405)  Labs:  Recent Labs  04/22/14 0435 04/22/14 1320  HEPARINUNFRC 0.15* 0.19*  CREATININE 1.04  --     Estimated Creatinine Clearance: 93.7 ml/min (by C-G formula based on Cr of 1.04).      Assessment: 72 YOM transferred from [place] s/p cath for CVTS eval.  CVTS recommended against cardiac surgery right now.  Heparin level sub-therapeutic; no issues with infusion per RN; no bleeding reported.   Goal of Therapy:  Heparin level 0.3-0.7 units/ml Monitor platelets by anticoagulation protocol: Yes    Plan:  - Increase heparin gtt to 2200 units/hr - Check 8 hr HL - Daily HL / CBC - F/U anticoagulation plan    Carmyn Hamm D. Laney Potash, PharmD, BCPS Pager:  [phone] 04/22/2014, 2:17 PM

## 2014-04-22 NOTE — Progress Notes (Addendum)
SUBJECTIVE: Pt says "I feel well today", specifically denying chest pain, shortness of breath, and leg swelling. Has not seen CT surgery yet. As per Dr. Jari Sportsman note, cath showed significant 3-v CAD (I am unable to locate actual cath report). Right heart cath showed mildly elevated filling pressures with moderate pulmonary HTN. EF 25-30% by echocardiogram.    Intake/Output Summary (Last 24 hours) at 04/22/14 0959 Last data filed at 04/22/14 0700  Gross per 24 hour  Intake    939 ml  Output   1201 ml  Net   -262 ml    Current Facility-Administered Medications  Medication Dose Route Frequency Provider Last Rate Last Dose  . 0.9 %  sodium chloride infusion  250 mL Intravenous PRN Ok Anis, NP      . acetaminophen (TYLENOL) tablet 650 mg  650 mg Oral Q4H PRN Ok Anis, NP      . amLODipine (NORVASC) tablet 5 mg  5 mg Oral Daily Ok Anis, NP   5 mg at 04/21/14 2040  . aspirin EC tablet 81 mg  81 mg Oral Daily Ok Anis, NP      . atorvastatin (LIPITOR) tablet 80 mg  80 mg Oral q1800 Ok Anis, NP   80 mg at 04/21/14 2045  . carvedilol (COREG) tablet 6.25 mg  6.25 mg Oral BID WC Ok Anis, NP   6.25 mg at 04/22/14 0543  . colchicine tablet 0.6 mg  0.6 mg Oral BID Ok Anis, NP   0.6 mg at 04/21/14 2333  . gabapentin (NEURONTIN) capsule 300 mg  300 mg Oral TID Ok Anis, NP   300 mg at 04/21/14 2045  . heparin ADULT infusion 100 units/mL (25000 units/250 mL)  1,800 Units/hr Intravenous Continuous Colleen Can, RPH 18 mL/hr at 04/22/14 0544 1,800 Units/hr at 04/22/14 0544  . insulin aspart (novoLOG) injection 0-15 Units  0-15 Units Subcutaneous TID WC Ok Anis, NP      . lisinopril (PRINIVIL,ZESTRIL) tablet 20 mg  20 mg Oral Daily Ok Anis, NP   20 mg at 04/21/14 2045  . nitroGLYCERIN (NITROSTAT) SL tablet 0.4 mg  0.4 mg Sublingual Q5 Min x 3 PRN Ok Anis, NP        . ondansetron [facility]) injection 4 mg  4 mg Intravenous Q6H PRN Ok Anis, NP      . pantoprazole (PROTONIX) EC tablet 40 mg  40 mg Oral Q0600 Ok Anis, NP   40 mg at 04/22/14 0543  . sodium chloride 0.9 % injection 3 mL  3 mL Intravenous Q12H Ok Anis, NP   3 mL at 04/21/14 2200  . sodium chloride 0.9 % injection 3 mL  3 mL Intravenous PRN Ok Anis, NP        Filed Vitals:   04/21/14 1727 04/21/14 2040 04/21/14 2044 04/22/14 0507  BP: 133/5 122/68 122/68 132/64  Pulse: 66 73 73 61  Temp: 97.8 F (36.6 C) 98.2 F (36.8 C)  97.8 F (36.6 C)  TempSrc: Oral Oral  Oral  Resp: Height: 6' (1.829 m)     Weight: 289 lb 7.4 oz (131.3 kg)   295 lb 10.2 oz (134.1 kg)  SpO2: 97% 97%  96%    PHYSICAL EXAM General: NAD, morbidly obese HEENT: Normal. Neck: JVP difficult to assess given habitus. Lungs: Clear to auscultation bilaterally with normal respiratory effort.  CV: Distant heart sounds. Regular rate and rhythm, normal S1/S2, no S3/S4, no murmur.  No pretibial edema.   Abdomen: Soft, nontender, obse. Neurologic: Alert and oriented x 3.  Psych: Normal affect. Musculoskeletal: Normal range of motion. No gross deformities. Extremities: No clubbing or cyanosis.     LABS: Basic Metabolic Panel:  Recent Labs  61/60/73 0435  NA 140  K 4.5  CL 98  CO2 31  GLUCOSE 106*  BUN 25*  CREATININE 1.04  CALCIUM 8.7   Liver Function Tests:  Recent Labs  04/22/14 0435  AST 22  ALT 15  ALKPHOS 66  BILITOT 0.7  PROT 6.4  ALBUMIN 3.3*   No results found for this basename: LIPASE, AMYLASE,  in the last 72 hours CBC: No results found for this basename: WBC, NEUTROABS, HGB, HCT, MCV, PLT,  in the last 72 hours Cardiac Enzymes: No results found for this basename: CKTOTAL, CKMB, CKMBINDEX, TROPONINI,  in the last 72 hours BNP: No components found with this basename: POCBNP,  D-Dimer: No results found for this basename: DDIMER,   in the last 72 hours Hemoglobin A1C: No results found for this basename: HGBA1C,  in the last 72 hours Fasting Lipid Panel: No results found for this basename: CHOL, HDL, LDLCALC, TRIG, CHOLHDL, LDLDIRECT,  in the last 72 hours Thyroid Function Tests: No results found for this basename: TSH, T4TOTAL, FREET3, T3FREE, THYROIDAB,  in the last 72 hours Anemia Panel: No results found for this basename: VITAMINB12, FOLATE, FERRITIN, TIBC, IRON, RETICCTPCT,  in the last 72 hours  RADIOLOGY: No results found.    ASSESSMENT AND PLAN: 1. NSTEMI/severe 3-vessel CAD:  1 month h/o progressive DOE, increasing abd girth, and lower extremity edema with acute dyspnea, diaphoresis, and marked hypertension at [place].  CXR showed CHF and troponin rose to 0.81.  --Cath on 8/28 reportedly showed severe 3 vessel disease. Continue heparin. --Severely reduced EF on ECHO 25 to 30%, possible wall motion abnormality..  --Cont asa, Coreg, lisinopril, and Lipitor.  --Await evaluation for CABG by CT surgery. -Will check CBC.  2. Acute Systolic CHF:  In setting of hypertensive emergency and NSTEMI. BP controlled today. Creat bumped and Lasix held on 8/28. I will resume today. Cont Coreg and lisinopril. Follow.   3. Hypertensive Emergency:  In setting of above. No BP well controlled on Coreg, lisinopril, and amlodipine.  4. Hyperlipidemia:  In setting of ACS, cont lipitor 80.   5. Morbid Obesity:  Will benefit from cardiac rehab and outpt sleep eval.   6. DM:  Per IM.     Prentice Docker, M.D., F.A.C.C.

## 2014-04-22 NOTE — Progress Notes (Signed)
ANTICOAGULATION CONSULT NOTE - Follow Up Consult  Pharmacy Consult for heparin Indication: CAD  Labs:  Recent Labs  04/22/14 0435 04/22/14 1320 04/22/14 2220  HGB  --  14.1  --   HCT  --  44.7  --   PLT  --  181  --   HEPARINUNFRC 0.15* 0.19* 0.33  CREATININE 1.04  --   --     Assessment/Plan:  72yo male therapeutic on heparin after rate increases. Will continue gtt at current rate and confirm stable with am labs.   Vernard Gambles, PharmD, BCPS  04/22/2014,11:12 PM

## 2014-04-22 NOTE — Progress Notes (Signed)
ANTICOAGULATION CONSULT NOTE - Follow Up Consult  Pharmacy Consult for heparin Indication: CAD awaiting TCTS eval  Labs:  Recent Labs  04/22/14 0435  HEPARINUNFRC 0.15*    Assessment: 72yo male subtherapeutic on heparin with initial dosing post-cath.  Goal of Therapy:  Heparin level 0.3-0.7 units/ml   Plan:  Will increase heparin gtt by 3 units/kg/hr to 1800 units/hr and check level in 8hr.  Vernard Gambles, PharmD, BCPS  04/22/2014,5:33 AM

## 2014-04-22 NOTE — Consult Note (Addendum)
[address]       [place] [address]             [phone]        [name] [name] [facility] Medical Record #[id number] Date of Birth: [birth year]  Referring: No ref. provider found Primary Care: [name], MD  Chief Complaint: Orthopnea, shortness of breath, abdominal and leg edema  History of Present Illness:  Patient examined, coronary angiogram and 2-D echocardiogram from [facility] reviewed      73 year old morbidly obese (300 lbs) diabetic male was admitted to [facility] 2 days ago with acute respiratory insufficiency and hypoxemia. In the preceding several days he noticed increase in weight, significant leg swelling and abdominal swelling, orthopnea and PND. He required BiPAP by EMT to maintain O2 sats greater than 80%. He was admitted to the hospital and found to have edema on chest x-ray with mildly positive cardiac enzymes. He was stabilized and underwent Echocardiogram which was difficult to interpret because of poor images but he was felt to have EF of 25%. Coronary angiogram showed normal dominant right coronary, moderate stenosis of the proximal LAD, and high-grade stenosis of the OM 1 OM 2 branches of the circumflex which were small vessels. He underwent coronary angiography via the right radial artery. Ventriculogram was not performed due to the patient's renal insufficiency.  Right heart catheterization was performed with PA pressures elevated at 60/40, wedge 18, LVEDP 24, RV pressure 58/19 and cardiac index 2.4  In 2014 the patient had chest pain. A myocardial perfusion scan demonstrated EF of 52%. There was some anterior wall hypoperfusion but it was felt the patient did not need cardiac catheterization despite having all the risk factors for CAD.  Current Activity/ Functional Status: The patient is retired from working in a Producer, television/film/video with sheet rock Patient stopped smoking a few years ago. He has a heavy drinking history over  10 years ago. The patient lives alone. Is not married. He does not have children.   Zubrod Score: At the time of surgery this patient's most appropriate activity status/level should be described as:     0    Normal activity, no symptoms     1    Restricted in physical strenuous activity but ambulatory, able to do out light work     2    Ambulatory and capable of self care, unable to do work activities, up and about                 more than 50%  Of the time                                3    Only limited self care, in bed greater than 50% of waking hours     4    Completely disabled, no self care, confined to bed or chair     5    Moribund  Past Medical History  Diagnosis Date  . Hypertension   . Hyperlipidemia   . Gout   . Diabetes mellitus without complication   . Nephrolithiasis   . Chest pain     a. 04/2013 Myoview:  EF 52%, small region of mild anterolateral and apical lateral ischemia - low to moderate risk scan.  [name] Umbilical hernia   . Epigastric hernia   . Obesity   . Shortness of breath  Past Surgical History  Procedure Laterality Date  . Rotator cuff repair Right 1996,1997,2000  . Carpal tunnel release Right     History  Smoking status  . Former Smoker -- 1.00 packs/day for 25 years  . Types: Cigarettes  Smokeless tobacco  . Never Used    History  Alcohol Use No    History   Social History  . Marital Status: Single    Spouse Name: N/A    Number of Children: N/A  . Years of Education: N/A   Occupational History  . Not on file.   Social History Main Topics  . Smoking status: Former Smoker -- 1.00 packs/day for 25 years    Types: Cigarettes  . Smokeless tobacco: Never Used  . Alcohol Use: No  . Drug Use: No  . Sexual Activity: Not on file   Other Topics Concern  . Not on file   Social History Narrative  . No narrative on file    No Known Allergies  Current Facility-Administered Medications  Medication Dose Route Frequency  Provider Last Rate Last Dose  . 0.9 %  sodium chloride infusion  250 mL Intravenous PRN Ok Anis, NP      . acetaminophen (TYLENOL) tablet 650 mg  650 mg Oral Q4H PRN Ok Anis, NP      . amLODipine (NORVASC) tablet 5 mg  5 mg Oral Daily Ok Anis, NP   5 mg at 04/22/14 1106  . aspirin EC tablet 81 mg  81 mg Oral Daily Ok Anis, NP   81 mg at 04/22/14 1106  . atorvastatin (LIPITOR) tablet 80 mg  80 mg Oral q1800 Ok Anis, NP   80 mg at 04/21/14 2045  . carvedilol (COREG) tablet 6.25 mg  6.25 mg Oral BID WC Ok Anis, NP   6.25 mg at 04/22/14 0543  . colchicine tablet 0.6 mg  0.6 mg Oral BID Ok Anis, NP   0.6 mg at 04/22/14 1106  . furosemide (LASIX) tablet 40 mg  40 mg Oral Daily Laqueta Linden, MD      . gabapentin (NEURONTIN) capsule 300 mg  300 mg Oral TID Ok Anis, NP   300 mg at 04/22/14 1106  . heparin ADULT infusion 100 units/mL (25000 units/250 mL)  1,800 Units/hr Intravenous Continuous Colleen Can, RPH 18 mL/hr at 04/22/14 1107 1,800 Units/hr at 04/22/14 1107  . insulin aspart (novoLOG) injection 0-15 Units  0-15 Units Subcutaneous TID WC Ok Anis, NP      . lisinopril (PRINIVIL,ZESTRIL) tablet 20 mg  20 mg Oral Daily Ok Anis, NP   20 mg at 04/22/14 1105  . nitroGLYCERIN (NITROSTAT) SL tablet 0.4 mg  0.4 mg Sublingual Q5 Min x 3 PRN Ok Anis, NP      . ondansetron [facility]) injection 4 mg  4 mg Intravenous Q6H PRN Ok Anis, NP      . pantoprazole (PROTONIX) EC tablet 40 mg  40 mg Oral Q0600 Ok Anis, NP   40 mg at 04/22/14 0543  . sodium chloride 0.9 % injection 3 mL  3 mL Intravenous Q12H Ok Anis, NP   3 mL at 04/22/14 1107  . sodium chloride 0.9 % injection 3 mL  3 mL Intravenous PRN Ok Anis, NP        Prescriptions prior to admission  Medication Sig Dispense Refill  . amLODipine (NORVASC) 5 MG tablet Take 5  mg  by mouth daily.       [name] aspirin 81 MG tablet Take 81 mg by mouth every morning.       [name] atenolol (TENORMIN) 25 MG tablet Take 25 mg by mouth at bedtime.       . colchicine 0.6 MG tablet Take 0.6 mg by mouth daily as needed (for gout flareup).       . cyanocobalamin 500 MCG tablet Take 500 mcg by mouth daily.      . ergocalciferol (VITAMIN D2) 50000 UNITS capsule Take 50,000 Units by mouth every 30 (thirty) days.      [name] gabapentin (NEURONTIN) 300 MG capsule Take 300 mg by mouth 4 (four) times daily.       [name] ibuprofen (ADVIL,MOTRIN) 800 MG tablet Take 800 mg by mouth every 8 (eight) hours as needed for moderate pain.       . Ibuprofen-Diphenhydramine HCl (ADVIL PM) 200-25 MG CAPS Take 1 tablet by mouth at bedtime as needed (for sleep).       [name] lisinopril (PRINIVIL,ZESTRIL) 20 MG tablet Take 20 mg by mouth every morning.       . metFORMIN (GLUCOPHAGE) 1000 MG tablet Take 1,000 mg by mouth 2 (two) times daily with a meal.      . Misc Natural Products (BLACK CHERRY CONCENTRATE PO) Take 1 capsule by mouth daily.      [name] omeprazole (PRILOSEC) 20 MG capsule Take 20 mg by mouth every morning.       . simvastatin (ZOCOR) 40 MG tablet Take 40 mg by mouth every morning.         Family History  Problem Relation Age of Onset  . Colon polyps Brother   . Lung cancer Brother   . Throat cancer Father      Review of Systems:     Cardiac Review of Systems: Y or N  Chest Pain [  no  ]  Resting SOB Mahler.Beck   ] Exertional SOB  [ yes ]  Pollyann Kennedy Mahler.Beck  ]   Pedal Edema [  yes ]    Palpitations no [  ] Syncope  [no  ]   Presyncope [ no  ]  General Review of Systems: [Y] = yes [  ]=no Constitional: recent weight change [yes increasing-300 pounds  ]; anorexia [  ]; fatigue [  ]; nausea [  ]; night sweats [  ]; fever [  ]; or chills [  ]                                                               Dental: poor dentition[  ]; Last Dentist visit: 3 months ago for dental extraction, no active complaints  Eye :  blurred vision [  ]; diplopia [   ]; vision changes [  ];  Amaurosis fugax[  ]; Resp: cough [  ];  wheezing[  ];  hemoptysis[  ]; shortness of breath[  yes]; paroxysmal nocturnal dyspnea[yes  ]; dyspnea on exertion[yes  ]; or orthopnea[yes  ];  GI:  gallstones[  ], vomiting[  ];  dysphagia[  ]; melena[  ];  hematochezia [  ]; heartburn[  ];   Hx of  Colonoscopy[  ]; GU: kidney stones [ yes past history ]; hematuria[  ];  dysuria [  ];  nocturia[  ];  history of     obstruction [  ]; urinary frequency [  ]             Skin: rash, swelling[  ];, hair loss[  ];  peripheral edema[  ];  or itching[  ]; Musculosketetal: myalgias[  ];  joint swelling[  yes history of gout];  joint erythema[  ];  joint pain[  ];  back pain[  ];  Heme/Lymph: bruising[  ];  bleeding[  ];  anemia[  ];  Neuro: TIA[  ];  headaches[  ];  stroke[  ];  vertigo[  ];  seizures[  ];   paresthesias[  ];  difficulty walking[yes  ];  Psych:depression[  ]; anxiety[  ];  Endocrine: diabetes[yes type II  ];  thyroid dysfunction[  ];  Immunizations: Flu [  ]; Pneumococcal[  ];  Other:  Physical Exam: BP 132/64  Pulse 61  Temp(Src) 97.8 F (36.6 C) (Oral)  Resp 16  Ht 6' (1.829 m)  Wt 295 lb 10.2 oz (134.1 kg)  BMI 40.09 kg/m2  SpO2 96%  Exam General appearance-morbid obese Caucasian male sitting up in a recliner chair on oxygen short of breath HEENT-normocephalic pupils equal dentition adequate Neck-moderate JVD, no adenopathy or carotid bruit Thorax-by basilar rales Cardiac-regular rhythm, no murmur detected, distant breath sounds Abdomen-morbidly obese no tenderness, diastases recti  Extremities-2-3+ pedal edema, no evidence of venous insufficiency Neuro-no focal motor deficit  Diagnostic Studies & Laboratory data:     Recent Radiology Findings:   No results found. chest CT scan pending    Recent Lab Findings: Lab Results  Component Value Date   GLUCOSE 106* 04/22/2014   ALT 15 04/22/2014   AST 22 04/22/2014    NA 140 04/22/2014   K 4.5 04/22/2014   CL 98 04/22/2014   CREATININE 1.04 04/22/2014   BUN 25* 04/22/2014   CO2 31 04/22/2014      Assessment / Plan:      73 year old obese diabetic hypertensive male with chronic renal insufficiency, chronic lung disease, and recent onset of heart failure symptoms which culminated in acute respiratory distress and requirement for BiPAP. 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 Recommend heart failure consultation, transesophageal echo to properly evaluate the mitral valve and biventricular function  The circumflex vessels are small and would be difficult targets due to his obesity. LAD disease is moderate, RCA disease is mild to moderate.  We'll follow      @ME1 @ 04/22/2014 12:01 PM

## 2014-04-22 NOTE — Progress Notes (Signed)
CARDIAC REHAB PHASE I   PRE:  Rate/Rhythm: 66 SR  BP:  Supine:   Sitting: 110/62  Standing:    SaO2: 85% 2L  MODE:  Ambulation: 500 ft   POST:  Rate/Rhythm: 77  BP:  Supine:   Sitting: 120/60  Standing:    SaO2: 94% 2L  1426-1450 Pt tolerated ambulation well with assist x2 and pushing rolling walker.  Gait steady, no c/o, VSS. To chair after walk with legs elevated, IV intact. Reviewed checking daily weights and when to call physician. Can be a x1.   Artist Pais, MS, ACSM CCEP

## 2014-04-23 LAB — BASIC METABOLIC PANEL
ANION GAP: 11 (ref 5–15)
BUN: 21 mg/dL (ref 6–23)
CALCIUM: 8.7 mg/dL (ref 8.4–10.5)
CO2: 29 mEq/L (ref 19–32)
CREATININE: 0.99 mg/dL (ref 0.50–1.35)
Chloride: 99 mEq/L (ref 96–112)
GFR, EST NON AFRICAN AMERICAN: 81 mL/min — AB (ref >90)
Glucose, Bld: 129 mg/dL — ABNORMAL HIGH (ref 70–99)
Potassium: 4.1 mEq/L (ref 3.7–5.3)
Sodium: 139 mEq/L (ref 137–147)

## 2014-04-23 LAB — BLOOD GAS, ARTERIAL
Acid-Base Excess: 6.6 mmol/L — ABNORMAL HIGH (ref 0.0–2.0)
Bicarbonate: 31.7 mEq/L — ABNORMAL HIGH (ref 20.0–24.0)
Drawn by: 12971
O2 Content: 2 L/min
O2 Saturation: 94.4 %
Patient temperature: 98.6
TCO2: 33.4 mmol/L (ref 0–100)
pCO2 arterial: 55.6 mmHg — ABNORMAL HIGH (ref 35.0–45.0)
pH, Arterial: 7.374 (ref 7.350–7.450)
pO2, Arterial: 74.1 mmHg — ABNORMAL LOW (ref 80.0–100.0)

## 2014-04-23 LAB — CBC
HCT: 46.3 % (ref 39.0–52.0)
HEMOGLOBIN: 14.6 g/dL (ref 13.0–17.0)
MCH: 29 pg (ref 26.0–34.0)
MCHC: 31.5 g/dL (ref 30.0–36.0)
MCV: 91.9 fL (ref 78.0–100.0)
PLATELETS: 176 10*3/uL (ref 150–400)
RBC: 5.04 MIL/uL (ref 4.22–5.81)
RDW: 13.4 % (ref 11.5–15.5)
WBC: 7.1 10*3/uL (ref 4.0–10.5)

## 2014-04-23 LAB — URINALYSIS, ROUTINE W REFLEX MICROSCOPIC
Bilirubin Urine: NEGATIVE
Glucose, UA: NEGATIVE mg/dL
Ketones, ur: NEGATIVE mg/dL
Leukocytes, UA: NEGATIVE
Nitrite: NEGATIVE
Protein, ur: NEGATIVE mg/dL
Specific Gravity, Urine: 1.011 (ref 1.005–1.030)
Urobilinogen, UA: 0.2 mg/dL (ref 0.0–1.0)
pH: 5 (ref 5.0–8.0)

## 2014-04-23 LAB — GLUCOSE, CAPILLARY
GLUCOSE-CAPILLARY: 106 mg/dL — AB (ref 70–99)
GLUCOSE-CAPILLARY: 132 mg/dL — AB (ref 70–99)
Glucose-Capillary: 104 mg/dL — ABNORMAL HIGH (ref 70–99)
Glucose-Capillary: 102 mg/dL — ABNORMAL HIGH (ref 70–99)

## 2014-04-23 LAB — URINE MICROSCOPIC-ADD ON

## 2014-04-23 LAB — HEPARIN LEVEL (UNFRACTIONATED): Heparin Unfractionated: 0.48 IU/mL (ref 0.30–0.70)

## 2014-04-23 NOTE — Progress Notes (Signed)
SUBJECTIVE: Pt slept in chair last night. Denies chest pain and shortness of breath. Had feet swelling which has gone down. Denies PND.     Intake/Output Summary (Last 24 hours) at 04/23/14 0919 Last data filed at 04/23/14 0545  Gross per 24 hour  Intake    180 ml  Output   1950 ml  Net  -1770 ml    Current Facility-Administered Medications  Medication Dose Route Frequency Provider Last Rate Last Dose  . 0.9 %  sodium chloride infusion  250 mL Intravenous PRN Ok Anis, NP      . acetaminophen (TYLENOL) tablet 650 mg  650 mg Oral Q4H PRN Ok Anis, NP      . amLODipine (NORVASC) tablet 5 mg  5 mg Oral Daily Ok Anis, NP   5 mg at 04/22/14 1106  . aspirin EC tablet 81 mg  81 mg Oral Daily Ok Anis, NP   81 mg at 04/22/14 1106  . atorvastatin (LIPITOR) tablet 80 mg  80 mg Oral q1800 Ok Anis, NP   80 mg at 04/22/14 1700  . carvedilol (COREG) tablet 6.25 mg  6.25 mg Oral BID WC Ok Anis, NP   6.25 mg at 04/23/14 0630  . colchicine tablet 0.6 mg  0.6 mg Oral BID Ok Anis, NP   0.6 mg at 04/22/14 2128  . furosemide (LASIX) tablet 40 mg  40 mg Oral Daily Laqueta Linden, MD   40 mg at 04/22/14 1304  . gabapentin (NEURONTIN) capsule 300 mg  300 mg Oral TID Ok Anis, NP   300 mg at 04/22/14 2128  . heparin ADULT infusion 100 units/mL (25000 units/250 mL)  2,200 Units/hr Intravenous Continuous Lennon Alstrom, RPH 22 mL/hr at 04/22/14 2352 2,200 Units/hr at 04/22/14 2352  . insulin aspart (novoLOG) injection 0-15 Units  0-15 Units Subcutaneous TID WC Ok Anis, NP      . lisinopril (PRINIVIL,ZESTRIL) tablet 20 mg  20 mg Oral Daily Ok Anis, NP   20 mg at 04/22/14 1105  . nitroGLYCERIN (NITROSTAT) SL tablet 0.4 mg  0.4 mg Sublingual Q5 Min x 3 PRN Ok Anis, NP      . ondansetron [facility]) injection 4 mg  4 mg Intravenous Q6H PRN Ok Anis, NP      .  pantoprazole (PROTONIX) EC tablet 40 mg  40 mg Oral Q0600 Ok Anis, NP   40 mg at 04/23/14 0543  . sodium chloride 0.9 % injection 3 mL  3 mL Intravenous Q12H Ok Anis, NP   3 mL at 04/22/14 2129  . sodium chloride 0.9 % injection 3 mL  3 mL Intravenous PRN Ok Anis, NP        Filed Vitals:   04/22/14 1405 04/22/14 2121 04/23/14 0540 04/23/14 0639  BP: 106/53 129/66 130/75   Pulse: 66 60 58   Temp: 97.6 F (36.4 C) 97.6 F (36.4 C) 97.8 F (36.6 C)   TempSrc: Oral Oral Oral   Resp: 18 20 19    Height:      Weight:    291 lb 14.2 oz (132.4 kg)  SpO2: 96% 97%      PHYSICAL EXAM General: NAD, morbidly obese  HEENT: Normal.  Neck: JVP difficult to assess given habitus.  Lungs: Diminished sounds at bases but otherwise clear bilaterally with normal respiratory effort.  CV: Distant heart sounds. Regular rate and rhythm,  normal S1/S2, no S3/S4, no murmur. No pretibial edema.  Abdomen: Soft, nontender, obse.  Neurologic: Alert and oriented x 3.  Psych: Normal affect.  Musculoskeletal: Normal range of motion. No gross deformities.  Extremities: No clubbing or cyanosis. Mild dorsal pedal edema b/l.  TELEMETRY: Reviewed telemetry pt in sinus rhythm with occasional PVC's.  LABS: Basic Metabolic Panel:  Recent Labs  04/22/14 0435  NA 140  K 4.5  CL 98  CO2 31  GLUCOSE 106*  BUN 25*  CREATININE 1.04  CALCIUM 8.7   Liver Function Tests:  Recent Labs  04/22/14 0435  AST 22  ALT 15  ALKPHOS 66  BILITOT 0.7  PROT 6.4  ALBUMIN 3.3*   No results found for this basename: LIPASE, AMYLASE,  in the last 72 hours CBC:  Recent Labs  04/22/14 1320 04/23/14 0550  WBC 7.1 7.1  HGB 14.1 14.6  HCT 44.7 46.3  MCV 90.3 91.9  PLT 181 176   Cardiac Enzymes: No results found for this basename: CKTOTAL, CKMB, CKMBINDEX, TROPONINI,  in the last 72 hours BNP: No components found with this basename: POCBNP,  D-Dimer: No results found for this  basename: DDIMER,  in the last 72 hours Hemoglobin A1C: No results found for this basename: HGBA1C,  in the last 72 hours Fasting Lipid Panel: No results found for this basename: CHOL, HDL, LDLCALC, TRIG, CHOLHDL, LDLDIRECT,  in the last 72 hours Thyroid Function Tests: No results found for this basename: TSH, T4TOTAL, FREET3, T3FREE, THYROIDAB,  in the last 72 hours Anemia Panel: No results found for this basename: VITAMINB12, FOLATE, FERRITIN, TIBC, IRON, RETICCTPCT,  in the last 72 hours  RADIOLOGY: No results found.    ASSESSMENT AND PLAN: 1. NSTEMI/severe 3-vessel CAD:  One month h/o progressive DOE, increasing abd girth, and lower extremity edema with acute dyspnea, diaphoresis, and marked hypertension at [place].  CXR showed CHF and troponin rose to 0.81.  --Cath on 8/28 reportedly showed severe 3 vessel disease. Will plan to d/c heparin on 8/31 am.  --Severely reduced EF on ECHO 25 to 30%, possible wall motion abnormality. Given poor visualization, as per CT surg, will plan to proceed with TEE on 8/31. --Cont asa, Coreg, lisinopril, and Lipitor.  --CT surgery evaluated pt on 8/29, and not deemed to be a suitable candidate for surgery at the present time. Needs optimization of clinical status.  2. Acute Systolic CHF:  In setting of hypertensive emergency and NSTEMI. BP remains controlled today.  Creat bumped and Lasix held on 8/28. I resumed on 8/29, and pt has had 2.3 liters of output in past 48 hrs. Will check BMET.  Cont Coreg and lisinopril. Follow.   3. Hypertensive Emergency:  In setting of above. BP now well controlled on Coreg, lisinopril, and amlodipine.   4. Hyperlipidemia:  In setting of ACS, cont lipitor 80.   5. Morbid Obesity:  Will benefit from cardiac rehab and outpt sleep eval.   6. DM:  Currently on insulin, metformin at home.   Panagiotis Oelkers, M.D., F.A.C.C.  

## 2014-04-23 NOTE — Progress Notes (Signed)
UR Completed.  [name] [name] Jane [phone] 04/23/2014  

## 2014-04-23 NOTE — Progress Notes (Signed)
ANTICOAGULATION CONSULT NOTE - Follow Up Consult  Pharmacy Consult for heparin Indication: CAD  Labs:  Recent Labs  04/22/14 0435 04/22/14 1320 04/22/14 2220 04/23/14 0550 04/23/14 0940  HGB  --  14.1  --  14.6  --   HCT  --  44.7  --  46.3  --   PLT  --  181  --  176  --   HEPARINUNFRC 0.15* 0.19* 0.33 0.48  --   CREATININE 1.04  --   --   --  0.99    Assessment/Plan:  73yo male admitted 04/21/2014 with CP s/p cath finding 3v CAD, s/p cardiac surgery eval but not a candidate. Pharmacy consulted to continue heparin through 8/31 am  PMH: HF EF 25-30% by echo, CAD, COPD,   Coag: ACS, heparin at goal at 2200/h, CBC wnl, no bleeding noted  Cardiovascular: HTN / HLD / CHF (EF 25-30%) - cath showed multivessel CAD, CVTS rec TEE to eval valve fxn. SBP 130-100, HR 50-60 Norvasc, ASA, Lipitor, Coreg, Lasix, lisinopril.   PTA Medication Issues: cyanocobalamin, Vit D, metformin  Best Practices: heparin gtt  Goal of Therapy:  Heparin level 0.3-0.7 units/ml Monitor platelets by anticoagulation protocol: Yes  Plan:  -Continue heparin @ 2200 units/hr - Daily HL / CBC - F/U DC order on 8/31 am  Thank you for allowing pharmacy to be a part of this patients care team.  Lovenia Kim Pharm.D., BCPS, AQ-Cardiology Clinical Pharmacist 04/23/2014 12:40 PM Pager: [phone] Phone: [phone]

## 2014-04-24 ENCOUNTER — Other Ambulatory Visit: Payer: Self-pay | Admitting: *Deleted

## 2014-04-24 ENCOUNTER — Encounter (HOSPITAL_COMMUNITY): Payer: Self-pay | Admitting: *Deleted

## 2014-04-24 ENCOUNTER — Encounter (HOSPITAL_COMMUNITY)
Admission: AD | Disposition: A | Discharge status: Home / Self Care | Payer: Self-pay | Source: Other Acute Inpatient Hospital | Attending: Internal Medicine

## 2014-04-24 ENCOUNTER — Inpatient Hospital Stay (HOSPITAL_COMMUNITY): Payer: Medicare PPO

## 2014-04-24 DIAGNOSIS — I5021 Acute systolic (congestive) heart failure: Secondary | ICD-10-CM | POA: Diagnosis present

## 2014-04-24 DIAGNOSIS — E785 Hyperlipidemia, unspecified: Secondary | ICD-10-CM | POA: Diagnosis present

## 2014-04-24 DIAGNOSIS — Z01810 Encounter for preprocedural cardiovascular examination: Secondary | ICD-10-CM

## 2014-04-24 DIAGNOSIS — I509 Heart failure, unspecified: Secondary | ICD-10-CM

## 2014-04-24 DIAGNOSIS — I10 Essential (primary) hypertension: Secondary | ICD-10-CM | POA: Diagnosis present

## 2014-04-24 DIAGNOSIS — I059 Rheumatic mitral valve disease, unspecified: Secondary | ICD-10-CM

## 2014-04-24 DIAGNOSIS — I2510 Atherosclerotic heart disease of native coronary artery without angina pectoris: Secondary | ICD-10-CM

## 2014-04-24 DIAGNOSIS — E119 Type 2 diabetes mellitus without complications: Secondary | ICD-10-CM | POA: Diagnosis present

## 2014-04-24 HISTORY — PX: TEE WITHOUT CARDIOVERSION: SHX5443

## 2014-04-24 LAB — BASIC METABOLIC PANEL
Anion gap: 10 (ref 5–15)
BUN: 20 mg/dL (ref 6–23)
CO2: 31 meq/L (ref 19–32)
CREATININE: 1.14 mg/dL (ref 0.50–1.35)
Calcium: 9.1 mg/dL (ref 8.4–10.5)
Chloride: 100 mEq/L (ref 96–112)
GFR calc Af Amer: 73 mL/min — ABNORMAL LOW (ref >90)
GFR calc non Af Amer: 63 mL/min — ABNORMAL LOW (ref >90)
GLUCOSE: 106 mg/dL — AB (ref 70–99)
Potassium: 3.9 mEq/L (ref 3.7–5.3)
Sodium: 141 mEq/L (ref 137–147)

## 2014-04-24 LAB — GLUCOSE, CAPILLARY
GLUCOSE-CAPILLARY: 105 mg/dL — AB (ref 70–99)
Glucose-Capillary: 120 mg/dL — ABNORMAL HIGH (ref 70–99)
Glucose-Capillary: 153 mg/dL — ABNORMAL HIGH (ref 70–99)

## 2014-04-24 LAB — CBC
HCT: 45 % (ref 39.0–52.0)
Hemoglobin: 14.3 g/dL (ref 13.0–17.0)
MCH: 29.1 pg (ref 26.0–34.0)
MCHC: 31.8 g/dL (ref 30.0–36.0)
MCV: 91.5 fL (ref 78.0–100.0)
Platelets: 180 K/uL (ref 150–400)
RBC: 4.92 MIL/uL (ref 4.22–5.81)
RDW: 13.2 % (ref 11.5–15.5)
WBC: 7.1 K/uL (ref 4.0–10.5)

## 2014-04-24 LAB — LIPID PANEL
Cholesterol: 88 mg/dL (ref 0–200)
HDL: 23 mg/dL — ABNORMAL LOW (ref >39)
LDL Cholesterol: 42 mg/dL (ref 0–99)
Total CHOL/HDL Ratio: 3.8 RATIO
Triglycerides: 117 mg/dL (ref <150)
VLDL: 23 mg/dL (ref 0–40)

## 2014-04-24 LAB — SURGICAL PCR SCREEN
MRSA, PCR: NEGATIVE
Staphylococcus aureus: NEGATIVE

## 2014-04-24 LAB — HEMOGLOBIN A1C
Hgb A1c MFr Bld: 6.6 % — ABNORMAL HIGH (ref <5.7)
Mean Plasma Glucose: 143 mg/dL — ABNORMAL HIGH (ref <117)

## 2014-04-24 LAB — HEPARIN LEVEL (UNFRACTIONATED): Heparin Unfractionated: 0.3 IU/mL (ref 0.30–0.70)

## 2014-04-24 IMAGING — CT CT CHEST W/ CM
2 of 3 series · 15 of 36 positions shown, 18 images · IV contrast (omnipaque)
Comparison: None.

CLINICAL DATA: Recurrent chest pain and shortness of breath.

EXAM:
CT CHEST WITH CONTRAST
TECHNIQUE: Multidetector CT imaging of the chest was performed during
intravenous contrast administration.
CONTRAST:  75mL OMNIPAQUE IOHEXOL 300 MG/ML  SOLN

[Series 2: thorax 5.0 i31f 1 · axial · 0.88mm/px · z∈[+1179,+1474]mm · 12 of 71 slices shown, 15 images]
[im 6/71  mediastinal]
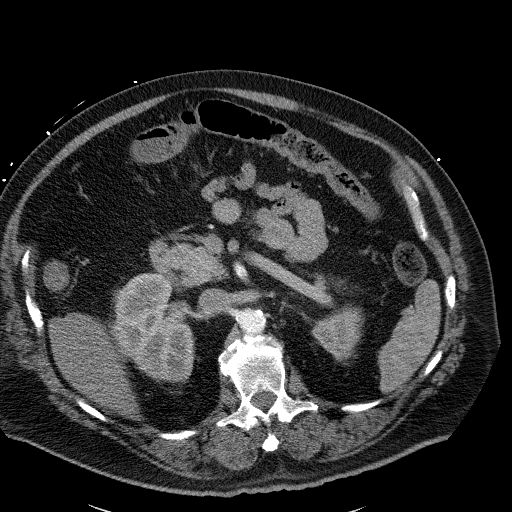
[im 6/71  lung]
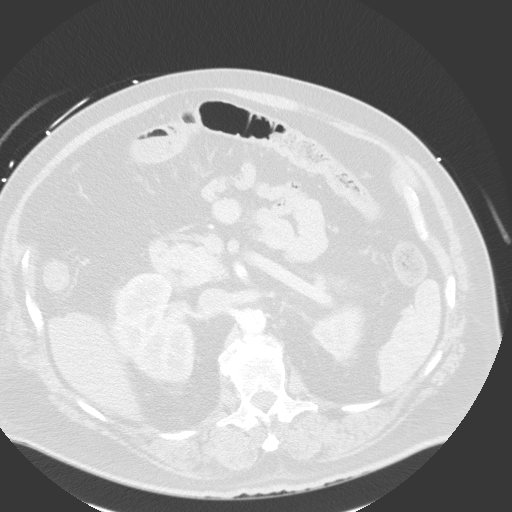
[im 11/71  lung]
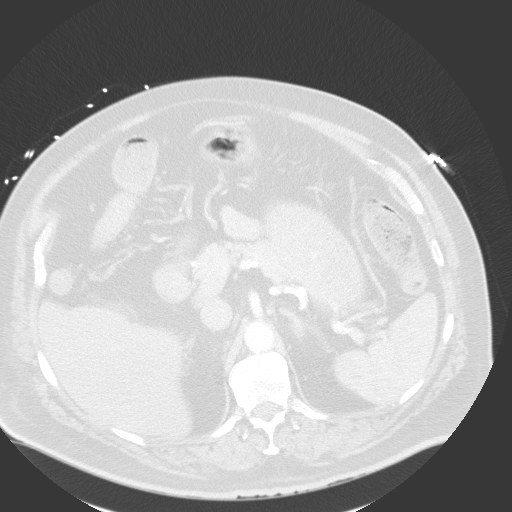
[im 16/71  lung]
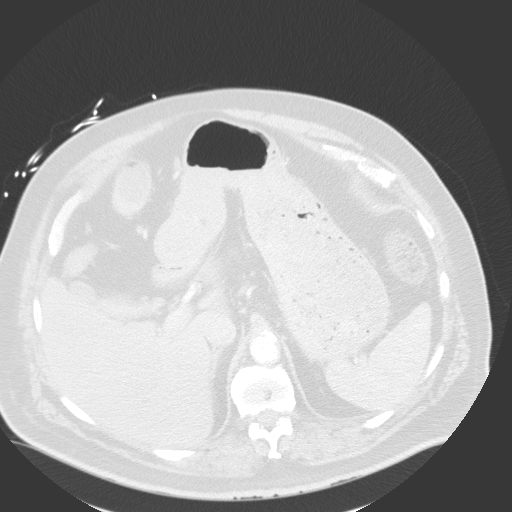
[im 21/71  lung]
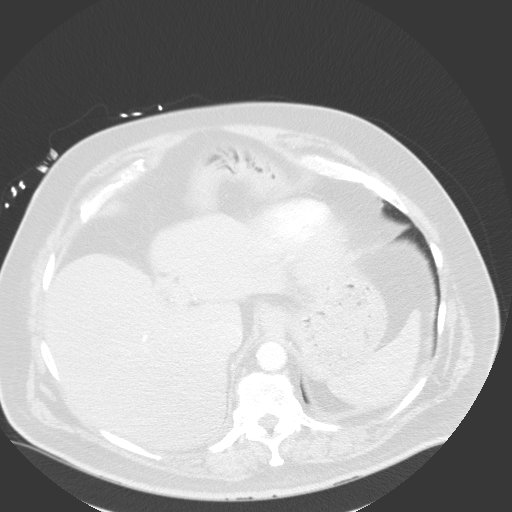
[im 26/71  mediastinal]
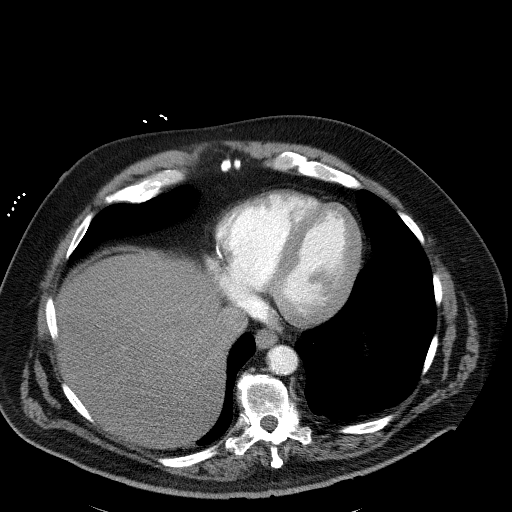
[im 26/71  lung]
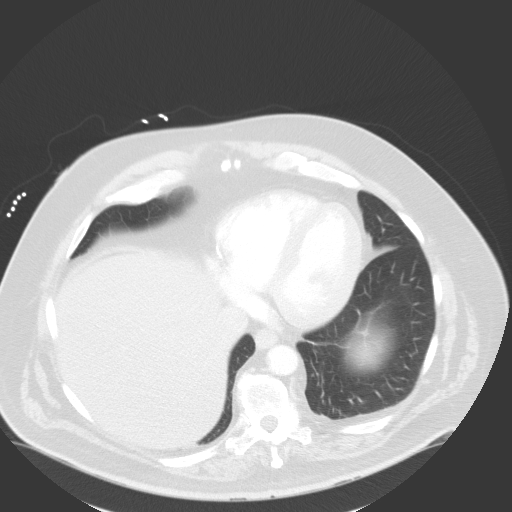
[im 32/71  lung]
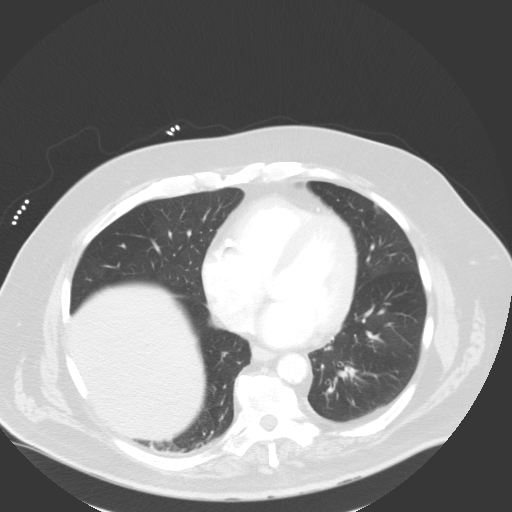
[im 39/71  lung]
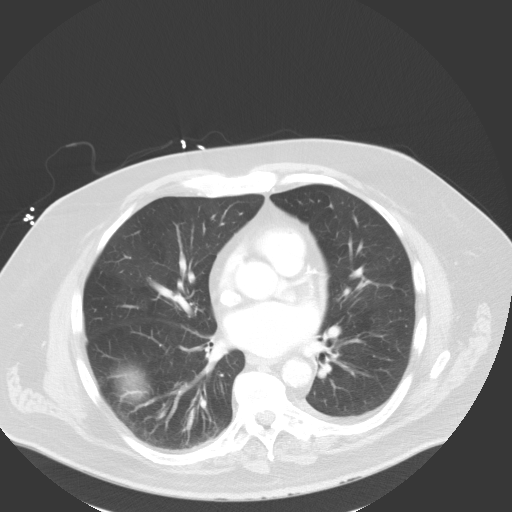
[im 45/71  lung]
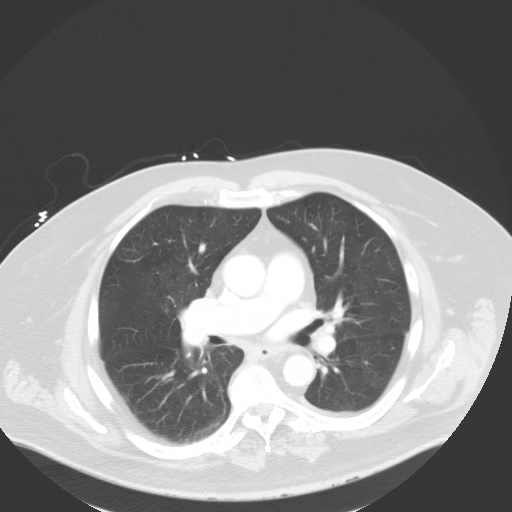
[im 50/71  mediastinal]
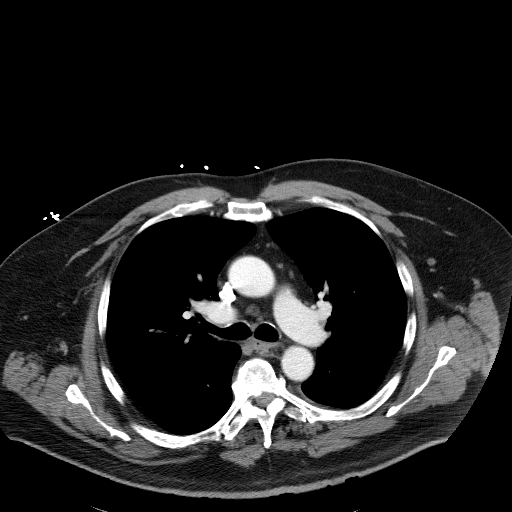
[im 50/71  lung]
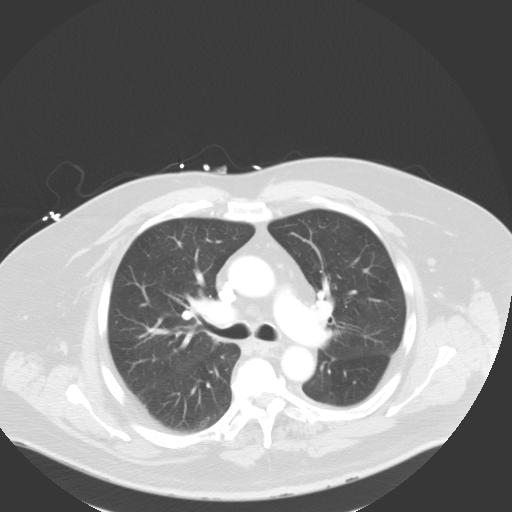
[im 55/71  lung]
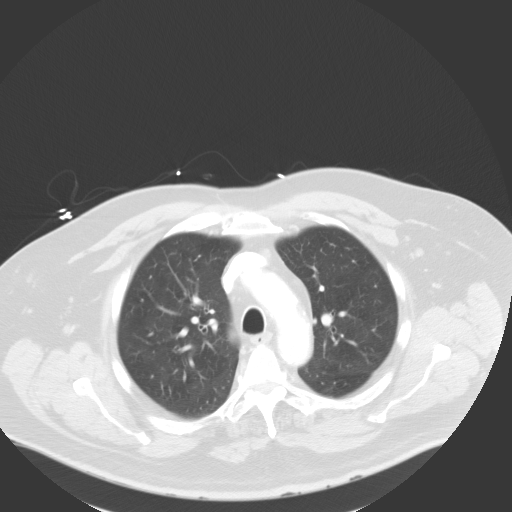
[im 60/71  lung]
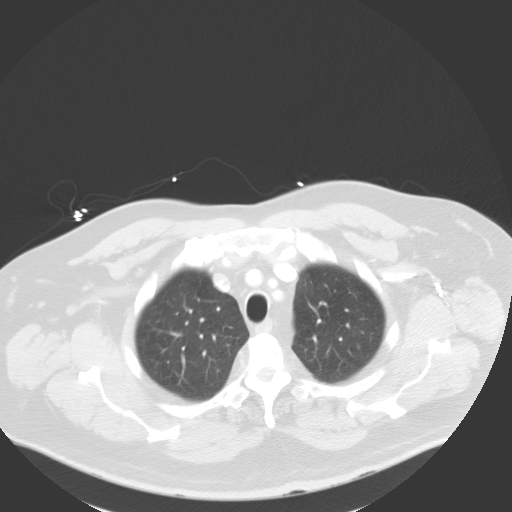
[im 65/71  lung]
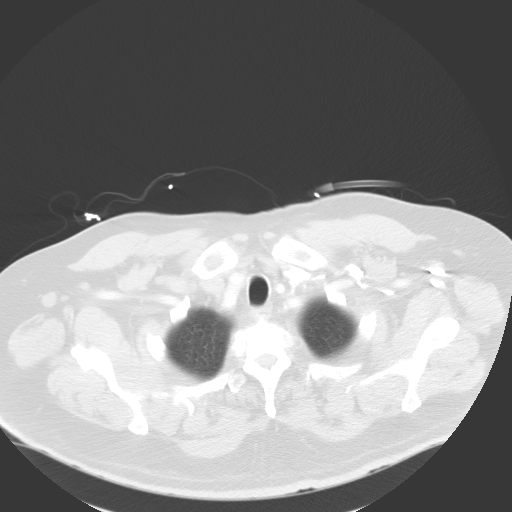

[Series 5: coronal · coronal · 0.69mm/px · 3 of 106 slices shown]
[im 22/106  lung]
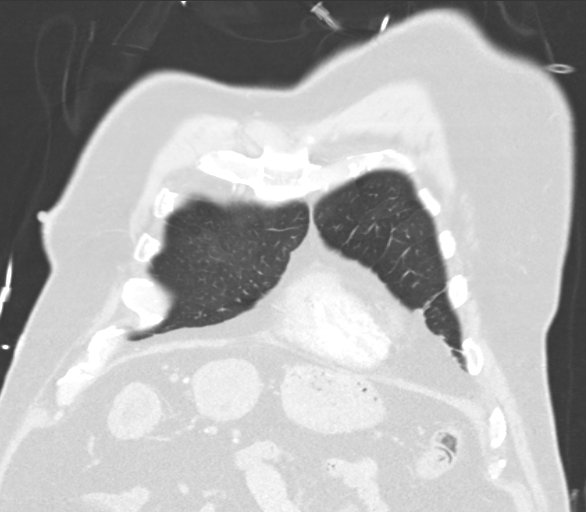
[im 43/106  lung]
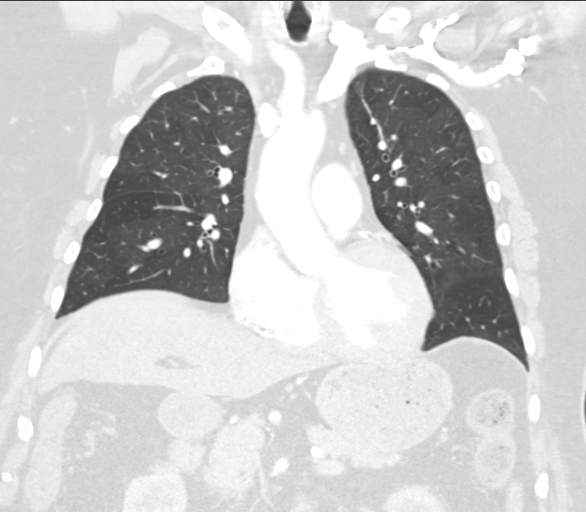
[im 64/106  lung]
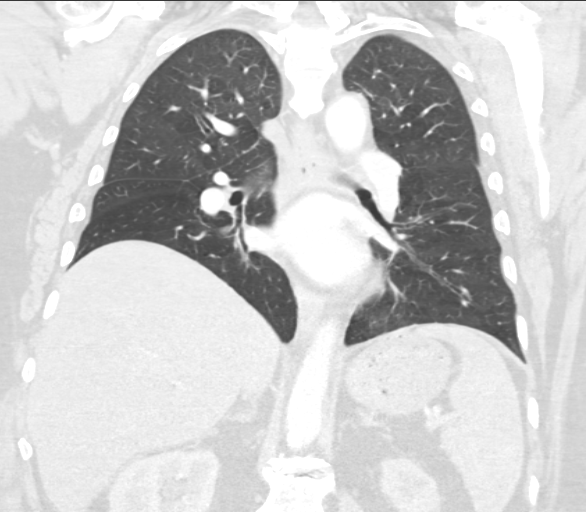

[15 of 36 positions shown; findings below may reference images not displayed]

FINDINGS: No pathologically enlarged mediastinal, hilar or axillary lymph
nodes. Three-vessel coronary artery calcification. Heart size
normal. No pericardial effusion.

Image quality is somewhat degraded by respiratory motion. There is
mild mosaic attenuation in the lungs. Minimal dependent atelectasis
or scarring in the right lower lobe. No pleural fluid. Airway is
unremarkable.

Incidental imaging of the upper abdomen shows the visualized
portions of the liver, gallbladder and adrenal glands to be grossly
unremarkable. A stone is seen in the right kidney. Probable renal
vascular calcification in the left kidney. Visualized portions of
the spleen, pancreas, stomach and bowel are otherwise grossly
unremarkable. No upper abdominal adenopathy. No worrisome lytic or
sclerotic lesions. Degenerative changes are seen in the spine.
IMPRESSION: 1. No acute findings to explain the patient's given symptoms.
2. Three-vessel coronary artery calcification.
3. Mild mosaic attenuation the lungs is nonspecific and can be seen
in the setting of small airways disease.
4. Right renal stone.

## 2014-04-24 SURGERY — ECHOCARDIOGRAM, TRANSESOPHAGEAL
Anesthesia: Moderate Sedation

## 2014-04-24 MED ORDER — MIDAZOLAM HCL 10 MG/2ML IJ SOLN
INTRAMUSCULAR | Status: DC | PRN
  Administered 2014-04-24 (×2): 2 mg via INTRAVENOUS

## 2014-04-24 MED ORDER — BUTAMBEN-TETRACAINE-BENZOCAINE 2-2-14 % EX AERO
INHALATION_SPRAY | CUTANEOUS | Status: DC | PRN
  Administered 2014-04-24: 2 via TOPICAL

## 2014-04-24 MED ORDER — MIDAZOLAM HCL 5 MG/ML IJ SOLN
INTRAMUSCULAR | Status: AC
  Filled 2014-04-24: qty 2

## 2014-04-24 MED ORDER — SODIUM CHLORIDE 0.9 % IV SOLN: INTRAVENOUS | Status: DC

## 2014-04-24 MED ORDER — FENTANYL CITRATE 0.05 MG/ML IJ SOLN
INTRAMUSCULAR | Status: DC | PRN
  Administered 2014-04-24: 25 ug via INTRAVENOUS
  Administered 2014-04-24: 50 ug via INTRAVENOUS

## 2014-04-24 MED ORDER — FENTANYL CITRATE 0.05 MG/ML IJ SOLN
INTRAMUSCULAR | Status: AC
  Filled 2014-04-24: qty 2

## 2014-04-24 MED ORDER — DIPHENHYDRAMINE HCL 50 MG/ML IJ SOLN
INTRAMUSCULAR | Status: AC
  Filled 2014-04-24: qty 1

## 2014-04-24 MED ORDER — IOHEXOL 300 MG/ML  SOLN
75.0000 mL | Freq: Once | INTRAMUSCULAR | Status: AC | PRN
  Administered 2014-04-24: 75 mL via INTRAVENOUS

## 2014-04-24 NOTE — H&P (View-Only) (Signed)
SUBJECTIVE: Pt slept in chair last night. Denies chest pain and shortness of breath. Had feet swelling which has gone down. Denies PND.     Intake/Output Summary (Last 24 hours) at 04/23/14 0919 Last data filed at 04/23/14 0545  Gross per 24 hour  Intake    180 ml  Output   1950 ml  Net  -1770 ml    Current Facility-Administered Medications  Medication Dose Route Frequency Provider Last Rate Last Dose  . 0.9 %  sodium chloride infusion  250 mL Intravenous PRN Ok Anis, NP      . acetaminophen (TYLENOL) tablet 650 mg  650 mg Oral Q4H PRN Ok Anis, NP      . amLODipine (NORVASC) tablet 5 mg  5 mg Oral Daily Ok Anis, NP   5 mg at 04/22/14 1106  . aspirin EC tablet 81 mg  81 mg Oral Daily Ok Anis, NP   81 mg at 04/22/14 1106  . atorvastatin (LIPITOR) tablet 80 mg  80 mg Oral q1800 Ok Anis, NP   80 mg at 04/22/14 1700  . carvedilol (COREG) tablet 6.25 mg  6.25 mg Oral BID WC Ok Anis, NP   6.25 mg at 04/23/14 0630  . colchicine tablet 0.6 mg  0.6 mg Oral BID Ok Anis, NP   0.6 mg at 04/22/14 2128  . furosemide (LASIX) tablet 40 mg  40 mg Oral Daily Laqueta Linden, MD   40 mg at 04/22/14 1304  . gabapentin (NEURONTIN) capsule 300 mg  300 mg Oral TID Ok Anis, NP   300 mg at 04/22/14 2128  . heparin ADULT infusion 100 units/mL (25000 units/250 mL)  2,200 Units/hr Intravenous Continuous Lennon Alstrom, RPH 22 mL/hr at 04/22/14 2352 2,200 Units/hr at 04/22/14 2352  . insulin aspart (novoLOG) injection 0-15 Units  0-15 Units Subcutaneous TID WC Ok Anis, NP      . lisinopril (PRINIVIL,ZESTRIL) tablet 20 mg  20 mg Oral Daily Ok Anis, NP   20 mg at 04/22/14 1105  . nitroGLYCERIN (NITROSTAT) SL tablet 0.4 mg  0.4 mg Sublingual Q5 Min x 3 PRN Ok Anis, NP      . ondansetron [facility]) injection 4 mg  4 mg Intravenous Q6H PRN Ok Anis, NP      .  pantoprazole (PROTONIX) EC tablet 40 mg  40 mg Oral Q0600 Ok Anis, NP   40 mg at 04/23/14 0543  . sodium chloride 0.9 % injection 3 mL  3 mL Intravenous Q12H Ok Anis, NP   3 mL at 04/22/14 2129  . sodium chloride 0.9 % injection 3 mL  3 mL Intravenous PRN Ok Anis, NP        Filed Vitals:   04/22/14 1405 04/22/14 2121 04/23/14 0540 04/23/14 0639  BP: 106/53 129/66 130/75   Pulse: 66 60 58   Temp: 97.6 F (36.4 C) 97.6 F (36.4 C) 97.8 F (36.6 C)   TempSrc: Oral Oral Oral   Resp: 18 20 19    Height:      Weight:    291 lb 14.2 oz (132.4 kg)  SpO2: 96% 97%      PHYSICAL EXAM General: NAD, morbidly obese  HEENT: Normal.  Neck: JVP difficult to assess given habitus.  Lungs: Diminished sounds at bases but otherwise clear bilaterally with normal respiratory effort.  CV: Distant heart sounds. Regular rate and rhythm,  normal S1/S2, no S3/S4, no murmur. No pretibial edema.  Abdomen: Soft, nontender, obse.  Neurologic: Alert and oriented x 3.  Psych: Normal affect.  Musculoskeletal: Normal range of motion. No gross deformities.  Extremities: No clubbing or cyanosis. Mild dorsal pedal edema b/l.  TELEMETRY: Reviewed telemetry pt in sinus rhythm with occasional PVC's.  LABS: Basic Metabolic Panel:  Recent Labs  16/10/96 0435  NA 140  K 4.5  CL 98  CO2 31  GLUCOSE 106*  BUN 25*  CREATININE 1.04  CALCIUM 8.7   Liver Function Tests:  Recent Labs  04/22/14 0435  AST 22  ALT 15  ALKPHOS 66  BILITOT 0.7  PROT 6.4  ALBUMIN 3.3*   No results found for this basename: LIPASE, AMYLASE,  in the last 72 hours CBC:  Recent Labs  04/22/14 1320 04/23/14 0550  WBC 7.1 7.1  HGB 14.1 14.6  HCT 44.7 46.3  MCV 90.3 91.9  PLT 181 176   Cardiac Enzymes: No results found for this basename: CKTOTAL, CKMB, CKMBINDEX, TROPONINI,  in the last 72 hours BNP: No components found with this basename: POCBNP,  D-Dimer: No results found for this  basename: DDIMER,  in the last 72 hours Hemoglobin A1C: No results found for this basename: HGBA1C,  in the last 72 hours Fasting Lipid Panel: No results found for this basename: CHOL, HDL, LDLCALC, TRIG, CHOLHDL, LDLDIRECT,  in the last 72 hours Thyroid Function Tests: No results found for this basename: TSH, T4TOTAL, FREET3, T3FREE, THYROIDAB,  in the last 72 hours Anemia Panel: No results found for this basename: VITAMINB12, FOLATE, FERRITIN, TIBC, IRON, RETICCTPCT,  in the last 72 hours  RADIOLOGY: No results found.    ASSESSMENT AND PLAN: 1. NSTEMI/severe 3-vessel CAD:  One month h/o progressive DOE, increasing abd girth, and lower extremity edema with acute dyspnea, diaphoresis, and marked hypertension at [place].  CXR showed CHF and troponin rose to 0.81.  --Cath on 8/28 reportedly showed severe 3 vessel disease. Will plan to d/c heparin on 8/31 am.  --Severely reduced EF on ECHO 25 to 30%, possible wall motion abnormality. Given poor visualization, as per CT surg, will plan to proceed with TEE on 8/31. --Cont asa, Coreg, lisinopril, and Lipitor.  --CT surgery evaluated pt on 8/29, and not deemed to be a suitable candidate for surgery at the present time. Needs optimization of clinical status.  2. Acute Systolic CHF:  In setting of hypertensive emergency and NSTEMI. BP remains controlled today.  Creat bumped and Lasix held on 8/28. I resumed on 8/29, and pt has had 2.3 liters of output in past 48 hrs. Will check BMET.  Cont Coreg and lisinopril. Follow.   3. Hypertensive Emergency:  In setting of above. BP now well controlled on Coreg, lisinopril, and amlodipine.   4. Hyperlipidemia:  In setting of ACS, cont lipitor 80.   5. Morbid Obesity:  Will benefit from cardiac rehab and outpt sleep eval.   6. DM:  Currently on insulin, metformin at home.   Prentice Docker, M.D., F.A.C.C.

## 2014-04-24 NOTE — Progress Notes (Signed)
VASCULAR LAB PRELIMINARY  PRELIMINARY  PRELIMINARY  PRELIMINARY  Pre-op Cardiac Surgery  Carotid Findings:  Bilateral:  1-39% ICA stenosis.  Vertebral artery flow is antegrade.      Upper Extremity Right Left  Brachial Pressures 117 biphasic 120 triphasic  Radial Waveforms triphasic triphasic  Ulnar Waveforms triphasic triphasic  Palmar Arch (Allen's Test) * **   Findings:  *Right:  Doppler waveforms remain normal with ulnar and radial compressions.  **Left:  Doppler waveforms decrease 50% with ulnar and remain normal with radial compressions.    Lower  Extremity Right Left  Dorsalis Pedis    Anterior Tibial 127 Severely dampened monophasic 133 monophasic  Posterior Tibial 141 biphasic 132 monophasic  Ankle/Brachial Indices >1.0 >1.0    Findings:  ABI is within normal limits with abnormal Doppler waveforms.   Violet Cart, RVT 04/24/2014, 1:47 PM

## 2014-04-24 NOTE — Progress Notes (Signed)
CARDIAC REHAB PHASE I   PRE:  Rate/Rhythm: 68 SR  BP:  Supine:   Sitting: 118/50  Standing:    SaO2: 94 2L  MODE:  Ambulation: 450 ft   POST:  Rate/Rhythm: 80  BP:  Supine:   Sitting: 100/60  Standing:    SaO2: 86-88 RA 1430-1505 On arrival pt in recliner on O2 2L sat 94%. O2 discontinued sat RA 91%. Assisted X 1 and used walker to ambulate.Gait steady with walker. Pt able to walk 450 feet RA sat in hall 87-88%. He denies any SOB or pain walking. On return to room RA sat 86-88%. Pt to bathroom after walk.  Melina Copa RN 04/24/2014 3:02 PM

## 2014-04-24 NOTE — Interval H&P Note (Signed)
History and Physical Interval Note:  04/24/2014 9:10 AM  [name] [name]  has presented today for surgery, with the diagnosis of mitral valve abnorma  The various methods of treatment have been discussed with the patient and family. After consideration of risks, benefits and other options for treatment, the patient has consented to  Procedure(s): TRANSESOPHAGEAL ECHOCARDIOGRAM (TEE) (N/A) as a surgical intervention .  The patient's history has been reviewed, patient examined, no change in status, stable for surgery.  I have reviewed the patient's chart and labs.  Questions were answered to the patient's satisfaction.     Elyn Aquas.

## 2014-04-24 NOTE — Progress Notes (Signed)
ANTICOAGULATION CONSULT NOTE - Follow Up Consult  Pharmacy Consult for Heparin Indication: CAD  No Known Allergies  Patient Measurements: Height: 6' (182.9 cm) Weight: 290 lb 9.1 oz (131.8 kg) IBW/kg (Calculated) : 77.6 Heparin Dosing Weight:   Vital Signs: Temp: 98.7 F (37.1 C) (08/31 0940) Temp src: Oral (08/31 0940) BP: 121/72 mmHg (08/31 0955) Pulse Rate: 68 (08/31 0955)  Labs:  Recent Labs  04/22/14 0435  04/22/14 1320 04/22/14 2220 04/23/14 0550 04/23/14 0940 04/24/14 0407  HGB  --   < > 14.1  --  14.6  --  14.3  HCT  --   --  44.7  --  46.3  --  45.0  PLT  --   --  181  --  176  --  180  HEPARINUNFRC 0.15*  --  0.19* 0.33 0.48  --  0.30  CREATININE 1.04  --   --   --   --  0.99 1.14  < > = values in this interval not displayed.  Estimated Creatinine Clearance: 84.7 ml/min (by C-G formula based on Cr of 1.14).   Assessment: 73yo male admitted 04/21/2014 with CP s/p cath finding 3v CAD, s/p cardiac surgery eval but not a candidate. Pharmacy consulted to continue heparin through 8/31 am  PMH: HF EF 25-30% by echo, CAD, COPD,   Anticoag: ACS with CAD. Heparin level 0.3 in goal. CBC stable.  Cardiovascular: HTN / HLD / CHF (EF 25-30%) - cath showed multivessel CAD, CVTS rec TEE to eval valve fxn. TEE with EF 30-35%, no thrombus. Norvasc5, ASA81, Lipitor80, Coreg, Lasix, lisinopril.   Endocrinology: DM, CBGs <102-120 on SSI  Gastrointestinal / Nutrition: obesity - LFTs WNL - heart diet, PPI PO  Neurology: gabapentin  Nephrology: gout on colchicine. CKD - SCr 1.04, lytes WNL  Pulmonary: pulm HTN - satting well on 2L [place]  Hematology / Oncology: CBC stable  PTA Medication Issues: cyanocobalamin, Vit D, metformin  Best Practices: heparin gtt   Goal of Therapy:  Heparin level 0.3-0.7 units/ml Monitor platelets by anticoagulation protocol: Yes   Plan:   Continue IV heparin 2200 units/hr    Cypress Fanfan S. Merilynn Finland, PharmD, BCPS Clinical Staff  Pharmacist Pager [phone]  [name] 04/24/2014,10:37 AM

## 2014-04-24 NOTE — CV Procedure (Addendum)
    Transesophageal Echocardiogram Note  [name] [name] [id number] Nov 16, 1940  Procedure: Transesophageal Echocardiogram Indications: CHF, evaluate MV  Procedure Details Consent: Obtained Time Out: Verified patient identification, verified procedure, site/side was marked, verified correct patient position, special equipment/implants available, Radiology Safety Procedures followed,  medications/allergies/relevent history reviewed, required imaging and test results available.  Performed  Medications: Fentanyl: 75 mcg iv Versed: 4 mg iv  Left Ventrical:  Severely depressed LV function,  EF 30-35%  Right ventrical:  Severe RV dysfunctioni.  Mitral Valve: mild - moderate MR  Aortic Valve: normal AV  Tricuspid Valve: mild TR  Pulmonic Valve: poorly visualized  Left Atrium/ Left atrial appendage:  Extensive spontaneous contrast ( "smoke") , no thrombus in the LAA.  Atrial septum: no ASD or PFO  Aorta: mild calcification   Complications: No apparent complications Patient did tolerate procedure well.   [name], [name]., MD, [facility] 04/24/2014, 9:30 AM

## 2014-04-24 NOTE — Progress Notes (Signed)
Patient Name: [name] [name] Date of Encounter: 04/24/2014  Principal Problem:   NSTEMI (non-ST elevated myocardial infarction) Active Problems:   Diabetes mellitus without complication   Acute systolic CHF (congestive heart failure), NYHA class 3   Hyperlipidemia   Hypertension   Morbid obesity    Patient Profile: 73 yo male w/ hx HTN, DM, HLD, was admitted 08/28 with chest pain. 3V dz at cath, not CABG candidate, EF 25-30%. Acute S-CHF, initially req BiPAP. Needs TEE to eval MV and bi-vent function  SUBJECTIVE: Breathing well, no chest pain. SOB improved after Lasix  OBJECTIVE Filed Vitals:   04/24/14 0629 04/24/14 0634 04/24/14 0850 04/24/14 0859  BP: 105/58  142/77 121/66  Pulse: 62     Temp: 97.9 F (36.6 C)     TempSrc: Oral     Resp: 18  19 17   Height:      Weight:  290 lb 9.1 oz (131.8 kg)    SpO2: 96%  95% 95%    Intake/Output Summary (Last 24 hours) at 04/24/14 0909 Last data filed at 04/24/14 0532  Gross per 24 hour  Intake    483 ml  Output   1875 ml  Net  -1392 ml   Filed Weights   04/22/14 0507 04/23/14 0639 04/24/14 0634  Weight: 295 lb 10.2 oz (134.1 kg) 291 lb 14.2 oz (132.4 kg) 290 lb 9.1 oz (131.8 kg)    PHYSICAL EXAM General: Well developed, well nourished, male in no acute distress. Head: Normocephalic, atraumatic.  Neck: Supple without bruits, JVD not elevated. Lungs:  Resp regular and unlabored, few rales bases Heart: RRR, S1, S2, no S3, S4, no significant murmur, no rub. Abdomen: Soft, non-tender, non-distended, BS + x 4.  Extremities: No clubbing, cyanosis, no edema.  Neuro: Alert and oriented X 3. Moves all extremities spontaneously. Psych: Normal affect.  LABS: CBC:  Recent Labs  04/23/14 0550 04/24/14 0407  WBC 7.1 7.1  HGB 14.6 14.3  HCT 46.3 45.0  MCV 91.9 91.5  PLT 176 180   Basic Metabolic Panel:  Recent Labs  16/38/46 0940 04/24/14 0407  NA 139 141  K 4.1 3.9  CL 99 100  CO2 29 31  GLUCOSE 129*  106*  BUN 21 20  CREATININE 0.99 1.14  CALCIUM 8.7 9.1   Liver Function Tests:  Recent Labs  04/22/14 0435  AST 22  ALT 15  ALKPHOS 66  BILITOT 0.7  PROT 6.4  ALBUMIN 3.3*  BNP (last 3 results) No results found for this basename: PROBNP,  in the last 8760 hours Hemoglobin A1C: Pending  Fasting Lipid Panel: Pending  TELE:  SR, PVCs and occasional pairs      Radiology/Studies: CTA pending  Current Medications:  . amLODipine  5 mg Oral Daily  . aspirin EC  81 mg Oral Daily  . atorvastatin  80 mg Oral q1800  . carvedilol  6.25 mg Oral BID WC  . colchicine  0.6 mg Oral BID  . furosemide  40 mg Oral Daily  . gabapentin  300 mg Oral TID  . insulin aspart  0-15 Units Subcutaneous TID WC  . lisinopril  20 mg Oral Daily  . pantoprazole  40 mg Oral Q0600  . sodium chloride  3 mL Intravenous Q12H   . sodium chloride    . heparin 2,200 Units/hr (04/23/14 2307)    ASSESSMENT AND PLAN: Principal Problem:   NSTEMI (non-ST elevated myocardial infarction) - for now, med rx for  CAD. Has been seen by TCTS, f/u to consider CABG, depending on progress with CRFs and general medical improvement. On ASA, BB, statin, ACE  Active Problems:   Acute systolic CHF - now on PO Lasix, follow labs, I/O, weights    Morbid obesity - HH low sodium ADA diet    Hyperlipidemia - on increased rx, profile pending    Hypertension - SBP 100-140s on current rx, atenolol 25 mg and Norvasc 5 mg are home meds, on hold.    Diabetes mellitus without complication - A1c pending, on SSI, metformin held.  Plan - CTA pending, ordered by TCTS, for TEE today to eval MV and biventricular failure.   Signed, Theodore Demark , PA-C 9:09 AM 04/24/2014   Patient seen and examined. Agree with assessment and plan. I/O - 3484 since admission. For TEE today. On Ace-I, BB amlodipine, diuretic and statin. No chest pain. Breathing better. Continue to titrate carvedilol and ACE-I as BP allows.    Lennette Bihari, MD,  [facility] 04/24/2014 9:39 AM

## 2014-04-24 NOTE — Progress Notes (Signed)
Day of Surgery Procedure(s) (LRB): TRANSESOPHAGEAL ECHOCARDIOGRAM (TEE) (N/A) Subjective: No chest pain O2sats 95% on 2 L TTE  Severe biventricular dysfunction EF .30  Mod MR ( posterior jet)  Carotid Dopplers ok, ABI's ok PC02 55- PFT's pending CT chest unremarkable  Objective: Vital signs in last 24 hours: Temp:  [97.9 F (36.6 C)-98.7 F (37.1 C)] 98.7 F (37.1 C) (08/31 0940) Pulse Rate:  [55-75] 68 (08/31 0955) Cardiac Rhythm:  [-] Heart block (08/30 2021) Resp:  [11-23] 16 (08/31 0955) BP: (105-147)/(46-109) 121/72 mmHg (08/31 0955) SpO2:  [90 %-98 %] 95 % (08/31 0955) Weight:  [290 lb 9.1 oz (131.8 kg)] 290 lb 9.1 oz (131.8 kg) (08/31 0634)  Hemodynamic parameters for last 24 hours:  nsr  afebrile  Intake/Output from previous day: 08/30 0701 - 08/31 0700 In: 723 [P.O.:720; I.V.:3] Out: 1875 [Urine:1875] Intake/Output this shift:      Lab Results:  Recent Labs  04/23/14 0550 04/24/14 0407  WBC 7.1 7.1  HGB 14.6 14.3  HCT 46.3 45.0  PLT 176 180   BMET:  Recent Labs  04/23/14 0940 04/24/14 0407  NA 139 141  K 4.1 3.9  CL 99 100  CO2 29 31  GLUCOSE 129* 106*  BUN 21 20  CREATININE 0.99 1.14  CALCIUM 8.7 9.1    PT/INR: No results found for this basename: LABPROT, INR,  in the last 72 hours ABG    Component Value Date/Time   PHART 7.374 04/23/2014 1550   HCO3 31.7* 04/23/2014 1550   TCO2 33.4 04/23/2014 1550   O2SAT 94.4 04/23/2014 1550   CBG (last 3)   Recent Labs  04/23/14 2127 04/24/14 0625 04/24/14 1151  GLUCAP 102* 120* 105*    Assessment/Plan: S/P Procedure(s) (LRB): TRANSESOPHAGEAL ECHOCARDIOGRAM (TEE) (N/A) Biventricular  Failure- sitll volume overloaded Sig 2 vessel CAD w/ mod MR Would benefit from outpatient Adv HF clinic tune-up before possible later high risk CABG- MVR   LOS: 3 days    VAN TRIGT III,PETER 04/24/2014

## 2014-04-24 NOTE — Progress Notes (Signed)
Echocardiogram Echocardiogram Transesophageal has been performed.  [name] [name] 04/24/2014, 10:09 AM

## 2014-04-25 ENCOUNTER — Telehealth: Payer: Self-pay

## 2014-04-25 ENCOUNTER — Encounter (HOSPITAL_COMMUNITY): Payer: Self-pay | Admitting: Cardiovascular Disease

## 2014-04-25 DIAGNOSIS — E669 Obesity, unspecified: Secondary | ICD-10-CM | POA: Diagnosis present

## 2014-04-25 DIAGNOSIS — I5022 Chronic systolic (congestive) heart failure: Secondary | ICD-10-CM | POA: Diagnosis present

## 2014-04-25 DIAGNOSIS — I2510 Atherosclerotic heart disease of native coronary artery without angina pectoris: Secondary | ICD-10-CM | POA: Diagnosis present

## 2014-04-25 DIAGNOSIS — Z01818 Encounter for other preprocedural examination: Secondary | ICD-10-CM

## 2014-04-25 DIAGNOSIS — M109 Gout, unspecified: Secondary | ICD-10-CM | POA: Diagnosis present

## 2014-04-25 LAB — CBC
HCT: 44.2 % (ref 39.0–52.0)
Hemoglobin: 14.2 g/dL (ref 13.0–17.0)
MCH: 28.7 pg (ref 26.0–34.0)
MCHC: 32.1 g/dL (ref 30.0–36.0)
MCV: 89.3 fL (ref 78.0–100.0)
Platelets: 164 10*3/uL (ref 150–400)
RBC: 4.95 MIL/uL (ref 4.22–5.81)
RDW: 13.3 % (ref 11.5–15.5)
WBC: 7.8 10*3/uL (ref 4.0–10.5)

## 2014-04-25 LAB — BASIC METABOLIC PANEL
Anion gap: 10 (ref 5–15)
BUN: 20 mg/dL (ref 6–23)
CALCIUM: 8.9 mg/dL (ref 8.4–10.5)
CO2: 32 meq/L (ref 19–32)
CREATININE: 1.17 mg/dL (ref 0.50–1.35)
Chloride: 100 mEq/L (ref 96–112)
GFR calc Af Amer: 71 mL/min — ABNORMAL LOW (ref >90)
GFR calc non Af Amer: 61 mL/min — ABNORMAL LOW (ref >90)
Glucose, Bld: 124 mg/dL — ABNORMAL HIGH (ref 70–99)
Potassium: 4 mEq/L (ref 3.7–5.3)
Sodium: 142 mEq/L (ref 137–147)

## 2014-04-25 LAB — HEPARIN LEVEL (UNFRACTIONATED)
HEPARIN UNFRACTIONATED: 0.65 [IU]/mL (ref 0.30–0.70)
Heparin Unfractionated: 0.73 IU/mL — ABNORMAL HIGH (ref 0.30–0.70)

## 2014-04-25 LAB — PRO B NATRIURETIC PEPTIDE: Pro B Natriuretic peptide (BNP): 308.5 pg/mL — ABNORMAL HIGH (ref 0–125)

## 2014-04-25 LAB — GLUCOSE, CAPILLARY
Glucose-Capillary: 152 mg/dL — ABNORMAL HIGH (ref 70–99)
Glucose-Capillary: 102 mg/dL — ABNORMAL HIGH (ref 70–99)
Glucose-Capillary: 107 mg/dL — ABNORMAL HIGH (ref 70–99)

## 2014-04-25 MED ORDER — ATORVASTATIN CALCIUM 80 MG PO TABS: 80.0000 mg | ORAL_TABLET | Freq: Every day | ORAL | Status: DC

## 2014-04-25 MED ORDER — FUROSEMIDE 40 MG PO TABS: 40.0000 mg | ORAL_TABLET | Freq: Every day | ORAL | Status: DC

## 2014-04-25 MED ORDER — NITROGLYCERIN 0.4 MG SL SUBL: 0.4000 mg | SUBLINGUAL_TABLET | SUBLINGUAL | Status: DC | PRN

## 2014-04-25 MED ORDER — CARVEDILOL 6.25 MG PO TABS: 6.2500 mg | ORAL_TABLET | Freq: Two times a day (BID) | ORAL | Status: DC

## 2014-04-25 MED ORDER — METFORMIN HCL 1000 MG PO TABS: 1000.0000 mg | ORAL_TABLET | Freq: Two times a day (BID) | ORAL | Status: DC

## 2014-04-25 NOTE — Progress Notes (Signed)
ANTICOAGULATION CONSULT NOTE - Follow Up Consult  Pharmacy Consult for Heparin Indication: CAD  No Known Allergies  Patient Measurements: Height: 6' (182.9 cm) Weight: 289 lb 0.4 oz (131.1 kg) IBW/kg (Calculated) : 77.6 Heparin Dosing Weight:    Vital Signs: Temp: 99.4 F (37.4 C) (09/01 0610) Temp src: Oral (09/01 0610) BP: 119/54 mmHg (09/01 0701) Pulse Rate: 61 (09/01 0701)  Labs:  Recent Labs  04/23/14 0550 04/23/14 0940 04/24/14 0407 04/25/14 0302 04/25/14 1000  HGB 14.6  --  14.3 14.2  --   HCT 46.3  --  45.0 44.2  --   PLT 176  --  180 164  --   HEPARINUNFRC 0.48  --  0.30 0.73* 0.65  CREATININE  --  0.99 1.14 1.17  --     Estimated Creatinine Clearance: 82.3 ml/min (by C-G formula based on Cr of 1.17).   Assessment:  73yo male admitted 04/21/2014 with CP s/p cath finding 3v CAD, s/p cardiac surgery eval but not a candidate. Pharmacy consulted to continue heparin through 8/31 am  PMH: HF EF 25-30% by echo, CAD, COPD,   Anticoag: ACS with CAD. Heparin level 0.65 back in goal. CBC stable.  Cardiovascular: HTN / HLD / CHF (EF 25-30%) - cath showed 2vCAD. TEE with EF 30-35%, no thrombus. Biventricular failure. Norvasc5, ASA81, Lipitor80, Coreg, Lasix, lisinopril.   Endocrinology: DM, CBGs <102-120 on SSI  Gastrointestinal / Nutrition: obesity - LFTs WNL - heart diet, PPI PO  Neurology: gabapentin  Nephrology: gout on colchicine. CKD - SCr 1.17, lytes WNL  Pulmonary: pulm HTN - satting well on 2L [place]  Hematology / Oncology: CBC stable  PTA Medication Issues: cyanocobalamin, Vit D, metformin  Best Practices: heparin gtt   Goal of Therapy:  Heparin level 0.3-0.7 units/ml Monitor platelets by anticoagulation protocol: Yes   Plan:  Continue IV heparin at 2000 units/hr. Length of therapy? Daily HL and CBC.   [name] [name], PharmD, [facility] Clinical Staff Pharmacist Pager [phone]  [name] [name] 04/25/2014,10:46 AM

## 2014-04-25 NOTE — Progress Notes (Signed)
CARDIAC REHAB PHASE I   PRE:  Rate/Rhythm: 66 SR  BP:  Supine:   Sitting: 120/60  Standing:    SaO2: 94%2L  MODE:  Ambulation: 550 ft   POST:  Rate/Rhythm: 83  BP:  Supine:   Sitting: 130/72  Standing:    SaO2: 87%RA    2L 92%  SATURATION QUALIFICATIONS: (This note is used to comply with regulatory documentation for home oxygen)  Patient Saturations on Room Air at Rest = 91%  Patient Saturations on Room Air while Ambulating = 87%  Patient Saturations on 2 Liters of oxygen while Ambulating = 92%  Please briefly explain why patient needs home oxygen: desat with activity  [id number] Pt stated he might go home so I checked oxygen needs. Required 2L to keep sats up even though pt stated he was not SOB. Walked 550 ft with steady gait. Reviewed CHF booklet with when to call MD and purpose of daily weights. Explained CHF and why it is important to watch sodium and limit fluids. Pt stated he will need to get scales. Gave low sodium handouts.  To recliner after walk.     Luetta Nutting, RN BSN  04/25/2014 11:57 AM

## 2014-04-25 NOTE — Progress Notes (Signed)
ANTICOAGULATION CONSULT NOTE - Follow Up Consult  Pharmacy Consult for heparin Indication: CAD  Labs:  Recent Labs  04/22/14 0435  04/23/14 0550 04/23/14 0940 04/24/14 0407 04/25/14 0302  HGB  --   < > 14.6  --  14.3 14.2  HCT  --   < > 46.3  --  45.0 44.2  PLT  --   < > 176  --  180 164  HEPARINUNFRC 0.15*  < > 0.48  --  0.30 0.73*  CREATININE 1.04  --   --  0.99 1.14  --   < > = values in this interval not displayed.   Assessment: 73yo male now slightly subtherapeutic on heparin after two levels at low end of goal, large change in level from yesterday am, drawn correctly per RN.  Goal of Therapy:  Heparin level 0.3-0.7 units/ml   Plan:  Will decrease heparin gtt by 10% to 2000 units/hr and check level in 6hr.  Vernard Gambles, PharmD, BCPS  04/25/2014,3:50 AM

## 2014-04-25 NOTE — Clinical Documentation Improvement (Signed)
  Please clarify the term "hypertensive urgency". In the Coding world this equates to "benign hypertension". Thank you.  Possible Conditions - Malignant hypertension - Accelerated hypertension  - Other  Risk Factors: - in ED SBP 230's - NSTEMI on admit - Acute Systolic CHF on admit  Thank you,  Beverley Fiedler ,RN Clinical Documentation Specialist:  [phone]  [facility]- Health Information Management

## 2014-04-25 NOTE — Discharge Summary (Signed)
Discharge Summary   Patient ID: [name] [name] MRN: [id number], DOB/AGE: 1941-03-11 73 y.o. Admit date: 04/21/2014 D/C date:     04/25/2014  Primary Cardiologist: Dr. Kirke Corin ( new )  Principal Problem:   NSTEMI (non-ST elevated myocardial infarction) Active Problems:   Morbid obesity   Hyperlipidemia   Hypertension   Diabetes mellitus without complication   Acute systolic CHF (congestive heart failure), NYHA class 3   CAD (coronary artery disease)   Chronic systolic CHF (congestive heart failure)   Obesity   Gout   Admission Dates: 04/21/14- 04/25/14 Discharge Diagnosis: NSTEMI s/p LHC with severe 3V CAD referred for CABG c/b acute systolic CHF   HPI: [name] [name] is a 73 y.o. male with a history of  HTN/HL/DM and chest pain with low to moderate risk MV in 04/2013, who was transferred to [facility] from [facility] on 04/21/14 for consultation of severe 3V CAD as well as acute systolic CHF.   He lives by himself in [place] and generally gets around ok. About 1 month ago, he noted significant R>L foot and ankle edema. He does have some degree of chronic DOE but over the past month has noted progression of dyspnea, now occurring with minimal activity. He was admitted to [facility] on 04/21/14 with acute respiratory insufficiency and hypoxemia. In the preceding several days he had noticed an increase in weight, significant leg swelling and abdominal swelling, orthopnea and PND. He required BiPAP by EMT to maintain O2 sats greater than 80%. He was admitted to the hospital and found to have edema on chest x-ray with mildly positive cardiac enzymes. He was stabilized and underwent  Echocardiogram which was difficult to interpret because of poor images but he was felt to have EF of 25%. Coronary angiogram showed normal dominant right coronary, moderate stenosis of the proximal LAD, and high-grade stenosis of the OM 1 OM 2 branches of the circumflex which were small vessels. He underwent coronary  angiography via the right radial artery. Ventriculogram was not performed due to the patient's renal insufficiency. Right heart catheterization was performed with PA pressures elevated at 60/40, wedge 18, LVEDP 24, RV pressure 58/19 and cardiac index 2.4.  Hospital Course  NSTEMI:  -- Peak trop 0.8. ECHO with severely reduced EF on ECHO: 25-30% and possible wall motion abn.  -- Cath on 04/21/14 showed severe 3VD. RHC showed mildly elevated filling pressures with moderate pulmonary hypertension. Left ventricular angiography was not performed due to chronic kidney disease. No interventions were performed and patient referred for CABG. -- Transferred to [facility] on 04/21/14 for evaluation by CTCS. Dr Morton Peters saw the patient and deemed him as currently not a candidate for cardiac surgery because of his active heart failure symptoms, poor EF, pulmonary hypertension, renal insufficiency, and obesity ( 300 lbs). He recommended heart failure consultation and TEE to properly evaluate the mitral valve and biventricular function. While inpatient he underwent TEE, carotid Dopplers, ABI's, PFTs and CT chest. The TEE revealed severe biventricular dysfunction EF 30 and mod MR ( posterior jet). The other studies returned unremarkable. -- Final recommendation by CTCS is outpatient Adv HF clinic tune-up before possible later high risk CABG- MVR. He will follow up in [facility] per patient request.  Acute Systolic CHF: EF 40-98% by echo. CHF on CXR and BNP 4414 at [facility].  -- In setting of hypertensive emergency and NSTEMI.  -- He was diuresed with PO Lasix  qd. Net neg 3.6L Weight remained stable at 289lbs.  --  Continue Coreg 6.25 BID and Lisinopril . Continue Lasix  qd.  HLD- TC 88. TG 117. HDL 23. LDL 42. Continue atorvastatin .   Hypertensive Emergency: In setting of above. BP now well controlled on Coreg, lisinopril, and amlodipine.   Morbid Obesity: Will benefit from cardiac rehab and consider outpt  sleep eval.  DM- Resume metformin on Thursday 04/27/14 s/p contrast exposure on CTA on 04/24/14. -- HgA1c 6.6. Follow up with PCP.  AKI- now resolved. Creat on discharge was 1.17.  The patient has had an uncomplicated hospital course and is recovering well. The radial catheter site is stable. He has been seen by Dr. Tresa Endo today and deemed ready for discharge home. All follow-up appointments have been scheduled. Discharge medications are listed below.    Discharge Vitals: Blood pressure 119/54, pulse 61, temperature 99.4 F (37.4 C), temperature source Oral, resp. rate 18, height 6' (1.829 m), weight 289 lb 0.4 oz (131.1 kg), SpO2 98.00%.  Labs: Lab Results  Component Value Date   WBC 7.8 04/25/2014   HGB 14.2 04/25/2014   HCT 44.2 04/25/2014   MCV 89.3 04/25/2014   PLT 164 04/25/2014    Recent Labs Lab 04/22/14 0435  04/25/14 0302  NA 140  < > 142  K 4.5  < > 4.0  CL 98  < > 100  CO2 31  < > 32  BUN 25*  < > 20  CREATININE 1.04  < > 1.17  CALCIUM 8.7  < > 8.9  PROT 6.4  --   --   BILITOT 0.7  --   --   ALKPHOS 66  --   --   ALT 15  --   --   AST 22  --   --   GLUCOSE 106*  < > 124*  < > = values in this interval not displayed.  Lab Results  Component Value Date   CHOL 88 04/24/2014   HDL 23* 04/24/2014   LDLCALC 42 04/24/2014   TRIG 117 04/24/2014     Diagnostic Studies/Procedures   Ct Chest W Contrast  04/24/2014   CLINICAL DATA:  Recurrent chest pain and shortness of breath.  EXAM: CT CHEST WITH CONTRAST  TECHNIQUE: Multidetector CT imaging of the chest was performed during intravenous contrast administration.  CONTRAST:  75mL OMNIPAQUE IOHEXOL 300 MG/ML  SOLN  COMPARISON:  None.  FINDINGS: No pathologically enlarged mediastinal, hilar or axillary lymph nodes. Three-vessel coronary artery calcification. Heart size normal. No pericardial effusion.  Image quality is somewhat degraded by respiratory motion. There is mild mosaic attenuation in the lungs. Minimal dependent  atelectasis or scarring in the right lower lobe. No pleural fluid. Airway is unremarkable.  Incidental imaging of the upper abdomen shows the visualized portions of the liver, gallbladder and adrenal glands to be grossly unremarkable. A stone is seen in the right kidney. Probable renal vascular calcification in the left kidney. Visualized portions of the spleen, pancreas, stomach and bowel are otherwise grossly unremarkable. No upper abdominal adenopathy. No worrisome lytic or sclerotic lesions. Degenerative changes are seen in the spine.  IMPRESSION: 1. No acute findings to explain the patient's given symptoms. 2. Three-vessel coronary artery calcification. 3. Mild mosaic attenuation the lungs is nonspecific and can be seen in the setting of small airways disease. 4. Right renal stone.   04/24/14: Transesophageal Echocardiogram Note  [name] [name]  [id number]  09/23/1940  Procedure: Transesophageal Echocardiogram  Indications: CHF, evaluate MV  Procedure Details  Consent: Obtained  Time  Out: Verified patient identification, verified procedure, site/side was marked, verified correct patient position, special equipment/implants available, Radiology Safety Procedures followed, medications/allergies/relevent history reviewed, required imaging and test results available. Performed  Medications:  Fentanyl: 75 mcg iv  Versed: 4 mg iv  Left Ventrical: Severely depressed LV function, EF 30-35%  Right ventrical: Severe RV dysfunctioni.  Mitral Valve: mild - moderate MR  Aortic Valve: normal AV  Tricuspid Valve: mild TR  Pulmonic Valve: poorly visualized  Left Atrium/ Left atrial appendage: Extensive spontaneous contrast ( "smoke") , no thrombus in the LAA.  Atrial septum: no ASD or PFO  Aorta: mild calcification  Complications: No apparent complications  Patient did tolerate procedure well.   PROCEDURE: [facility] - ECHO DOPPLER COMPLETE(TRANSTHOR) - Apr 20 2014 9:32AM  RESULT: Echocardiogram Report    Patient Name: [name] [name] Date of Exam: 04/20/2014  Medical Rec #: [id number] Custom1:  Date of Birth: Jan 02, 1941 Height: 68.9 in  Patient Age: 45 years Weight: 277.8 lb  Patient Gender: M BSA: 2.37 m  Indications: CHF  Sonographer: Cristela Blue RDCS  Referring Phys: Angelica Ran, K  Sonographer Comments: Technically difficult study due to poor echo  windows and The best image quality is in the parasternal view.  Summary:  1. Challenging image quality.  2. Moderately to severely decreased global left ventricular systolic  function.  3. Left ventricular ejection fraction, by visual estimation, is grossly  25 to 30%.  4. Severe anterior and anteroseptal wall hypokinesis. Unable to exclude  other wall motion abnormality.  5. Mildly increased left ventricular internal cavity size.  6. RV was not well visualized, though grossly normal right ventricular  size and systolic function.  7. Mildly elevated RVSP.    VASCULAR LAB  PRELIMINARY PRELIMINARY PRELIMINARY PRELIMINARY  Pre-op Cardiac Surgery  Carotid Findings: Bilateral: 1-39% ICA stenosis. Vertebral artery flow is antegrade.  Upper Extremity  Right  Left   Brachial Pressures  117 biphasic  120 triphasic   Radial Waveforms  triphasic  triphasic   Ulnar Waveforms  triphasic  triphasic   Palmar Arch (Allen's Test)  *  **   Findings: *Right: Doppler waveforms remain normal with ulnar and radial compressions. **Left: Doppler waveforms decrease 50% with ulnar and remain normal with radial compressions.  Lower Extremity  Right  Left   Dorsalis Pedis     Anterior Tibial  127 Severely dampened monophasic  133 monophasic   Posterior Tibial  141 biphasic  132 monophasic   Ankle/Brachial Indices  >1.0  >1.0   Findings: ABI is within normal limits with abnormal Doppler waveforms.  Discharge Medications     Medication List    STOP taking these medications       ADVIL PM 200-25 MG Caps  Generic drug:  Ibuprofen-Diphenhydramine HCl      atenolol 25 MG tablet  Commonly known as:  TENORMIN     ibuprofen 800 MG tablet  Commonly known as:  ADVIL,MOTRIN     simvastatin 40 MG tablet  Commonly known as:  ZOCOR      TAKE these medications       amLODipine 5 MG tablet  Commonly known as:  NORVASC  Take 5 mg by mouth daily.     aspirin 81 MG tablet  Take 81 mg by mouth every morning.     atorvastatin 80 MG tablet  Commonly known as:  LIPITOR  Take 1 tablet (80 mg total) by mouth daily at 6 PM.     BLACK CHERRY CONCENTRATE  PO  Take 1 capsule by mouth daily.     carvedilol 6.25 MG tablet  Commonly known as:  COREG  Take 1 tablet (6.25 mg total) by mouth 2 (two) times daily with a meal.     colchicine 0.6 MG tablet  Take 0.6 mg by mouth daily as needed (for gout flareup).     cyanocobalamin 500 MCG tablet  Take 500 mcg by mouth daily.     ergocalciferol 50000 UNITS capsule  Commonly known as:  VITAMIN D2  Take 50,000 Units by mouth every 30 (thirty) days.     furosemide 40 MG tablet  Commonly known as:  LASIX  Take 1 tablet (40 mg total) by mouth daily.     gabapentin 300 MG capsule  Commonly known as:  NEURONTIN  Take 300 mg by mouth 4 (four) times daily.     lisinopril 20 MG tablet  Commonly known as:  PRINIVIL,ZESTRIL  Take 20 mg by mouth every morning.     metFORMIN 1000 MG tablet  Commonly known as:  GLUCOPHAGE  Take 1 tablet (1,000 mg total) by mouth 2 (two) times daily with a meal.  Start taking on:  04/27/2014     nitroGLYCERIN 0.4 MG SL tablet  Commonly known as:  NITROSTAT  Place 1 tablet (0.4 mg total) under the tongue every 5 (five) minutes x 3 doses as needed for chest pain.     omeprazole 20 MG capsule  Commonly known as:  PRILOSEC  Take 20 mg by mouth every morning.        Disposition   The patient will be discharged in stable condition to home.  Follow-up Information   Follow up with [name], MD On 05/03/2014. (With Nicolasa Ducking NP @ 1:15 pm)    Specialty:   Cardiology   Contact information:   [address] [place] [address] [phone]         Duration of Discharge Encounter: Greater than 30 minutes including physician and PA time.  Byrd Hesselbach R PA-C 04/25/2014, 1:57 PM  Patient seen and examined with Cline Crock, PA-C. We discussed all aspects of the encounter. I agree with the assessment and plan as stated above. He is suitable for d/c.   Truman Hayward 3:47 PM

## 2014-04-25 NOTE — Telephone Encounter (Signed)
Attempted to contact pt regarding discharge from [facility] on 04/21/14.  No answer, no machine.

## 2014-04-25 NOTE — Progress Notes (Signed)
*  PRELIMINARY RESULTS* Vascular Ultrasound Lower extremity venous duplex has been completed.  Preliminary findings: no evidence of DVT.  Farrel Demark, RDMS, RVT  04/25/2014, 3:22 PM

## 2014-04-25 NOTE — Progress Notes (Signed)
Patient Name: [name] [name] Date of Encounter: 04/25/2014     Principal Problem:   NSTEMI (non-ST elevated myocardial infarction) Active Problems:   Morbid obesity   Hyperlipidemia   Hypertension   Diabetes mellitus without complication   Acute systolic CHF (congestive heart failure), NYHA class 3    SUBJECTIVE No chest pain or SOB overnight. Slept in the recliner because he could not get comfortable in the hospital bed. Feels better and seems eager to be discharged home. States that he feels ready to make a change in his weight and begin a healthier lifestyle.  CURRENT MEDS . amLODipine  5 mg Oral Daily  . aspirin EC  81 mg Oral Daily  . atorvastatin  80 mg Oral q1800  . carvedilol  6.25 mg Oral BID WC  . colchicine  0.6 mg Oral BID  . furosemide  40 mg Oral Daily  . gabapentin  300 mg Oral TID  . insulin aspart  0-15 Units Subcutaneous TID WC  . lisinopril  20 mg Oral Daily  . pantoprazole  40 mg Oral Q0600  . sodium chloride  3 mL Intravenous Q12H    OBJECTIVE  Filed Vitals:   04/24/14 0955 04/24/14 2123 04/25/14 0610 04/25/14 0701  BP: 121/72 118/57 123/109 119/54  Pulse: 68 62  61  Temp:  98.1 F (36.7 C) 99.4 F (37.4 C)   TempSrc:  Oral Oral   Resp: 16 18 18    Height:      Weight:   289 lb 0.4 oz (131.1 kg)   SpO2: 95% 95% 98%     Intake/Output Summary (Last 24 hours) at 04/25/14 1052 Last data filed at 04/25/14 0900  Gross per 24 hour  Intake 859.93 ml  Output   1001 ml  Net -141.07 ml   Filed Weights   04/23/14 0639 04/24/14 0634 04/25/14 0610  Weight: 291 lb 14.2 oz (132.4 kg) 290 lb 9.1 oz (131.8 kg) 289 lb 0.4 oz (131.1 kg)    PHYSICAL EXAM  General: Pleasant, NAD. Neuro: Alert and oriented X 3. Moves all extremities spontaneously. Psych: Normal affect. HEENT:  Normal  Neck: Supple without bruits or JVD. Lungs:  Resp regular and unlabored, CTA. Heart: RRR no s3, s4, or murmurs. Abdomen: Soft, non-tender, non-distended, BS + x 4.    Extremities: No clubbing, cyanosis or edema. DP/PT/Radials 2+ and equal bilaterally.  Accessory Clinical Findings  CBC  Recent Labs  04/24/14 0407 04/25/14 0302  WBC 7.1 7.8  HGB 14.3 14.2  HCT 45.0 44.2  MCV 91.5 89.3  PLT 180 164   Basic Metabolic Panel  Recent Labs  04/24/14 0407 04/25/14 0302  NA 141 142  K 3.9 4.0  CL 100 100  CO2 31 32  GLUCOSE 106* 124*  BUN 20 20  CREATININE 1.14 1.17  CALCIUM 9.1 8.9   Hemoglobin A1C  Recent Labs  04/24/14 0407  HGBA1C 6.6*   Fasting Lipid Panel  Recent Labs  04/24/14 0407  CHOL 88  HDL 23*  LDLCALC 42  TRIG 767  CHOLHDL 3.8    TELE  NSR with rate in 60s  Radiology/Studies  Ct Chest W Contrast 04/24/2014   CLINICAL DATA:  Recurrent chest pain and shortness of breath.  EXAM: CT CHEST WITH CONTRAST  TECHNIQUE: Multidetector CT imaging of the chest was performed during intravenous contrast administration.  CONTRAST:  85mL OMNIPAQUE IOHEXOL 300 MG/ML  SOLN  COMPARISON:  None.  FINDINGS: No pathologically enlarged mediastinal, hilar or axillary  lymph nodes. Three-vessel coronary artery calcification. Heart size normal. No pericardial effusion.  Image quality is somewhat degraded by respiratory motion. There is mild mosaic attenuation in the lungs. Minimal dependent atelectasis or scarring in the right lower lobe. No pleural fluid. Airway is unremarkable.  Incidental imaging of the upper abdomen shows the visualized portions of the liver, gallbladder and adrenal glands to be grossly unremarkable. A stone is seen in the right kidney. Probable renal vascular calcification in the left kidney. Visualized portions of the spleen, pancreas, stomach and bowel are otherwise grossly unremarkable. No upper abdominal adenopathy. No worrisome lytic or sclerotic lesions. Degenerative changes are seen in the spine.  IMPRESSION: 1. No acute findings to explain the patient's given symptoms. 2. Three-vessel coronary artery calcification. 3.  Mild mosaic attenuation the lungs is nonspecific and can be seen in the setting of small airways disease. 4. Right renal stone.   Electronically Signed   By: Leanna Battles M.D.   On: 04/24/2014 15:40   ECHO 04/24/14 Study Conclusions - Left ventricle: Systolic function was severely reduced. The estimated ejection fraction was in the range of 25% to 30%. - Aortic valve: No evidence of vegetation. - Mitral valve: There was mild to moderate regurgitation. - Left atrium: No evidence of thrombus in the atrial cavity or appendage. There was spontaneous echo contrast (&quot;smoke&quot;). - Right ventricle: Systolic function was moderately reduced. - Atrial septum: No defect or patent foramen ovale was identified.   ASSESSMENT AND PLAN  73 yo male w/ hx HTN, DM, HLD, was admitted 08/28 with shortness of breath. 3V dz at cath , EF 25-30% on TEE echo 8/31. Acute S-CHF, initially req BiPAP. TEE shows moderate MV regurgitation.   Acute systolic CHF: continue ACE-I, BB, amlodipine, Diuretic and statin. Continue Lasix. Improved SOB and no orthopnea, down over 3500ccs fluid since admission. Refer to the HF clinic. Possible high risk CABG- MVR. Follow up with scheduled appointment.   NSTEMI: cont ASA, BB, ACE. D/C with SL Nitro  Hyperlipidemia- lipids are good on lab draw this AM, cont statin   Hypertension- BP is well controlled, continue BB, ACE-I.   Diabetes Mellitus: A1C is 6.6, exposure to dye over 48 hours ago from cath procedure, OK to restart metformin, educate on eating with DM, follow up with PCP regarding elevated hemoglobin A1C.   Discharge home today with follow at the HF clinic, [facility] in [place] and CTCS with Dr. Morton Peters.  Signed,  Daine Floras, PA-S2   I have seen patient and made appropriate changes  Janetta Hora PA-C  Pager [phone]   Patient seen and examined. Agree with assessment and plan. Reviewed TEE findings. Breathing better on medical therapy. Sinus  rhythm. DC today with f/u as noted above.   Lennette Bihari, MD, [facility] 04/25/2014 12:04 PM

## 2014-04-25 NOTE — Discharge Instructions (Signed)
Heart Failure °Heart failure is a condition in which the heart has trouble pumping blood. This means your heart does not pump blood efficiently for your body to work well. In some cases of heart failure, fluid may back up into your lungs or you may have swelling (edema) in your lower legs. Heart failure is usually a long-term (chronic) condition. It is important for you to take good care of yourself and follow your health care provider's treatment plan. °CAUSES  °Some health conditions can cause heart failure. Those health conditions include: °· High blood pressure (hypertension). Hypertension causes the heart muscle to work harder than normal. When pressure in the blood vessels is high, the heart needs to pump (contract) with more force in order to circulate blood throughout the body. High blood pressure eventually causes the heart to become stiff and weak. °· Coronary artery disease (CAD). CAD is the buildup of cholesterol and fat (plaque) in the arteries of the heart. The blockage in the arteries deprives the heart muscle of oxygen and blood. This can cause chest pain and may lead to a heart attack. High blood pressure can also contribute to CAD. °· Heart attack (myocardial infarction). A heart attack occurs when one or more arteries in the heart become blocked. The loss of oxygen damages the muscle tissue of the heart. When this happens, part of the heart muscle dies. The injured tissue does not contract as well and weakens the heart's ability to pump blood. °· Abnormal heart valves. When the heart valves do not open and close properly, it can cause heart failure. This makes the heart muscle pump harder to keep the blood flowing. °· Heart muscle disease (cardiomyopathy or myocarditis). Heart muscle disease is damage to the heart muscle from a variety of causes. These can include drug or alcohol abuse, infections, or unknown reasons. These can increase the risk of heart failure. °· Lung disease. Lung disease  makes the heart work harder because the lungs do not work properly. This can cause a strain on the heart, leading it to fail. °· Diabetes. Diabetes increases the risk of heart failure. High blood sugar contributes to high fat (lipid) levels in the blood. Diabetes can also cause slow damage to tiny blood vessels that carry important nutrients to the heart muscle. When the heart does not get enough oxygen and food, it can cause the heart to become weak and stiff. This leads to a heart that does not contract efficiently. °· Other conditions can contribute to heart failure. These include abnormal heart rhythms, thyroid problems, and low blood counts (anemia). °Certain unhealthy behaviors can increase the risk of heart failure, including: °· Being overweight. °· Smoking or chewing tobacco. °· Eating foods high in fat and cholesterol. °· Abusing illicit drugs or alcohol. °· Lacking physical activity. °SYMPTOMS  °Heart failure symptoms may vary and can be hard to detect. Symptoms may include: °· Shortness of breath with activity, such as climbing stairs. °· Persistent cough. °· Swelling of the feet, ankles, legs, or abdomen. °· Unexplained weight gain. °· Difficulty breathing when lying flat (orthopnea). °· Waking from sleep because of the need to sit up and get more air. °· Rapid heartbeat. °· Fatigue and loss of energy. °· Feeling light-headed, dizzy, or close to fainting. °· Loss of appetite. °· Nausea. °· Increased urination during the night (nocturia). °DIAGNOSIS  °A diagnosis of heart failure is based on your history, symptoms, physical examination, and diagnostic tests. Diagnostic tests for heart failure may include: °·   Echocardiography. °· Electrocardiography. °· Chest X-ray. °· Blood tests. °· Exercise stress test. °· Cardiac angiography. °· Radionuclide scans. °TREATMENT  °Treatment is aimed at managing the symptoms of heart failure. Medicines, behavioral changes, or surgical intervention may be necessary to  treat heart failure. °· Medicines to help treat heart failure may include: °¨ Angiotensin-converting enzyme (ACE) inhibitors. This type of medicine blocks the effects of a blood protein called angiotensin-converting enzyme. ACE inhibitors relax (dilate) the blood vessels and help lower blood pressure. °¨ Angiotensin receptor blockers (ARBs). This type of medicine blocks the actions of a blood protein called angiotensin. Angiotensin receptor blockers dilate the blood vessels and help lower blood pressure. °¨ Water pills (diuretics). Diuretics cause the kidneys to remove salt and water from the blood. The extra fluid is removed through urination. This loss of extra fluid lowers the volume of blood the heart pumps. °¨ Beta blockers. These prevent the heart from beating too fast and improve heart muscle strength. °¨ Digitalis. This increases the force of the heartbeat. °· Healthy behavior changes include: °¨ Obtaining and maintaining a healthy weight. °¨ Stopping smoking or chewing tobacco. °¨ Eating heart-healthy foods. °¨ Limiting or avoiding alcohol. °¨ Stopping illicit drug use. °¨ Physical activity as directed by your health care provider. °· Surgical treatment for heart failure may include: °¨ A procedure to open blocked arteries, repair damaged heart valves, or remove damaged heart muscle tissue. °¨ A pacemaker to improve heart muscle function and control certain abnormal heart rhythms. °¨ An internal cardioverter defibrillator to treat certain serious abnormal heart rhythms. °¨ A left ventricular assist device (LVAD) to assist the pumping ability of the heart. °HOME CARE INSTRUCTIONS  °· Take medicines only as directed by your health care provider. Medicines are important in reducing the workload of your heart, slowing the progression of heart failure, and improving your symptoms. °¨ Do not stop taking your medicine unless directed by your health care provider. °¨ Do not skip any dose of medicine. °¨ Refill your  prescriptions before you run out of medicine. Your medicines are needed every day. °· Engage in moderate physical activity if directed by your health care provider. Moderate physical activity can benefit some people. The elderly and people with severe heart failure should consult with a health care provider for physical activity recommendations. °· Eat heart-healthy foods. Food choices should be free of trans fat and low in saturated fat, cholesterol, and salt (sodium). Healthy choices include fresh or frozen fruits and vegetables, fish, lean meats, legumes, fat-free or low-fat dairy products, and whole grain or high fiber foods. Talk to a dietitian to learn more about heart-healthy foods. °· Limit sodium if directed by your health care provider. Sodium restriction may reduce symptoms of heart failure in some people. Talk to a dietitian to learn more about heart-healthy seasonings. °· Use healthy cooking methods. Healthy cooking methods include roasting, grilling, broiling, baking, poaching, steaming, or stir-frying. Talk to a dietitian to learn more about healthy cooking methods. °· Limit fluids if directed by your health care provider. Fluid restriction may reduce symptoms of heart failure in some people. °· Weigh yourself every day. Daily weights are important in the early recognition of excess fluid. You should weigh yourself every morning after you urinate and before you eat breakfast. Wear the same amount of clothing each time you weigh yourself. Record your daily weight. Provide your health care provider with your weight record. °· Monitor and record your blood pressure if directed by your health care   provider.  Check your pulse if directed by your health care provider.  Lose weight if directed by your health care provider. Weight loss may reduce symptoms of heart failure in some people.  Stop smoking or chewing tobacco. Nicotine makes your heart work harder by causing your blood vessels to constrict.  Do not use nicotine gum or patches before talking to your health care provider.  Keep all follow-up visits as directed by your health care provider. This is important.  Limit alcohol intake to no more than 1 drink per day for nonpregnant women and 2 drinks per day for men. One drink equals 12 ounces of beer, 5 ounces of wine, or 1 ounces of hard liquor. Drinking more than that is harmful to your heart. Tell your health care provider if you drink alcohol several times a week. Talk with your health care provider about whether alcohol is safe for you. If your heart has already been damaged by alcohol or you have severe heart failure, drinking alcohol should be stopped completely.  Stop illicit drug use.  Stay up-to-date with immunizations. It is especially important to prevent respiratory infections through current pneumococcal and influenza immunizations.  Manage other health conditions such as hypertension, diabetes, thyroid disease, or abnormal heart rhythms as directed by your health care provider.  Learn to manage stress.  Plan rest periods when fatigued.  Learn strategies to manage high temperatures. If the weather is extremely hot:  Avoid vigorous physical activity.  Use air conditioning or fans or seek a cooler location.  Avoid caffeine and alcohol.  Wear loose-fitting, lightweight, and light-colored clothing.  Learn strategies to manage cold temperatures. If the weather is extremely cold:  Avoid vigorous physical activity.  Layer clothes.  Wear mittens or gloves, a hat, and a scarf when going outside.  Avoid alcohol.  Obtain ongoing education and support as needed.  Participate in or seek rehabilitation as needed to maintain or improve independence and quality of life. SEEK MEDICAL CARE IF:   Your weight increases by 03 lb/1.4 kg in 1 day or 05 lb/2.3 kg in a week.  You have increasing shortness of breath that is unusual for you.  You are unable to participate in  your usual physical activities.  You tire easily.  You cough more than normal, especially with physical activity.  You have any or more swelling in areas such as your hands, feet, ankles, or abdomen.  You are unable to sleep because it is hard to breathe.  You feel like your heart is beating fast (palpitations).  You become dizzy or light-headed upon standing up. SEEK IMMEDIATE MEDICAL CARE IF:   You have difficulty breathing.  There is a change in mental status such as decreased alertness or difficulty with concentration.  You have a pain or discomfort in your chest.  You have an episode of fainting (syncope). MAKE SURE YOU:   Understand these instructions.  Will watch your condition.  Will get help right away if you are not doing well or get worse. Document Released: 08/11/2005 Document Revised: 12/26/2013 Document Reviewed: 09/10/2012 [facility] Patient Information 2015 [place], [place]. This information is not intended to replace advice given to you by your health care provider. Make sure you discuss any questions you have with your health care provider.      PLEASE DO NOT RESUME YOU METFORMIN UNTIL Thursday 04/27/14. THEN RESUME AS NORMALLY.

## 2014-04-26 ENCOUNTER — Other Ambulatory Visit: Payer: Self-pay | Admitting: *Deleted

## 2014-04-26 DIAGNOSIS — I5082 Biventricular heart failure: Secondary | ICD-10-CM

## 2014-04-27 NOTE — Care Management Note (Signed)
    Page 1 of 1   04/27/2014     10:51:42 AM CARE MANAGEMENT NOTE 04/27/2014  Patient:  [name] [name], [name] [name]   Account Number:  [id number]  Date Initiated:  04/25/2014  Documentation initiated by:  Ames Hoban  Subjective/Objective Assessment:   Pt adm on 04/21/14 with NSTEMI, CHF, CAD.  PTA, pt resides at home and is independent; has supportive girlfriend.     Action/Plan:   Pt for dc home today.  No discharge needs identified.   Anticipated DC Date:  04/25/2014   Anticipated DC Plan:  HOME/SELF CARE      DC Planning Services  CM consult      Choice offered to / List presented to:             Status of service:  Completed, signed off Medicare Important Message given?  YES (If response is "NO", the following Medicare IM given date fields will be blank) Date Medicare IM given:  04/24/2014 Medicare IM given by:  Luane Rochon Date Additional Medicare IM given:   Additional Medicare IM given by:    Discharge Disposition:  HOME/SELF CARE  Per UR Regulation:  Reviewed for med. necessity/level of care/duration of stay  If discussed at Long Length of Stay Meetings, dates discussed:    Comments:

## 2014-05-03 ENCOUNTER — Ambulatory Visit (INDEPENDENT_AMBULATORY_CARE_PROVIDER_SITE_OTHER): Payer: Medicare PPO | Admitting: Nurse Practitioner

## 2014-05-03 ENCOUNTER — Encounter: Payer: Self-pay | Admitting: Nurse Practitioner

## 2014-05-03 VITALS — BP 98/60 | HR 72 | Ht 69.0 in | Wt 283.2 lb

## 2014-05-03 DIAGNOSIS — E785 Hyperlipidemia, unspecified: Secondary | ICD-10-CM

## 2014-05-03 DIAGNOSIS — I2589 Other forms of chronic ischemic heart disease: Secondary | ICD-10-CM

## 2014-05-03 DIAGNOSIS — I214 Non-ST elevation (NSTEMI) myocardial infarction: Secondary | ICD-10-CM

## 2014-05-03 DIAGNOSIS — I5022 Chronic systolic (congestive) heart failure: Secondary | ICD-10-CM

## 2014-05-03 DIAGNOSIS — I10 Essential (primary) hypertension: Secondary | ICD-10-CM

## 2014-05-03 DIAGNOSIS — R079 Chest pain, unspecified: Secondary | ICD-10-CM

## 2014-05-03 DIAGNOSIS — I2510 Atherosclerotic heart disease of native coronary artery without angina pectoris: Secondary | ICD-10-CM

## 2014-05-03 DIAGNOSIS — I255 Ischemic cardiomyopathy: Secondary | ICD-10-CM

## 2014-05-03 MED ORDER — SIMVASTATIN 40 MG PO TABS: 40.0000 mg | ORAL_TABLET | Freq: Every day | ORAL | Status: DC

## 2014-05-03 NOTE — Patient Instructions (Addendum)
Please take lab orders to [facility] for lab draw: BMET  Please follow up with Dr. Mariah Milling in 1 month

## 2014-05-03 NOTE — Progress Notes (Signed)
Patient Name: [name] [name] Date of Encounter: 05/03/2014  Primary Care Provider:  [name], MD Primary Cardiologist:  Concha Se, MD   Patient Profile  73 y/o male s/p recent NSTEMI, who presents for f/u.  Problem List   Past Medical History  Diagnosis Date  . Hypertension   . Hyperlipidemia   . Gout   . Diabetes mellitus without complication   . Nephrolithiasis   . Umbilical hernia   . Epigastric hernia   . Obesity   . CAD (coronary artery disease)     a. 03/2014 NSTEMI/Cath: LM nl, LAD 38m - hazy, D1 90p, D2 100, D3 min irregs, LCX 49m, OM2 95, OM3 90 small, RCA 53m-->pending further CABG eval.  . Chronic systolic CHF (congestive heart failure)     a. EF 25-30% by 2D ECHO 04/20/14. Severely dec global hypokinesis. mildly elevated RVSP  . AAA (abdominal aortic aneurysm)   . Ischemic cardiomyopathy     a. EF 25-30% 03/2014.  . Mitral regurgitation     a. 03/2014 TEE: mild to mod MR.   Past Surgical History  Procedure Laterality Date  . Rotator cuff repair Right 1996,1997,2000  . Carpal tunnel release Right   . Tee without cardioversion N/A 04/24/2014    Procedure: TRANSESOPHAGEAL ECHOCARDIOGRAM (TEE);  Surgeon: [name], MD;  Location[facility] ENDOSCOPY;  Service: Cardiovascular;  Laterality: N/A;  . Cardiac catheterization      [facility]    Allergies  No Known Allergies  HPI  73 y/o male with the above problem list.  He was recently admitted to [facility] with chest pain and ruled in for NSTEMI.  Echo showed severe LV dysfxn and cath revealed severe 3VD.  He was transferred to [facility] for further evaluation by thoracic surgery.  In the setting of ongoing volume overload and heart failure, he was felt to be a poor CABG candidate in the acute setting.  Recommendation was made for diuresis, weight loss, and transesophageal echocardiogram to better evaluate his mitral valve.  TEE showed mild to moderate mitral regurgitation.  It was felt that he would benefit from  outpatient advanced heart failure clinic evaluation and treatment prior to possible high-risk CABG with mitral valve replacement in the future.  This patient lives in [place], he did not want to have to travel to [place] for the advanced heart failure clinic and arranges were made for followup today.  He reports that since discharge, he has been doing exceptionally well.  His weight is down 7 pounds since his discharge and he no longer has any lower extremity edema.  He has been walking some without any chest pain or significant dyspnea on exertion.  He does tire after a few laps around [place] and will rest at that point.  He is interested in increasing his activity including cutting the hedges, mowing her yard, and going down hunting and I've explained to him that at this time, that activity it is too much activity.  He denies PND, orthopnea, dizziness, syncope, edema, or early satiety.  He hopes to lose 30 pounds by the end of the month but we discussed how that is unlikely to be a reasonable goal.  He has followup with Dr. Donata Clay on 9/30.  Home Medications  Prior to Admission medications   Medication Sig Start Date End Date Taking? Authorizing Provider  amLODipine (NORVASC) 5 MG tablet Take 5 mg by mouth daily.  12/13/13  Yes Historical Provider, MD  aspirin 81 MG tablet Take  81 mg by mouth every morning.    Yes Historical Provider, MD  carvedilol (COREG) 6.25 MG tablet Take 1 tablet (6.25 mg total) by mouth 2 (two) times daily with a meal. 04/25/14  Yes Janetta Hora, PA-C  colchicine 0.6 MG tablet Take 0.6 mg by mouth daily as needed (for gout flareup).    Yes Historical Provider, MD  cyanocobalamin 500 MCG tablet Take 500 mcg by mouth daily.   Yes Historical Provider, MD  ergocalciferol (VITAMIN D2) 50000 UNITS capsule Take 50,000 Units by mouth every 30 (thirty) days.   Yes Historical Provider, MD  furosemide (LASIX) 40 MG tablet Take 1 tablet (40 mg total) by mouth daily. 04/25/14   Yes Janetta Hora, PA-C  gabapentin (NEURONTIN) 300 MG capsule Take 300 mg by mouth 4 (four) times daily.  01/02/14  Yes Historical Provider, MD  lisinopril (PRINIVIL,ZESTRIL) 20 MG tablet Take 20 mg by mouth every morning.  12/20/13  Yes Historical Provider, MD  metFORMIN (GLUCOPHAGE) 1000 MG tablet Take 1 tablet (1,000 mg total) by mouth 2 (two) times daily with a meal. 04/27/14  Yes Janetta Hora, PA-C  Misc Natural Products (BLACK CHERRY CONCENTRATE PO) Take 1 capsule by mouth daily.   Yes Historical Provider, MD  nitroGLYCERIN (NITROSTAT) 0.4 MG SL tablet Place 1 tablet (0.4 mg total) under the tongue every 5 (five) minutes x 3 doses as needed for chest pain. 04/25/14  Yes Janetta Hora, PA-C  omeprazole (PRILOSEC) 20 MG capsule Take 20 mg by mouth every morning.    Yes Historical Provider, MD  potassium chloride (K-DUR) 10 MEQ tablet Take 10 mEq by mouth daily.   Yes Historical Provider, MD  simethicone (MYLICON) 125 MG chewable tablet Chew 125 mg by mouth daily.   Yes Historical Provider, MD  simvastatin (ZOCOR) 40 MG tablet Take 1 tablet (40 mg total) by mouth daily. 05/03/14  Yes Ok Anis, NP    Review of Systems  As above, he has been feeling well.  He denies chest pain, palpitations, dyspnea, pnd, orthopnea, n, v, dizziness, syncope, edema, weight gain, or early satiety.  All other systems reviewed and are otherwise negative except as noted above.  Physical Exam  Blood pressure 98/60, pulse 72, height  (1.753 m), weight 283 lb 4 oz (128.481 kg).  General: Pleasant, NAD Psych: Normal affect. Neuro: Alert and oriented X 3. Moves all extremities spontaneously. HEENT: Normal  Neck: Supple without bruits or JVD. Lungs:  Resp regular and unlabored, CTA. Heart: RRR no s3, s4, or murmurs. Abdomen: Soft, non-tender, non-distended, BS + x 4.  Extremities: No clubbing, cyanosis or edema. DP/PT/Radials 2+ and equal bilaterally.  Accessory Clinical Findings  ECG -  regular sinus rhythm, 72, no acute ST or T changes.  Assessment & Plan  1.  Non-ST segment elevation myocardial infarction, subsequent episode of care: The patient presents following recent non-STEMI catheterization revealing severe multi-vessel coronary artery disease.at the time of his hospitalization, he was felt to be a poor candidate for coronary artery bypass grafting in the setting of morbid obesity, and acute systolic heart failure.  His volume status is much improved and he is actively dieting and trying to lose weight.  We discussed diet and what would be considered reasonable activities at this time.  Though he feels well, he seems to forget that he has severe multivessel coronary artery disease.  That said, he has not had any chest pain with walking.  He remains on aspirin, beta  blocker ACE inhibitor, and statin therapy at this time.  He has nitroglycerin to be used as needed.  He has followup with thoracic surgery on September 30.  2.  Chronic systolic congestive heart failure/ischemic cardiopathy: Patient has severe biventricular heart failure though is well compensated today.  His weight is down 7 pounds since discharge and volume looks good on exam.  He remains on beta blocker and ACE inhibitor therapy however his blood pressure is too soft today to advance his regimen at all.  I asked him to follow his blood pressures at home over the next week and call in his results.  I would like to discontinue amlodipine and advance beta blocker.  As pressures are soft, he is not currently a candidate for an ARNI or spiro. F/U bmet today.  3.  HTN:  As above. bp's have soft today.  He will follow @ home an notify us so that we can adjust his meds accoridingly.  4.  HL:  On statin.  LDL 42 on 8/31.  5.  DM:  On metformin.  6.  Dispo:  He will call with blood pressure readings.  He has f/u with Dr. Donata Clay on 9/30.  Nicolasa Ducking, NP 05/03/2014, 5:44 PM

## 2014-05-04 ENCOUNTER — Encounter (HOSPITAL_COMMUNITY): Payer: Medicare PPO

## 2014-05-04 ENCOUNTER — Other Ambulatory Visit: Payer: Self-pay | Admitting: Cardiovascular Disease

## 2014-05-04 ENCOUNTER — Other Ambulatory Visit: Payer: Self-pay

## 2014-05-04 DIAGNOSIS — I214 Non-ST elevation (NSTEMI) myocardial infarction: Secondary | ICD-10-CM

## 2014-05-04 DIAGNOSIS — I5022 Chronic systolic (congestive) heart failure: Secondary | ICD-10-CM

## 2014-05-04 LAB — BASIC METABOLIC PANEL
ANION GAP: 4 — AB (ref 7–16)
BUN: 38 mg/dL — AB (ref 7–18)
CO2: 27 mmol/L (ref 21–32)
CREATININE: 1.55 mg/dL — AB (ref 0.60–1.30)
Calcium, Total: 9.1 mg/dL (ref 8.5–10.1)
Chloride: 103 mmol/L (ref 98–107)
GFR CALC AF AMER: 52 — AB
GFR CALC NON AF AMER: 45 — AB
Glucose: 118 mg/dL — ABNORMAL HIGH (ref 65–99)
Osmolality: 278 (ref 275–301)
POTASSIUM: 4.8 mmol/L (ref 3.5–5.1)
Sodium: 134 mmol/L — ABNORMAL LOW (ref 136–145)

## 2014-05-05 ENCOUNTER — Other Ambulatory Visit: Payer: Self-pay

## 2014-05-05 DIAGNOSIS — R609 Edema, unspecified: Secondary | ICD-10-CM

## 2014-05-09 ENCOUNTER — Ambulatory Visit (INDEPENDENT_AMBULATORY_CARE_PROVIDER_SITE_OTHER): Payer: Medicare PPO

## 2014-05-09 DIAGNOSIS — R609 Edema, unspecified: Secondary | ICD-10-CM

## 2014-05-10 ENCOUNTER — Other Ambulatory Visit: Payer: Self-pay

## 2014-05-10 DIAGNOSIS — R609 Edema, unspecified: Secondary | ICD-10-CM

## 2014-05-10 LAB — BASIC METABOLIC PANEL
BUN/Creatinine Ratio: 23 — ABNORMAL HIGH (ref 10–22)
BUN: 32 mg/dL — ABNORMAL HIGH (ref 8–27)
CO2: 23 mmol/L (ref 18–29)
Calcium: 9.9 mg/dL (ref 8.6–10.2)
Chloride: 97 mmol/L (ref 97–108)
Creatinine, Ser: 1.37 mg/dL — ABNORMAL HIGH (ref 0.76–1.27)
GFR, EST AFRICAN AMERICAN: 60 mL/min/{1.73_m2} (ref >59)
GFR, EST NON AFRICAN AMERICAN: 52 mL/min/{1.73_m2} — AB (ref >59)
Glucose: 110 mg/dL — ABNORMAL HIGH (ref 65–99)
Potassium: 5.5 mmol/L — ABNORMAL HIGH (ref 3.5–5.2)
Sodium: 137 mmol/L (ref 134–144)

## 2014-05-12 ENCOUNTER — Ambulatory Visit (HOSPITAL_COMMUNITY)
Admission: RE | Admit: 2014-05-12 | Discharge: 2014-05-12 | Disposition: A | Discharge status: Home / Self Care | Payer: Medicare PPO | Source: Ambulatory Visit | Attending: Cardiothoracic Surgery | Admitting: Cardiothoracic Surgery

## 2014-05-12 DIAGNOSIS — J4489 Other specified chronic obstructive pulmonary disease: Secondary | ICD-10-CM | POA: Insufficient documentation

## 2014-05-12 DIAGNOSIS — I509 Heart failure, unspecified: Secondary | ICD-10-CM | POA: Insufficient documentation

## 2014-05-12 DIAGNOSIS — I5082 Biventricular heart failure: Secondary | ICD-10-CM

## 2014-05-12 DIAGNOSIS — J449 Chronic obstructive pulmonary disease, unspecified: Secondary | ICD-10-CM | POA: Insufficient documentation

## 2014-05-12 DIAGNOSIS — Z87891 Personal history of nicotine dependence: Secondary | ICD-10-CM | POA: Insufficient documentation

## 2014-05-12 LAB — PULMONARY FUNCTION TEST
DL/VA % pred: 114 %
DL/VA: 5.39 ml/min/mmHg/L
DLCO cor % pred: 89 %
DLCO cor: 31.36 ml/min/mmHg
DLCO unc % pred: 88 %
DLCO unc: 31 ml/min/mmHg
FEF 25-75 Post: 1.79 L/sec
FEF 25-75 Pre: 1.66 L/sec
FEF2575-%Change-Post: 7 %
FEF2575-%Pred-Post: 68 %
FEF2575-%Pred-Pre: 63 %
FEV1-%Change-Post: 2 %
FEV1-%Pred-Post: 71 %
FEV1-%Pred-Pre: 69 %
FEV1-Post: 2.47 L
FEV1-Pre: 2.41 L
FEV1FVC-%Change-Post: -4 %
FEV1FVC-%Pred-Pre: 98 %
FEV6-%Change-Post: 7 %
FEV6-%Pred-Post: 80 %
FEV6-%Pred-Pre: 75 %
FEV6-Post: 3.6 L
FEV6-Pre: 3.35 L
FEV6FVC-%Pred-Post: 105 %
FEV6FVC-%Pred-Pre: 105 %
FVC-%Change-Post: 7 %
FVC-%Pred-Post: 76 %
FVC-%Pred-Pre: 70 %
FVC-Post: 3.6 L
FVC-Pre: 3.35 L
Post FEV1/FVC ratio: 69 %
Post FEV6/FVC ratio: 100 %
Pre FEV1/FVC ratio: 72 %
Pre FEV6/FVC Ratio: 100 %
RV % pred: 134 %
RV: 3.47 L
TLC % pred: 92 %
TLC: 6.89 L

## 2014-05-12 MED ORDER — ALBUTEROL SULFATE (2.5 MG/3ML) 0.083% IN NEBU
2.5000 mg | INHALATION_SOLUTION | Freq: Once | RESPIRATORY_TRACT | Status: AC
  Administered 2014-05-12: 2.5 mg via RESPIRATORY_TRACT

## 2014-05-15 ENCOUNTER — Encounter: Payer: Self-pay | Admitting: Cardiovascular Disease

## 2014-05-15 ENCOUNTER — Ambulatory Visit (INDEPENDENT_AMBULATORY_CARE_PROVIDER_SITE_OTHER): Payer: Medicare PPO | Admitting: *Deleted

## 2014-05-15 DIAGNOSIS — R609 Edema, unspecified: Secondary | ICD-10-CM

## 2014-05-16 LAB — BASIC METABOLIC PANEL
BUN/Creatinine Ratio: 32 — ABNORMAL HIGH (ref 10–22)
BUN: 44 mg/dL — ABNORMAL HIGH (ref 8–27)
CO2: 21 mmol/L (ref 18–29)
Calcium: 9.4 mg/dL (ref 8.6–10.2)
Chloride: 97 mmol/L (ref 97–108)
Creatinine, Ser: 1.38 mg/dL — ABNORMAL HIGH (ref 0.76–1.27)
GFR calc Af Amer: 59 mL/min/{1.73_m2} — ABNORMAL LOW (ref >59)
GFR calc non Af Amer: 51 mL/min/{1.73_m2} — ABNORMAL LOW (ref >59)
Glucose: 135 mg/dL — ABNORMAL HIGH (ref 65–99)
POTASSIUM: 5.4 mmol/L — AB (ref 3.5–5.2)
SODIUM: 136 mmol/L (ref 134–144)

## 2014-05-18 ENCOUNTER — Telehealth: Payer: Self-pay | Admitting: *Deleted

## 2014-05-18 DIAGNOSIS — I5021 Acute systolic (congestive) heart failure: Secondary | ICD-10-CM

## 2014-05-18 NOTE — Telephone Encounter (Signed)
error 

## 2014-05-18 NOTE — Telephone Encounter (Signed)
Message copied by Fransico Setters on Thu May 18, 2014  9:52 AM ------      Message from: Sondra Barges      Created: Tue May 16, 2014 12:23 PM       Please call the patient. K+ slightly improved. Please have him recheck a BMET around 05/25/14. ------

## 2014-05-23 ENCOUNTER — Other Ambulatory Visit: Payer: Self-pay | Admitting: Cardiothoracic Surgery

## 2014-05-23 DIAGNOSIS — I214 Non-ST elevation (NSTEMI) myocardial infarction: Secondary | ICD-10-CM

## 2014-05-24 ENCOUNTER — Other Ambulatory Visit: Payer: Self-pay

## 2014-05-24 ENCOUNTER — Ambulatory Visit
Admission: RE | Admit: 2014-05-24 | Discharge: 2014-05-24 | Disposition: A | Discharge status: Home / Self Care | Payer: Medicare PPO | Source: Ambulatory Visit | Attending: Cardiothoracic Surgery | Admitting: Cardiothoracic Surgery

## 2014-05-24 ENCOUNTER — Encounter: Payer: Self-pay | Admitting: Cardiothoracic Surgery

## 2014-05-24 ENCOUNTER — Institutional Professional Consult (permissible substitution) (INDEPENDENT_AMBULATORY_CARE_PROVIDER_SITE_OTHER): Payer: Medicare PPO | Admitting: Cardiothoracic Surgery

## 2014-05-24 VITALS — BP 92/53 | HR 76 | Ht 69.0 in | Wt 283.0 lb

## 2014-05-24 DIAGNOSIS — I340 Nonrheumatic mitral (valve) insufficiency: Secondary | ICD-10-CM

## 2014-05-24 DIAGNOSIS — I214 Non-ST elevation (NSTEMI) myocardial infarction: Secondary | ICD-10-CM

## 2014-05-24 DIAGNOSIS — I209 Angina pectoris, unspecified: Secondary | ICD-10-CM

## 2014-05-24 DIAGNOSIS — I519 Heart disease, unspecified: Secondary | ICD-10-CM

## 2014-05-24 DIAGNOSIS — I25119 Atherosclerotic heart disease of native coronary artery with unspecified angina pectoris: Secondary | ICD-10-CM

## 2014-05-24 DIAGNOSIS — I2510 Atherosclerotic heart disease of native coronary artery without angina pectoris: Secondary | ICD-10-CM

## 2014-05-24 IMAGING — CR DG CHEST 2V
2 series · 2 of 2 positions shown · non-contrast
Comparison: None.

CLINICAL DATA: Acute myocardial infarction.

EXAM:
CHEST  2 VIEW

[w chest pa]
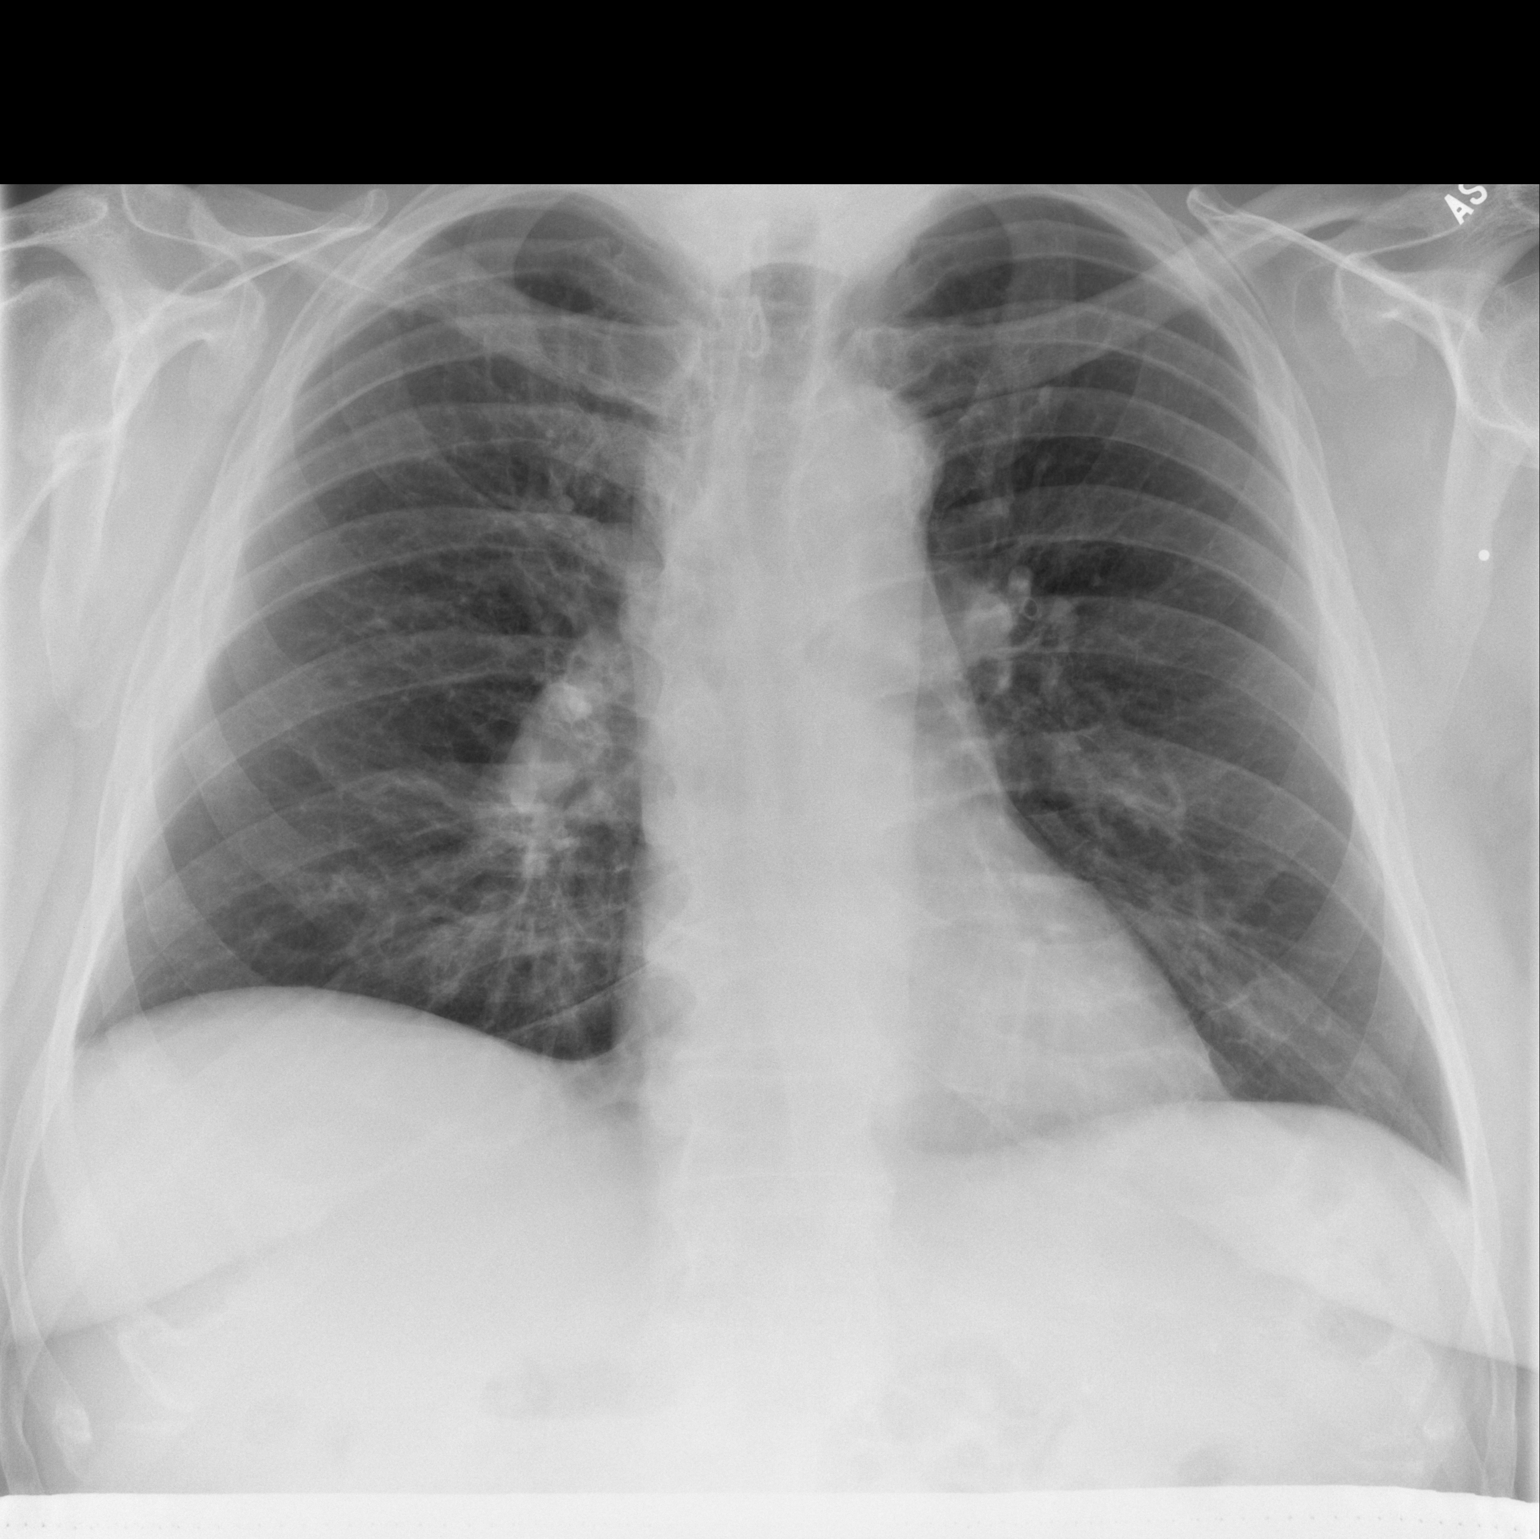

[w chest lat *]
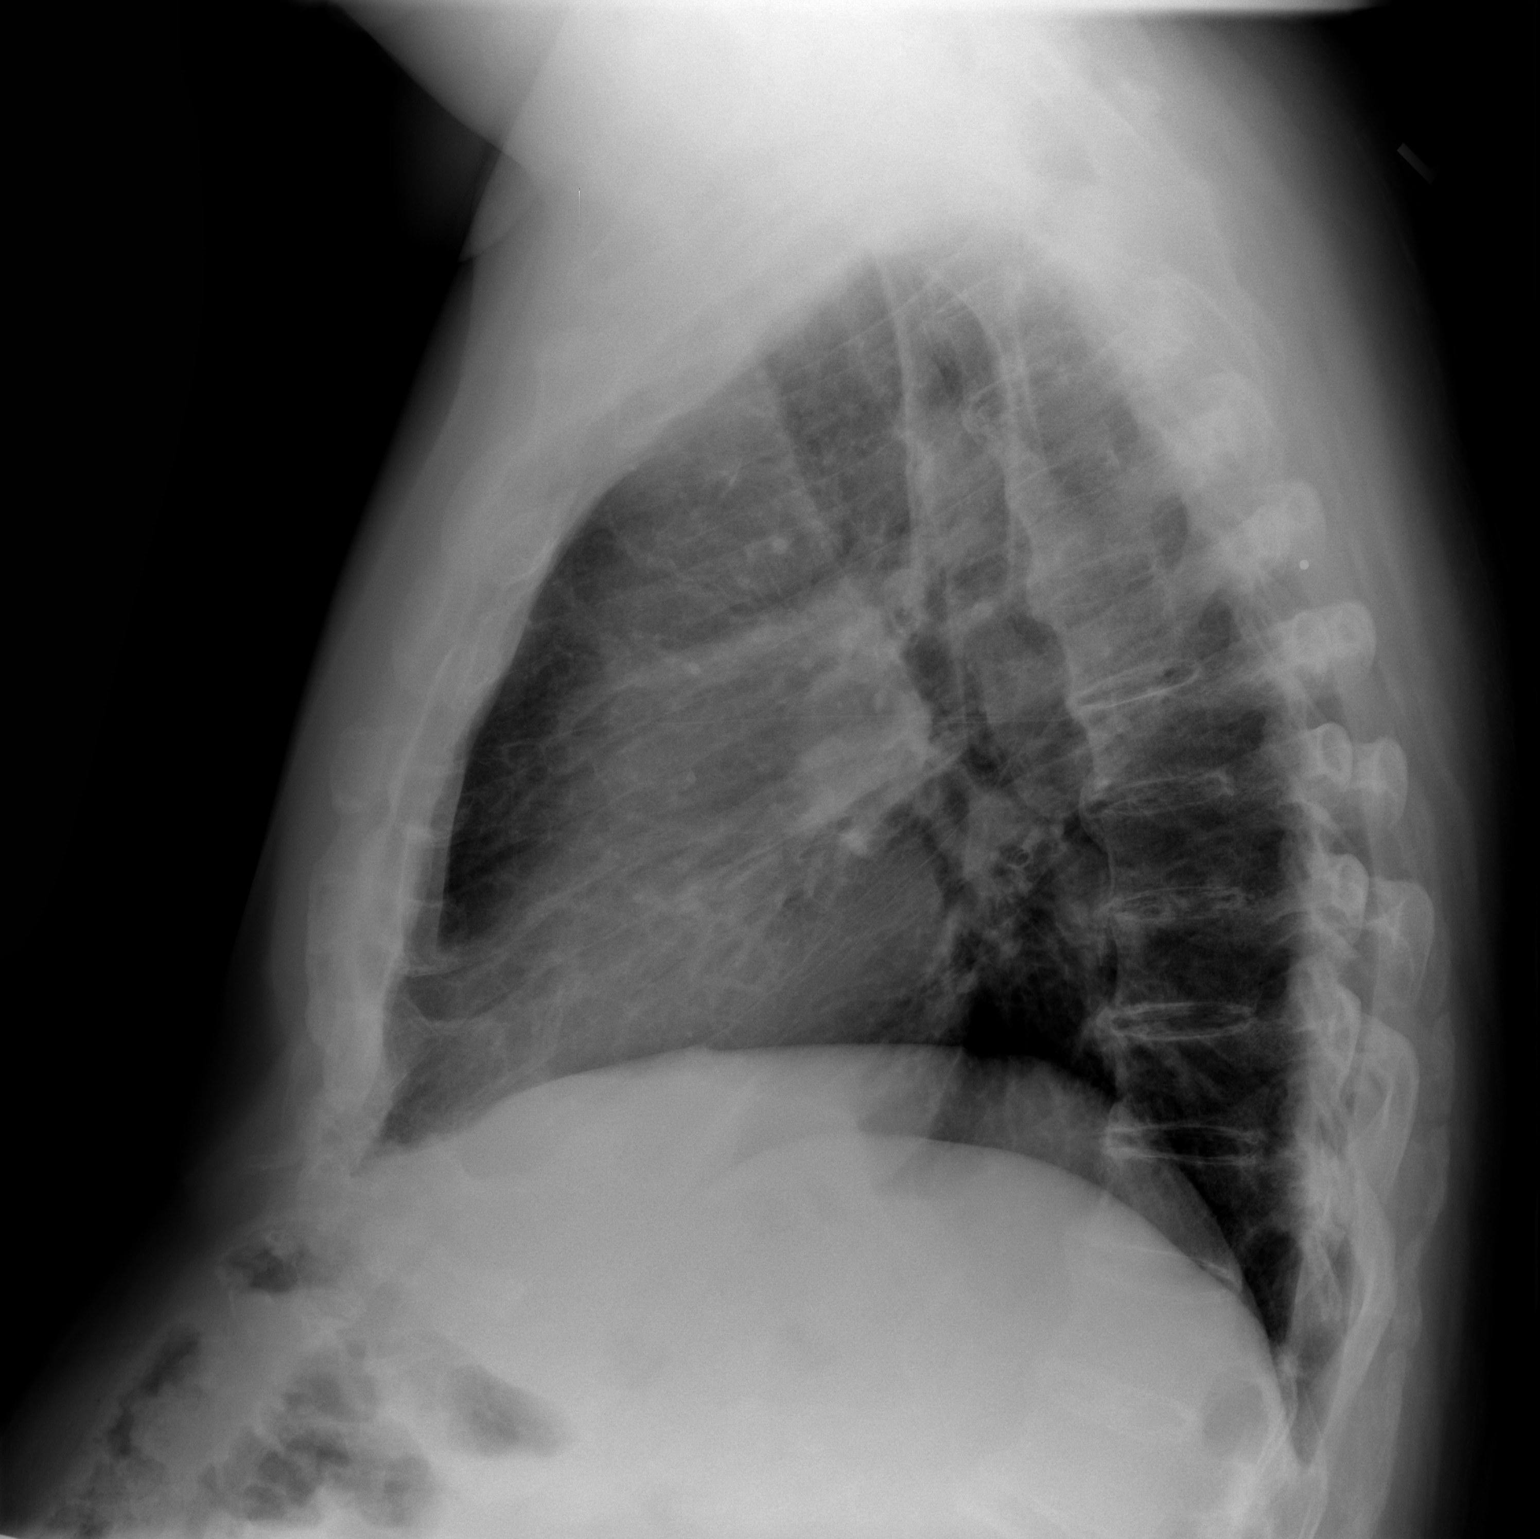

[2 of 2 positions shown; findings below may reference images not displayed]

FINDINGS: The heart size and mediastinal contours are within normal limits.
Both lungs are clear. No pneumothorax or pleural effusion is noted.
The visualized skeletal structures are unremarkable.
IMPRESSION: No acute cardiopulmonary abnormality seen.

## 2014-05-24 NOTE — Progress Notes (Signed)
PCP is Jerl MinaHEDRICK,JAMES, MD Referring Provider is Nahser, Deloris PingPhilip J, MD  Chief Complaint  Patient presents with  . NEW CARDIAC    F/U VISIT AFTER PFT'S    HPI: Patient returns for posthospitalization office visit. His active symptoms of heart failure have improved. He has been evaluated by his cardiologist and his medications and medical status has been optimized. He is able to walk with less shortness of breath. He denies angina. He has lost weight-10 pounds.  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 Hypertension   . Hyperlipidemia   . Gout   . Diabetes mellitus without complication   . Nephrolithiasis   . Umbilical hernia   . Epigastric hernia   . Obesity   . CAD (coronary artery disease)     a. 03/2014 NSTEMI/Cath: LM nl, LAD 2147m - hazy, D1 90p, D2 100, D3 min irregs, LCX 4748m, OM2 95, OM3 90 small, RCA 5234m-->pending further CABG eval.  . Chronic systolic CHF (congestive heart failure)     a. EF 25-30% by 2D ECHO 04/20/14. Severely dec global hypokinesis. mildly elevated RVSP  . AAA (abdominal aortic aneurysm)   . Ischemic cardiomyopathy     a. EF 25-30% 03/2014.  . Mitral regurgitation     a. 03/2014 TEE: mild to mod MR.    Past Surgical History  Procedure Laterality Date  . Rotator cuff repair Right 1996,1997,2000  . Carpal tunnel release Right   . Tee without cardioversion N/A 04/24/2014    Procedure: TRANSESOPHAGEAL ECHOCARDIOGRAM (TEE);  Surgeon: Vesta MixerPhilip J Nahser, MD;  [facility];  Service: Cardiovascular;  Laterality: N/A;  . Cardiac catheterization      [facility]    Family History  Problem Relation Age of Onset  . Colon  polyps Brother   . Lung cancer Brother   . Throat cancer Father   . Stroke Father     Social History History  Substance Use Topics  . Smoking status: Former Smoker -- 1.00 packs/day for 25 years    Types: Cigarettes  . Smokeless tobacco: Never Used  . Alcohol Use: No    Current Outpatient Prescriptions  Medication Sig Dispense Refill  . aspirin 81 MG tablet Take 81 mg by mouth every morning.       [name]. atorvastatin (LIPITOR) 80 MG tablet       . carvedilol (COREG) 6.25 MG tablet Take 1 tablet (6.25 mg total) by mouth 2 (two) times daily with a meal.  60 tablet  6  . colchicine 0.6 MG tablet Take 0.6 mg by mouth daily as needed (for gout flareup).       . cyanocobalamin 500 MCG tablet Take 500 mcg by mouth daily.      . furosemide (LASIX) 40 MG tablet Take 20 mg by mouth daily.      [name]. gabapentin (NEURONTIN) 300 MG capsule Take 300 mg by mouth 4 (four) times daily.       [name]. lisinopril (PRINIVIL,ZESTRIL) 20 MG tablet Take 20 mg by mouth every morning.       . metFORMIN (GLUCOPHAGE) 1000 MG tablet Take 1 tablet (1,000 mg total) by mouth 2 (two) times daily with a meal.  60 tablet  11  .  Misc Natural Products (BLACK CHERRY CONCENTRATE PO) Take 1 capsule by mouth daily.      [name] omeprazole (PRILOSEC) 20 MG capsule Take 20 mg by mouth every morning.       . simethicone (MYLICON) 125 MG chewable tablet Chew 125 mg by mouth daily.      . simvastatin (ZOCOR) 40 MG tablet Take 1 tablet (40 mg total) by mouth daily.  90 tablet  3  . ergocalciferol (VITAMIN D2) 50000 UNITS capsule Take 50,000 Units by mouth every 30 (thirty) days.      . nitroGLYCERIN (NITROSTAT) 0.4 MG SL tablet Place 1 tablet (0.4 mg total) under the tongue every 5 (five) minutes x 3 doses as needed for chest pain.  25 tablet  12   No current facility-administered medications for this visit.    No Known Allergies  Review of Systems Gen. improved since his discharge from the hospital month ago Was checked by his cardiology  office as an outpatient and found to be in optimize medical condition for combined CABG-mitral valve repair States his blood sugars have been fine. Denies any recent fever or upper respiratory infection No recent symptoms of gout   BP 92/53  Pulse 76  Ht 5\' 9"  (1.753 m)  Wt 283 lb (128.368 kg)  BMI 41.77 kg/m2  SpO2 94% Physical Exam Alert and comfortable HEENT normocephalic dentition good Lungs clear Heart rate regular, grade 2/6 systolic murmur Abdomen obese nontender Extremities warm well perfused minimal edema right greater than left ankle Neuro intact  I ambulated the patient 300 feet and his oxygen saturation 97% at rest dropped to 95% post exercise  Diagnostic Tests: Coronary and a gram, echocardiogram, chest CT scan and pre-CABG Dopplers performed during his hospitalization all reviewed  Impression: Patient has severe multivessel CAD with LV dysfunction and ischemic mitral regurgitation. He would benefit from surgical coronary revascularization with combined mitral valve repair-currently his medical condition is optimized so we will schedule the surgery for 2 weeks. He understands there is a 10% risk of death stroke renal failure major infection, or nursing home long-term dependence. however the risk would be significantly higher if he presented again in heart failure or shock and he may even not be an operable at that point.  Plan:Combined CABG-mitral valve repair with placement of an  impella 5.0 LVAD catheter October 13 at [facility] hospital

## 2014-05-25 ENCOUNTER — Other Ambulatory Visit (INDEPENDENT_AMBULATORY_CARE_PROVIDER_SITE_OTHER): Payer: Medicare PPO

## 2014-05-25 DIAGNOSIS — I5021 Acute systolic (congestive) heart failure: Secondary | ICD-10-CM

## 2014-05-26 LAB — BASIC METABOLIC PANEL
BUN/Creatinine Ratio: 24 — ABNORMAL HIGH (ref 10–22)
BUN: 34 mg/dL — ABNORMAL HIGH (ref 8–27)
CALCIUM: 9.2 mg/dL (ref 8.6–10.2)
CO2: 24 mmol/L (ref 18–29)
Chloride: 97 mmol/L (ref 97–108)
Creatinine, Ser: 1.41 mg/dL — ABNORMAL HIGH (ref 0.76–1.27)
GFR calc Af Amer: 58 mL/min/{1.73_m2} — ABNORMAL LOW (ref >59)
GFR calc non Af Amer: 50 mL/min/{1.73_m2} — ABNORMAL LOW (ref >59)
Glucose: 122 mg/dL — ABNORMAL HIGH (ref 65–99)
POTASSIUM: 5.4 mmol/L — AB (ref 3.5–5.2)
Sodium: 136 mmol/L (ref 134–144)

## 2014-05-30 ENCOUNTER — Encounter (HOSPITAL_COMMUNITY): Payer: Self-pay | Admitting: Pharmacy Technician

## 2014-06-01 ENCOUNTER — Encounter: Payer: Self-pay | Admitting: *Deleted

## 2014-06-02 ENCOUNTER — Encounter (HOSPITAL_COMMUNITY)
Admission: RE | Admit: 2014-06-02 | Discharge: 2014-06-02 | Disposition: A | Discharge status: Home / Self Care | Payer: Medicare PPO | Source: Ambulatory Visit | Attending: Cardiothoracic Surgery | Admitting: Cardiothoracic Surgery

## 2014-06-02 ENCOUNTER — Ambulatory Visit: Payer: Medicare PPO | Admitting: Cardiovascular Disease

## 2014-06-02 ENCOUNTER — Other Ambulatory Visit: Payer: Self-pay

## 2014-06-02 ENCOUNTER — Ambulatory Visit (HOSPITAL_COMMUNITY)
Admission: RE | Admit: 2014-06-02 | Discharge: 2014-06-02 | Disposition: A | Discharge status: Home / Self Care | Payer: Medicare PPO | Source: Ambulatory Visit | Attending: Family Medicine | Admitting: Family Medicine

## 2014-06-02 ENCOUNTER — Encounter (HOSPITAL_COMMUNITY): Payer: Self-pay

## 2014-06-02 VITALS — BP 110/48 | HR 80 | Temp 98.1°F | Resp 20 | Ht 72.0 in | Wt 283.4 lb

## 2014-06-02 DIAGNOSIS — I349 Nonrheumatic mitral valve disorder, unspecified: Secondary | ICD-10-CM | POA: Diagnosis not present

## 2014-06-02 DIAGNOSIS — E785 Hyperlipidemia, unspecified: Secondary | ICD-10-CM | POA: Diagnosis not present

## 2014-06-02 DIAGNOSIS — I519 Heart disease, unspecified: Secondary | ICD-10-CM

## 2014-06-02 DIAGNOSIS — E119 Type 2 diabetes mellitus without complications: Secondary | ICD-10-CM | POA: Diagnosis not present

## 2014-06-02 DIAGNOSIS — I340 Nonrheumatic mitral (valve) insufficiency: Secondary | ICD-10-CM

## 2014-06-02 DIAGNOSIS — I10 Essential (primary) hypertension: Secondary | ICD-10-CM | POA: Insufficient documentation

## 2014-06-02 DIAGNOSIS — I2510 Atherosclerotic heart disease of native coronary artery without angina pectoris: Secondary | ICD-10-CM

## 2014-06-02 DIAGNOSIS — I5022 Chronic systolic (congestive) heart failure: Secondary | ICD-10-CM

## 2014-06-02 HISTORY — DX: Myoneural disorder, unspecified: G70.9

## 2014-06-02 HISTORY — DX: Unspecified osteoarthritis, unspecified site: M19.90

## 2014-06-02 LAB — APTT: aPTT: 41 seconds — ABNORMAL HIGH (ref 24–37)

## 2014-06-02 LAB — BLOOD GAS, ARTERIAL
Acid-Base Excess: 0.7 mmol/L (ref 0.0–2.0)
Bicarbonate: 25.1 mEq/L — ABNORMAL HIGH (ref 20.0–24.0)
Drawn by: 421801
FIO2: 0.21 %
O2 Saturation: 95.2 %
Patient temperature: 98.6
TCO2: 26.4 mmol/L (ref 0–100)
pCO2 arterial: 42.4 mmHg (ref 35.0–45.0)
pH, Arterial: 7.389 (ref 7.350–7.450)
pO2, Arterial: 75.4 mmHg — ABNORMAL LOW (ref 80.0–100.0)

## 2014-06-02 LAB — URINALYSIS, ROUTINE W REFLEX MICROSCOPIC
Bilirubin Urine: NEGATIVE
Glucose, UA: NEGATIVE mg/dL
Hgb urine dipstick: NEGATIVE
Ketones, ur: NEGATIVE mg/dL
Leukocytes, UA: NEGATIVE
Nitrite: NEGATIVE
Protein, ur: NEGATIVE mg/dL
Specific Gravity, Urine: 1.02 (ref 1.005–1.030)
Urobilinogen, UA: 0.2 mg/dL (ref 0.0–1.0)
pH: 5 (ref 5.0–8.0)

## 2014-06-02 LAB — COMPREHENSIVE METABOLIC PANEL
ALT: 19 U/L (ref 0–53)
AST: 20 U/L (ref 0–37)
Albumin: 3.6 g/dL (ref 3.5–5.2)
Alkaline Phosphatase: 113 U/L (ref 39–117)
Anion gap: 15 (ref 5–15)
BUN: 27 mg/dL — ABNORMAL HIGH (ref 6–23)
CO2: 20 mEq/L (ref 19–32)
Calcium: 9.6 mg/dL (ref 8.4–10.5)
Chloride: 101 mEq/L (ref 96–112)
Creatinine, Ser: 1.12 mg/dL (ref 0.50–1.35)
GFR calc Af Amer: 74 mL/min — ABNORMAL LOW (ref >90)
GFR calc non Af Amer: 64 mL/min — ABNORMAL LOW (ref >90)
Glucose, Bld: 108 mg/dL — ABNORMAL HIGH (ref 70–99)
Potassium: 5.2 mEq/L (ref 3.7–5.3)
Sodium: 136 mEq/L — ABNORMAL LOW (ref 137–147)
Total Bilirubin: 0.3 mg/dL (ref 0.3–1.2)
Total Protein: 7.2 g/dL (ref 6.0–8.3)

## 2014-06-02 LAB — PROTIME-INR
INR: 1.24 (ref 0.00–1.49)
Prothrombin Time: 15.6 seconds — ABNORMAL HIGH (ref 11.6–15.2)

## 2014-06-02 LAB — CBC
HCT: 42.4 % (ref 39.0–52.0)
Hemoglobin: 14.3 g/dL (ref 13.0–17.0)
MCH: 29.5 pg (ref 26.0–34.0)
MCHC: 33.7 g/dL (ref 30.0–36.0)
MCV: 87.6 fL (ref 78.0–100.0)
Platelets: 165 10*3/uL (ref 150–400)
RBC: 4.84 MIL/uL (ref 4.22–5.81)
RDW: 12.8 % (ref 11.5–15.5)
WBC: 12 10*3/uL — ABNORMAL HIGH (ref 4.0–10.5)

## 2014-06-02 LAB — SURGICAL PCR SCREEN
MRSA, PCR: NEGATIVE
Staphylococcus aureus: NEGATIVE

## 2014-06-02 LAB — ABO/RH: ABO/RH(D): O POS

## 2014-06-02 MED ORDER — PERFLUTREN LIPID MICROSPHERE
1.0000 mL | INTRAVENOUS | Status: AC | PRN
  Administered 2014-06-02: 3 mL via INTRAVENOUS
  Filled 2014-06-02: qty 10

## 2014-06-02 MED ORDER — PERFLUTREN LIPID MICROSPHERE
INTRAVENOUS | Status: AC
  Filled 2014-06-02: qty 10

## 2014-06-02 NOTE — Progress Notes (Signed)
06/02/14 1410  OBSTRUCTIVE SLEEP APNEA  Have you ever been diagnosed with sleep apnea through a sleep study? No  Do you snore loudly (loud enough to be heard through closed doors)?  0  Do you often feel tired, fatigued, or sleepy during the daytime? 1  Has anyone observed you stop breathing during your sleep? 0  Do you have, or are you being treated for high blood pressure? 1  BMI more than 35 kg/m2? 1  Age over 71 years old? 1  Gender: 1  Obstructive Sleep Apnea Score 5

## 2014-06-02 NOTE — Progress Notes (Signed)
  Echocardiogram 2D Echocardiogram has been performed.  Georgian Co 06/02/2014, 4:37 PM

## 2014-06-02 NOTE — Pre-Procedure Instructions (Signed)
MINTER BARGAS  06/02/2014   Your procedure is scheduled on:  Tuesday  06/06/14  Report to [facility] Admitting at 530 AM.  Call this number if you have problems the morning of surgery: [phone]   Remember:   Do not eat food or drink liquids after midnight.   Take these medicines the morning of surgery with A SIP OF WATER:  CARVEDILOL (COREG) , GABAPENTIN, OMEPRAZOLE   Do not wear jewelry, make-up or nail polish.  Do not wear lotions, powders, or perfumes. You may wear deodorant.  Do not shave 48 hours prior to surgery. Men may shave face and neck.  Do not bring valuables to the hospital.  [facility] is not responsible                  for any belongings or valuables.               Contacts, dentures or bridgework may not be worn into surgery.  Leave suitcase in the car. After surgery it may be brought to your room.  For patients admitted to the hospital, discharge time is determined by your                treatment team.               Patients discharged the day of surgery will not be allowed to drive  home.  Name and phone number of your driver:  Special Instructions:  Special Instructions: [place] - Preparing for Surgery  Before surgery, you can play an important role.  Because skin is not sterile, your skin needs to be as free of germs as possible.  You can reduce the number of germs on you skin by washing with CHG (chlorahexidine gluconate) soap before surgery.  CHG is an antiseptic cleaner which kills germs and bonds with the skin to continue killing germs even after washing.  Please DO NOT use if you have an allergy to CHG or antibacterial soaps.  If your skin becomes reddened/irritated stop using the CHG and inform your nurse when you arrive at Short Stay.  Do not shave (including legs and underarms) for at least 48 hours prior to the first CHG shower.  You may shave your face.  Please follow these instructions carefully:   1.  Shower with CHG Soap  the night before surgery and the morning of Surgery.  2.  If you choose to wash your hair, wash your hair first as usual with your normal shampoo.  3.  After you shampoo, rinse your hair and body thoroughly to remove the Shampoo.  4.  Use CHG as you would any other liquid soap. You can apply chg directly to the skin and wash gently with scrungie or a clean washcloth.  5.  Apply the CHG Soap to your body ONLY FROM THE NECK DOWN.  Do not use on open wounds or open sores.  Avoid contact with your eyes, ears, mouth and genitals (private parts).  Wash genitals (private parts with your normal soap.  6.  Wash thoroughly, paying special attention to the area where your surgery will be performed.  7.  Thoroughly rinse your body with warm water from the neck down.  8.  DO NOT shower/wash with your normal soap after using and rinsing off the CHG Soap.  9.  Pat yourself dry with a clean towel.            10.  Wear clean  pajamas.            11.  Place clean sheets on your bed the night of your first shower and do not sleep with pets.  Day of Surgery  Do not apply any lotions/deodorants the morning of surgery.  Please wear clean clothes to the hospital/surgery center.   Please read over the following fact sheets that you were given: Pain Booklet, Coughing and Deep Breathing, Blood Transfusion Information, MRSA Information and Surgical Site Infection Prevention

## 2014-06-03 LAB — HEMOGLOBIN A1C
Hgb A1c MFr Bld: 6.5 % — ABNORMAL HIGH (ref <5.7)
Mean Plasma Glucose: 140 mg/dL — ABNORMAL HIGH (ref <117)

## 2014-06-05 ENCOUNTER — Ambulatory Visit: Payer: Medicare PPO | Admitting: Cardiovascular Disease

## 2014-06-05 ENCOUNTER — Encounter (HOSPITAL_COMMUNITY): Payer: Self-pay | Admitting: Anesthesiology

## 2014-06-05 MED ORDER — POTASSIUM CHLORIDE 2 MEQ/ML IV SOLN
80.0000 meq | INTRAVENOUS | Status: DC
  Filled 2014-06-05: qty 40

## 2014-06-05 MED ORDER — CEFUROXIME SODIUM 1.5 G IJ SOLR
1.5000 g | INTRAMUSCULAR | Status: AC
  Administered 2014-06-06: 1.5 g via INTRAVENOUS
  Administered 2014-06-06: .75 g via INTRAVENOUS
  Filled 2014-06-05: qty 1.5

## 2014-06-05 MED ORDER — DOPAMINE-DEXTROSE 3.2-5 MG/ML-% IV SOLN
2.0000 ug/kg/min | INTRAVENOUS | Status: DC
  Filled 2014-06-05: qty 250

## 2014-06-05 MED ORDER — MAGNESIUM SULFATE 50 % IJ SOLN
40.0000 meq | INTRAMUSCULAR | Status: DC
Start: 2014-06-06 — End: 2014-06-06
  Filled 2014-06-05: qty 10

## 2014-06-05 MED ORDER — METOPROLOL TARTRATE 12.5 MG HALF TABLET: 12.5000 mg | ORAL_TABLET | Freq: Once | ORAL | Status: DC

## 2014-06-05 MED ORDER — DEXMEDETOMIDINE HCL IN NACL 400 MCG/100ML IV SOLN
0.1000 ug/kg/h | INTRAVENOUS | Status: AC
  Administered 2014-06-06: 0.3 ug/kg/h via INTRAVENOUS
  Filled 2014-06-05: qty 100

## 2014-06-05 MED ORDER — DEXTROSE 5 % IV SOLN
750.0000 mg | INTRAVENOUS | Status: DC
Start: 2014-06-06 — End: 2014-06-06
  Filled 2014-06-05: qty 750

## 2014-06-05 MED ORDER — SODIUM CHLORIDE 0.9 % IV SOLN
INTRAVENOUS | Status: AC
  Administered 2014-06-06: 70 mL/h via INTRAVENOUS
  Filled 2014-06-05: qty 40

## 2014-06-05 MED ORDER — SODIUM CHLORIDE 0.9 % IV SOLN
INTRAVENOUS | Status: DC
  Filled 2014-06-05: qty 30

## 2014-06-05 MED ORDER — EPINEPHRINE HCL 1 MG/ML IJ SOLN
0.5000 ug/min | INTRAVENOUS | Status: DC
  Filled 2014-06-05: qty 4

## 2014-06-05 MED ORDER — SODIUM CHLORIDE 0.9 % IV SOLN
1250.0000 mg | INTRAVENOUS | Status: AC
  Administered 2014-06-06: 1250 mg via INTRAVENOUS
  Filled 2014-06-05 (×2): qty 1250

## 2014-06-05 MED ORDER — NITROGLYCERIN IN D5W 200-5 MCG/ML-% IV SOLN
2.0000 ug/min | INTRAVENOUS | Status: DC
  Filled 2014-06-05: qty 250

## 2014-06-05 MED ORDER — PHENYLEPHRINE HCL 10 MG/ML IJ SOLN
30.0000 ug/min | INTRAVENOUS | Status: DC
  Filled 2014-06-05: qty 2

## 2014-06-05 MED ORDER — SODIUM CHLORIDE 0.9 % IV SOLN
INTRAVENOUS | Status: DC
  Filled 2014-06-05: qty 2.5

## 2014-06-05 MED ORDER — PLASMA-LYTE 148 IV SOLN
INTRAVENOUS | Status: AC
  Administered 2014-06-06: 09:00:00
  Filled 2014-06-05: qty 2.5

## 2014-06-05 NOTE — Progress Notes (Signed)
Anesthesia Chart Review:  Patient is a 73 year old male scheduled for CABG, MV repair on 06/06/14 by Dr. Donata Clay. 05/24/14 office note from Dr. Donata Clay also listed placement of an impella 5.0 LVAD catheter. (Follow-up echo from Friday showed EF improved to 55-60% and only trivial MR.)  History includes severe 3V CAD, NSTEMI 04/21/14 with acute systolic CHF 04/21/14, ischemic CM, mild-moderate mitral regurgitation, AAA (3.3 cm 11/29/13), DM2 with neuropathy, HTN, HLD, nephrolithiasis, former smoker. BMI is consistent with obesity. OSA screening score is 5.  PCP is Dr. [name] [facility]). Cardiologist is Dr. Mariah Milling.   Follow-up echo was done on 06/02/14 (see Results Review tab) and impression showed:  Technically difficult study. Definity contrast administered. Compared to the prior TEE in 03/2014, the EF has markedly improved from 25-30% up to 55-60%. 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 Diastolic dysfunction with normal LV filling pressure. I do not appreciate any signficant mitral valve pathology in this study - there is only trivial regurgitation. The MV annulus is not dilated. Study was ordered for pre-op mitral valve replacement? (Dr. Zoila Shutter)  Cath on 04/21/14 [facility]) showed: 80% mid LAD, 90% D1, 100% D2, 95% mid CX, 95% OM2, 90% OM3, 70% mid RCA.   Carotid duplex on 04/24/14 showed: Bilateral: 1-39% ICA stenosis. Vertebral artery flow is antegrade. ABI is within normal limits with abnormal Doppler waveforms. Right: ICA/CCA ratio is 1.42. Left: ICA/CCA ratio is 1.31.   Retroperitoneal ultrasound from 11/29/13 showed: 2.9 distal AA AP diameter. 3.3 distal AA transverse diameter.   05/24/14 CXR showed: No acute cardiopulmonary abnormality seen.  Preoperative EKG, labs. and PFTs noted.    As of 05/24/14, Dr. Donata Clay though patient's medical condition was optimized following his late 03/2014 hospitalization. Anticipate that he can proceed as planned if no acute changes.    Velna Ochs [facility] Short Stay Center/Anesthesiology Phone [phone] 06/05/2014 10:41 AM

## 2014-06-06 ENCOUNTER — Inpatient Hospital Stay (HOSPITAL_COMMUNITY): Payer: Medicare PPO

## 2014-06-06 ENCOUNTER — Inpatient Hospital Stay (HOSPITAL_COMMUNITY): Payer: Medicare PPO | Admitting: Anesthesiology

## 2014-06-06 ENCOUNTER — Encounter (HOSPITAL_COMMUNITY)
Admission: RE | Disposition: A | Discharge status: Skilled Nursing Facility | Payer: Medicare PPO | Source: Ambulatory Visit | Attending: Cardiothoracic Surgery

## 2014-06-06 ENCOUNTER — Encounter (HOSPITAL_COMMUNITY): Payer: Self-pay | Admitting: *Deleted

## 2014-06-06 ENCOUNTER — Encounter (HOSPITAL_COMMUNITY): Payer: Medicare PPO | Admitting: Vascular Surgery

## 2014-06-06 ENCOUNTER — Inpatient Hospital Stay (HOSPITAL_COMMUNITY)
Admission: RE | Admit: 2014-06-06 | Discharge: 2014-06-14 | DRG: 236 | Disposition: A | Discharge status: Skilled Nursing Facility | Payer: Medicare PPO | Source: Ambulatory Visit | Attending: Cardiothoracic Surgery | Admitting: Cardiothoracic Surgery

## 2014-06-06 DIAGNOSIS — I493 Ventricular premature depolarization: Secondary | ICD-10-CM | POA: Diagnosis not present

## 2014-06-06 DIAGNOSIS — I10 Essential (primary) hypertension: Secondary | ICD-10-CM | POA: Diagnosis present

## 2014-06-06 DIAGNOSIS — I2510 Atherosclerotic heart disease of native coronary artery without angina pectoris: Principal | ICD-10-CM | POA: Diagnosis present

## 2014-06-06 DIAGNOSIS — D62 Acute posthemorrhagic anemia: Secondary | ICD-10-CM | POA: Diagnosis not present

## 2014-06-06 DIAGNOSIS — Z951 Presence of aortocoronary bypass graft: Secondary | ICD-10-CM

## 2014-06-06 DIAGNOSIS — I5020 Unspecified systolic (congestive) heart failure: Secondary | ICD-10-CM | POA: Diagnosis present

## 2014-06-06 DIAGNOSIS — E669 Obesity, unspecified: Secondary | ICD-10-CM | POA: Diagnosis present

## 2014-06-06 DIAGNOSIS — I739 Peripheral vascular disease, unspecified: Secondary | ICD-10-CM | POA: Diagnosis present

## 2014-06-06 DIAGNOSIS — E785 Hyperlipidemia, unspecified: Secondary | ICD-10-CM | POA: Diagnosis present

## 2014-06-06 DIAGNOSIS — Z87891 Personal history of nicotine dependence: Secondary | ICD-10-CM

## 2014-06-06 DIAGNOSIS — Z6841 Body Mass Index (BMI) 40.0 and over, adult: Secondary | ICD-10-CM

## 2014-06-06 DIAGNOSIS — N179 Acute kidney failure, unspecified: Secondary | ICD-10-CM | POA: Diagnosis not present

## 2014-06-06 DIAGNOSIS — I4891 Unspecified atrial fibrillation: Secondary | ICD-10-CM | POA: Diagnosis not present

## 2014-06-06 DIAGNOSIS — I714 Abdominal aortic aneurysm, without rupture: Secondary | ICD-10-CM | POA: Diagnosis present

## 2014-06-06 DIAGNOSIS — I519 Heart disease, unspecified: Secondary | ICD-10-CM

## 2014-06-06 DIAGNOSIS — E1140 Type 2 diabetes mellitus with diabetic neuropathy, unspecified: Secondary | ICD-10-CM | POA: Diagnosis present

## 2014-06-06 DIAGNOSIS — I252 Old myocardial infarction: Secondary | ICD-10-CM

## 2014-06-06 DIAGNOSIS — K5900 Constipation, unspecified: Secondary | ICD-10-CM | POA: Diagnosis not present

## 2014-06-06 DIAGNOSIS — I340 Nonrheumatic mitral (valve) insufficiency: Secondary | ICD-10-CM

## 2014-06-06 HISTORY — PX: INTRAOPERATIVE TRANSESOPHAGEAL ECHOCARDIOGRAM: SHX5062

## 2014-06-06 HISTORY — PX: CORONARY ARTERY BYPASS GRAFT: SHX141

## 2014-06-06 LAB — POCT I-STAT 3, ART BLOOD GAS (G3+)
Acid-Base Excess: 3 mmol/L — ABNORMAL HIGH (ref 0.0–2.0)
Acid-base deficit: 2 mmol/L (ref 0.0–2.0)
Acid-base deficit: 1 mmol/L (ref 0.0–2.0)
Bicarbonate: 28.7 mEq/L — ABNORMAL HIGH (ref 20.0–24.0)
Bicarbonate: 26 mEq/L — ABNORMAL HIGH (ref 20.0–24.0)
Bicarbonate: 24.6 mEq/L — ABNORMAL HIGH (ref 20.0–24.0)
Bicarbonate: 24.7 mEq/L — ABNORMAL HIGH (ref 20.0–24.0)
O2 Saturation: 100 %
O2 Saturation: 99 %
O2 Saturation: 86 %
O2 Saturation: 92 %
Patient temperature: 36.5
Patient temperature: 37.1
TCO2: 30 mmol/L (ref 0–100)
TCO2: 27 mmol/L (ref 0–100)
TCO2: 26 mmol/L (ref 0–100)
TCO2: 26 mmol/L (ref 0–100)
pCO2 arterial: 47.1 mmHg — ABNORMAL HIGH (ref 35.0–45.0)
pCO2 arterial: 45.6 mmHg — ABNORMAL HIGH (ref 35.0–45.0)
pCO2 arterial: 48.5 mmHg — ABNORMAL HIGH (ref 35.0–45.0)
pCO2 arterial: 47.3 mmHg — ABNORMAL HIGH (ref 35.0–45.0)
pH, Arterial: 7.393 (ref 7.350–7.450)
pH, Arterial: 7.363 (ref 7.350–7.450)
pH, Arterial: 7.311 — ABNORMAL LOW (ref 7.350–7.450)
pH, Arterial: 7.326 — ABNORMAL LOW (ref 7.350–7.450)
pO2, Arterial: 335 mmHg — ABNORMAL HIGH (ref 80.0–100.0)
pO2, Arterial: 139 mmHg — ABNORMAL HIGH (ref 80.0–100.0)
pO2, Arterial: 56 mmHg — ABNORMAL LOW (ref 80.0–100.0)
pO2, Arterial: 69 mmHg — ABNORMAL LOW (ref 80.0–100.0)

## 2014-06-06 LAB — POCT I-STAT, CHEM 8
BUN: 26 mg/dL — ABNORMAL HIGH (ref 6–23)
BUN: 24 mg/dL — ABNORMAL HIGH (ref 6–23)
BUN: 22 mg/dL (ref 6–23)
BUN: 22 mg/dL (ref 6–23)
BUN: 22 mg/dL (ref 6–23)
BUN: 21 mg/dL (ref 6–23)
BUN: 20 mg/dL (ref 6–23)
Calcium, Ion: 1.21 mmol/L (ref 1.13–1.30)
Calcium, Ion: 1.23 mmol/L (ref 1.13–1.30)
Calcium, Ion: 1.07 mmol/L — ABNORMAL LOW (ref 1.13–1.30)
Calcium, Ion: 1.07 mmol/L — ABNORMAL LOW (ref 1.13–1.30)
Calcium, Ion: 1.07 mmol/L — ABNORMAL LOW (ref 1.13–1.30)
Calcium, Ion: 1.01 mmol/L — ABNORMAL LOW (ref 1.13–1.30)
Calcium, Ion: 1.14 mmol/L (ref 1.13–1.30)
Chloride: 100 mEq/L (ref 96–112)
Chloride: 100 mEq/L (ref 96–112)
Chloride: 97 mEq/L (ref 96–112)
Chloride: 96 mEq/L (ref 96–112)
Chloride: 98 mEq/L (ref 96–112)
Chloride: 101 mEq/L (ref 96–112)
Chloride: 104 mEq/L (ref 96–112)
Creatinine, Ser: 1 mg/dL (ref 0.50–1.35)
Creatinine, Ser: 0.9 mg/dL (ref 0.50–1.35)
Creatinine, Ser: 0.8 mg/dL (ref 0.50–1.35)
Creatinine, Ser: 0.8 mg/dL (ref 0.50–1.35)
Creatinine, Ser: 0.9 mg/dL (ref 0.50–1.35)
Creatinine, Ser: 0.9 mg/dL (ref 0.50–1.35)
Creatinine, Ser: 1 mg/dL (ref 0.50–1.35)
Glucose, Bld: 133 mg/dL — ABNORMAL HIGH (ref 70–99)
Glucose, Bld: 158 mg/dL — ABNORMAL HIGH (ref 70–99)
Glucose, Bld: 154 mg/dL — ABNORMAL HIGH (ref 70–99)
Glucose, Bld: 155 mg/dL — ABNORMAL HIGH (ref 70–99)
Glucose, Bld: 181 mg/dL — ABNORMAL HIGH (ref 70–99)
Glucose, Bld: 180 mg/dL — ABNORMAL HIGH (ref 70–99)
Glucose, Bld: 158 mg/dL — ABNORMAL HIGH (ref 70–99)
HCT: 41 % (ref 39.0–52.0)
HCT: 37 % — ABNORMAL LOW (ref 39.0–52.0)
HCT: 31 % — ABNORMAL LOW (ref 39.0–52.0)
HCT: 30 % — ABNORMAL LOW (ref 39.0–52.0)
HCT: 29 % — ABNORMAL LOW (ref 39.0–52.0)
HCT: 27 % — ABNORMAL LOW (ref 39.0–52.0)
HCT: 30 % — ABNORMAL LOW (ref 39.0–52.0)
Hemoglobin: 13.9 g/dL (ref 13.0–17.0)
Hemoglobin: 12.6 g/dL — ABNORMAL LOW (ref 13.0–17.0)
Hemoglobin: 10.5 g/dL — ABNORMAL LOW (ref 13.0–17.0)
Hemoglobin: 10.2 g/dL — ABNORMAL LOW (ref 13.0–17.0)
Hemoglobin: 9.9 g/dL — ABNORMAL LOW (ref 13.0–17.0)
Hemoglobin: 9.2 g/dL — ABNORMAL LOW (ref 13.0–17.0)
Hemoglobin: 10.2 g/dL — ABNORMAL LOW (ref 13.0–17.0)
Potassium: 4.8 mEq/L (ref 3.7–5.3)
Potassium: 4.4 mEq/L (ref 3.7–5.3)
Potassium: 3.8 mEq/L (ref 3.7–5.3)
Potassium: 4.7 mEq/L (ref 3.7–5.3)
Potassium: 4.9 mEq/L (ref 3.7–5.3)
Potassium: 4 mEq/L (ref 3.7–5.3)
Potassium: 4.7 mEq/L (ref 3.7–5.3)
Sodium: 138 mEq/L (ref 137–147)
Sodium: 137 mEq/L (ref 137–147)
Sodium: 134 mEq/L — ABNORMAL LOW (ref 137–147)
Sodium: 132 mEq/L — ABNORMAL LOW (ref 137–147)
Sodium: 134 mEq/L — ABNORMAL LOW (ref 137–147)
Sodium: 136 mEq/L — ABNORMAL LOW (ref 137–147)
Sodium: 138 mEq/L (ref 137–147)
TCO2: 27 mmol/L (ref 0–100)
TCO2: 26 mmol/L (ref 0–100)
TCO2: 25 mmol/L (ref 0–100)
TCO2: 24 mmol/L (ref 0–100)
TCO2: 25 mmol/L (ref 0–100)
TCO2: 23 mmol/L (ref 0–100)
TCO2: 23 mmol/L (ref 0–100)

## 2014-06-06 LAB — CBC
HCT: 30.9 % — ABNORMAL LOW (ref 39.0–52.0)
HEMATOCRIT: 30.8 % — AB (ref 39.0–52.0)
Hemoglobin: 10.2 g/dL — ABNORMAL LOW (ref 13.0–17.0)
Hemoglobin: 10.3 g/dL — ABNORMAL LOW (ref 13.0–17.0)
MCH: 28.7 pg (ref 26.0–34.0)
MCH: 29.4 pg (ref 26.0–34.0)
MCHC: 33.1 g/dL (ref 30.0–36.0)
MCHC: 33.3 g/dL (ref 30.0–36.0)
MCV: 86.8 fL (ref 78.0–100.0)
MCV: 88.3 fL (ref 78.0–100.0)
Platelets: 145 10*3/uL — ABNORMAL LOW (ref 150–400)
Platelets: 170 10*3/uL (ref 150–400)
RBC: 3.55 MIL/uL — ABNORMAL LOW (ref 4.22–5.81)
RBC: 3.5 MIL/uL — ABNORMAL LOW (ref 4.22–5.81)
RDW: 12.6 % (ref 11.5–15.5)
RDW: 12.8 % (ref 11.5–15.5)
WBC: 14.2 10*3/uL — AB (ref 4.0–10.5)
WBC: 14.9 10*3/uL — ABNORMAL HIGH (ref 4.0–10.5)

## 2014-06-06 LAB — HEMOGLOBIN AND HEMATOCRIT, BLOOD
HCT: 31.7 % — ABNORMAL LOW (ref 39.0–52.0)
Hemoglobin: 10.7 g/dL — ABNORMAL LOW (ref 13.0–17.0)

## 2014-06-06 LAB — POCT I-STAT 4, (NA,K, GLUC, HGB,HCT)
Glucose, Bld: 157 mg/dL — ABNORMAL HIGH (ref 70–99)
HCT: 33 % — ABNORMAL LOW (ref 39.0–52.0)
Hemoglobin: 11.2 g/dL — ABNORMAL LOW (ref 13.0–17.0)
Potassium: 3.8 mEq/L (ref 3.7–5.3)
Sodium: 139 mEq/L (ref 137–147)

## 2014-06-06 LAB — CREATININE, SERUM
Creatinine, Ser: 1.04 mg/dL (ref 0.50–1.35)
GFR calc Af Amer: 81 mL/min — ABNORMAL LOW (ref >90)
GFR calc non Af Amer: 70 mL/min — ABNORMAL LOW (ref >90)

## 2014-06-06 LAB — PROTIME-INR
INR: 1.69 — AB (ref 0.00–1.49)
Prothrombin Time: 19.9 seconds — ABNORMAL HIGH (ref 11.6–15.2)

## 2014-06-06 LAB — GLUCOSE, CAPILLARY: Glucose-Capillary: 127 mg/dL — ABNORMAL HIGH (ref 70–99)

## 2014-06-06 LAB — PLATELET COUNT: Platelets: 171 10*3/uL (ref 150–400)

## 2014-06-06 LAB — APTT: APTT: 36 s (ref 24–37)

## 2014-06-06 LAB — PREPARE RBC (CROSSMATCH)

## 2014-06-06 LAB — MAGNESIUM: Magnesium: 2.6 mg/dL — ABNORMAL HIGH (ref 1.5–2.5)

## 2014-06-06 IMAGING — CR DG CHEST 1V PORT
1 series · 1 of 1 positions shown · non-contrast
Comparison: [DATE]

CLINICAL DATA: Coronary artery disease.  Status post CABG.

EXAM:
PORTABLE CHEST - 1 VIEW

[AP]
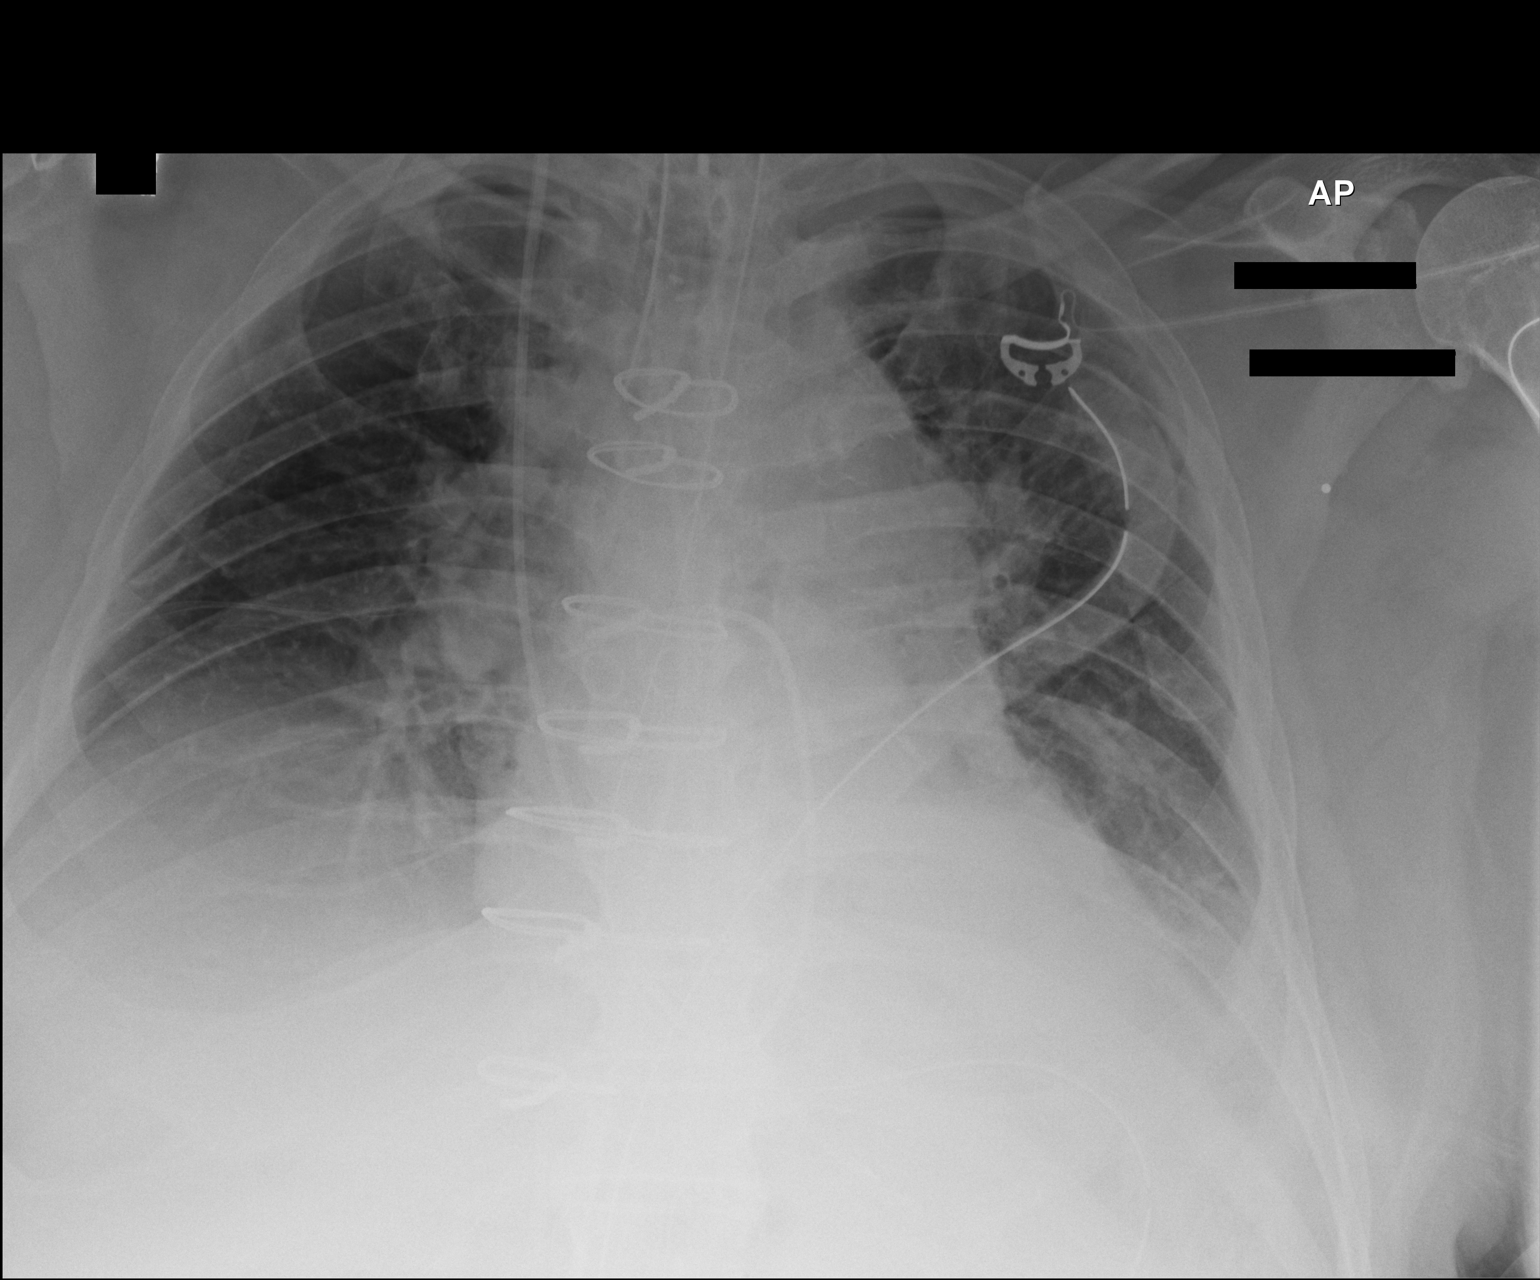

[1 of 1 positions shown; findings below may reference images not displayed]

FINDINGS: Endotracheal tube, NG tube, left chest tube, and Swan-Ganz catheter
all appear in good position. No pneumothorax. Slight widening of the
mediastinum. Atelectasis and small effusion at the left base.
Probable small right effusion.
IMPRESSION: Small bilateral effusions with left base atelectasis. No
pneumothorax.

## 2014-06-06 SURGERY — CORONARY ARTERY BYPASS GRAFTING (CABG)
Anesthesia: General | Site: Chest

## 2014-06-06 MED ORDER — VECURONIUM BROMIDE 10 MG IV SOLR
INTRAVENOUS | Status: AC
  Filled 2014-06-06: qty 30

## 2014-06-06 MED ORDER — ALBUMIN HUMAN 5 % IV SOLN
250.0000 mL | INTRAVENOUS | Status: AC | PRN
  Administered 2014-06-06: 250 mL via INTRAVENOUS

## 2014-06-06 MED ORDER — PROTAMINE SULFATE 10 MG/ML IV SOLN
INTRAVENOUS | Status: AC
  Filled 2014-06-06: qty 25

## 2014-06-06 MED ORDER — GABAPENTIN 300 MG PO CAPS
300.0000 mg | ORAL_CAPSULE | Freq: Four times a day (QID) | ORAL | Status: DC
  Administered 2014-06-07 (×4): 300 mg via ORAL
  Filled 2014-06-06 (×8): qty 1

## 2014-06-06 MED ORDER — VECURONIUM BROMIDE 10 MG IV SOLR
INTRAVENOUS | Status: AC
  Filled 2014-06-06: qty 10

## 2014-06-06 MED ORDER — VECURONIUM BROMIDE 10 MG IV SOLR
INTRAVENOUS | Status: DC | PRN
  Administered 2014-06-06 (×4): 5 mg via INTRAVENOUS

## 2014-06-06 MED ORDER — ALBUMIN HUMAN 5 % IV SOLN
INTRAVENOUS | Status: DC | PRN
  Administered 2014-06-06 (×3): via INTRAVENOUS

## 2014-06-06 MED ORDER — BISACODYL 10 MG RE SUPP: 10.0000 mg | Freq: Every day | RECTAL | Status: DC

## 2014-06-06 MED ORDER — SODIUM CHLORIDE 0.9 % IV SOLN
INTRAVENOUS | Status: DC
  Administered 2014-06-06: 9.5 [IU]/h via INTRAVENOUS
  Filled 2014-06-06: qty 2.5

## 2014-06-06 MED ORDER — MIDAZOLAM HCL 10 MG/2ML IJ SOLN
INTRAMUSCULAR | Status: AC
  Filled 2014-06-06: qty 2

## 2014-06-06 MED ORDER — FENTANYL CITRATE 0.05 MG/ML IJ SOLN
INTRAMUSCULAR | Status: AC
  Filled 2014-06-06: qty 5

## 2014-06-06 MED ORDER — DOPAMINE-DEXTROSE 3.2-5 MG/ML-% IV SOLN: 2.5000 ug/kg/min | INTRAVENOUS | Status: DC

## 2014-06-06 MED ORDER — HEPARIN SODIUM (PORCINE) 1000 UNIT/ML IJ SOLN
INTRAMUSCULAR | Status: AC
  Filled 2014-06-06: qty 1

## 2014-06-06 MED ORDER — MILRINONE IN DEXTROSE 20 MG/100ML IV SOLN
0.2500 ug/kg/min | INTRAVENOUS | Status: AC
  Administered 2014-06-06: .3 ug/kg/min via INTRAVENOUS
  Filled 2014-06-06: qty 100

## 2014-06-06 MED ORDER — HEMOSTATIC AGENTS (NO CHARGE) OPTIME
TOPICAL | Status: DC | PRN
  Administered 2014-06-06: 1 via TOPICAL

## 2014-06-06 MED ORDER — ACETAMINOPHEN 160 MG/5ML PO SOLN: 650.0000 mg | Freq: Once | ORAL | Status: AC

## 2014-06-06 MED ORDER — LACTATED RINGERS IV SOLN: 500.0000 mL | Freq: Once | INTRAVENOUS | Status: AC | PRN

## 2014-06-06 MED ORDER — MIDAZOLAM HCL 5 MG/5ML IJ SOLN
INTRAMUSCULAR | Status: DC | PRN
  Administered 2014-06-06: 5 mg via INTRAVENOUS
  Administered 2014-06-06: 2 mg via INTRAVENOUS
  Administered 2014-06-06: 3 mg via INTRAVENOUS
  Administered 2014-06-06: 2 mg via INTRAVENOUS

## 2014-06-06 MED ORDER — ROCURONIUM BROMIDE 100 MG/10ML IV SOLN
INTRAVENOUS | Status: DC | PRN
  Administered 2014-06-06: 100 mg via INTRAVENOUS
  Administered 2014-06-06 (×2): 50 mg via INTRAVENOUS

## 2014-06-06 MED ORDER — SODIUM CHLORIDE 0.9 % IJ SOLN: 3.0000 mL | INTRAMUSCULAR | Status: DC | PRN

## 2014-06-06 MED ORDER — AMIODARONE HCL IN DEXTROSE 360-4.14 MG/200ML-% IV SOLN
60.0000 mg/h | INTRAVENOUS | Status: AC
  Administered 2014-06-06 (×2): 60 mg/h via INTRAVENOUS
  Filled 2014-06-06: qty 200

## 2014-06-06 MED ORDER — INSULIN REGULAR BOLUS VIA INFUSION
0.0000 [IU] | Freq: Three times a day (TID) | INTRAVENOUS | Status: DC
  Administered 2014-06-07: 3 [IU] via INTRAVENOUS
  Administered 2014-06-07: 3.7 [IU] via INTRAVENOUS
  Filled 2014-06-06: qty 10

## 2014-06-06 MED ORDER — ACETAMINOPHEN 160 MG/5ML PO SOLN
1000.0000 mg | Freq: Four times a day (QID) | ORAL | Status: AC
  Filled 2014-06-06: qty 40

## 2014-06-06 MED ORDER — SODIUM CHLORIDE 0.9 % IV SOLN
250.0000 [IU] | INTRAVENOUS | Status: DC | PRN
  Administered 2014-06-06: 2.2 [IU]/h via INTRAVENOUS

## 2014-06-06 MED ORDER — DEXMEDETOMIDINE HCL IN NACL 200 MCG/50ML IV SOLN
INTRAVENOUS | Status: AC
  Filled 2014-06-06: qty 50

## 2014-06-06 MED ORDER — SODIUM CHLORIDE 0.9 % IV SOLN
Freq: Once | INTRAVENOUS | Status: AC
  Administered 2014-06-06: 14:00:00 via INTRAVENOUS

## 2014-06-06 MED ORDER — METOPROLOL TARTRATE 1 MG/ML IV SOLN
2.5000 mg | INTRAVENOUS | Status: DC | PRN
  Administered 2014-06-06: 5 mg via INTRAVENOUS

## 2014-06-06 MED ORDER — LACTATED RINGERS IV SOLN
INTRAVENOUS | Status: DC | PRN
  Administered 2014-06-06: 07:00:00 via INTRAVENOUS

## 2014-06-06 MED ORDER — STERILE WATER FOR INJECTION IJ SOLN
INTRAMUSCULAR | Status: AC
  Filled 2014-06-06: qty 10

## 2014-06-06 MED ORDER — AMIODARONE LOAD VIA INFUSION
150.0000 mg | Freq: Once | INTRAVENOUS | Status: AC
  Administered 2014-06-06: 150 mg via INTRAVENOUS
  Filled 2014-06-06: qty 83.34

## 2014-06-06 MED ORDER — SODIUM CHLORIDE 0.9 % IV SOLN: 10.0000 g | INTRAVENOUS | Status: DC | PRN

## 2014-06-06 MED ORDER — MORPHINE SULFATE 2 MG/ML IJ SOLN
1.0000 mg | INTRAMUSCULAR | Status: AC | PRN
  Administered 2014-06-06 – 2014-06-07 (×2): 2 mg via INTRAVENOUS
  Filled 2014-06-06 (×3): qty 1

## 2014-06-06 MED ORDER — SIMETHICONE 80 MG PO CHEW
125.0000 mg | CHEWABLE_TABLET | Freq: Every day | ORAL | Status: DC
  Administered 2014-06-07 – 2014-06-14 (×8): 120 mg via ORAL
  Filled 2014-06-06 (×8): qty 2

## 2014-06-06 MED ORDER — OXYCODONE HCL 5 MG PO TABS
5.0000 mg | ORAL_TABLET | ORAL | Status: DC | PRN
  Administered 2014-06-07 – 2014-06-13 (×9): 10 mg via ORAL
  Filled 2014-06-06 (×9): qty 2

## 2014-06-06 MED ORDER — ASPIRIN 81 MG PO CHEW: 324.0000 mg | CHEWABLE_TABLET | Freq: Every day | ORAL | Status: DC

## 2014-06-06 MED ORDER — FENTANYL CITRATE 0.05 MG/ML IJ SOLN
INTRAMUSCULAR | Status: DC | PRN
  Administered 2014-06-06: 250 ug via INTRAVENOUS
  Administered 2014-06-06: 100 ug via INTRAVENOUS
  Administered 2014-06-06: 150 ug via INTRAVENOUS
  Administered 2014-06-06 (×2): 250 ug via INTRAVENOUS
  Administered 2014-06-06: 500 ug via INTRAVENOUS

## 2014-06-06 MED ORDER — PROPOFOL 10 MG/ML IV BOLUS
INTRAVENOUS | Status: DC | PRN
  Administered 2014-06-06: 100 mg via INTRAVENOUS

## 2014-06-06 MED ORDER — METOPROLOL TARTRATE 12.5 MG HALF TABLET
12.5000 mg | ORAL_TABLET | Freq: Two times a day (BID) | ORAL | Status: DC
  Administered 2014-06-07 – 2014-06-14 (×13): 12.5 mg via ORAL
  Filled 2014-06-06 (×19): qty 1

## 2014-06-06 MED ORDER — TRAMADOL HCL 50 MG PO TABS
50.0000 mg | ORAL_TABLET | ORAL | Status: DC | PRN
Start: 2014-06-06 — End: 2014-06-14
  Administered 2014-06-09 – 2014-06-11 (×2): 100 mg via ORAL
  Filled 2014-06-06 (×2): qty 2

## 2014-06-06 MED ORDER — ROCURONIUM BROMIDE 50 MG/5ML IV SOLN
INTRAVENOUS | Status: AC
  Filled 2014-06-06: qty 2

## 2014-06-06 MED ORDER — LACTATED RINGERS IV SOLN
INTRAVENOUS | Status: DC
  Administered 2014-06-06: 16:00:00 via INTRAVENOUS

## 2014-06-06 MED ORDER — CETYLPYRIDINIUM CHLORIDE 0.05 % MT LIQD
7.0000 mL | Freq: Four times a day (QID) | OROMUCOSAL | Status: DC
  Administered 2014-06-07 – 2014-06-08 (×5): 7 mL via OROMUCOSAL

## 2014-06-06 MED ORDER — SODIUM CHLORIDE 0.9 % IJ SOLN
3.0000 mL | Freq: Two times a day (BID) | INTRAMUSCULAR | Status: DC
  Administered 2014-06-07 – 2014-06-08 (×3): 3 mL via INTRAVENOUS

## 2014-06-06 MED ORDER — FAMOTIDINE IN NACL 20-0.9 MG/50ML-% IV SOLN
20.0000 mg | Freq: Two times a day (BID) | INTRAVENOUS | Status: AC
  Administered 2014-06-06: 20 mg via INTRAVENOUS

## 2014-06-06 MED ORDER — SUCCINYLCHOLINE CHLORIDE 20 MG/ML IJ SOLN
INTRAMUSCULAR | Status: DC | PRN
  Administered 2014-06-06: 100 mg via INTRAVENOUS

## 2014-06-06 MED ORDER — PROTAMINE SULFATE 10 MG/ML IV SOLN
INTRAVENOUS | Status: DC | PRN
  Administered 2014-06-06: 400 mg via INTRAVENOUS

## 2014-06-06 MED ORDER — LACTATED RINGERS IV SOLN
INTRAVENOUS | Status: DC | PRN
  Administered 2014-06-06 (×2): via INTRAVENOUS

## 2014-06-06 MED ORDER — ARTIFICIAL TEARS OP OINT
TOPICAL_OINTMENT | OPHTHALMIC | Status: AC
  Filled 2014-06-06: qty 3.5

## 2014-06-06 MED ORDER — NITROGLYCERIN IN D5W 200-5 MCG/ML-% IV SOLN: 0.0000 ug/min | INTRAVENOUS | Status: DC

## 2014-06-06 MED ORDER — HEPARIN SODIUM (PORCINE) 1000 UNIT/ML IJ SOLN
INTRAMUSCULAR | Status: DC | PRN
  Administered 2014-06-06: 2 mL via INTRAVENOUS
  Administered 2014-06-06: 43 mL via INTRAVENOUS

## 2014-06-06 MED ORDER — SODIUM CHLORIDE 0.9 % IV SOLN: 250.0000 mL | INTRAVENOUS | Status: DC

## 2014-06-06 MED ORDER — MIDAZOLAM HCL 2 MG/2ML IJ SOLN
INTRAMUSCULAR | Status: AC
  Filled 2014-06-06: qty 2

## 2014-06-06 MED ORDER — ASPIRIN EC 325 MG PO TBEC
325.0000 mg | DELAYED_RELEASE_TABLET | Freq: Every day | ORAL | Status: DC
  Administered 2014-06-07 – 2014-06-14 (×8): 325 mg via ORAL
  Filled 2014-06-06 (×9): qty 1

## 2014-06-06 MED ORDER — POTASSIUM CHLORIDE 10 MEQ/50ML IV SOLN
10.0000 meq | INTRAVENOUS | Status: AC
  Administered 2014-06-06 (×3): 10 meq via INTRAVENOUS

## 2014-06-06 MED ORDER — PANTOPRAZOLE SODIUM 40 MG PO TBEC
40.0000 mg | DELAYED_RELEASE_TABLET | Freq: Every day | ORAL | Status: DC
  Administered 2014-06-08 – 2014-06-14 (×7): 40 mg via ORAL
  Filled 2014-06-06 (×7): qty 1

## 2014-06-06 MED ORDER — MIDAZOLAM HCL 2 MG/2ML IJ SOLN: 2.0000 mg | INTRAMUSCULAR | Status: DC | PRN

## 2014-06-06 MED ORDER — SODIUM CHLORIDE 0.9 % IV SOLN
INTRAVENOUS | Status: DC
  Administered 2014-06-06: 16:00:00 via INTRAVENOUS

## 2014-06-06 MED ORDER — POTASSIUM CHLORIDE 10 MEQ/50ML IV SOLN
10.0000 meq | Freq: Once | INTRAVENOUS | Status: AC
  Administered 2014-06-06: 10 meq via INTRAVENOUS

## 2014-06-06 MED ORDER — CHLORHEXIDINE GLUCONATE 0.12 % MT SOLN
15.0000 mL | Freq: Two times a day (BID) | OROMUCOSAL | Status: DC
  Administered 2014-06-06 – 2014-06-07 (×2): 15 mL via OROMUCOSAL
  Filled 2014-06-06 (×2): qty 15

## 2014-06-06 MED ORDER — ACETAMINOPHEN 500 MG PO TABS
1000.0000 mg | ORAL_TABLET | Freq: Four times a day (QID) | ORAL | Status: AC
  Administered 2014-06-07 – 2014-06-11 (×16): 1000 mg via ORAL
  Filled 2014-06-06 (×22): qty 2

## 2014-06-06 MED ORDER — DOCUSATE SODIUM 100 MG PO CAPS
200.0000 mg | ORAL_CAPSULE | Freq: Every day | ORAL | Status: DC
  Administered 2014-06-07 – 2014-06-14 (×7): 200 mg via ORAL
  Filled 2014-06-06 (×7): qty 2

## 2014-06-06 MED ORDER — SODIUM CHLORIDE 0.9 % IJ SOLN
OROMUCOSAL | Status: DC | PRN
  Administered 2014-06-06 (×3): via TOPICAL

## 2014-06-06 MED ORDER — MAGNESIUM SULFATE 4000MG/100ML IJ SOLN
4.0000 g | Freq: Once | INTRAMUSCULAR | Status: AC
  Administered 2014-06-06: 4 g via INTRAVENOUS
  Filled 2014-06-06: qty 100

## 2014-06-06 MED ORDER — SODIUM CHLORIDE 0.9 % IR SOLN
Status: DC | PRN
  Administered 2014-06-06: 6000 mL

## 2014-06-06 MED ORDER — AMIODARONE HCL IN DEXTROSE 360-4.14 MG/200ML-% IV SOLN
30.0000 mg/h | INTRAVENOUS | Status: DC
  Administered 2014-06-06 – 2014-06-07 (×3): 30 mg/h via INTRAVENOUS
  Filled 2014-06-06 (×6): qty 200

## 2014-06-06 MED ORDER — METOPROLOL TARTRATE 25 MG/10 ML ORAL SUSPENSION
12.5000 mg | Freq: Two times a day (BID) | ORAL | Status: DC
  Filled 2014-06-06 (×17): qty 5

## 2014-06-06 MED ORDER — BISACODYL 5 MG PO TBEC
10.0000 mg | DELAYED_RELEASE_TABLET | Freq: Every day | ORAL | Status: DC
  Administered 2014-06-07 – 2014-06-14 (×7): 10 mg via ORAL
  Filled 2014-06-06 (×7): qty 2

## 2014-06-06 MED ORDER — ACETAMINOPHEN 650 MG RE SUPP
650.0000 mg | Freq: Once | RECTAL | Status: AC
  Administered 2014-06-06: 650 mg via RECTAL

## 2014-06-06 MED ORDER — DOPAMINE-DEXTROSE 3.2-5 MG/ML-% IV SOLN
INTRAVENOUS | Status: DC | PRN
  Administered 2014-06-06: 3 ug/kg/min via INTRAVENOUS

## 2014-06-06 MED ORDER — PHENYLEPHRINE HCL 10 MG/ML IJ SOLN
10.0000 mg | INTRAVENOUS | Status: DC | PRN
  Administered 2014-06-06: 15 ug/min via INTRAVENOUS

## 2014-06-06 MED ORDER — AMIODARONE HCL IN DEXTROSE 360-4.14 MG/200ML-% IV SOLN
INTRAVENOUS | Status: AC
  Administered 2014-06-06: 200 mL
  Filled 2014-06-06: qty 200

## 2014-06-06 MED ORDER — NITROGLYCERIN IN D5W 200-5 MCG/ML-% IV SOLN
INTRAVENOUS | Status: DC | PRN
  Administered 2014-06-06: 5 ug/min via INTRAVENOUS

## 2014-06-06 MED ORDER — ATORVASTATIN CALCIUM 80 MG PO TABS
80.0000 mg | ORAL_TABLET | Freq: Every day | ORAL | Status: DC
  Administered 2014-06-07 – 2014-06-13 (×7): 80 mg via ORAL
  Filled 2014-06-06 (×11): qty 1

## 2014-06-06 MED ORDER — GLYCOPYRROLATE 0.2 MG/ML IJ SOLN
INTRAMUSCULAR | Status: DC | PRN
  Administered 2014-06-06: 0.2 mg via INTRAVENOUS

## 2014-06-06 MED ORDER — VANCOMYCIN HCL IN DEXTROSE 1-5 GM/200ML-% IV SOLN
1000.0000 mg | Freq: Two times a day (BID) | INTRAVENOUS | Status: AC
  Administered 2014-06-06 – 2014-06-07 (×3): 1000 mg via INTRAVENOUS
  Filled 2014-06-06 (×4): qty 200

## 2014-06-06 MED ORDER — CHLORHEXIDINE GLUCONATE 4 % EX LIQD
30.0000 mL | CUTANEOUS | Status: DC
  Filled 2014-06-06: qty 30

## 2014-06-06 MED ORDER — DEXTROSE 5 % IV SOLN
1.5000 g | Freq: Two times a day (BID) | INTRAVENOUS | Status: AC
  Administered 2014-06-06 – 2014-06-08 (×4): 1.5 g via INTRAVENOUS
  Filled 2014-06-06 (×4): qty 1.5

## 2014-06-06 MED ORDER — MILRINONE IN DEXTROSE 20 MG/100ML IV SOLN
0.3000 ug/kg/min | INTRAVENOUS | Status: DC
  Administered 2014-06-06 – 2014-06-08 (×5): 0.3 ug/kg/min via INTRAVENOUS
  Filled 2014-06-06 (×4): qty 100

## 2014-06-06 MED ORDER — MORPHINE SULFATE 2 MG/ML IJ SOLN
2.0000 mg | INTRAMUSCULAR | Status: DC | PRN
  Administered 2014-06-07: 4 mg via INTRAVENOUS
  Administered 2014-06-07: 2 mg via INTRAVENOUS
  Administered 2014-06-07: 4 mg via INTRAVENOUS
  Filled 2014-06-06 (×2): qty 2

## 2014-06-06 MED ORDER — SODIUM CHLORIDE 0.45 % IV SOLN
INTRAVENOUS | Status: DC
  Administered 2014-06-06: 16:00:00 via INTRAVENOUS

## 2014-06-06 MED ORDER — MILRINONE IN DEXTROSE 20 MG/100ML IV SOLN
0.2500 ug/kg/min | INTRAVENOUS | Status: DC
  Filled 2014-06-06: qty 100

## 2014-06-06 MED ORDER — PHENYLEPHRINE HCL 10 MG/ML IJ SOLN
0.0000 ug/min | INTRAVENOUS | Status: DC
  Filled 2014-06-06: qty 2

## 2014-06-06 MED ORDER — PROPOFOL 10 MG/ML IV BOLUS
INTRAVENOUS | Status: AC
  Filled 2014-06-06: qty 20

## 2014-06-06 MED ORDER — ONDANSETRON HCL 4 MG/2ML IJ SOLN: 4.0000 mg | Freq: Four times a day (QID) | INTRAMUSCULAR | Status: DC | PRN

## 2014-06-06 MED ORDER — DEXMEDETOMIDINE HCL IN NACL 200 MCG/50ML IV SOLN
0.0000 ug/kg/h | INTRAVENOUS | Status: DC
  Administered 2014-06-06: 0.6 ug/kg/h via INTRAVENOUS
  Filled 2014-06-06: qty 50

## 2014-06-06 SURGICAL SUPPLY — 128 items
ADAPTER CARDIO PERF ANTE/RETRO (ADAPTER) ×6 IMPLANT
ADH SKN CLS APL DERMABOND .7 (GAUZE/BANDAGES/DRESSINGS) ×2
ADPR PRFSN 84XANTGRD RTRGD (ADAPTER) ×2
APPLICATOR COTTON TIP 6IN STRL (MISCELLANEOUS) IMPLANT
ATTRACTOMAT 16X20 MAGNETIC DRP (DRAPES) ×6 IMPLANT
BAG DECANTER FOR FLEXI CONT (MISCELLANEOUS) ×6 IMPLANT
BANDAGE ELASTIC 4 VELCRO ST LF (GAUZE/BANDAGES/DRESSINGS) ×4 IMPLANT
BANDAGE ELASTIC 6 VELCRO ST LF (GAUZE/BANDAGES/DRESSINGS) ×4 IMPLANT
BASKET HEART  (ORDER IN 25'S) (MISCELLANEOUS) ×1
BASKET HEART (ORDER IN 25'S) (MISCELLANEOUS) ×1
BASKET HEART (ORDER IN 25S) (MISCELLANEOUS) ×2 IMPLANT
BLADE STERNUM SYSTEM 6 (BLADE) ×6 IMPLANT
BLADE SURG 11 STRL SS (BLADE) ×2 IMPLANT
BLADE SURG 12 STRL SS (BLADE) ×6 IMPLANT
BLADE SURG 15 STRL LF DISP TIS (BLADE) ×2 IMPLANT
BLADE SURG 15 STRL SS (BLADE) ×4
BLADE SURG ROTATE 9660 (MISCELLANEOUS) ×2 IMPLANT
BNDG GAUZE ELAST 4 BULKY (GAUZE/BANDAGES/DRESSINGS) ×4 IMPLANT
BOOT SUTURE AID YELLOW STND (SUTURE) IMPLANT
CANISTER SUCTION 2500CC (MISCELLANEOUS) ×6 IMPLANT
CANNULA ARTERIAL NVNT 3/8 22FR (MISCELLANEOUS) ×2 IMPLANT
CANNULA GUNDRY RCSP 15FR (MISCELLANEOUS) ×6 IMPLANT
CANNULA VENOUS LOW PROF 32X40 (CANNULA) IMPLANT
CANNULA VESSEL 3MM BLUNT TIP (CANNULA) ×2 IMPLANT
CARDIAC SUCTION (MISCELLANEOUS) ×2 IMPLANT
CATH CPB KIT VANTRIGT (MISCELLANEOUS) ×4 IMPLANT
CATH ROBINSON RED A/P 18FR (CATHETERS) ×18 IMPLANT
CATH THORACIC 36FR RT ANG (CATHETERS) ×6 IMPLANT
CLIP FOGARTY SPRING 6M (CLIP) ×4 IMPLANT
CLIP TI WIDE RED SMALL 24 (CLIP) ×2 IMPLANT
CONN 1/2X1/2X1/2  BEN (MISCELLANEOUS)
CONN 1/2X1/2X1/2 BEN (MISCELLANEOUS) ×2 IMPLANT
CONN 3/8X1/2 ST GISH (MISCELLANEOUS) ×8 IMPLANT
CONT SPEC 4OZ CLIKSEAL STRL BL (MISCELLANEOUS) ×2 IMPLANT
COVER SURGICAL LIGHT HANDLE (MISCELLANEOUS) ×6 IMPLANT
CRADLE DONUT ADULT HEAD (MISCELLANEOUS) ×6 IMPLANT
DERMABOND ADVANCED (GAUZE/BANDAGES/DRESSINGS) ×2
DERMABOND ADVANCED .7 DNX12 (GAUZE/BANDAGES/DRESSINGS) IMPLANT
DRAIN CHANNEL 32F RND 10.7 FF (WOUND CARE) ×4 IMPLANT
DRAPE CARDIOVASCULAR INCISE (DRAPES) ×4
DRAPE SLUSH/WARMER DISC (DRAPES) ×6 IMPLANT
DRAPE SRG 135X102X78XABS (DRAPES) ×4 IMPLANT
DRSG AQUACEL AG ADV 3.5X14 (GAUZE/BANDAGES/DRESSINGS) ×6 IMPLANT
ELECT BLADE 4.0 EZ CLEAN MEGAD (MISCELLANEOUS) ×4
ELECT BLADE 6.5 EXT (BLADE) ×6 IMPLANT
ELECT CAUTERY BLADE 6.4 (BLADE) ×8 IMPLANT
ELECT REM PT RETURN 9FT ADLT (ELECTROSURGICAL) ×8
ELECTRODE BLDE 4.0 EZ CLN MEGD (MISCELLANEOUS) ×2 IMPLANT
ELECTRODE REM PT RTRN 9FT ADLT (ELECTROSURGICAL) ×8 IMPLANT
GAUZE SPONGE 4X4 12PLY STRL (GAUZE/BANDAGES/DRESSINGS) ×12 IMPLANT
GLOVE BIO SURGEON STRL SZ 6 (GLOVE) IMPLANT
GLOVE BIO SURGEON STRL SZ 6.5 (GLOVE) IMPLANT
GLOVE BIO SURGEON STRL SZ7.5 (GLOVE) ×14 IMPLANT
GLOVE BIO SURGEONS STRL SZ 6.5 (GLOVE)
GLOVE BIOGEL PI IND STRL 6 (GLOVE) IMPLANT
GLOVE BIOGEL PI IND STRL 7.0 (GLOVE) IMPLANT
GLOVE BIOGEL PI INDICATOR 6 (GLOVE) ×10
GLOVE BIOGEL PI INDICATOR 7.0 (GLOVE) ×12
GOWN STRL REUS W/ TWL LRG LVL3 (GOWN DISPOSABLE) ×16 IMPLANT
GOWN STRL REUS W/TWL LRG LVL3 (GOWN DISPOSABLE) ×44
HEMOSTAT POWDER SURGIFOAM 1G (HEMOSTASIS) ×18 IMPLANT
HEMOSTAT SURGICEL 2X14 (HEMOSTASIS) ×6 IMPLANT
INSERT FOGARTY XLG (MISCELLANEOUS) ×2 IMPLANT
KIT BASIN OR (CUSTOM PROCEDURE TRAY) ×6 IMPLANT
KIT ROOM TURNOVER OR (KITS) ×6 IMPLANT
KIT SUCTION CATH 14FR (SUCTIONS) ×6 IMPLANT
KIT VASOVIEW W/TROCAR VH 2000 (KITS) ×4 IMPLANT
LEAD PACING MYOCARDI (MISCELLANEOUS) ×4 IMPLANT
LINE VENT (MISCELLANEOUS) ×2 IMPLANT
MARKER GRAFT CORONARY BYPASS (MISCELLANEOUS) ×14 IMPLANT
NS IRRIG 1000ML POUR BTL (IV SOLUTION) ×30 IMPLANT
PACK OPEN HEART (CUSTOM PROCEDURE TRAY) ×6 IMPLANT
PAD ARMBOARD 7.5X6 YLW CONV (MISCELLANEOUS) ×12 IMPLANT
PAD ELECT DEFIB RADIOL ZOLL (MISCELLANEOUS) ×4 IMPLANT
PENCIL BUTTON HOLSTER BLD 10FT (ELECTRODE) ×6 IMPLANT
PUNCH AORTIC ROTATE 4.0MM (MISCELLANEOUS) IMPLANT
PUNCH AORTIC ROTATE 4.5MM 8IN (MISCELLANEOUS) ×4 IMPLANT
PUNCH AORTIC ROTATE 5MM 8IN (MISCELLANEOUS) IMPLANT
SET CARDIOPLEGIA MPS 5001102 (MISCELLANEOUS) ×2 IMPLANT
SPONGE GAUZE 4X4 12PLY STER LF (GAUZE/BANDAGES/DRESSINGS) ×4 IMPLANT
SPONGE LAP 18X18 X RAY DECT (DISPOSABLE) ×6 IMPLANT
SPONGE LAP 4X18 X RAY DECT (DISPOSABLE) ×4 IMPLANT
SUCKER WEIGHTED FLEX (MISCELLANEOUS) ×4 IMPLANT
SURGIFLO W/THROMBIN 8M KIT (HEMOSTASIS) ×4 IMPLANT
SUT BONE WAX W31G (SUTURE) ×6 IMPLANT
SUT ETHIBOND 2 0 SH (SUTURE) ×4 IMPLANT
SUT ETHIBOND 2 0 SH 36X2 (SUTURE) ×4 IMPLANT
SUT ETHIBOND 2 0 V4 (SUTURE) IMPLANT
SUT ETHIBOND 2 0V4 GREEN (SUTURE) IMPLANT
SUT ETHIBOND 4 0 TF (SUTURE) IMPLANT
SUT ETHIBOND 5 0 C 1 30 (SUTURE) IMPLANT
SUT MNCRL AB 4-0 PS2 18 (SUTURE) ×4 IMPLANT
SUT PROLENE 3 0 RB 1 (SUTURE) ×4 IMPLANT
SUT PROLENE 3 0 SH 1 (SUTURE) ×2 IMPLANT
SUT PROLENE 3 0 SH DA (SUTURE) ×2 IMPLANT
SUT PROLENE 3 0 SH1 36 (SUTURE) IMPLANT
SUT PROLENE 4 0 RB 1 (SUTURE) ×4
SUT PROLENE 4 0 SH DA (SUTURE) ×6 IMPLANT
SUT PROLENE 4-0 RB1 .5 CRCL 36 (SUTURE) ×6 IMPLANT
SUT PROLENE 5 0 C 1 36 (SUTURE) IMPLANT
SUT PROLENE 6 0 C 1 30 (SUTURE) IMPLANT
SUT PROLENE 6 0 CC (SUTURE) ×18 IMPLANT
SUT PROLENE 7 0 BV1 MDA (SUTURE) ×2 IMPLANT
SUT PROLENE 8 0 BV175 6 (SUTURE) ×4 IMPLANT
SUT PROLENE BLUE 7 0 (SUTURE) ×8 IMPLANT
SUT SILK  1 MH (SUTURE)
SUT SILK 1 MH (SUTURE) IMPLANT
SUT SILK 2 0 SH CR/8 (SUTURE) IMPLANT
SUT SILK 3 0 SH CR/8 (SUTURE) IMPLANT
SUT STEEL 6MS V (SUTURE) ×12 IMPLANT
SUT STEEL STERNAL CCS#1 18IN (SUTURE) ×4 IMPLANT
SUT STEEL SZ 6 DBL 3X14 BALL (SUTURE) ×4 IMPLANT
SUT VIC AB 1 CTX 18 (SUTURE) ×2 IMPLANT
SUT VIC AB 1 CTX 36 (SUTURE) ×12
SUT VIC AB 1 CTX36XBRD ANBCTR (SUTURE) ×8 IMPLANT
SUT VIC AB 2-0 CT1 27 (SUTURE) ×8
SUT VIC AB 2-0 CT1 TAPERPNT 27 (SUTURE) IMPLANT
SUT VIC AB 2-0 CTX 27 (SUTURE) IMPLANT
SUT VIC AB 3-0 X1 27 (SUTURE) IMPLANT
SUTURE E-PAK OPEN HEART (SUTURE) ×8 IMPLANT
SYSTEM SAHARA CHEST DRAIN ATS (WOUND CARE) ×6 IMPLANT
TAPE CLOTH SURG 4X10 WHT LF (GAUZE/BANDAGES/DRESSINGS) ×4 IMPLANT
TOWEL OR 17X24 6PK STRL BLUE (TOWEL DISPOSABLE) ×12 IMPLANT
TOWEL OR 17X26 10 PK STRL BLUE (TOWEL DISPOSABLE) ×14 IMPLANT
TRAY FOLEY IC TEMP SENS 16FR (CATHETERS) ×6 IMPLANT
TUBING INSUFFLATION (TUBING) ×4 IMPLANT
UNDERPAD 30X30 INCONTINENT (UNDERPADS AND DIAPERS) ×6 IMPLANT
WATER STERILE IRR 1000ML POUR (IV SOLUTION) ×12 IMPLANT

## 2014-06-06 NOTE — Procedures (Signed)
Extubation Procedure Note  Patient Details:   Name: MENDEL ROMEO DOB: March 22, 1941 MRN: [id number]   Airway Documentation:  Patient extubated to 4 lpm nasal cannula.  VC 800 ml, NIF -20, patient able to lift and hold head off bed.  Patient able to breathe around deflated cuff and vocalize post procedure.  Tolerated well, no complications noted.    Evaluation  O2 sats: stable throughout Complications: No apparent complications Patient did tolerate procedure well. Bilateral Breath Sounds: Rhonchi Suctioning: Oral;Airway Yes  Jayleene Glaeser P 06/06/2014, 10:03 PM

## 2014-06-06 NOTE — Progress Notes (Signed)
*  PRELIMINARY RESULTS* Echocardiogram Echocardiogram Transesophageal has been performed.  [name] [name] 06/06/2014, 8:34 AM

## 2014-06-06 NOTE — Progress Notes (Signed)
Patient reports last dose of ASA and that he has taken 2 doses of Advil PM this past week which were not initially on med. Rec. Paged Dr. Morton Peters.

## 2014-06-06 NOTE — Anesthesia Preprocedure Evaluation (Addendum)
Anesthesia Evaluation  Patient identified by MRN, date of birth, ID band Patient awake    Reviewed: Allergy & Precautions, H&P , NPO status , Patient's Chart, lab work & pertinent test results, reviewed documented beta blocker date and time   Airway Mallampati: III TM Distance: >3 FB Neck ROM: Full  Mouth opening: Limited Mouth Opening  Dental  (+) Teeth Intact   Pulmonary former smoker,    Pulmonary exam normal       Cardiovascular hypertension, + CAD, + Past MI, + Peripheral Vascular Disease and +CHF     Neuro/Psych    GI/Hepatic negative GI ROS, Neg liver ROS,   Endo/Other  diabetes, Type 2, Oral Hypoglycemic Agents  Renal/GU Renal disease     Musculoskeletal  (+) Arthritis -,   Abdominal Normal abdominal exam  (+)   Peds  Hematology   Anesthesia Other Findings   Reproductive/Obstetrics                         Anesthesia Physical Anesthesia Plan  ASA: IV  Anesthesia Plan: General   Post-op Pain Management:    Induction: Intravenous  Airway Management Planned: Oral ETT  Additional Equipment: Arterial line, CVP, 3D TEE, PA Cath and Ultrasound Guidance Line Placement  Intra-op Plan:   Post-operative Plan: Post-operative intubation/ventilation  Informed Consent: I have reviewed the patients History and Physical, chart, labs and discussed the procedure including the risks, benefits and alternatives for the proposed anesthesia with the patient or authorized representative who has indicated his/her understanding and acceptance.   Dental advisory given  Plan Discussed with: CRNA, Anesthesiologist and Surgeon  Anesthesia Plan Comments:        Anesthesia Quick Evaluation

## 2014-06-06 NOTE — Progress Notes (Signed)
TCTS BRIEF SICU PROGRESS NOTE  Day of Surgery  S/P Procedure(s) (LRB): CORONARY ARTERY BYPASS GRAFTING (CABG) x 5 USING LEFT AND RIGHT SAPHENOUS LEG VEIN HARVESTED ENDOSCOPICALLY (N/A) INTRAOPERATIVE TRANSESOPHAGEAL ECHOCARDIOGRAM (N/A)   Sedated on vent NSR w/ stable hemodynamics on Neo, Dopamine and Milrinone drips Chest tube output low UOP > 150 mL/hr Labs okay  Plan: Continue routine early postop  [name] [name] H 06/06/2014 5:39 PM

## 2014-06-06 NOTE — Anesthesia Procedure Notes (Signed)
Anesthesia Procedure Note PA catheter:  Routine monitors. Timeout, sterile prep, drape, FBP R neck.  Trendelenburg position.  1% [place]ido local, finder and trocar RIJ 1st pass with US guidance.  Cordis placed over J wire. PA catheter in easily.  Sterile dressing applied.  Patient tolerated well, VSS.  Jenita Seashore, MD  [phone]

## 2014-06-06 NOTE — Op Note (Signed)
[name] [name], LAMBERTSON NO.:  [id number]  MEDICAL RECORD NO.:  [id number]  LOCATION:  2S02C                        FACILITY:  [facility]  PHYSICIAN:  Kerin Perna, M.D.  DATE OF BIRTH:  02/10/1941  DATE OF PROCEDURE:  06/06/2014 DATE OF DISCHARGE:                              OPERATIVE REPORT   OPERATION: 1. Coronary artery bypass grafting x5 (left internal mammary artery to     diagonal, saphenous vein graft to LAD, saphenous vein graft to OM1,     saphenous vein graft to OM2, saphenous vein graft to posterior     descending). 2. Endoscopic harvest of bilateral greater saphenous vein.  SURGEON:  Kerin Perna, M.D.  ASSISTANT:  Doree Fudge, PA-C.  PREOPERATIVE DIAGNOSES:  Severe multivessel coronary artery disease, status post recent non-ST elevation myocardial infarction with heart failure, moderate left ventricular dysfunction.  POSTOPERATIVE DIAGNOSES:  Severe multivessel coronary artery disease, status post recent non-ST elevation myocardial infarction with heart failure, moderate left ventricular dysfunction.  ANESTHESIA:  General by Judie Petit, M.D.  INDICATIONS:  The patient is a 73 year old, morbidly obese, diabetic, who was admitted to the hospital acutely approximately 4-5 weeks ago with acute onset of heart failure.  Cardiac enzymes were positive.  His oxygen saturations improved from 80% to 90% with diuretics and nitroglycerin.  Echo showed EF 25-30 with moderate MR.  He underwent cardiac catheterization at that time, which demonstrated 3-vessel CAD with low EF.  He was treated for his heart failure and started on new medications and discharged home to optimize his medical condition prior to potential combined CABG with mitral valve repair.  At home, he was more diligent about taking his heart medications and following his diet and was able to lose some weight.  He was seen in followup in the Cardiology office and found to be  clinically improved.  A subsequent echocardiogram performed last week showed improvement in global LV ejection fraction now up to 45-50% with minimal mitral regurgitation. He presents today for surgical coronary revascularization for his diffuse 3-vessel CAD including 70% mid RCA, 90% proximal LAD, 99% diagonal, 95% OM1, and 95% OM2.  Prior to surgery, I examined the patient in the office and reviewed results of his cardiac studies.  I discussed the indications, expected benefits of coronary artery bypass grafting for treatment of his coronary artery disease.  I reviewed the alternatives to surgical therapy as well.  I reviewed with the patient the risks to him of CABG and possibly mitral valve repair including risks of MI, stroke, bleeding, blood transfusion requirement, cardiac arrhythmia, infection, and pleural effusions, and death.  After reviewing these issues, he demonstrated his understanding and agreed to proceed with surgery under what I felt was an informed consent.  OPERATIVE FINDINGS: 1. Moderate-to-severe diffuse CAD with intramyocardial OM1 and OM2. 2. Thickened internal mammary artery vessel and thickened pleural     surface from probable inflammatory reaction. 3. No intraoperative packed cells transfusion required. 4. Trace mitral regurgitation on TEE both pre and post separation from     cardiopulmonary bypass.  OPERATIVE PROCEDURE:  The patient was brought to the operating room and placed supine on the operating room table, where general anesthesia  was induced.  The transesophageal echo probe was placed by the anesthesiologist.  The patient was prepped and draped as a sterile field.  A proper time-out was performed.  A sternal incision was made as the saphenous vein was harvested endoscopically from the right leg first and then the left leg.  The left internal mammary artery was harvested as a pedicle graft from its origin at the subclavian vessels.  The chest wall  was extremely thickened and inflamed and the dissection of the vessel was difficult and time-consuming.  There was adequate flow through the vessel, however.  The sternal retractor was placed using the deep blades because of the patient's body habitus.  Habitus.  The pericardium was opened and suspended.  Pursestrings were placed around the ascending aorta and right atrium and heparin was administered.  When the ACT was documented as being therapeutic, the patient was cannulated and placed on cardiopulmonary bypass.  The coronary arteries were identified for grafting.  The mammary artery and vein grafts were prepared for the distal anastomoses.  Cardioplegic cannulas were placed both antegrade, aortic, and retrograde coronary sinus cardioplegia.  The patient was cooled to 32 degrees and the aortic crossclamp was applied.  One liter of cold blood cardioplegia was delivered in split doses between the antegrade and retrograde cardioplegic catheters.  There was good cardioplegic arrest and septal temperature dropped less than 14 degrees.  Cardioplegia was delivered every 20 minutes or less while the crossclamp was in place.  The distal coronary anastomoses were performed.  The first distal anastomosis was the posterior descending branch of the right coronary. The right coronary was heavily calcified along its entire length.  The posterior descending was a 1.5-mm vessel with minimal plaque or calcification.  A reverse saphenous vein was sewn end-to-side with running 7-0 Prolene with good flow through the graft.  Cardioplegia was redosed.  The second distal anastomosis was the OM2 branch of left coronary.  This had a proximal 95% stenosis.  A reverse saphenous vein was sewn end-to- side with running 7-0 Prolene with good flow through the graft. Cardioplegia was redosed.  The third distal anastomosis was the OM1 branch of the left circumflex. This had a proximal 95% stenosis.  A reverse  saphenous vein was sewn end- to-side with running 7-0 Prolene with good flow through the graft.  This vessel was intramyocardial.  Cardioplegia was redosed.  The fourth distal anastomosis was to the diagonal branch to LAD.  This was almost totally occluded and heavily calcified.  The left IMA pedicle was brought through an opening in the left lateral pericardium, was brought down onto the LAD and sewn end-to-side with running 8-0 Prolene. There was good flow through the anastomosis after briefly releasing the pedicle bulldog and the mammary artery.  The bulldog was reapplied and the pedicle was secured to the epicardium with 6-0 Prolene. Cardioplegia was redosed.  The fifth distal anastomosis was to the mid to distal 2/3rd of the LAD. There was a proximal 90% stenosis.  A reverse saphenous vein was sewn end-to-side with running 7-0 Prolene.  There was excellent flow through the graft.  Cardioplegia was redosed.  While the crossclamp remained in place, the 4 proximal vein anastomoses were performed on the ascending aorta with a 4.5 mm punch and running 6- 0 Prolene.  Air was vented from the coronaries with a dose of retrograde warm blood cardioplegia-hot shot.  The crossclamp was then removed.  The vein grafts were de-aired and opened and each had  good flow and hemostasis was documented at the proximal and distal anastomoses.  The cardioplegia cannula was removed.  The patient was rewarmed and reperfused.  Temporary pacing wires were applied.  The lungs re-expanded and the ventilator was resumed.  The patient was then weaned from cardiopulmonary bypass without difficulty on low-dose dopamine and milrinone.  A 2D echo showed preserved global LV function with EF of approximately 50%.  There is trivial mitral regurgitation.  Protamine was administered without adverse reaction.  The cannulas were removed.  The patient remained stable.  The superior pericardial fat was closed over the  aorta.  Anterior mediastinal and left pleural chest tubes were placed and brought out through separate incisions.  The sternum was closed with interrupted wire.  The patient remained hemodynamically stable.  The pectoralis fascia was closed in a running #1 Vicryl.  The subcutaneous and skin layers were closed in running Vicryl and sterile dressings were applied.  Total cardiopulmonary bypass time was 150 minutes.     Kerin PernaPeter Van Trigt, M.D.     PV/MEDQ  D:  06/06/2014  T:  06/06/2014  Job:  [id number]  cc:   Lorine BearsMuhammad Arida, MD

## 2014-06-06 NOTE — Transfer of Care (Signed)
Immediate Anesthesia Transfer of Care Note  Patient: [name] [name]  Procedure(s) Performed: Procedure(s): CORONARY ARTERY BYPASS GRAFTING (CABG) x 5 USING LEFT AND RIGHT SAPHENOUS LEG VEIN HARVESTED ENDOSCOPICALLY (N/A) INTRAOPERATIVE TRANSESOPHAGEAL ECHOCARDIOGRAM (N/A)  Patient Location: ICU  Anesthesia Type:General  Level of Consciousness: Patient remains intubated per anesthesia plan  Airway & Oxygen Therapy: Patient remains intubated per anesthesia plan and Patient placed on Ventilator (see vital sign flow sheet for setting)  Post-op Assessment: Report given to PACU RN  Post vital signs: Reviewed and stable  Complications: No apparent anesthesia complications

## 2014-06-06 NOTE — Brief Op Note (Signed)
06/06/2014  12:29 PM  PATIENT:  [name] [name]  73 y.o. male  PRE-OPERATIVE DIAGNOSIS:  1. Severe multivesel CAD 2. Ischemic mitral regurgitation 3. Ischemic cardiomyopathy  POST-OPERATIVE DIAGNOSIS: 1. Severe multivesel CAD 2. Ischemic mitral regurgitation 3. Ischemic cardiomyopathy  PROCEDURE: INTRAOPERATIVE TRANSESOPHAGEAL ECHOCARDIOGRAM, CORONARY ARTERY BYPASS GRAFTING (CABG) x 5 (LIMA to DIAGONAL, SVG to LAD, SVG to OM1, SVG to OM2, and SVG to PDA) USING LEFT THIGH AND RIGHT THIGH AND LOWER LEG SAPHENOUS LEG VEIN HARVESTED ENDOSCOPICALLY   FINDING: Mild mitral valve regurgitation   SURGEON:  Surgeon(s) and Role:    * Kerin Perna, MD - Primary  PHYSICIAN ASSISTANT: Doree Fudge PA-C  ANESTHESIA:   general  EBL:  Total I/O In: 1400 [I.V.:1400] Out: 1200 [Urine:1200]  BLOOD ADMINISTERED:One FFP and One PLTS  DRAINS: (Chest tubes placed in the mediastinal and pleural spaces)  SPECIMEN:  Source of Specimen:  Portion of LIMA  DISPOSITION OF SPECIMEN:  PATHOLOGY  COUNTS CORRECT:  YES  DICTATION: .Dragon Dictation  PLAN OF CARE: Admit to inpatient   PATIENT DISPOSITION:  ICU - intubated and hemodynamically stable.   Delay start of Pharmacological VTE agent (>24hrs) due to surgical blood loss or risk of bleeding: yes  BASELINE WEIGHT: 129 kg

## 2014-06-06 NOTE — Progress Notes (Addendum)
Paged Dr. Zenaida Niece Tright x 2 with no response paged who was on call per Dr. Laneta Simmers ok to go ahead with placement of lines. Logan wit anesthesia notified

## 2014-06-06 NOTE — Progress Notes (Signed)
The patient was examined and preop studies reviewed. There has been no change from the prior exam and the patient is ready for surgery.     Plan CABG, MVR on E Steichen today

## 2014-06-07 ENCOUNTER — Inpatient Hospital Stay (HOSPITAL_COMMUNITY): Payer: Medicare PPO

## 2014-06-07 ENCOUNTER — Encounter (HOSPITAL_COMMUNITY): Payer: Self-pay | Admitting: Cardiothoracic Surgery

## 2014-06-07 LAB — GLUCOSE, CAPILLARY
Glucose-Capillary: 102 mg/dL — ABNORMAL HIGH (ref 70–99)
Glucose-Capillary: 110 mg/dL — ABNORMAL HIGH (ref 70–99)
Glucose-Capillary: 115 mg/dL — ABNORMAL HIGH (ref 70–99)
Glucose-Capillary: 116 mg/dL — ABNORMAL HIGH (ref 70–99)
Glucose-Capillary: 117 mg/dL — ABNORMAL HIGH (ref 70–99)
Glucose-Capillary: 119 mg/dL — ABNORMAL HIGH (ref 70–99)
Glucose-Capillary: 121 mg/dL — ABNORMAL HIGH (ref 70–99)
Glucose-Capillary: 122 mg/dL — ABNORMAL HIGH (ref 70–99)
Glucose-Capillary: 122 mg/dL — ABNORMAL HIGH (ref 70–99)
Glucose-Capillary: 123 mg/dL — ABNORMAL HIGH (ref 70–99)
Glucose-Capillary: 126 mg/dL — ABNORMAL HIGH (ref 70–99)
Glucose-Capillary: 129 mg/dL — ABNORMAL HIGH (ref 70–99)
Glucose-Capillary: 132 mg/dL — ABNORMAL HIGH (ref 70–99)
Glucose-Capillary: 144 mg/dL — ABNORMAL HIGH (ref 70–99)
Glucose-Capillary: 148 mg/dL — ABNORMAL HIGH (ref 70–99)
Glucose-Capillary: 151 mg/dL — ABNORMAL HIGH (ref 70–99)
Glucose-Capillary: 152 mg/dL — ABNORMAL HIGH (ref 70–99)
Glucose-Capillary: 155 mg/dL — ABNORMAL HIGH (ref 70–99)
Glucose-Capillary: 159 mg/dL — ABNORMAL HIGH (ref 70–99)
Glucose-Capillary: 159 mg/dL — ABNORMAL HIGH (ref 70–99)
Glucose-Capillary: 167 mg/dL — ABNORMAL HIGH (ref 70–99)
Glucose-Capillary: 173 mg/dL — ABNORMAL HIGH (ref 70–99)
Glucose-Capillary: 174 mg/dL — ABNORMAL HIGH (ref 70–99)

## 2014-06-07 LAB — PREPARE FRESH FROZEN PLASMA

## 2014-06-07 LAB — POCT I-STAT 3, ART BLOOD GAS (G3+)
Acid-base deficit: 2 mmol/L (ref 0.0–2.0)
Bicarbonate: 24 mEq/L (ref 20.0–24.0)
Bicarbonate: 26.2 mEq/L — ABNORMAL HIGH (ref 20.0–24.0)
O2 Saturation: 92 %
O2 Saturation: 93 %
Patient temperature: 36.8
Patient temperature: 37
TCO2: 25 mmol/L (ref 0–100)
TCO2: 28 mmol/L (ref 0–100)
pCO2 arterial: 44.9 mmHg (ref 35.0–45.0)
pCO2 arterial: 50.7 mmHg — ABNORMAL HIGH (ref 35.0–45.0)
pH, Arterial: 7.32 — ABNORMAL LOW (ref 7.350–7.450)
pH, Arterial: 7.337 — ABNORMAL LOW (ref 7.350–7.450)
pO2, Arterial: 69 mmHg — ABNORMAL LOW (ref 80.0–100.0)
pO2, Arterial: 72 mmHg — ABNORMAL LOW (ref 80.0–100.0)

## 2014-06-07 LAB — CBC
HCT: 29.5 % — ABNORMAL LOW (ref 39.0–52.0)
HEMATOCRIT: 30.4 % — AB (ref 39.0–52.0)
HEMOGLOBIN: 10 g/dL — AB (ref 13.0–17.0)
Hemoglobin: 9.7 g/dL — ABNORMAL LOW (ref 13.0–17.0)
MCH: 28.7 pg (ref 26.0–34.0)
MCH: 28.7 pg (ref 26.0–34.0)
MCHC: 32.9 g/dL (ref 30.0–36.0)
MCHC: 32.9 g/dL (ref 30.0–36.0)
MCV: 87.1 fL (ref 78.0–100.0)
MCV: 87.3 fL (ref 78.0–100.0)
PLATELETS: 169 10*3/uL (ref 150–400)
Platelets: 169 10*3/uL (ref 150–400)
RBC: 3.49 MIL/uL — AB (ref 4.22–5.81)
RBC: 3.38 MIL/uL — AB (ref 4.22–5.81)
RDW: 13 % (ref 11.5–15.5)
RDW: 13.1 % (ref 11.5–15.5)
WBC: 14.8 10*3/uL — AB (ref 4.0–10.5)
WBC: 14.8 10*3/uL — ABNORMAL HIGH (ref 4.0–10.5)

## 2014-06-07 LAB — POCT I-STAT, CHEM 8
BUN: 20 mg/dL (ref 6–23)
Calcium, Ion: 1.14 mmol/L (ref 1.13–1.30)
Chloride: 100 mEq/L (ref 96–112)
Creatinine, Ser: 1.2 mg/dL (ref 0.50–1.35)
Glucose, Bld: 153 mg/dL — ABNORMAL HIGH (ref 70–99)
HCT: 29 % — ABNORMAL LOW (ref 39.0–52.0)
Hemoglobin: 9.9 g/dL — ABNORMAL LOW (ref 13.0–17.0)
Potassium: 4.3 mEq/L (ref 3.7–5.3)
Sodium: 135 mEq/L — ABNORMAL LOW (ref 137–147)
TCO2: 25 mmol/L (ref 0–100)

## 2014-06-07 LAB — PREPARE PLATELET PHERESIS: Unit division: 0

## 2014-06-07 LAB — BASIC METABOLIC PANEL
Anion gap: 10 (ref 5–15)
BUN: 21 mg/dL (ref 6–23)
CO2: 25 meq/L (ref 19–32)
Calcium: 7.9 mg/dL — ABNORMAL LOW (ref 8.4–10.5)
Chloride: 106 mEq/L (ref 96–112)
Creatinine, Ser: 1.11 mg/dL (ref 0.50–1.35)
GFR calc non Af Amer: 65 mL/min — ABNORMAL LOW (ref >90)
GFR, EST AFRICAN AMERICAN: 75 mL/min — AB (ref >90)
GLUCOSE: 116 mg/dL — AB (ref 70–99)
POTASSIUM: 4.7 meq/L (ref 3.7–5.3)
Sodium: 141 mEq/L (ref 137–147)

## 2014-06-07 LAB — CREATININE, SERUM
CREATININE: 1.18 mg/dL (ref 0.50–1.35)
GFR calc Af Amer: 70 mL/min — ABNORMAL LOW (ref >90)
GFR calc non Af Amer: 60 mL/min — ABNORMAL LOW (ref >90)

## 2014-06-07 LAB — MAGNESIUM
Magnesium: 2.5 mg/dL (ref 1.5–2.5)
Magnesium: 2.3 mg/dL (ref 1.5–2.5)

## 2014-06-07 IMAGING — CR DG CHEST 1V PORT
1 series · 1 of 1 positions shown · non-contrast
Comparison: Portable chest x-ray of [DATE]

CLINICAL DATA: Postop CABG, followup

EXAM:
PORTABLE CHEST - 1 VIEW

[AP]
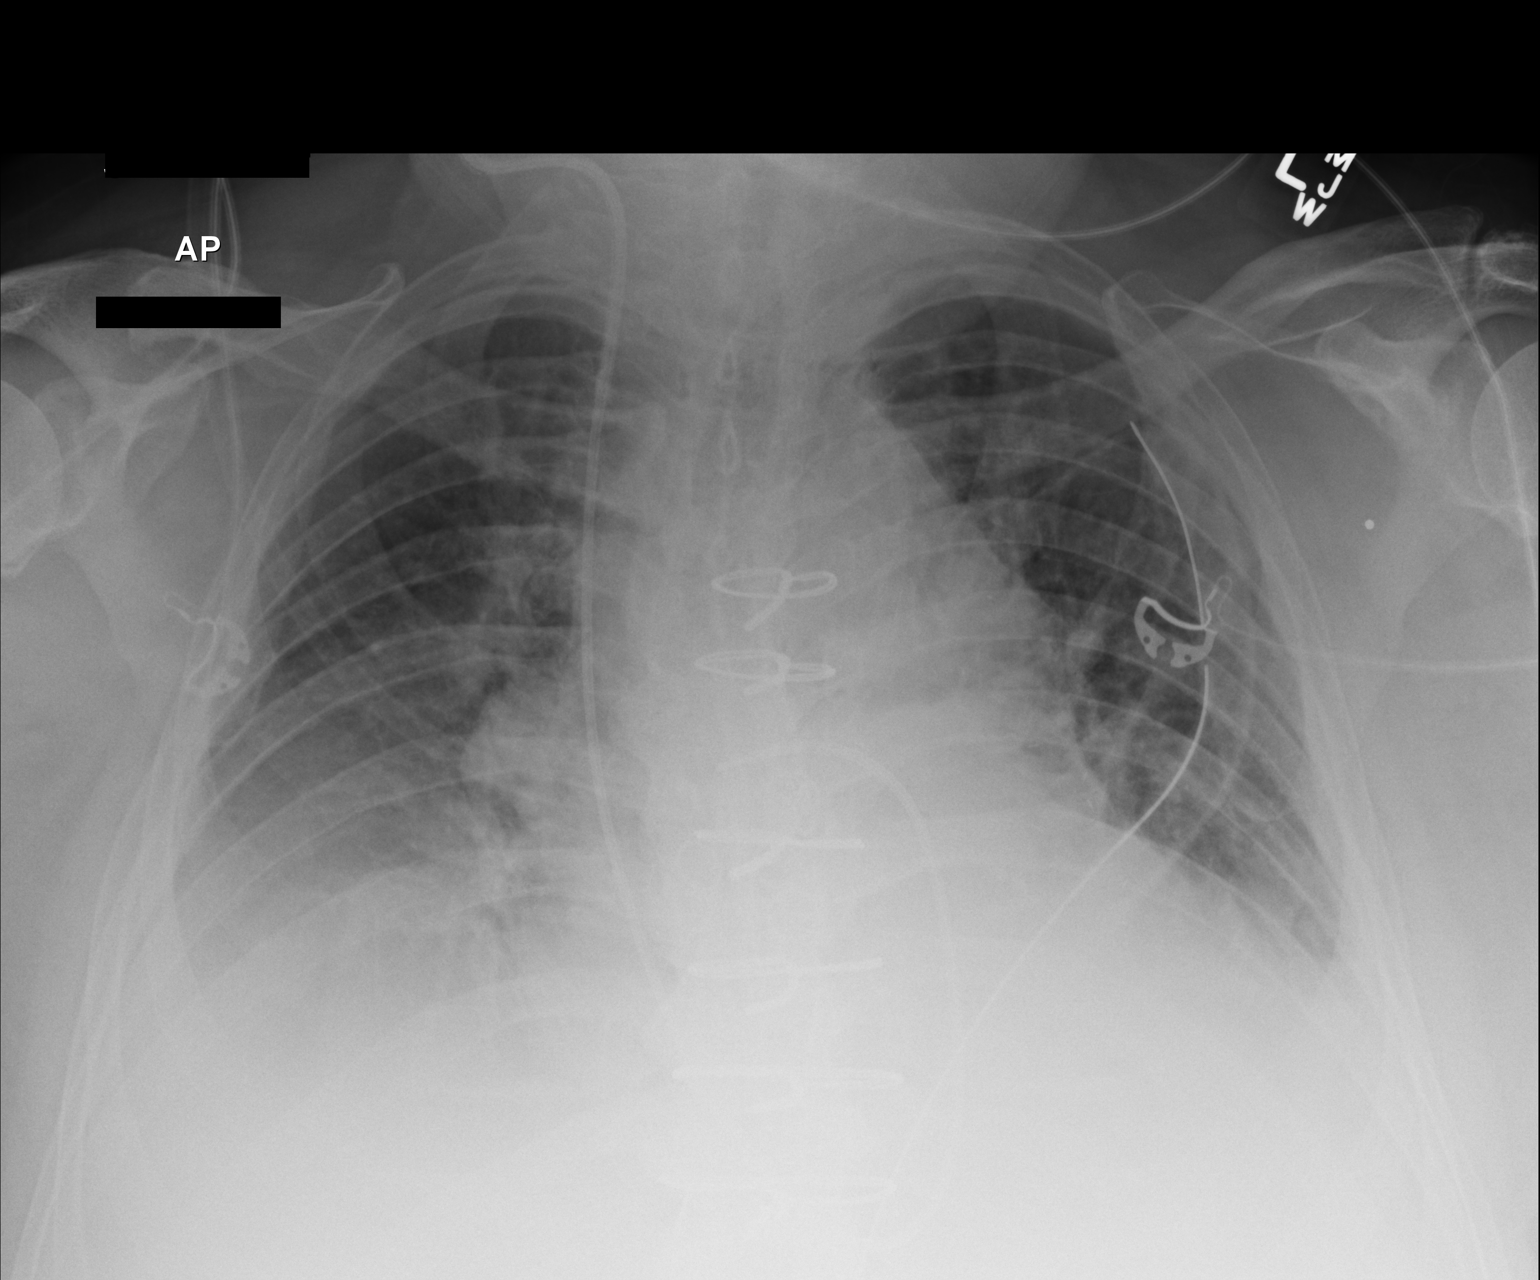

[1 of 1 positions shown; findings below may reference images not displayed]

FINDINGS: The endotracheal tube has been removed. There is little change in
poor aeration with basilar atelectasis and bilateral pleural
effusions remaining. Cardiomegaly is stable. Swan-Ganz catheter is
unchanged in position, and a left chest tube remains. No
pneumothorax is seen.
IMPRESSION: Endotracheal tube removed. Little change in basilar atelectasis and
effusions.

## 2014-06-07 MED ORDER — INSULIN DETEMIR 100 UNIT/ML ~~LOC~~ SOLN
20.0000 [IU] | Freq: Every day | SUBCUTANEOUS | Status: DC
  Administered 2014-06-08 – 2014-06-11 (×4): 20 [IU] via SUBCUTANEOUS
  Filled 2014-06-07 (×7): qty 0.2

## 2014-06-07 MED ORDER — FUROSEMIDE 10 MG/ML IJ SOLN: 20.0000 mg | Freq: Two times a day (BID) | INTRAMUSCULAR | Status: DC

## 2014-06-07 MED ORDER — KETOROLAC TROMETHAMINE 15 MG/ML IJ SOLN
15.0000 mg | Freq: Four times a day (QID) | INTRAMUSCULAR | Status: AC
  Administered 2014-06-07 (×3): 15 mg via INTRAVENOUS
  Filled 2014-06-07 (×3): qty 1

## 2014-06-07 MED ORDER — FUROSEMIDE 10 MG/ML IJ SOLN
40.0000 mg | Freq: Two times a day (BID) | INTRAMUSCULAR | Status: DC
  Administered 2014-06-07 – 2014-06-12 (×11): 40 mg via INTRAVENOUS
  Filled 2014-06-07 (×15): qty 4

## 2014-06-07 MED ORDER — LEVALBUTEROL HCL 0.63 MG/3ML IN NEBU
0.6300 mg | INHALATION_SOLUTION | Freq: Three times a day (TID) | RESPIRATORY_TRACT | Status: DC
Start: 2014-06-07 — End: 2014-06-08
  Administered 2014-06-07 – 2014-06-08 (×4): 0.63 mg via RESPIRATORY_TRACT
  Filled 2014-06-07 (×7): qty 3

## 2014-06-07 MED ORDER — INSULIN DETEMIR 100 UNIT/ML ~~LOC~~ SOLN
20.0000 [IU] | Freq: Once | SUBCUTANEOUS | Status: AC
  Administered 2014-06-07: 20 [IU] via SUBCUTANEOUS
  Filled 2014-06-07: qty 0.2

## 2014-06-07 MED ORDER — ENOXAPARIN SODIUM 40 MG/0.4ML ~~LOC~~ SOLN
40.0000 mg | Freq: Every day | SUBCUTANEOUS | Status: DC
  Administered 2014-06-07 – 2014-06-08 (×2): 40 mg via SUBCUTANEOUS
  Filled 2014-06-07 (×3): qty 0.4

## 2014-06-07 MED ORDER — INSULIN ASPART 100 UNIT/ML ~~LOC~~ SOLN
0.0000 [IU] | SUBCUTANEOUS | Status: DC
  Administered 2014-06-07: 2 [IU] via SUBCUTANEOUS
  Administered 2014-06-07 (×2): 4 [IU] via SUBCUTANEOUS
  Administered 2014-06-08 (×2): 2 [IU] via SUBCUTANEOUS
  Administered 2014-06-08: 8 [IU] via SUBCUTANEOUS
  Administered 2014-06-08 (×2): 4 [IU] via SUBCUTANEOUS
  Administered 2014-06-08: 8 [IU] via SUBCUTANEOUS
  Administered 2014-06-09 (×2): 2 [IU] via SUBCUTANEOUS

## 2014-06-07 MED FILL — Mannitol IV Soln 20%: INTRAVENOUS | Qty: 500 | Status: AC

## 2014-06-07 MED FILL — Heparin Sodium (Porcine) Inj 1000 Unit/ML: INTRAMUSCULAR | Qty: 10 | Status: AC

## 2014-06-07 MED FILL — Lidocaine HCl IV Inj 20 MG/ML: INTRAVENOUS | Qty: 5 | Status: AC

## 2014-06-07 MED FILL — Sodium Bicarbonate IV Soln 8.4%: INTRAVENOUS | Qty: 50 | Status: AC

## 2014-06-07 MED FILL — Electrolyte-R (PH 7.4) Solution: INTRAVENOUS | Qty: 1000 | Status: AC

## 2014-06-07 MED FILL — Sodium Chloride IV Soln 0.9%: INTRAVENOUS | Qty: 2000 | Status: AC

## 2014-06-07 NOTE — Progress Notes (Addendum)
      [address]       [place] [address]             [phone]      1 Day Post-Op Procedure(s) (LRB): CORONARY ARTERY BYPASS GRAFTING (CABG) x 5 USING LEFT AND RIGHT SAPHENOUS LEG VEIN HARVESTED ENDOSCOPICALLY (N/A) INTRAOPERATIVE TRANSESOPHAGEAL ECHOCARDIOGRAM (N/A)  Subjective:  [name] [name] complains of pain this morning.  He is coughing and expectorating some sputum.  Objective: Vital signs in last 24 hours: Temp:  [97.5 F (36.4 C)-99 F (37.2 C)] 98.4 F (36.9 C) (10/14 0800) Pulse Rate:  [47-132] 93 (10/14 0800) Cardiac Rhythm:  [-] Normal sinus rhythm (10/14 0800) Resp:  [12-21] 18 (10/14 0800) BP: (84-144)/(46-59) 118/58 mmHg (10/14 0800) SpO2:  [90 %-99 %] 95 % (10/14 0800) Arterial Line BP: (98-188)/(41-109) 144/51 mmHg (10/14 0800) FiO2 (%):  [40 %-100 %] 40 % (10/13 2110) Weight:  [303 lb 2.1 oz (137.5 kg)] 303 lb 2.1 oz (137.5 kg) (10/14 0500)  Hemodynamic parameters for last 24 hours: PAP: (40-49)/(22-28) 41/22 mmHg CO:  [5.3 L/min-7.3 L/min] 5.3 L/min CI:  [2.1 L/min/m2-2.9 L/min/m2] 2.1 L/min/m2  Intake/Output from previous day: 10/13 0701 - 10/14 0700 In: 6781.5 [I.V.:4271.5; Blood:1030; NG/GT:30; IV Piggyback:1450] Out: 4890 [Urine:3620; Blood:950; Chest Tube:320] Intake/Output this shift: Total I/O In: 21.5 [I.V.:21.5] Out: -   General appearance: alert, cooperative and no distress Heart: regular rate and rhythm Lungs: clear to auscultation bilaterally Abdomen: soft, non-tender; bowel sounds normal; no masses,  no organomegaly Extremities: edema 1+ Wound: clean and dry, aquacel on sternotomy  Lab Results:  Recent Labs  06/06/14 2115 06/06/14 2120 06/07/14 0400  WBC 14.9*  --  14.8*  HGB 10.3* 10.2* 10.0*  HCT 30.9* 30.0* 30.4*  PLT 170  --  169   BMET:  Recent Labs  06/06/14 2120 06/07/14 0400  NA 138 141  K 4.7 4.7  CL 104 106  CO2  --  25  GLUCOSE 158* 116*  BUN 20 21  CREATININE 1.00 1.11    CALCIUM  --  7.9*    PT/INR:  Recent Labs  06/06/14 1500  LABPROT 19.9*  INR 1.69*   ABG    Component Value Date/Time   PHART 7.320* 06/07/2014 0408   HCO3 26.2* 06/07/2014 0408   TCO2 28 06/07/2014 0408   ACIDBASEDEF 2.0 06/07/2014 0038   O2SAT 93.0 06/07/2014 0408   CBG (last 3)   Recent Labs  06/06/14 2200 06/06/14 2257 06/07/14 0003  GLUCAP 167* 159* 144*    Assessment/Plan: S/P Procedure(s) (LRB): CORONARY ARTERY BYPASS GRAFTING (CABG) x 5 USING LEFT AND RIGHT SAPHENOUS LEG VEIN HARVESTED ENDOSCOPICALLY (N/A) INTRAOPERATIVE TRANSESOPHAGEAL ECHOCARDIOGRAM (N/A)  1. CV- A.Fib post operatively, currently NSR, off pacer- wean Amiodarone, Dopamine, and Milrinone as tolerated 2. Pulm- wean oxygen as tolerated, productive cough, start nebs, encouraged use of IS 3. Renal- creatinine mildly elevated at 1.11, + hypervolemia weight is up about 20 lbs, will start Lasix 4. Expected Acute Blood Loss Anemia- Hgb 10.0, will monitor 5. DM- remains on insulin drip, will wean, start Levemir 6. Dispo- patient with pain, POD #1 progression orders, patient will likely require a lot of encouragement to get out of bed   LOS: 1 day    [name], [name] 06/07/2014  PT consult toradol 3 doses, start reglan OOB to chair Wean dopamine- cont milrinone  patient examined and medical record reviewed,agree with above note. [name],[name] 06/07/2014

## 2014-06-07 NOTE — Progress Notes (Signed)
POD # 1 CABG  Pain control remains an issue  BP 118/57  Pulse 88  Temp(Src) 97.9 F (36.6 C) (Oral)  Resp 18  Ht 6' 0.05" (1.83 m)  Wt 303 lb 2.1 oz (137.5 kg)  BMI 41.06 kg/m2  SpO2 96%   Intake/Output Summary (Last 24 hours) at 06/07/14 1814 Last data filed at 06/07/14 1800  Gross per 24 hour  Intake 1820.52 ml  Output   2690 ml  Net -869.48 ml   K= 4.3 Hct = 29  Doing well POD #1

## 2014-06-08 ENCOUNTER — Inpatient Hospital Stay (HOSPITAL_COMMUNITY): Payer: Medicare PPO

## 2014-06-08 LAB — CARBOXYHEMOGLOBIN
Carboxyhemoglobin: 1.3 % (ref 0.5–1.5)
Methemoglobin: 0.5 % (ref 0.0–1.5)
O2 Saturation: 52 %
Total hemoglobin: 9.9 g/dL — ABNORMAL LOW (ref 13.5–18.0)

## 2014-06-08 LAB — GLUCOSE, CAPILLARY
Glucose-Capillary: 173 mg/dL — ABNORMAL HIGH (ref 70–99)
Glucose-Capillary: 170 mg/dL — ABNORMAL HIGH (ref 70–99)
Glucose-Capillary: 203 mg/dL — ABNORMAL HIGH (ref 70–99)
Glucose-Capillary: 135 mg/dL — ABNORMAL HIGH (ref 70–99)
Glucose-Capillary: 214 mg/dL — ABNORMAL HIGH (ref 70–99)

## 2014-06-08 LAB — BASIC METABOLIC PANEL
Anion gap: 11 (ref 5–15)
Anion gap: 10 (ref 5–15)
BUN: 26 mg/dL — ABNORMAL HIGH (ref 6–23)
BUN: 30 mg/dL — ABNORMAL HIGH (ref 6–23)
CALCIUM: 7.9 mg/dL — AB (ref 8.4–10.5)
CO2: 25 meq/L (ref 19–32)
CO2: 27 mEq/L (ref 19–32)
Calcium: 8 mg/dL — ABNORMAL LOW (ref 8.4–10.5)
Chloride: 100 mEq/L (ref 96–112)
Chloride: 97 mEq/L (ref 96–112)
Creatinine, Ser: 1.56 mg/dL — ABNORMAL HIGH (ref 0.50–1.35)
Creatinine, Ser: 1.59 mg/dL — ABNORMAL HIGH (ref 0.50–1.35)
GFR calc Af Amer: 49 mL/min — ABNORMAL LOW (ref >90)
GFR calc non Af Amer: 42 mL/min — ABNORMAL LOW (ref >90)
GFR, EST AFRICAN AMERICAN: 50 mL/min — AB (ref >90)
GFR, EST NON AFRICAN AMERICAN: 43 mL/min — AB (ref >90)
Glucose, Bld: 177 mg/dL — ABNORMAL HIGH (ref 70–99)
Glucose, Bld: 152 mg/dL — ABNORMAL HIGH (ref 70–99)
Potassium: 5 mEq/L (ref 3.7–5.3)
Potassium: 4.8 mEq/L (ref 3.7–5.3)
Sodium: 136 mEq/L — ABNORMAL LOW (ref 137–147)
Sodium: 134 mEq/L — ABNORMAL LOW (ref 137–147)

## 2014-06-08 LAB — CBC
HCT: 27.7 % — ABNORMAL LOW (ref 39.0–52.0)
Hemoglobin: 9 g/dL — ABNORMAL LOW (ref 13.0–17.0)
MCH: 28.6 pg (ref 26.0–34.0)
MCHC: 32.5 g/dL (ref 30.0–36.0)
MCV: 87.9 fL (ref 78.0–100.0)
PLATELETS: 178 10*3/uL (ref 150–400)
RBC: 3.15 MIL/uL — ABNORMAL LOW (ref 4.22–5.81)
RDW: 13.3 % (ref 11.5–15.5)
WBC: 12 10*3/uL — AB (ref 4.0–10.5)

## 2014-06-08 LAB — CK TOTAL AND CKMB (NOT AT ARMC)
CK, MB: 6.2 ng/mL (ref 0.3–4.0)
Relative Index: 1.6 (ref 0.0–2.5)
Total CK: 391 U/L — ABNORMAL HIGH (ref 7–232)

## 2014-06-08 IMAGING — CR DG CHEST 1V PORT
1 series · 1 of 1 positions shown · non-contrast
Comparison: the previous day's study

CLINICAL DATA: postop cardiac surgery

EXAM:
PORTABLE CHEST - 1 VIEW

[AP]
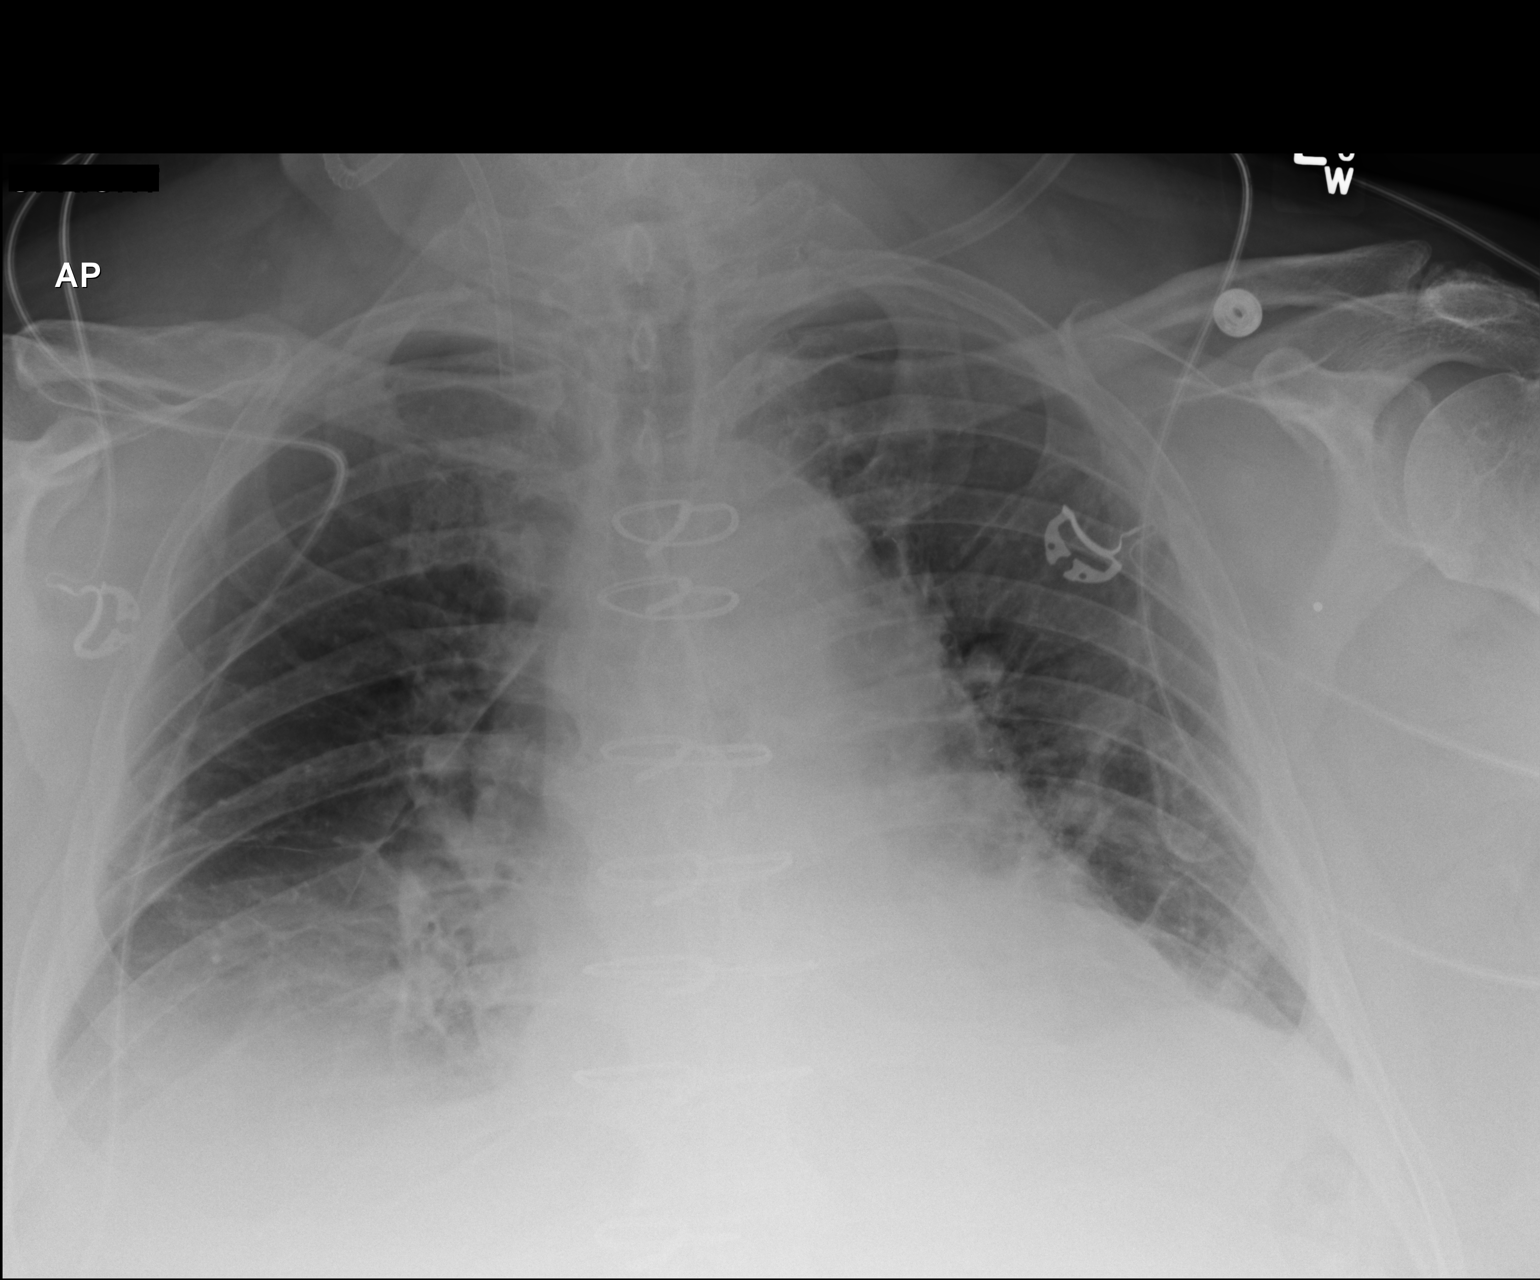

[1 of 1 positions shown; findings below may reference images not displayed]

FINDINGS: The left chest tube has been removed with no pneumothorax evident.
Swan-Ganz catheter has been retracted leaving the right IJ venous
sheath in place. Previous CABG. Heart size upper limits normal.
Improved aeration with residual patchy perihilar and bibasilar
subsegmental atelectasis or infiltrate, left greater than right.
No definite effusion.
Visualized skeletal structures are unremarkable.
IMPRESSION: 1. Left chest tube and LUISZANDRO removal.  No pneumothorax.
2. Persistent bibasilar atelectasis

## 2014-06-08 MED ORDER — GABAPENTIN 300 MG PO CAPS
300.0000 mg | ORAL_CAPSULE | Freq: Three times a day (TID) | ORAL | Status: DC
  Administered 2014-06-08 – 2014-06-14 (×19): 300 mg via ORAL
  Filled 2014-06-08 (×23): qty 1

## 2014-06-08 MED ORDER — CETYLPYRIDINIUM CHLORIDE 0.05 % MT LIQD
7.0000 mL | Freq: Two times a day (BID) | OROMUCOSAL | Status: DC
  Administered 2014-06-09 – 2014-06-13 (×7): 7 mL via OROMUCOSAL

## 2014-06-08 MED ORDER — INFLUENZA VAC SPLIT QUAD 0.5 ML IM SUSY
0.5000 mL | PREFILLED_SYRINGE | INTRAMUSCULAR | Status: AC
  Administered 2014-06-11: 0.5 mL via INTRAMUSCULAR
  Filled 2014-06-08: qty 0.5

## 2014-06-08 MED ORDER — ALUM & MAG HYDROXIDE-SIMETH 200-200-20 MG/5ML PO SUSP
15.0000 mL | Freq: Four times a day (QID) | ORAL | Status: DC | PRN
  Administered 2014-06-08: 15 mL via ORAL
  Filled 2014-06-08: qty 30

## 2014-06-08 MED ORDER — AMIODARONE HCL 200 MG PO TABS
400.0000 mg | ORAL_TABLET | Freq: Two times a day (BID) | ORAL | Status: DC
  Administered 2014-06-08 – 2014-06-14 (×13): 400 mg via ORAL
  Filled 2014-06-08 (×18): qty 2

## 2014-06-08 MED ORDER — LEVALBUTEROL HCL 0.63 MG/3ML IN NEBU
0.6300 mg | INHALATION_SOLUTION | Freq: Four times a day (QID) | RESPIRATORY_TRACT | Status: DC | PRN
  Administered 2014-06-09 – 2014-06-12 (×5): 0.63 mg via RESPIRATORY_TRACT
  Filled 2014-06-08 (×5): qty 3

## 2014-06-08 NOTE — Evaluation (Signed)
Physical Therapy Evaluation Patient Details Name: [name] [name] MRN: [id number] DOB: 05/16/1941 Today's Date: 06/08/2014   History of Present Illness  Adm 06/06/14 for CABG x5 (using bil saphenous veins). PMHx- CHF with EF 25%, DM with neuropathy feet, CAD with recent MI, gout, AAA, Rt rotator cuff repair,   Clinical Impression  Patient is s/p CABG surgery resulting in functional limitations due to the deficits listed below (see PT Problem List). Pt has prepared to have his girlfriend stay with him on return home. Patient will benefit from skilled PT to increase their independence and safety with mobility to allow discharge to the venue listed below.       Follow Up Recommendations Home health PT;Supervision/Assistance - 24 hour    Equipment Recommendations  Rolling walker with 5" wheels (pt hopeful he will not need a RW)    Recommendations for Other Services OT consult     Precautions / Restrictions Precautions Precautions: Sternal;Fall Precaution Comments: educated on sternal precautions      Mobility  Bed Mobility Overal bed mobility: Needs Assistance;+2 for physical assistance Bed Mobility: Rolling;Sit to Sidelying Rolling: Min assist       Sit to sidelying: Mod assist;+2 for physical assistance General bed mobility comments: pt OOB on arrival; returned to bed after ambulation; pt with difficulty performing sit to side (began rotating shoulders towards supine prior to raising his legs up and could not complete task without assist)  Transfers Overall transfer level: Needs assistance Equipment used: Pushed w/c Transfers: Sit to/from Stand Sit to Stand: Min assist;+2 physical assistance         General transfer comment: vc for maintaining sternal precautions; assist for anterior weight-shift  Ambulation/Gait Ambulation/Gait assistance: Min assist;+2 physical assistance Ambulation Distance (Feet): 400 Feet Assistive device:  (push w/C) Gait  Pattern/deviations: Step-through pattern;Decreased stride length;Wide base of support;Trunk flexed Gait velocity: decr   General Gait Details: vc for upright posture and proximity to w/c (UE support)  Stairs            Wheelchair Mobility    Modified Rankin (Stroke Patients Only)       Balance Overall balance assessment: No apparent balance deficits (not formally assessed)                                           Pertinent Vitals/Pain Pain Assessment: No/denies pain (denied throughout session)    Home Living Family/patient expects to be discharged to:: Private residence Living Arrangements: Alone Available Help at Discharge: Friend(s);Available 24 hours/day (Girlfriend to stay with him, per pt) Type of Home: House Home Access: Stairs to enter Entrance Stairs-Rails: Right Entrance Stairs-Number of Steps: 3 Home Layout: One level Home Equipment: Walker - standard;Cane - single point      Prior Function Level of Independence: Independent with assistive device(s)         Comments: 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     Hand Dominance        Extremity/Trunk Assessment   Upper Extremity Assessment: Overall WFL for tasks assessed (within 90 degrees flexion)           Lower Extremity Assessment: Overall WFL for tasks assessed      Cervical / Trunk Assessment: Normal  Communication   Communication: No difficulties  Cognition Arousal/Alertness: Awake/alert Behavior During Therapy: WFL for tasks assessed/performed Overall Cognitive Status: Within Functional Limits for tasks  assessed                      General Comments      Exercises        Assessment/Plan    PT Assessment Patient needs continued PT services  PT Diagnosis Difficulty walking;Acute pain   PT Problem List Decreased activity tolerance;Decreased mobility;Decreased knowledge of use of DME;Decreased knowledge of precautions;Obesity;Pain  PT  Treatment Interventions DME instruction;Gait training;Stair training;Functional mobility training;Therapeutic activities;Patient/family education   PT Goals (Current goals can be found in the Care Plan section) Acute Rehab PT Goals Patient Stated Goal: return to walking without a device PT Goal Formulation: With patient Time For Goal Achievement: 06/15/14 Potential to Achieve Goals: Good    Frequency Min 3X/week   Barriers to discharge        Co-evaluation               End of Session Equipment Utilized During Treatment: Gait belt;Oxygen Activity Tolerance: Patient tolerated treatment well Patient left: in bed;with call bell/phone within reach Nurse Communication: Mobility status         Time: 0312-8118 PT Time Calculation (min): 24 min   Charges:   PT Evaluation $Initial PT Evaluation Tier I: 1 Procedure PT Treatments $Gait Training: 8-22 mins   PT G Codes:          Mackenize Delgadillo 2014-06-15, 12:14 PM Pager [phone]

## 2014-06-08 NOTE — Progress Notes (Addendum)
2 Days Post-Op Procedure(s) (LRB): CORONARY ARTERY BYPASS GRAFTING (CABG) x 5 USING LEFT AND RIGHT SAPHENOUS LEG VEIN HARVESTED ENDOSCOPICALLY (N/A) INTRAOPERATIVE TRANSESOPHAGEAL ECHOCARDIOGRAM (N/A) Subjective: Feels better with less pain OOB to recliner Creat up to 1.6 Wt still 303lbs CXR with better lung volumes  Objective: Vital signs in last 24 hours: Temp:  [97.6 F (36.4 C)-98.4 F (36.9 C)] 97.8 F (36.6 C) (10/15 0730) Pulse Rate:  [80-100] 82 (10/15 0745) Cardiac Rhythm:  [-] Normal sinus rhythm (10/15 0745) Resp:  [7-20] 11 (10/15 0745) BP: (77-130)/(40-68) 94/42 mmHg (10/15 0745) SpO2:  [93 %-99 %] 95 % (10/15 0745) Arterial Line BP: (135-158)/(47-57) 145/57 mmHg (10/14 1300) Weight:  [303 lb 9.2 oz (137.7 kg)] 303 lb 9.2 oz (137.7 kg) (10/15 0600)  Hemodynamic parameters for last 24 hours: PAP: (41-43)/(22-26) 42/26 mmHg  Intake/Output from previous day: 10/14 0701 - 10/15 0700 In: 1616.6 [P.O.:150; I.V.:766.6; IV Piggyback:700] Out: 2040 [Urine:1810; Chest Tube:230] Intake/Output this shift:    extrem warm abd soft Breath sounds diminished Neuro intact  Lab Results:  Recent Labs  06/07/14 1700 06/07/14 1736 06/08/14 0420  WBC 14.8*  --  12.0*  HGB 9.7* 9.9* 9.0*  HCT 29.5* 29.0* 27.7*  PLT 169  --  178   BMET:  Recent Labs  06/07/14 0400  06/07/14 1736 06/08/14 0420  NA 141  --  135* 136*  K 4.7  --  4.3 5.0  CL 106  --  100 100  CO2 25  --   --  25  GLUCOSE 116*  --  153* 177*  BUN 21  --  20 26*  CREATININE 1.11  < > 1.20 1.56*  CALCIUM 7.9*  --   --  7.9*  < > = values in this interval not displayed.  PT/INR:  Recent Labs  06/06/14 1500  LABPROT 19.9*  INR 1.69*   ABG    Component Value Date/Time   PHART 7.320* 06/07/2014 0408   HCO3 26.2* 06/07/2014 0408   TCO2 25 06/07/2014 1736   ACIDBASEDEF 2.0 06/07/2014 0038   O2SAT 93.0 06/07/2014 0408   CBG (last 3)   Recent Labs  06/07/14 1922 06/07/14 2332  06/08/14 0331  GLUCAP 174* 173* 173*    Assessment/Plan: S/P Procedure(s) (LRB): CORONARY ARTERY BYPASS GRAFTING (CABG) x 5 USING LEFT AND RIGHT SAPHENOUS LEG VEIN HARVESTED ENDOSCOPICALLY (N/A) INTRAOPERATIVE TRANSESOPHAGEAL ECHOCARDIOGRAM (N/A) d/c tubes/lines Acute renal failure -- creat 1.6, BUN 28 Keep foley for rising creat, low urine output  LOS: 2 days    [name] [name],[name] [name] 06/08/2014

## 2014-06-08 NOTE — Anesthesia Postprocedure Evaluation (Signed)
  Anesthesia Post-op Note  Patient: [name] [name]  Procedure(s) Performed: Procedure(s): CORONARY ARTERY BYPASS GRAFTING (CABG) x 5 USING LEFT AND RIGHT SAPHENOUS LEG VEIN HARVESTED ENDOSCOPICALLY (N/A) INTRAOPERATIVE TRANSESOPHAGEAL ECHOCARDIOGRAM (N/A)  Patient Location: PACU and SICU  Anesthesia Type:General  Level of Consciousness: sedated  Airway and Oxygen Therapy: Patient remains intubated per anesthesia plan  Post-op Pain: mild  Post-op Assessment: Post-op Vital signs reviewed  Post-op Vital Signs: Reviewed  Last Vitals:  Filed Vitals:   06/08/14 1100  BP: 114/45  Pulse: 85  Temp:   Resp: 14    Complications: No apparent anesthesia complications

## 2014-06-08 NOTE — Progress Notes (Signed)
EKG CRITICAL VALUE     12 lead EKG performed.  Critical value noted. Darnelle Going, RN notified.   Basir Niven H, CCT 06/08/2014 8:12 AM

## 2014-06-08 NOTE — Care Management Note (Addendum)
    Page 1 of 1   06/14/2014     3:43:39 PM CARE MANAGEMENT NOTE 06/14/2014  Patient:  [name] [name], [name] [name]   Account Number:  [id number]  Date Initiated:  06/08/2014  Documentation initiated by:  MAYO,HENRIETTA  Subjective/Objective Assessment:   s/p CABG x 5; lives alone  Pt has supportive GF, but not sure she can be there 24/7     Action/Plan:   PT recommending SNF at dc; CSW consulted to facilitate dc to SNF for rehab.   Anticipated DC Date:  06/14/2014   Anticipated DC Plan:  SKILLED NURSING FACILITY  In-house referral  Clinical Social Worker      DC Planning Services  CM consult      Choice offered to / List presented to:             Status of service:  Completed, signed off Medicare Important Message given?  YES (If response is "NO", the following Medicare IM given date fields will be blank) Date Medicare IM given:  06/12/2014 Medicare IM given by:  Treacy Holcomb Date Additional Medicare IM given:   Additional Medicare IM given by:    Discharge Disposition:  SKILLED NURSING FACILITY  Per UR Regulation:  Reviewed for med. necessity/level of care/duration of stay  If discussed at Long Length of Stay Meetings, dates discussed:   06/13/2014    Comments:  06/14/14 Sidney Ace, RN, BSN [phone] Pt discharged to [facility] today, per CSW arrangements.  06/08/14 1100 Henrietta Mayo RN MSN BSN CCM Pt states he will have 24/7 assistance when discharged, has supportive sister and brother as well as significant other. PT eval pending.

## 2014-06-08 NOTE — Clinical Documentation Improvement (Signed)
Please clarify renal status. Thank you.   Abnormal Lab and/or Testing Results:  BUN/CR/GFR 10/13 = 20/1.0/70 10/14 @ 0400 = 21/1.11/65; @1736  = 20/1.20/60; @ 0420 = 26/1.56/43  Per 10/15: Keep foley for rising creat, low urine output.  Treatment provided: Monitoring BMP Monitoring I&O  Possible Clinical Conditions: Acute renal failure Acute on chronic renal failure Chronic renal failure Unable to determine Other  Thank you, Artice Bergerson T. Luiz Ochoa, MSN, MBA/MHA Clinical Documentation Specialist [email or web address] Office # [phone]

## 2014-06-08 NOTE — Progress Notes (Signed)
MD paged regarding critical EKG, showing ST elevation. MD made aware, labs ordered. Will obtain labs and continue to monitor pt.

## 2014-06-08 NOTE — Progress Notes (Signed)
Utilization review completed. Suzzane Quilter, RN, BSN. 

## 2014-06-08 NOTE — Progress Notes (Signed)
Inpatient Diabetes Program Recommendations  AACE/ADA: New Consensus Statement on Inpatient Glycemic Control (2013)  Target Ranges:  Prepandial:   less than 140 mg/dL      Peak postprandial:   less than 180 mg/dL (1-2 hours)      Critically ill patients:  140 - 180 mg/dL   Reason for assessment; elevated CBG  Diabetes history: Type 2 Outpatient Diabetes medications: Metfromin 1000mg  bid Current orders for Inpatient glycemic control: Levemir 20 units per day, Novolog correction 0-10 units 3x/day with meals  If CBG elevated after meals, may want to consider adding 4 units Novolog ac meals and Novolog correction qhs 0-5 units along with Novolog correction currently ordered.  Susette Racer, RN, BA, MHA, CDE Diabetes Coordinator Inpatient Diabetes Program  [phone] (Team Pager) [phone] [facility] Office) 06/08/2014 11:06 AM

## 2014-06-08 NOTE — Progress Notes (Signed)
Pt instructed to really push use on the IS due to atelectasis on CXR. Pt taking fast inhalation and instructed to take slow deep breath during inhalation for proper use and maximum benefit of IS. RN aware of push to use.

## 2014-06-08 NOTE — Progress Notes (Signed)
Patient ID: [name] [name], male   DOB: 26-Dec-1940, 73 y.o.   MRN: [id number]  SICU Evening rounds:  Hemodynamically stable  Only complaint is some indigestion after dinner.  Urine output good. Still has a lot of leg edema. Continue diuresis.  BMET    Component Value Date/Time   NA 134* 06/08/2014 1553   NA 136 05/25/2014 0926   K 4.8 06/08/2014 1553   CL 97 06/08/2014 1553   CO2 27 06/08/2014 1553   GLUCOSE 152* 06/08/2014 1553   GLUCOSE 122* 05/25/2014 0926   BUN 30* 06/08/2014 1553   BUN 34* 05/25/2014 0926   CREATININE 1.59* 06/08/2014 1553   CALCIUM 8.0* 06/08/2014 1553   GFRNONAA 42* 06/08/2014 1553   GFRAA 49* 06/08/2014 1553

## 2014-06-08 NOTE — Progress Notes (Signed)
CRITICAL VALUE ALERT  Critical value received:  [facility] 6.2  Date of notification:  06/08/2014    Time of notification:  0910  Critical value read back:Yes.    Nurse who received alert:  Darnelle Going   MD notified (1st page):  Dr. Zenaida Niece trigt   Time of first page:  [phone]  Responding MD:  Dr. Donata Clay   Time MD responded:  [phone]

## 2014-06-09 ENCOUNTER — Inpatient Hospital Stay (HOSPITAL_COMMUNITY): Payer: Medicare PPO

## 2014-06-09 LAB — CBC
HCT: 26.1 % — ABNORMAL LOW (ref 39.0–52.0)
HCT: 23.7 % — ABNORMAL LOW (ref 39.0–52.0)
Hemoglobin: 8.6 g/dL — ABNORMAL LOW (ref 13.0–17.0)
Hemoglobin: 7.9 g/dL — ABNORMAL LOW (ref 13.0–17.0)
MCH: 29.6 pg (ref 26.0–34.0)
MCH: 28.8 pg (ref 26.0–34.0)
MCHC: 33 g/dL (ref 30.0–36.0)
MCHC: 33.3 g/dL (ref 30.0–36.0)
MCV: 89.7 fL (ref 78.0–100.0)
MCV: 86.5 fL (ref 78.0–100.0)
Platelets: 148 10*3/uL — ABNORMAL LOW (ref 150–400)
Platelets: 174 10*3/uL (ref 150–400)
RBC: 2.91 MIL/uL — ABNORMAL LOW (ref 4.22–5.81)
RBC: 2.74 MIL/uL — ABNORMAL LOW (ref 4.22–5.81)
RDW: 13.4 % (ref 11.5–15.5)
RDW: 13.2 % (ref 11.5–15.5)
WBC: 9.8 10*3/uL (ref 4.0–10.5)
WBC: 11 10*3/uL — ABNORMAL HIGH (ref 4.0–10.5)

## 2014-06-09 LAB — GLUCOSE, CAPILLARY
Glucose-Capillary: 115 mg/dL — ABNORMAL HIGH (ref 70–99)
Glucose-Capillary: 116 mg/dL — ABNORMAL HIGH (ref 70–99)
Glucose-Capillary: 128 mg/dL — ABNORMAL HIGH (ref 70–99)
Glucose-Capillary: 129 mg/dL — ABNORMAL HIGH (ref 70–99)
Glucose-Capillary: 145 mg/dL — ABNORMAL HIGH (ref 70–99)
Glucose-Capillary: 152 mg/dL — ABNORMAL HIGH (ref 70–99)

## 2014-06-09 LAB — BASIC METABOLIC PANEL
Anion gap: 9 (ref 5–15)
BUN: 31 mg/dL — ABNORMAL HIGH (ref 6–23)
CO2: 26 mEq/L (ref 19–32)
Calcium: 8.1 mg/dL — ABNORMAL LOW (ref 8.4–10.5)
Chloride: 99 mEq/L (ref 96–112)
Creatinine, Ser: 1.35 mg/dL (ref 0.50–1.35)
GFR calc Af Amer: 59 mL/min — ABNORMAL LOW (ref >90)
GFR calc non Af Amer: 51 mL/min — ABNORMAL LOW (ref >90)
Glucose, Bld: 130 mg/dL — ABNORMAL HIGH (ref 70–99)
Potassium: 4.7 mEq/L (ref 3.7–5.3)
Sodium: 134 mEq/L — ABNORMAL LOW (ref 137–147)

## 2014-06-09 LAB — PREPARE RBC (CROSSMATCH)

## 2014-06-09 IMAGING — CR DG CHEST 1V PORT
1 series · 1 of 1 positions shown · non-contrast
Comparison: [DATE]

CLINICAL DATA: Coronary artery disease and status post CABG.

EXAM:
PORTABLE CHEST - 1 VIEW

[portable]
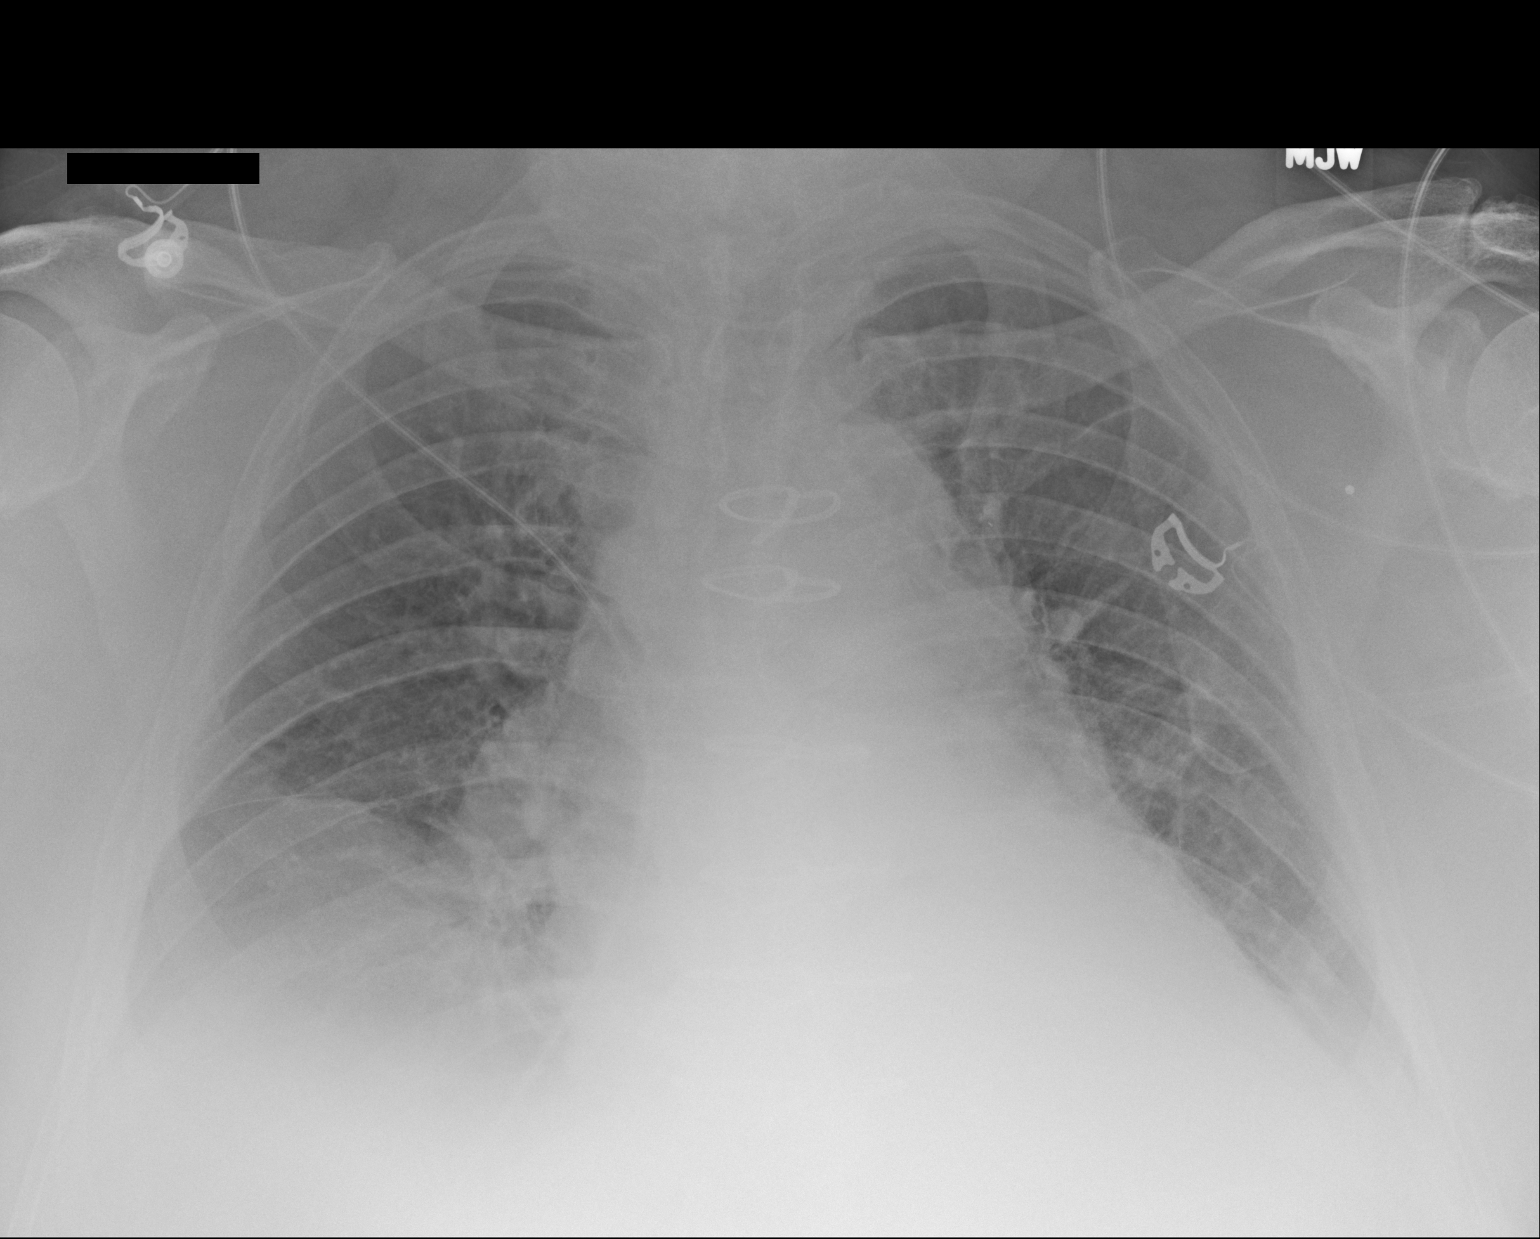

[1 of 1 positions shown; findings below may reference images not displayed]

FINDINGS: The right jugular central line has been removed. Negative for
pneumothorax. There are bibasilar densities and this has progressed
at the left lung base. Heart size remains upper limits of normal.
Negative for a pneumothorax. No evidence for pulmonary edema.
IMPRESSION: Bibasilar densities are most compatible with pleural fluid and
atelectasis. Slightly increased left basilar densities compared to
the prior examination.

## 2014-06-09 MED ORDER — GUAIFENESIN-DM 100-10 MG/5ML PO SYRP
15.0000 mL | ORAL_SOLUTION | ORAL | Status: DC | PRN
  Filled 2014-06-09: qty 15

## 2014-06-09 MED ORDER — LACTULOSE 10 GM/15ML PO SOLN
30.0000 g | Freq: Every day | ORAL | Status: AC
  Administered 2014-06-09 – 2014-06-10 (×2): 30 g via ORAL
  Filled 2014-06-09 (×2): qty 45

## 2014-06-09 MED ORDER — SODIUM CHLORIDE 0.9 % IV SOLN: 250.0000 mL | INTRAVENOUS | Status: DC | PRN

## 2014-06-09 MED ORDER — SODIUM CHLORIDE 0.9 % IJ SOLN
10.0000 mL | Freq: Two times a day (BID) | INTRAMUSCULAR | Status: DC
  Administered 2014-06-09 – 2014-06-12 (×3): 10 mL
  Administered 2014-06-14: 30 mL

## 2014-06-09 MED ORDER — ENOXAPARIN SODIUM 40 MG/0.4ML ~~LOC~~ SOLN
40.0000 mg | Freq: Every day | SUBCUTANEOUS | Status: DC
  Administered 2014-06-09 – 2014-06-13 (×5): 40 mg via SUBCUTANEOUS
  Filled 2014-06-09 (×6): qty 0.4

## 2014-06-09 MED ORDER — SODIUM CHLORIDE 0.9 % IJ SOLN
3.0000 mL | Freq: Two times a day (BID) | INTRAMUSCULAR | Status: DC
  Administered 2014-06-09: 3 mL via INTRAVENOUS
  Administered 2014-06-12: 10 mL via INTRAVENOUS

## 2014-06-09 MED ORDER — LISINOPRIL 10 MG PO TABS
10.0000 mg | ORAL_TABLET | Freq: Every day | ORAL | Status: DC
  Administered 2014-06-09 – 2014-06-11 (×3): 10 mg via ORAL
  Filled 2014-06-09 (×5): qty 1

## 2014-06-09 MED ORDER — SODIUM CHLORIDE 0.9 % IJ SOLN
3.0000 mL | INTRAMUSCULAR | Status: DC | PRN
  Administered 2014-06-09: 3 mL via INTRAVENOUS

## 2014-06-09 MED ORDER — MAGNESIUM HYDROXIDE 400 MG/5ML PO SUSP
30.0000 mL | Freq: Every day | ORAL | Status: DC | PRN
Start: 2014-06-09 — End: 2014-06-14

## 2014-06-09 MED ORDER — INSULIN ASPART 100 UNIT/ML ~~LOC~~ SOLN
0.0000 [IU] | Freq: Three times a day (TID) | SUBCUTANEOUS | Status: DC
Start: 2014-06-09 — End: 2014-06-14
  Administered 2014-06-10 – 2014-06-13 (×6): 2 [IU] via SUBCUTANEOUS

## 2014-06-09 MED ORDER — SODIUM CHLORIDE 0.9 % IJ SOLN
10.0000 mL | INTRAMUSCULAR | Status: DC | PRN
  Administered 2014-06-09 – 2014-06-11 (×2): 20 mL
  Administered 2014-06-12 – 2014-06-13 (×2): 10 mL
  Administered 2014-06-13 – 2014-06-14 (×2): 20 mL

## 2014-06-09 MED ORDER — MOVING RIGHT ALONG BOOK
Freq: Once | Status: AC
  Administered 2014-06-09: 09:00:00
  Filled 2014-06-09: qty 1

## 2014-06-09 NOTE — Discharge Summary (Signed)
[address]       [place] [address]             [phone]              Discharge Summary  Name: [name] [name] DOB: 01-22-1941 73 y.o. MRN: [id number]   Admission Date: 06/06/2014 Discharge Date: 06/14/2014    Admitting Diagnosis: Coronary artery disease Moderate mitral regurgitation   Discharge Diagnosis:  Coronary artery disease Trace mitral regurgitation Postoperative atrial fibrillation Expected postoperative blood loss anemia  Past Medical History  Diagnosis Date  . Hypertension   . Hyperlipidemia   . Gout   . Diabetes mellitus without complication   . Nephrolithiasis   . Umbilical hernia   . Epigastric hernia   . Obesity   . CAD (coronary artery disease)     a. 03/2014 NSTEMI/Cath: LM nl, LAD 92m - hazy, D1 90p, D2 100, D3 min irregs, LCX 58m, OM2 95, OM3 90 small, RCA 62m-->pending further CABG eval.  . Chronic systolic CHF (congestive heart failure)     a. EF 25-30% by 2D ECHO 04/20/14. Severely dec global hypokinesis. mildly elevated RVSP  . AAA (abdominal aortic aneurysm)   . Ischemic cardiomyopathy     a. EF 25-30% 03/2014.  . Mitral regurgitation     a. 03/2014 TEE: mild to mod MR.  . Myocardial infarction   . Neuromuscular disorder     DIABETIC NEUROPATHY  . Arthritis      Procedures: CORONARY ARTERY BYPASS GRAFTING x 5 (Left internal mammary artery to left diagonal, saphenous vein graft to left anterior descending, saphenous vein graft to obtuse marginal 1, saphenous vein graft to obtuse marginal 2, saphenous vein great to posterior descending) ENDOSCOPIC VEIN HARVEST RIGHT LEG AND LEFT THIGH - 06/06/2014    HPI:  The patient is a 73 y.o. male who was recently admitted to [facility] in August 2015 with acute respiratory insufficiency and hypoxemia. In the preceding several days, he noticed increase in weight, significant leg swelling and abdominal swelling, orthopnea and PND. He required BiPAP by EMT to  maintain O2 sats greater than 80%. He was admitted to the hospital and found to have edema on chest x-ray with mildly positive cardiac enzymes. He was stabilized and underwent echocardiogram which was difficult to interpret because of poor images but he was felt to have EF of 25%. Coronary angiogram showed normal dominant right coronary, moderate stenosis of the proximal LAD, and high-grade stenosis of the OM 1 OM 2 branches of the circumflex which were small vessels. He underwent coronary angiography via the right radial artery. Ventriculogram was not performed due to the patient's renal insufficiency. Right heart catheterization was performed with PA pressures elevated at 60/40, wedge 18, LVEDP 24, RV pressure 58/19 and cardiac index 2.4. He was transferred to  [facility] for further evaluation and was seen in consultation by Dr. Donata Clay for consideration of CABG.  It was felt at the time that he was not a candidate for heart surgery due to his acute heart failure symptoms.  He underwent 2D echo which revealed moderate MR with severe biventricular dysfunction. He was ultimately discharged and has been followed by the Advanced Heart Failure clinic for medical optimization prior to surgery. He returned to see Dr. Donata Clay on 9/30 and his heart failure symptoms were improved. It was felt that he would benefit at this time from surgical revascularization with mitral valve repair. All risks, benefits and  alternatives of surgery were explained in detail, and the patient agreed to proceed.     Hospital Course:  The patient [facility] 06/06/2014. The patient was taken to the operating room and underwent the above procedure.  Intraoperative TEE showed only trace MR, therefore, no intervention was performed to the mitral valve.  The postoperative course has been notable for atrial fibrillation.  He was treated with Amiodarone and did convert to sinus rhythm. He also developed a mild postop renal  insufficiency, with peak creatinine of 1.6.  This has been treated conservatively and has trended back down to baseline.    He is overall progressing well.  The patient has been ambulating with physical therapy and is improving with mobility. Incisions are healing well.  He is tolerating a regular diet and is having normal bowel function.  He was started on diuretics for volume overload and is diuresing well.  Blood sugars have remained stable and he has been transitioned back onto his home oral diabetes medications.  It is felt that he will need short term rehab post-discharge at a skilled nursing facility.  He presently is medically stable for transfer once a bed becomes available.  We ask the SNF to please do the following: 1. Please obtain vital signs at least one time daily 2.Please weigh the patient daily. If he or she continues to gain weight or develops lower extremity edema, contact the office at [phone]. 3. Ambulate patient at least three times daily and please use sternal precautions.  Recent vital signs:  Filed Vitals:   06/14/14 0415  BP: 122/49  Pulse: 74  Temp: 97.8 F (36.6 C)  Resp: 20    Recent laboratory studies:  CBC:  Recent Labs  06/13/14 0625  WBC 12.1*  HGB 9.5*  HCT 29.5*  PLT 293   BMET:   Recent Labs  06/13/14 0625  NA 136*  K 4.6  CL 95*  CO2 30  GLUCOSE 126*  BUN 22  CREATININE 1.20  CALCIUM 9.0    PT/INR: No results found for this basename: LABPROT, INR,  in the last 72 hours   Discharge Medications:     Medication List    STOP taking these medications       ADVIL PM PO     aspirin 81 MG tablet  Replaced by:  aspirin 325 MG EC tablet     carvedilol 6.25 MG tablet  Commonly known as:  COREG     nitroGLYCERIN 0.4 MG SL tablet  Commonly known as:  NITROSTAT     simvastatin 40 MG tablet  Commonly known as:  ZOCOR      TAKE these medications       amiodarone 400 MG tablet  Commonly known as:  PACERONE  Take 1  tablet (400 mg total) by mouth 2 (two) times daily. For 2 days;then take Amiodarone 200 mg by mouth two times daily thereafter     aspirin 325 MG EC tablet  Take 1 tablet (325 mg total) by mouth daily.     atorvastatin 80 MG tablet  Commonly known as:  LIPITOR  Take 80 mg by mouth daily at 6 PM.     BLACK CHERRY CONCENTRATE PO  Take 1 capsule by mouth daily.     cholecalciferol 1000 UNITS tablet  Commonly known as:  VITAMIN D  Take 1,000 Units by mouth daily.     colchicine 0.6 MG tablet  Take 0.6 mg by mouth daily  as needed (for gout flareup).     cyanocobalamin 500 MCG tablet  Take 500 mcg by mouth daily.     furosemide 40 MG tablet  Commonly known as:  LASIX  Take 1 tablet (40 mg total) by mouth daily. For 2 weeks then stop.     gabapentin 300 MG capsule  Commonly known as:  NEURONTIN  Take 300 mg by mouth 4 (four) times daily.     insulin aspart 100 UNIT/ML injection  Commonly known as:  novoLOG  - Inject 0-24 Units into the skin 4 (four) times daily -  before meals and at bedtime. Units of Insulin  - CBG,120=0   CBG 251-300=12  - CBG 120-160=2  CBG 301-350=16  - CBG 161-200=4  CBG 351-400=20  - CBG  201-250=8 CBG>450=24 call MD     lisinopril 20 MG tablet  Commonly known as:  PRINIVIL,ZESTRIL  Take 20 mg by mouth every morning.     metFORMIN 1000 MG tablet  Commonly known as:  GLUCOPHAGE  Take 1 tablet (1,000 mg total) by mouth 2 (two) times daily with a meal.     metoprolol tartrate 12.5 mg Tabs tablet  Commonly known as:  LOPRESSOR  Take 0.5 tablets (12.5 mg total) by mouth 2 (two) times daily.     omeprazole 20 MG capsule  Commonly known as:  PRILOSEC  Take 20 mg by mouth every morning.     oxyCODONE 5 MG immediate release tablet  Commonly known as:  Oxy IR/ROXICODONE  Take 1-2 tablets (5-10 mg total) by mouth every 4 (four) hours as needed for severe pain.     Potassium Chloride ER 20 MEQ Tbcr  Take 20 mEq by mouth daily. For 2 weeks then stop.       simethicone 125 MG chewable tablet  Commonly known as:  MYLICON  Chew 125 mg by mouth daily.         Discharge Instructions:  The patient is to refrain from driving, heavy lifting or strenuous activity.  May shower daily and clean incisions with soap and water.  May resume regular diet.   Follow Up:   Follow-up Information   Follow up with [name], MD On 07/06/2014. (  Dr Kirke Corin 11/2 @10am  [facility]) )    Specialty:  Cardiology   Contact information:   [address] [place] [address] [phone]       Follow up with Mikey Bussing, MD On 07/19/2014. (Have a chest x-ray at [facility] at 10:00, then see MD at 11:00)    Specialty:  Cardiothoracic Surgery   Contact information:   [address] [place] [address] [phone]      The patient has been discharged on:  1.Beta Blocker: Yes [ x ]  No [ ]   If No, reason:    2.Ace Inhibitor/ARB: Yes [ x ]  No [  ]  If No, reason:    3.Statin: Yes [ x ]  No [ ]   If No, reason:    4.Ecasa: Yes [ x ]  No [ ]   If No, reason:   ZIMMERMAN,DONIELLE M PA-C 06/14/2014, 10:08 AM

## 2014-06-09 NOTE — Progress Notes (Addendum)
Patient being assisted from chair to [facility] when he started to panic that he was going to have a Bowel movement before we got him to the [facility].  Somehow wrapped around foley and foley was tugged on.  Large amount of blood noted to be in foley tubing once patient on Bedside commode.  Dr. Cornelius Moras in unit and came into see patient.  Foley removed and large amount of blood with clots came out.  Patient holding urinal.  Several large clots coming out at this time.  Dr. Donata Clay made aware in OR of above.  Order to not transfer patient.     1500 Bladder scan performed to see if patient was able to empty bladder.  No urine noted in bladder with scanner.  Dr. Donata Clay made aware.  Patient is able to urinate several times throughout day.  Will continue to monitor patient.

## 2014-06-09 NOTE — Progress Notes (Signed)
3 Days Post-Op Procedure(s) (LRB): CORONARY ARTERY BYPASS GRAFTING (CABG) x 5 USING LEFT AND RIGHT SAPHENOUS LEG VEIN HARVESTED ENDOSCOPICALLY (N/A) INTRAOPERATIVE TRANSESOPHAGEAL ECHOCARDIOGRAM (N/A) Subjective: Getting stronger,nsr on amiodarone Walked 300 ft diuresisng Objective: Vital signs in last 24 hours: Temp:  [97.7 F (36.5 C)-98.2 F (36.8 C)] 98 F (36.7 C) (10/16 0728) Pulse Rate:  [80-131] 83 (10/16 0700) Cardiac Rhythm:  [-] Normal sinus rhythm (10/16 0600) Resp:  [9-24] 18 (10/16 0700) BP: (84-132)/(40-102) 114/52 mmHg (10/16 0700) SpO2:  [91 %-100 %] 96 % (10/16 0700) Weight:  [304 lb 7.3 oz (138.1 kg)] 304 lb 7.3 oz (138.1 kg) (10/16 0600)  Hemodynamic parameters for last 24 hours:  stable  Intake/Output from previous day: 10/15 0701 - 10/16 0700 In: 1238.4 [P.O.:700; I.V.:538.4] Out: 2025 [Urine:2025] Intake/Output this shift:    Lungs clear abd soft Neuro intact  Lab Results:  Recent Labs  06/08/14 0420 06/09/14 0536  WBC 12.0* 9.8  HGB 9.0* 8.6*  HCT 27.7* 26.1*  PLT 178 148*   BMET:  Recent Labs  06/08/14 1553 06/09/14 0536  NA 134* 134*  K 4.8 4.7  CL 97 99  CO2 27 26  GLUCOSE 152* 130*  BUN 30* 31*  CREATININE 1.59* 1.35  CALCIUM 8.0* 8.1*    PT/INR:  Recent Labs  06/06/14 1500  LABPROT 19.9*  INR 1.69*   ABG    Component Value Date/Time   PHART 7.320* 06/07/2014 0408   HCO3 26.2* 06/07/2014 0408   TCO2 25 06/07/2014 1736   ACIDBASEDEF 2.0 06/07/2014 0038   O2SAT 52.0 06/08/2014 0907   CBG (last 3)   Recent Labs  06/08/14 1913 06/08/14 2341 06/09/14 0334  GLUCAP 214* 152* 128*    Assessment/Plan: S/P Procedure(s) (LRB): CORONARY ARTERY BYPASS GRAFTING (CABG) x 5 USING LEFT AND RIGHT SAPHENOUS LEG VEIN HARVESTED ENDOSCOPICALLY (N/A) INTRAOPERATIVE TRANSESOPHAGEAL ECHOCARDIOGRAM (N/A) Plan for transfer to step-down: see transfer orders Patient will need SNF  LOS: 3 days    VAN TRIGT  III,Koichi Platte 06/09/2014

## 2014-06-09 NOTE — Progress Notes (Signed)
Peripherally Inserted Central Catheter/Midline Placement  The IV Nurse has discussed with the patient and/or persons authorized to consent for the patient, the purpose of this procedure and the potential benefits and risks involved with this procedure.  The benefits include less needle sticks, lab draws from the catheter and patient may be discharged home with the catheter.  Risks include, but not limited to, infection, bleeding, blood clot (thrombus formation), and puncture of an artery; nerve damage and irregular heat beat.  Alternatives to this procedure were also discussed.  PICC/Midline Placement Documentation  PICC / Midline Double Lumen 06/09/14 PICC Right Basilic 41 cm 0 cm (Active)  Indication for Insertion or Continuance of Line Poor Vasculature-patient has had multiple peripheral attempts or PIVs lasting less than 24 hours 06/09/2014  4:39 PM  Exposed Catheter (cm) 0 cm 06/09/2014  4:39 PM  Lumen #1 Status Flushed;Saline locked;Blood return noted 06/09/2014  4:39 PM  Lumen #2 Status Flushed;Saline locked;Blood return noted 06/09/2014  4:39 PM  Dressing Change Due 06/16/14 06/09/2014  4:39 PM       Ethelda Chick 06/09/2014, 4:41 PM

## 2014-06-09 NOTE — Plan of Care (Signed)
Problem: Phase I - Pre-Op Goal: Point person for discharge identified Outcome: Completed/Met Date Met:  06/09/14 Home with family

## 2014-06-09 NOTE — Progress Notes (Signed)
Right IJ sleeve became difficult to flush and would not draw back. Began pulling in air from around the catheter. Therefore, sleeve was discontinued by RN. Large kink noted in catheter post-removal. Pt alert and oriented and VSS. Will continue to monitor pt.

## 2014-06-09 NOTE — Progress Notes (Signed)
Physical Therapy Treatment Patient Details Name: [name] [name] MRN: [id number] DOB: 01/26/1941 Today's Date: 06/09/2014    History of Present Illness Adm 06/06/14 for CABG x5 (using bil saphenous veins). PMHx- CHF with EF 25%, DM with neuropathy feet, CAD with recent MI, gout, AAA, Rt rotator cuff repair,     PT Comments    Pt progressing well with transfers with cues and assist. Pt only able to recall one sternal precaution and educated for all. MD concerned pt does not have 24 hr assist as caregivers have not stated availability and without assist at home will benefit from ST-SNF if assist arranged and pt achieve min assist all transfers then home may be a possibility. Will continue to follow and encouraged gait and HEP throughout day.   Follow Up Recommendations  SNF;Supervision/Assistance - 24 hour (pt states 24 hr assist but not confirmed)     Equipment Recommendations       Recommendations for Other Services       Precautions / Restrictions Precautions Precautions: Sternal;Fall Precaution Comments: educated on sternal precautions    Mobility  Bed Mobility               General bed mobility comments: Pt in recliner on arrival and states he does not sleep in the bed at home but in the recliner  Transfers Overall transfer level: Needs assistance   Transfers: Sit to/from Stand Sit to Stand: Min assist         General transfer comment: cues for reciprocal scooting, anterior translation and min assist to fully shift weight into standing  Ambulation/Gait Ambulation/Gait assistance: Min guard Ambulation Distance (Feet): 300 Feet Assistive device: Rolling walker (2 wheeled) Gait Pattern/deviations: Step-through pattern;Decreased stride length;Trunk flexed;Wide base of support   Gait velocity interpretation: Below normal speed for age/gender General Gait Details: cues for posture, position in RW, pursed lip breathing    Stairs            Wheelchair  Mobility    Modified Rankin (Stroke Patients Only)       Balance Overall balance assessment: Needs assistance   Sitting balance-Leahy Scale: Good       Standing balance-Leahy Scale: Fair                      Cognition Arousal/Alertness: Awake/alert Behavior During Therapy: WFL for tasks assessed/performed Overall Cognitive Status: Within Functional Limits for tasks assessed                      Exercises General Exercises - Lower Extremity Long Arc Quad: AROM;Seated;Both;15 reps Hip Flexion/Marching: AROM;Seated;Both;15 reps    General Comments        Pertinent Vitals/Pain Pain Assessment: 0-10 Pain Score: 3  Pain Location: sternum Pain Descriptors / Indicators: Aching Pain Intervention(s): Repositioned Initial BP 124/73, after 141/65 , HR 88-107 with gait sats 89-94% on RA with activity with cues for pursed lip breathing and 97% end of session at rest    Home Living                      Prior Function            PT Goals (current goals can now be found in the care plan section) Progress towards PT goals: Progressing toward goals    Frequency       PT Plan Discharge plan needs to be updated    Co-evaluation  End of Session Equipment Utilized During Treatment: Gait belt Activity Tolerance: Patient tolerated treatment well Patient left: in chair;with call bell/phone within reach     Time: 0741-0807 PT Time Calculation (min): 26 min  Charges:  $Gait Training: 8-22 mins $Therapeutic Exercise: 8-22 mins                    G Codes:      Delorse Lekabor, Kristyanna Barcelo Beth 06/09/2014, 10:09 AM Delaney MeigsMaija Tabor Damya Comley, PT [phone]

## 2014-06-10 ENCOUNTER — Inpatient Hospital Stay (HOSPITAL_COMMUNITY): Payer: Medicare PPO

## 2014-06-10 LAB — GLUCOSE, CAPILLARY
Glucose-Capillary: 111 mg/dL — ABNORMAL HIGH (ref 70–99)
Glucose-Capillary: 114 mg/dL — ABNORMAL HIGH (ref 70–99)
Glucose-Capillary: 130 mg/dL — ABNORMAL HIGH (ref 70–99)
Glucose-Capillary: 119 mg/dL — ABNORMAL HIGH (ref 70–99)

## 2014-06-10 LAB — TYPE AND SCREEN
ABO/RH(D): O POS
Antibody Screen: NEGATIVE
Unit division: 0
Unit division: 0

## 2014-06-10 LAB — CBC
HCT: 25.1 % — ABNORMAL LOW (ref 39.0–52.0)
Hemoglobin: 8.3 g/dL — ABNORMAL LOW (ref 13.0–17.0)
MCH: 28.7 pg (ref 26.0–34.0)
MCHC: 33.1 g/dL (ref 30.0–36.0)
MCV: 86.9 fL (ref 78.0–100.0)
Platelets: 197 10*3/uL (ref 150–400)
RBC: 2.89 MIL/uL — ABNORMAL LOW (ref 4.22–5.81)
RDW: 13.4 % (ref 11.5–15.5)
WBC: 10.6 10*3/uL — ABNORMAL HIGH (ref 4.0–10.5)

## 2014-06-10 LAB — BASIC METABOLIC PANEL
Anion gap: 10 (ref 5–15)
BUN: 28 mg/dL — ABNORMAL HIGH (ref 6–23)
CO2: 27 mEq/L (ref 19–32)
Calcium: 8.2 mg/dL — ABNORMAL LOW (ref 8.4–10.5)
Chloride: 98 mEq/L (ref 96–112)
Creatinine, Ser: 1.11 mg/dL (ref 0.50–1.35)
GFR calc Af Amer: 75 mL/min — ABNORMAL LOW (ref >90)
GFR calc non Af Amer: 65 mL/min — ABNORMAL LOW (ref >90)
Glucose, Bld: 116 mg/dL — ABNORMAL HIGH (ref 70–99)
Potassium: 4.4 mEq/L (ref 3.7–5.3)
Sodium: 135 mEq/L — ABNORMAL LOW (ref 137–147)

## 2014-06-10 IMAGING — CR DG CHEST 2V
2 series · 2 of 2 positions shown · non-contrast
Comparison: Single view of the chest [DATE] and [DATE].

CLINICAL DATA: Congestive heart failure.  History of CABG.

EXAM:
CHEST  2 VIEW

[w chest lat]
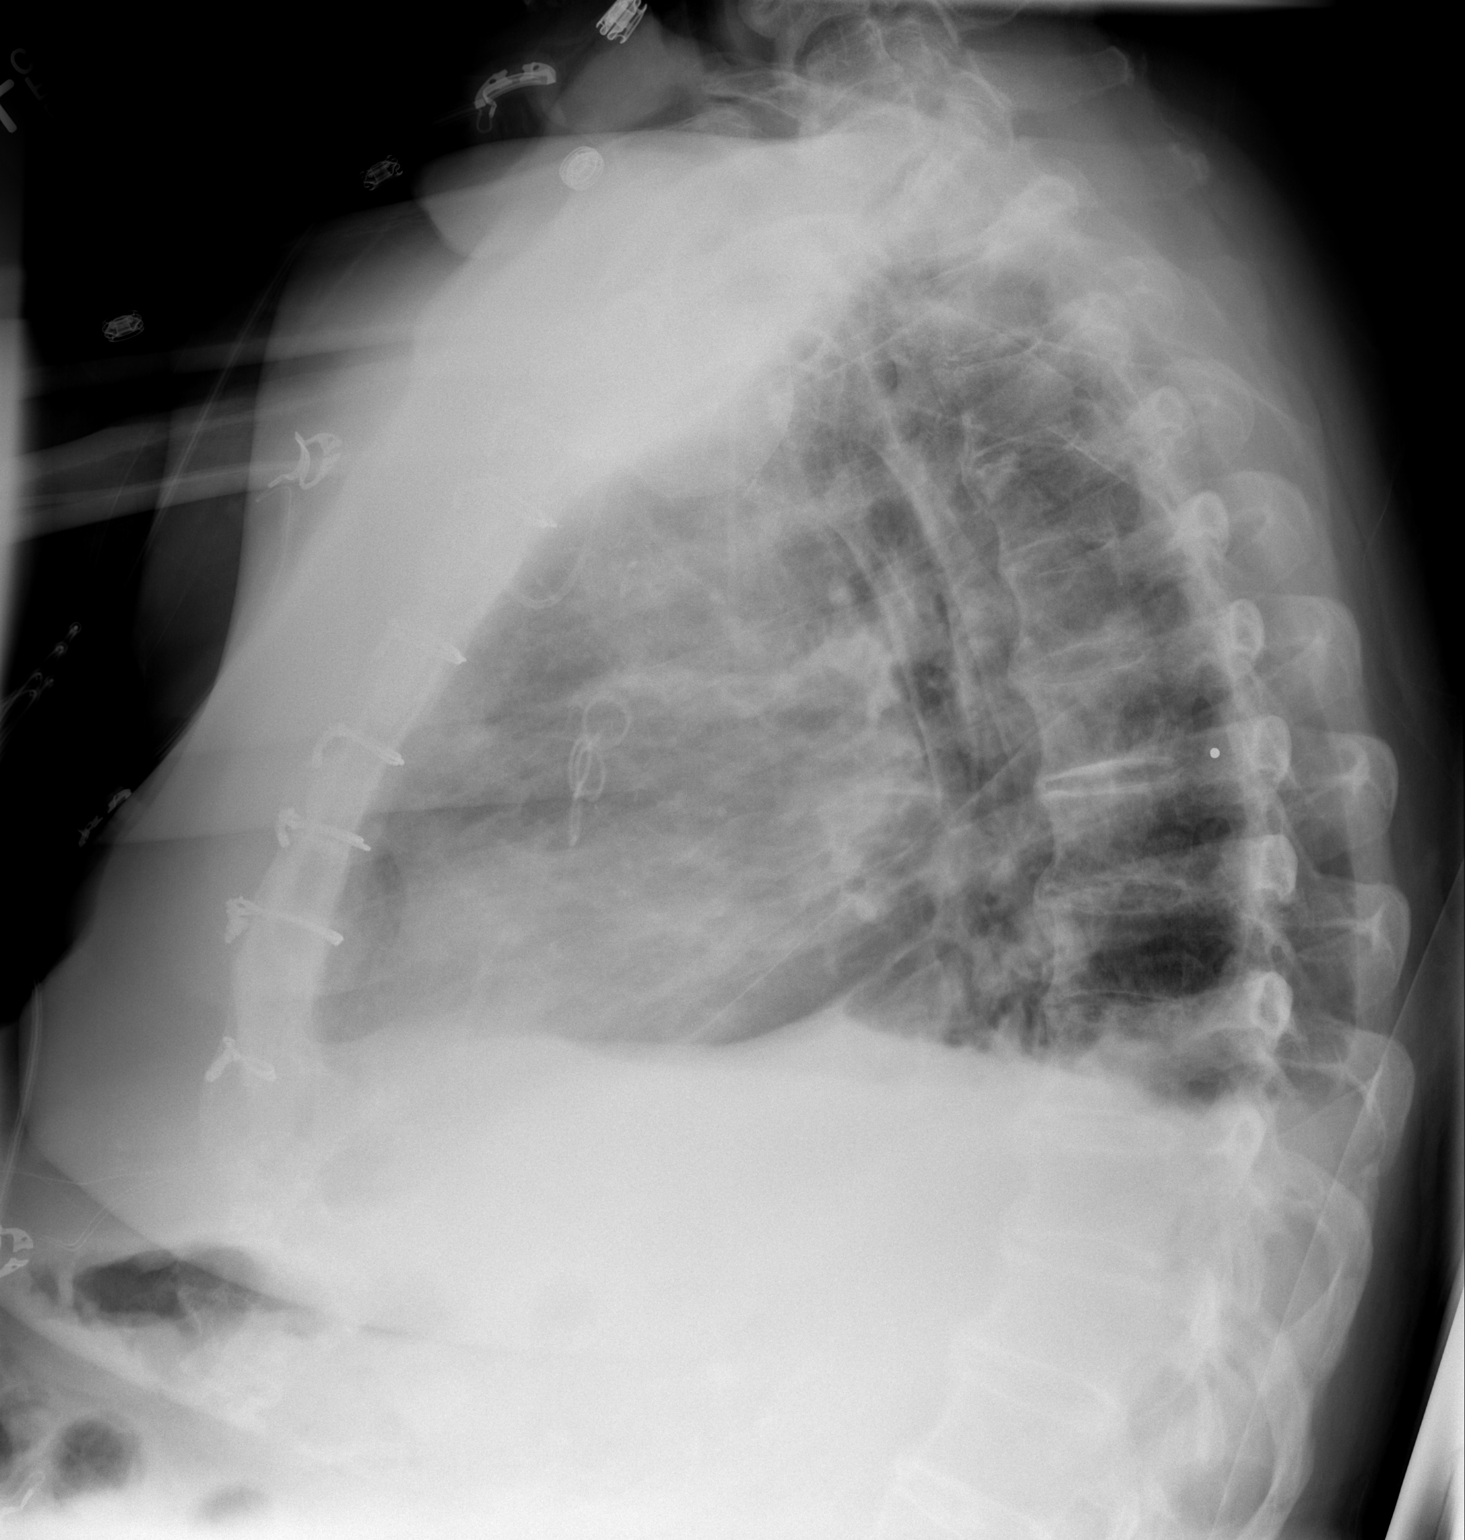

[w chest pa]
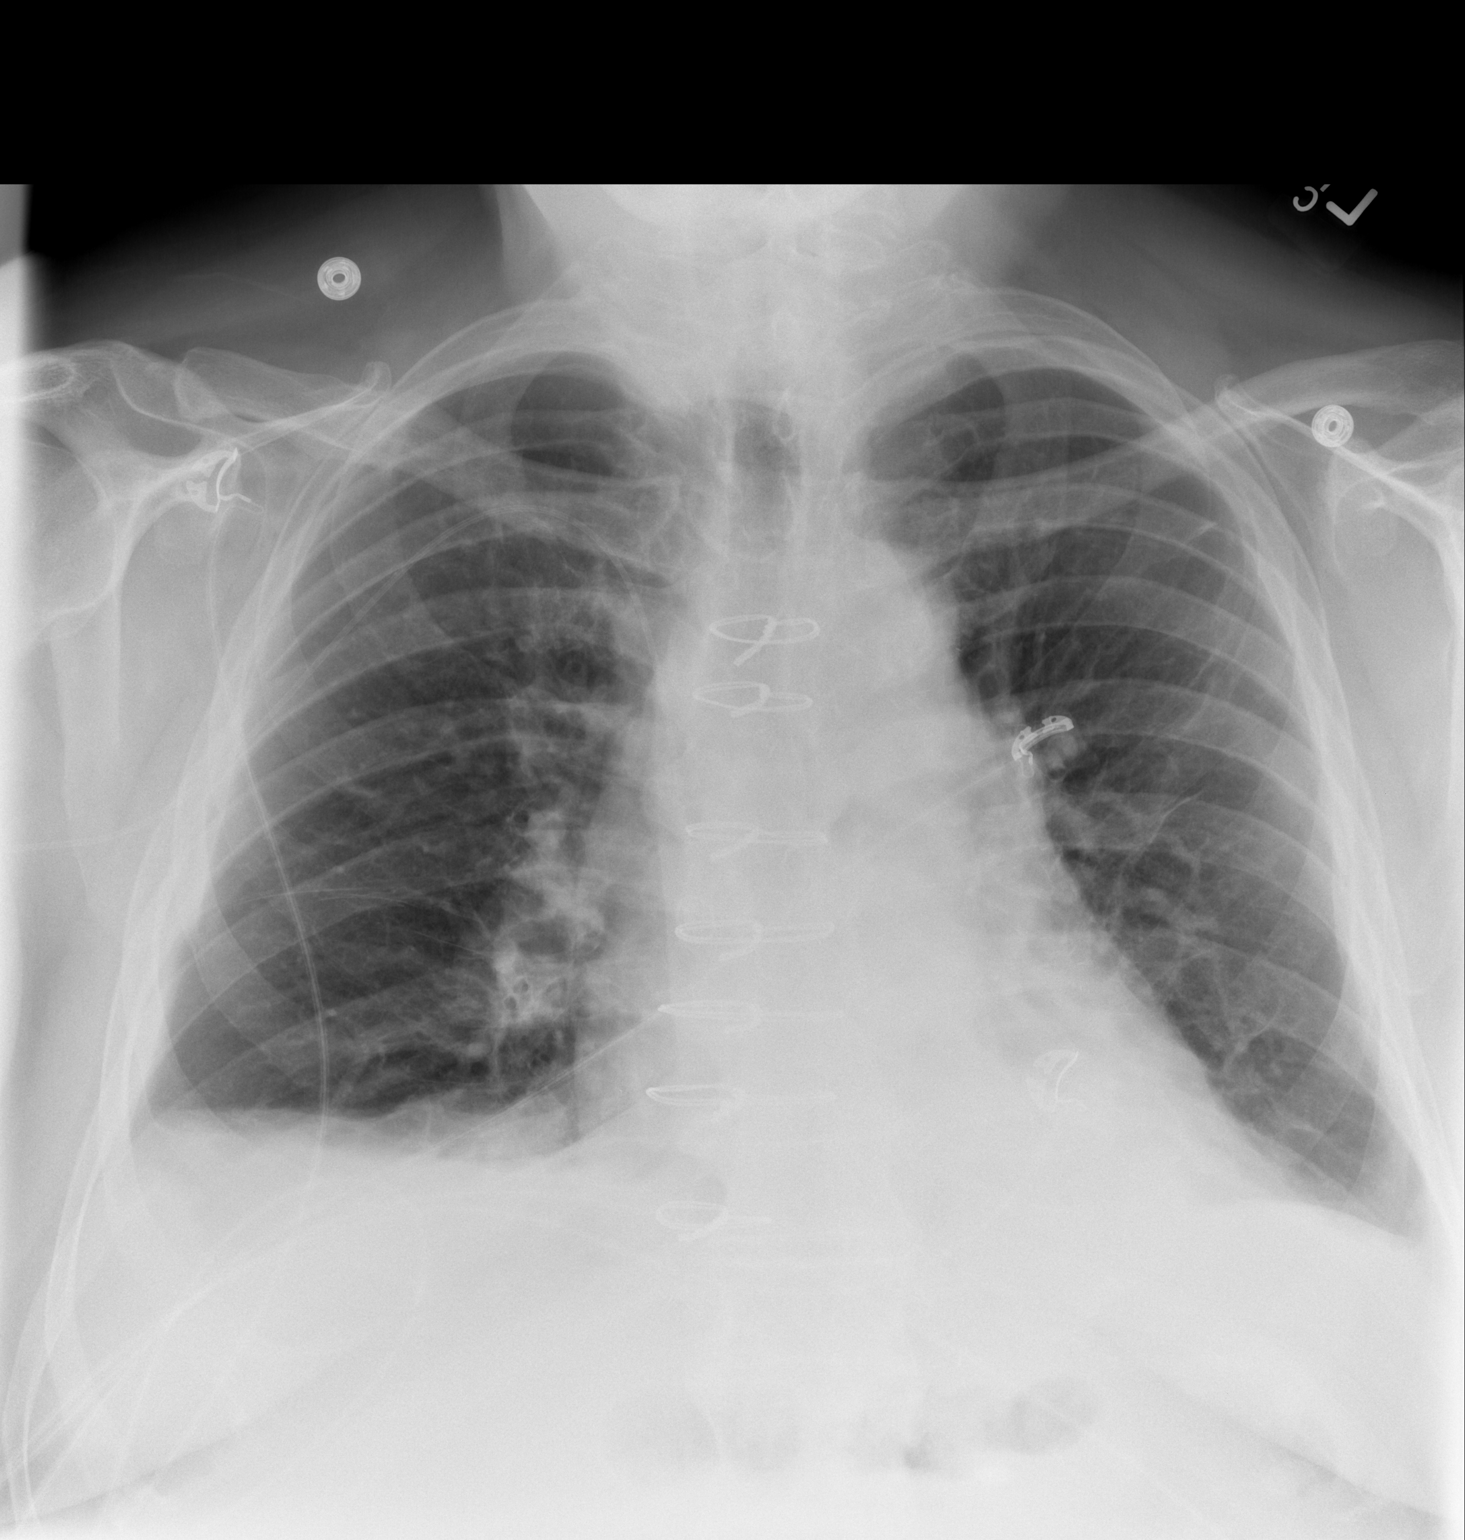

[2 of 2 positions shown; findings below may reference images not displayed]

FINDINGS: Right PICC remains in place, unchanged. Pulmonary edema appears
resolved. There are small bilateral pleural effusions and basilar
atelectasis, also improved.
IMPRESSION: Resolved pulmonary edema with small residual pleural effusions and
basilar atelectasis.

## 2014-06-10 MED ORDER — LACTULOSE 10 GM/15ML PO SOLN
20.0000 g | Freq: Every day | ORAL | Status: DC | PRN
  Administered 2014-06-11: 20 g via ORAL
  Filled 2014-06-10 (×2): qty 30

## 2014-06-10 NOTE — Progress Notes (Signed)
Patient notified RN of a blood clot that came out during urination. RN was asked to observe and reported back to me. Coral Ceo, PA made aware. Will continue to monitor closely. Lajuana Matte, RN

## 2014-06-10 NOTE — Progress Notes (Signed)
CARDIAC REHAB PHASE I   PRE:  Rate/Rhythm: 88 sinus  BP:  Supine:   Sitting: 114/58  Standing:    SaO2:   MODE:  Ambulation: 350 ft   POST:  Rate/Rhythem: 106 sinus tach  BP:  Supine:   Sitting: 122/99  Standing:    SaO2:   Order received and appreciated. Pt ambulated 350 ft with assist x1 using rolling walker.  Raised walker to more upright position.  Noted that pt seems to have a natural forward head posture.  Pt tolerated walk without complaint.  Pt was diaphoretic to touch after going to bathroom before walk.  Pt did complain of being fatigued upon return to room.  Pt seated at bedside for vital signs.  VSS.  Pt plans to take bath.  Pt did have clot in urine prior to walking, RN made aware.  Call bell in reach.  Encouraged to walk with staff over weekend.  We will f/u on Monday. Fabio Pierce, MA, ACSM RCEP [phone]  Hazle Nordmann

## 2014-06-10 NOTE — Progress Notes (Addendum)
[address]       [place] [id number]             [phone]          4 Days Post-Op Procedure(s) (LRB): CORONARY ARTERY BYPASS GRAFTING (CABG) x 5 USING LEFT AND RIGHT SAPHENOUS LEG VEIN HARVESTED ENDOSCOPICALLY (N/A) INTRAOPERATIVE TRANSESOPHAGEAL ECHOCARDIOGRAM (N/A)  Subjective: Feeling better today.  Breathing improved and wants to walk more.  C/o constipation.    Objective: Vital signs in last 24 hours: Patient Vitals for the past 24 hrs:  BP Temp Temp src Pulse Resp SpO2 Weight  06/10/14 0628 120/70 mmHg 97.6 F (36.4 C) Oral 88 15 94 % -  06/10/14 0500 124/56 mmHg - - 74 - 96 % 288 lb 5.8 oz (130.8 kg)  06/10/14 0400 110/51 mmHg 98 F (36.7 C) Oral 77 12 99 % -  06/10/14 0321 125/57 mmHg 97.8 F (36.6 C) Oral 78 14 98 % -  06/10/14 0300 122/62 mmHg - - 73 9 97 % -  06/10/14 0200 113/55 mmHg - - 74 11 98 % -  06/10/14 0100 115/58 mmHg - - 80 17 97 % -  06/10/14 0015 108/73 mmHg 97.9 F (36.6 C) Oral - 11 97 % -  06/10/14 0000 - 97.8 F (36.6 C) Oral - - - -  06/09/14 2345 109/56 mmHg 98.1 F (36.7 C) Oral - - - -  06/09/14 2300 118/56 mmHg - - - 15 97 % -  06/09/14 2200 130/52 mmHg - - - 16 99 % -  06/09/14 2100 110/64 mmHg - - - 18 96 % -  06/09/14 2000 116/57 mmHg 97.8 F (36.6 C) Oral 79 12 98 % -  06/09/14 1900 123/63 mmHg - - 83 16 97 % -  06/09/14 1800 130/69 mmHg - - 96 21 96 % -  06/09/14 1739 - - - - - 100 % -  06/09/14 1700 128/102 mmHg - - 86 13 100 % -  06/09/14 1600 117/80 mmHg - - 82 15 92 % -  06/09/14 1500 127/65 mmHg 97.4 F (36.3 C) Oral 86 19 100 % -  06/09/14 1400 114/65 mmHg - - 96 20 94 % -  06/09/14 1300 115/82 mmHg - - 105 25 91 % -  06/09/14 1200 115/63 mmHg - - 86 16 96 % -  06/09/14 1100 109/54 mmHg - - 96 21 96 % -  06/09/14 1000 - - - 100 20 96 % -  06/09/14 0900 121/56 mmHg - - 101 18 97 % -  06/09/14 0800 141/65 mmHg - - 103 28 97 % -   Current Weight  06/10/14 288 lb 5.8 oz (130.8 kg)    BASELINE WEIGHT: 129 kg    Intake/Output from previous day: 10/16 0701 - 10/17 0700 In: 1240 [P.O.:1240] Out: 3950 [Urine:3950]    PHYSICAL EXAM:  Heart: RRR Lungs: Slightly decreased in bases, overall clear Wound: Clean and dry Extremities: +Edema    Lab Results: CBC: Recent Labs  06/09/14 1600 06/10/14 0430  WBC 11.0* 10.6*  HGB 7.9* 8.3*  HCT 23.7* 25.1*  PLT 174 197   BMET:  Recent Labs  06/09/14 0536 06/10/14 0430  NA 134* 135*  K 4.7 4.4  CL 99 98  CO2 26 27  GLUCOSE 130* 116*  BUN 31* 28*  CREATININE 1.35 1.11  CALCIUM 8.1* 8.2*    PT/INR: No results found for this basename: LABPROT, INR,  in  the last 72 hours    Assessment/Plan: S/P Procedure(s) (LRB): CORONARY ARTERY BYPASS GRAFTING (CABG) x 5 USING LEFT AND RIGHT SAPHENOUS LEG VEIN HARVESTED ENDOSCOPICALLY (N/A) INTRAOPERATIVE TRANSESOPHAGEAL ECHOCARDIOGRAM (N/A)  CV- AF,Maintaining SR. BPs stable. Continue current meds.  Vol overload/CHF- Continue diuresis.  Renal- Cr trending back down. Acute renal failure improving  DM- sugars stable on Levemir. Hopefully can resume Metformin soon as Cr stabilizes.  Expected postop blood loss anemia- H/H stable.  CRPI, pulm toilet, LOC today.   LOS: 4 days    COLLINS,GINA H 06/10/2014

## 2014-06-11 ENCOUNTER — Inpatient Hospital Stay (HOSPITAL_COMMUNITY): Payer: Medicare PPO

## 2014-06-11 DIAGNOSIS — I2510 Atherosclerotic heart disease of native coronary artery without angina pectoris: Secondary | ICD-10-CM | POA: Diagnosis not present

## 2014-06-11 LAB — CBC
HCT: 25.7 % — ABNORMAL LOW (ref 39.0–52.0)
Hemoglobin: 8.5 g/dL — ABNORMAL LOW (ref 13.0–17.0)
MCH: 28.6 pg (ref 26.0–34.0)
MCHC: 33.1 g/dL (ref 30.0–36.0)
MCV: 86.5 fL (ref 78.0–100.0)
Platelets: 231 10*3/uL (ref 150–400)
RBC: 2.97 MIL/uL — ABNORMAL LOW (ref 4.22–5.81)
RDW: 13.4 % (ref 11.5–15.5)
WBC: 10.6 10*3/uL — ABNORMAL HIGH (ref 4.0–10.5)

## 2014-06-11 LAB — GLUCOSE, CAPILLARY
Glucose-Capillary: 119 mg/dL — ABNORMAL HIGH (ref 70–99)
Glucose-Capillary: 112 mg/dL — ABNORMAL HIGH (ref 70–99)
Glucose-Capillary: 149 mg/dL — ABNORMAL HIGH (ref 70–99)
Glucose-Capillary: 131 mg/dL — ABNORMAL HIGH (ref 70–99)

## 2014-06-11 LAB — BASIC METABOLIC PANEL
Anion gap: 10 (ref 5–15)
BUN: 27 mg/dL — ABNORMAL HIGH (ref 6–23)
CO2: 31 mEq/L (ref 19–32)
Calcium: 8.5 mg/dL (ref 8.4–10.5)
Chloride: 98 mEq/L (ref 96–112)
Creatinine, Ser: 1.2 mg/dL (ref 0.50–1.35)
GFR calc Af Amer: 68 mL/min — ABNORMAL LOW (ref >90)
GFR calc non Af Amer: 59 mL/min — ABNORMAL LOW (ref >90)
Glucose, Bld: 126 mg/dL — ABNORMAL HIGH (ref 70–99)
Potassium: 4 mEq/L (ref 3.7–5.3)
Sodium: 139 mEq/L (ref 137–147)

## 2014-06-11 IMAGING — CR DG CHEST 2V
2 series · 2 of 2 positions shown · non-contrast
Comparison: [DATE]

CLINICAL DATA: Status post CABG 5 days ago.  Shortness of breath.

EXAM:
CHEST  2 VIEW

[w chest pa]
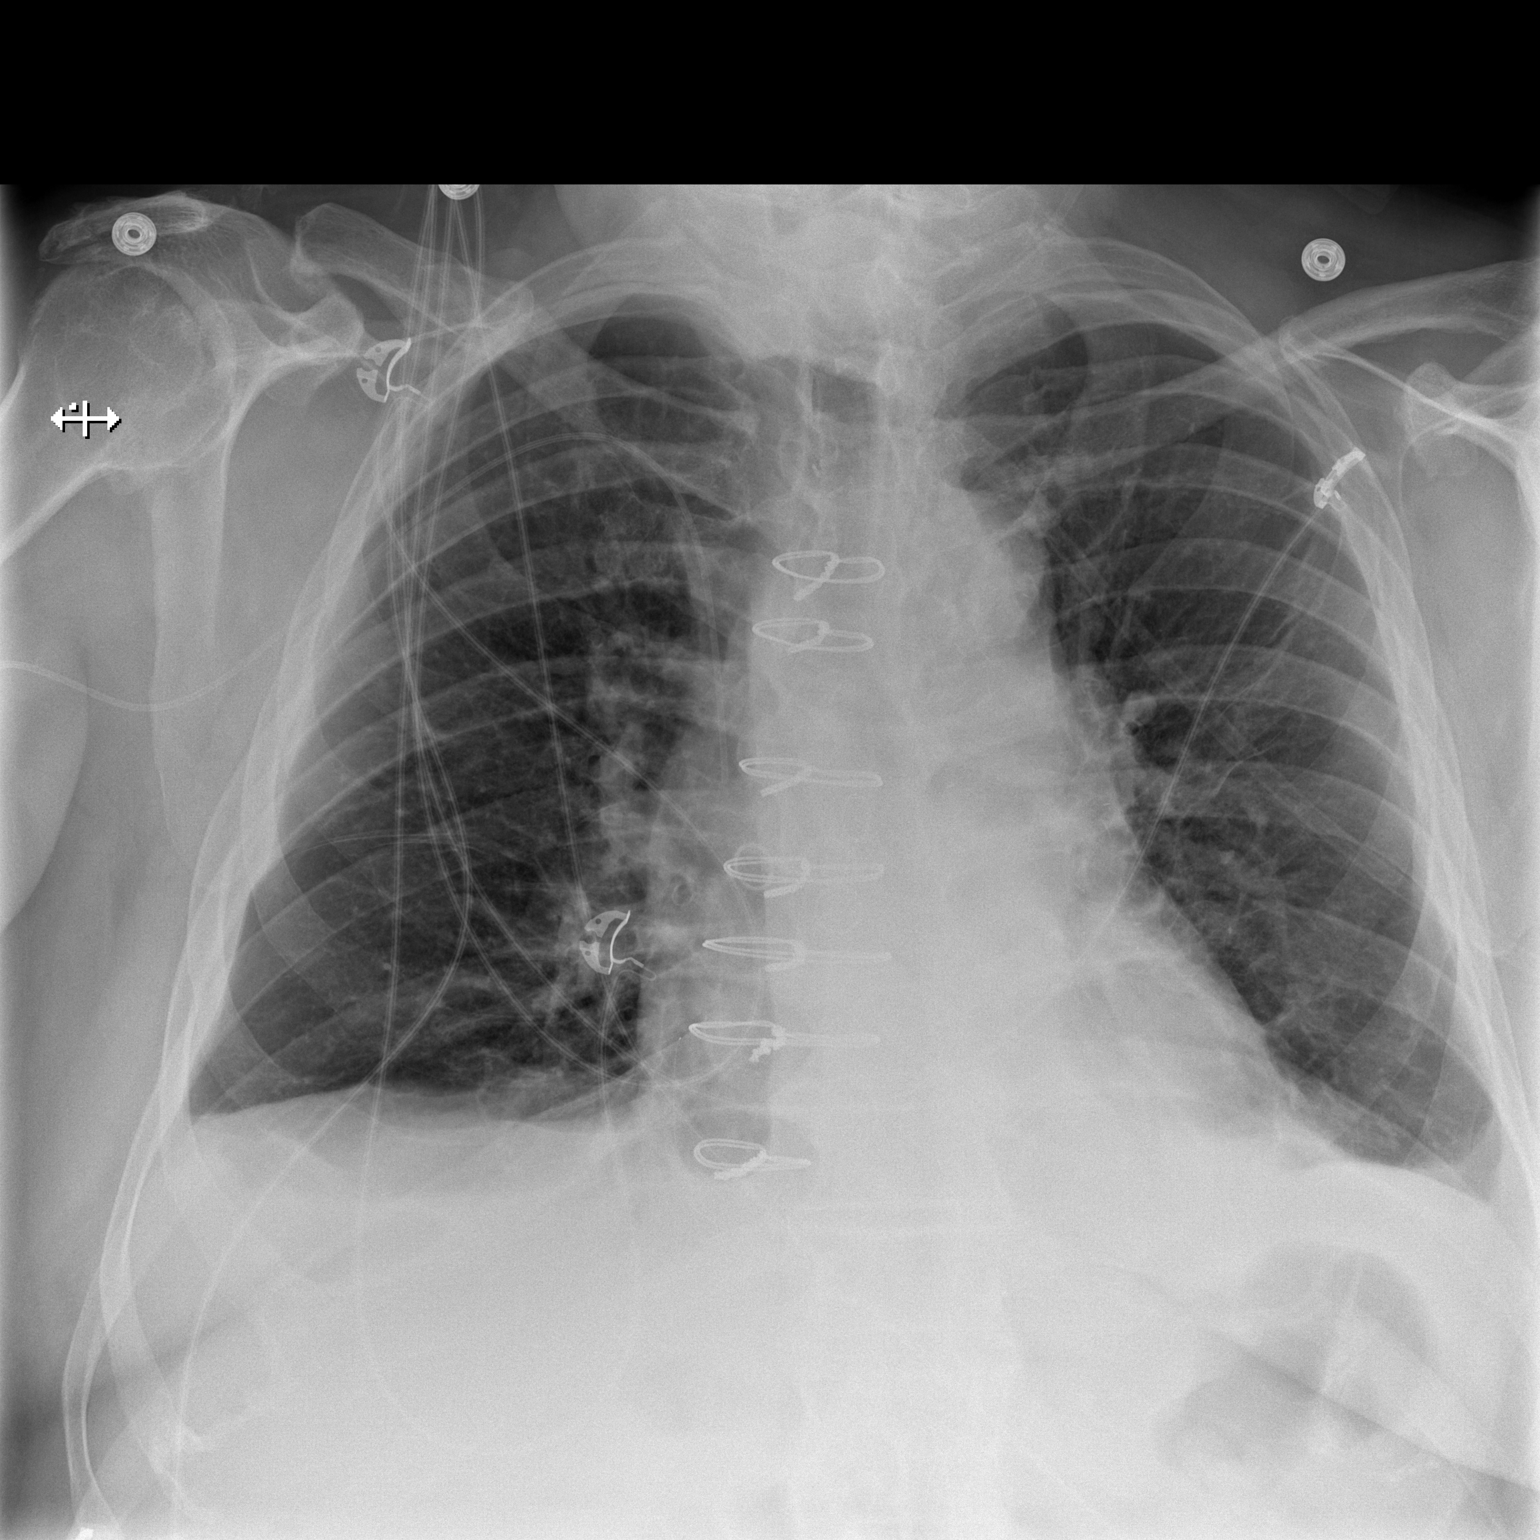

[w chest lat]
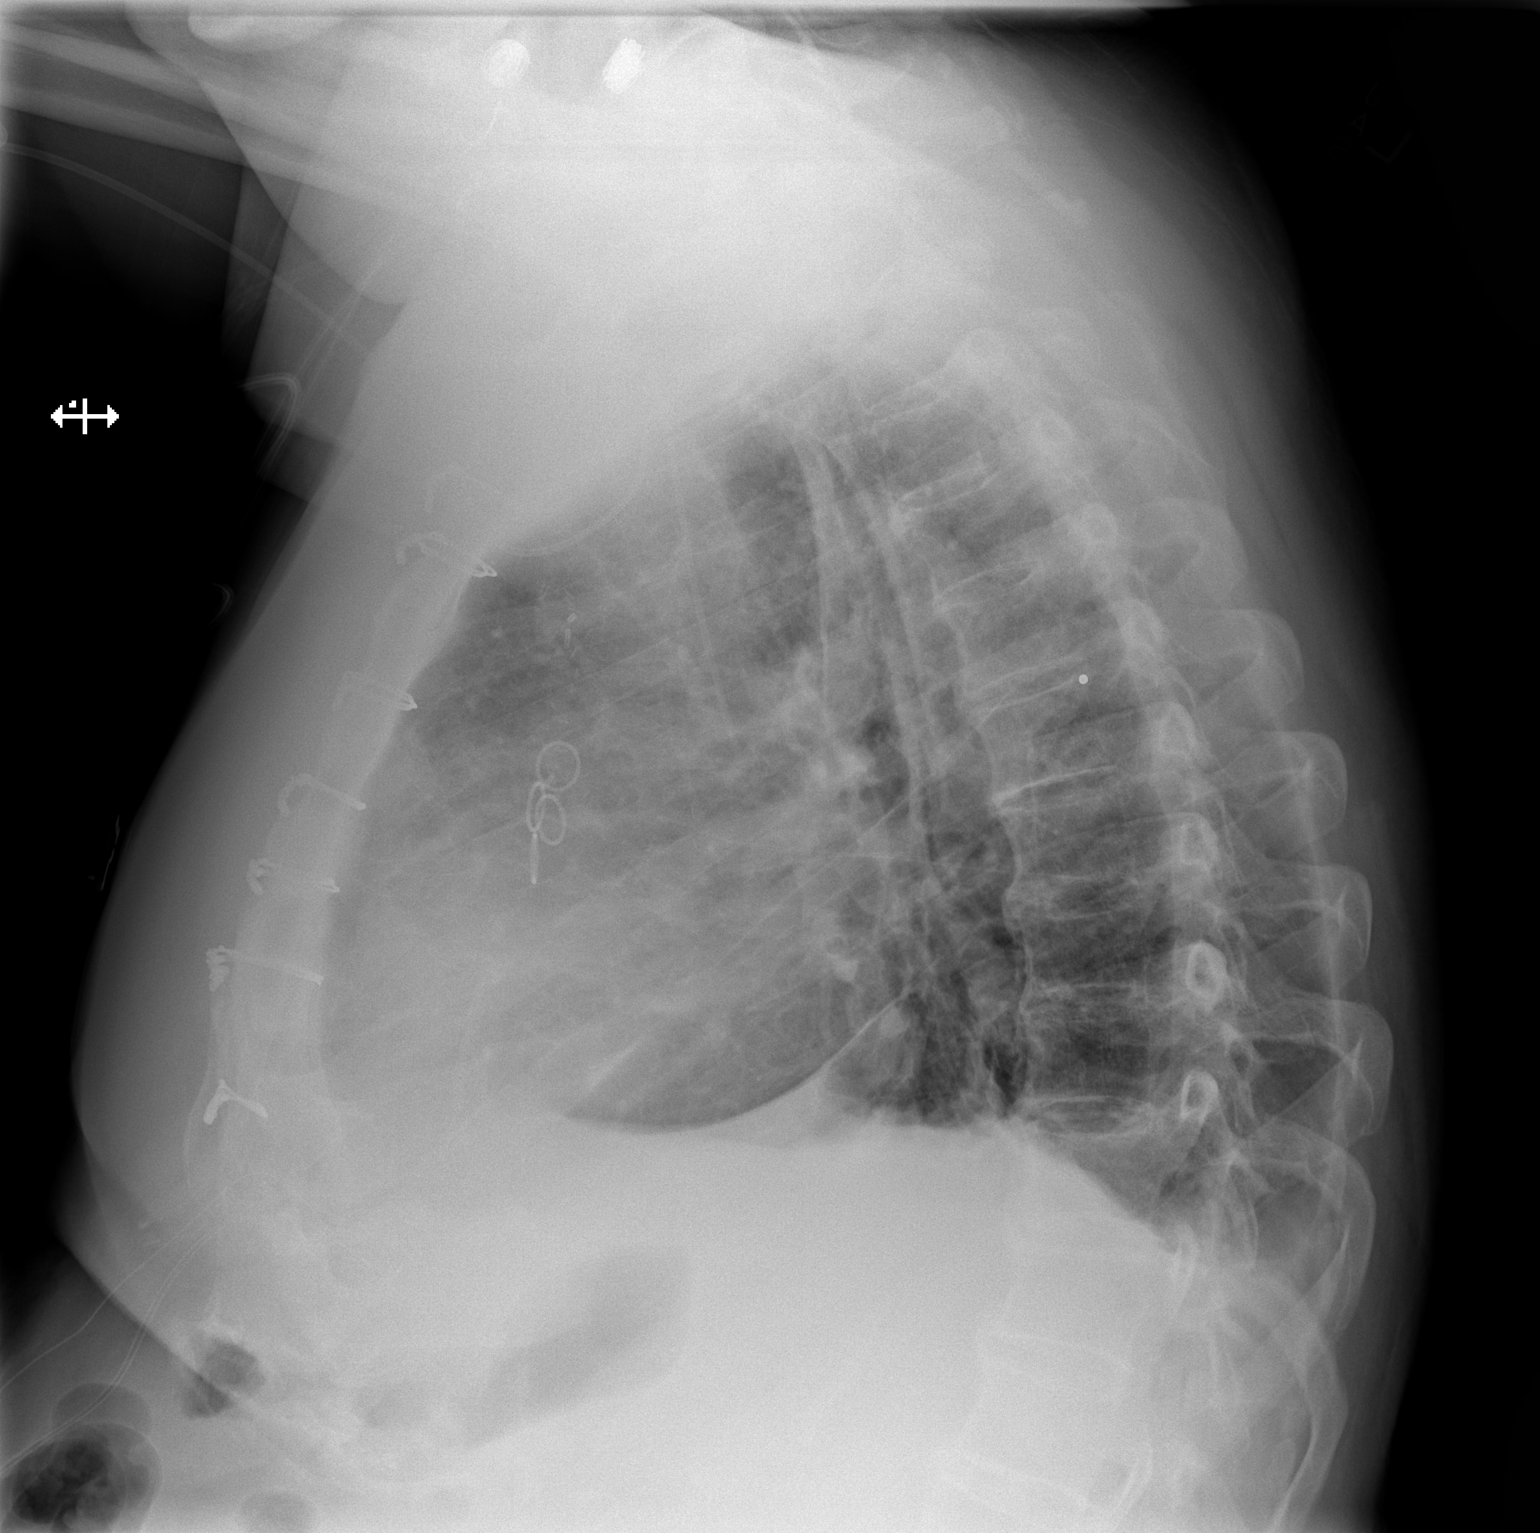

[2 of 2 positions shown; findings below may reference images not displayed]

FINDINGS: There is a right arm PICC line with tip in the cavoatrial junction.
The heart size is mildly enlarged. No change in small bilateral
pleural effusions with overlying atelectasis. No interstitial edema
or airspace consolidation.
IMPRESSION: 1. Stable small pleural effusions with bibasilar atelectasis.

## 2014-06-11 MED ORDER — LACTULOSE 10 GM/15ML PO SOLN
20.0000 g | Freq: Every day | ORAL | Status: DC | PRN
  Filled 2014-06-11: qty 30

## 2014-06-11 NOTE — Progress Notes (Signed)
Patient ambulated 300 feet with front wheel walker on RA. Pt tolerated well. Back to chair in room with call bell in reach. Will continue to assess.

## 2014-06-11 NOTE — Progress Notes (Signed)
       [address]       [place] [address]             [phone]          5 Days Post-Op Procedure(s) (LRB): CORONARY ARTERY BYPASS GRAFTING (CABG) x 5 USING LEFT AND RIGHT SAPHENOUS LEG VEIN HARVESTED ENDOSCOPICALLY (N/A) INTRAOPERATIVE TRANSESOPHAGEAL ECHOCARDIOGRAM (N/A)  Subjective: Rested poorly last night, anxious abput progress and bills.  Feels ok otherwise.  Breathing stable, +BM yesterday,  Walked already this am.  Objective: Vital signs in last 24 hours: Patient Vitals for the past 24 hrs:  BP Temp Temp src Pulse Resp SpO2 Weight  06/11/14 0351 - - - - - - 292 lb 8 oz (132.677 kg)  06/11/14 0335 117/55 mmHg 97.2 F (36.2 C) Oral 81 20 98 % -  06/10/14 2115 - - - - - 96 % -  06/10/14 2044 118/52 mmHg 98 F (36.7 C) Oral 92 18 99 % -  06/10/14 1800 - - - - - 92 % -  06/10/14 1403 125/55 mmHg 97.8 F (36.6 C) Oral 94 20 94 % -  06/10/14 1116 113/59 mmHg 98.2 F (36.8 C) Oral 88 - 97 % -   Current Weight  06/11/14 292 lb 8 oz (132.677 kg)  BASELINE WEIGHT: 129 kg    Intake/Output from previous day: 10/17 0701 - 10/18 0700 In: 960 [P.O.:960] Out: 2626 [Urine:2625; Stool:1]    PHYSICAL EXAM:  Heart: RRR Lungs: Diminished BS in bases Wound: Clean and dry Extremities:  +LE edema, R>L    Lab Results: CBC: Recent Labs  06/10/14 0430 06/11/14 0434  WBC 10.6* 10.6*  HGB 8.3* 8.5*  HCT 25.1* 25.7*  PLT 197 231   BMET:  Recent Labs  06/10/14 0430 06/11/14 0434  NA 135* 139  K 4.4 4.0  CL 98 98  CO2 27 31  GLUCOSE 116* 126*  BUN 28* 27*  CREATININE 1.11 1.20  CALCIUM 8.2* 8.5    PT/INR: No results found for this basename: LABPROT, INR,  in the last 72 hours    Assessment/Plan: S/P Procedure(s) (LRB): CORONARY ARTERY BYPASS GRAFTING (CABG) x 5 USING LEFT AND RIGHT SAPHENOUS LEG VEIN HARVESTED ENDOSCOPICALLY (N/A) INTRAOPERATIVE TRANSESOPHAGEAL ECHOCARDIOGRAM (N/A) CV- AF,Maintaining SR. BPs stable. Continue  current meds.  Vol overload/CHF- UOP excellent, but wt up today and still quite edematous.  Continue IV Lasix, may need to add a few doses Zaroxlyn.  Renal- Cr up slightly today.  Continue to watch. DM- sugars stable on Levemir. Hopefully can resume Metformin  as Cr stabilizes.  Expected postop blood loss anemia- H/H stable.  CRPI, pulm toilet, wean O2 as able.    LOS: 5 days    [name] [name] H 06/11/2014

## 2014-06-11 NOTE — Progress Notes (Signed)
Received call from RN at 0803 to administer PRN neb tx. Pt requests PRN tx because he says "they help me cough up the phlegm". RN agreed that this was the pt's request, but was in no distress. Advised RN that it would be a while before I would be able to give the tx due to being in the ICU and seeing those patiens first. I tried to offer pt a flutter valve on 06/10/14, and tried to explain that would be a better option, but he wouldn't let me explain why, and continued to interrupt me when I was explaining how flutter works. RT will continue to monitor.

## 2014-06-12 LAB — GLUCOSE, CAPILLARY
Glucose-Capillary: 121 mg/dL — ABNORMAL HIGH (ref 70–99)
Glucose-Capillary: 154 mg/dL — ABNORMAL HIGH (ref 70–99)
Glucose-Capillary: 117 mg/dL — ABNORMAL HIGH (ref 70–99)
Glucose-Capillary: 111 mg/dL — ABNORMAL HIGH (ref 70–99)

## 2014-06-12 MED ORDER — METFORMIN HCL 500 MG PO TABS
1000.0000 mg | ORAL_TABLET | Freq: Two times a day (BID) | ORAL | Status: DC
  Administered 2014-06-12 – 2014-06-14 (×4): 1000 mg via ORAL
  Filled 2014-06-12 (×6): qty 2

## 2014-06-12 MED ORDER — LISINOPRIL 20 MG PO TABS
20.0000 mg | ORAL_TABLET | Freq: Every day | ORAL | Status: DC
Start: 2014-06-12 — End: 2014-06-14
  Administered 2014-06-12 – 2014-06-14 (×3): 20 mg via ORAL
  Filled 2014-06-12 (×3): qty 1

## 2014-06-12 MED ORDER — METFORMIN HCL 500 MG PO TABS: 1000.0000 mg | ORAL_TABLET | Freq: Two times a day (BID) | ORAL | Status: DC

## 2014-06-12 MED FILL — Heparin Sodium (Porcine) Inj 1000 Unit/ML: INTRAMUSCULAR | Qty: 30 | Status: AC

## 2014-06-12 MED FILL — Magnesium Sulfate Inj 50%: INTRAMUSCULAR | Qty: 10 | Status: AC

## 2014-06-12 MED FILL — Potassium Chloride Inj 2 mEq/ML: INTRAVENOUS | Qty: 40 | Status: AC

## 2014-06-12 NOTE — Progress Notes (Signed)
EPW discontinued per MD orders and protocol. Wire ends intact and no complications noted. Patient verbalized understanding of bed rest x 1 hour. Call bell within reach and patient encouraged to call with any changes in condition. Bradley Ferris RN BSN 06/12/2014 3:13 PM

## 2014-06-12 NOTE — Progress Notes (Signed)
PT Cancellation Note  Patient Details Name: JERROLD WHITMARSH MRN: [id number] DOB: [birth year]   Cancelled Treatment:    Reason Eval/Treat Not Completed: Medical issues which prohibited therapy (pacing wires pulled at 1510 and could not get up til after 1610.  Unable to return to pts room after that time.)   Berline Lopes 06/12/2014, 4:34 PM [facility] Acute Rehabilitation [phone] (pager)

## 2014-06-12 NOTE — Progress Notes (Addendum)
[address]       [place] [id number]             [phone]      6 Days Post-Op Procedure(s) (LRB): CORONARY ARTERY BYPASS GRAFTING (CABG) x 5 USING LEFT AND RIGHT SAPHENOUS LEG VEIN HARVESTED ENDOSCOPICALLY (N/A) INTRAOPERATIVE TRANSESOPHAGEAL ECHOCARDIOGRAM (N/A) Subjective: conts to feel stronger/better, legs sore  Objective: Vital signs in last 24 hours: Temp:  [97.5 F (36.4 C)-98.5 F (36.9 C)] 97.5 F (36.4 C) (10/19 0442) Pulse Rate:  [87-93] 93 (10/19 0442) Cardiac Rhythm:  [-] Normal sinus rhythm (10/18 2201) Resp:  [19-20] 19 (10/19 0442) BP: (104-139)/(44-71) 113/71 mmHg (10/19 0442) SpO2:  [93 %-99 %] 94 % (10/19 0442) Weight:  [287 lb 11.2 oz (130.5 kg)] 287 lb 11.2 oz (130.5 kg) (10/19 0442)  Hemodynamic parameters for last 24 hours:    Intake/Output from previous day: 10/18 0701 - 10/19 0700 In: 480 [P.O.:480] Out: 3150 [Urine:3150] Intake/Output this shift:    General appearance: alert, cooperative and no distress Heart: regular rate and rhythm Lungs: clear to auscultation bilaterally Abdomen: soft, non-tender Extremities: + BLE pitting edema Wound: incis healing well  Lab Results:  Recent Labs  06/10/14 0430 06/11/14 0434  WBC 10.6* 10.6*  HGB 8.3* 8.5*  HCT 25.1* 25.7*  PLT 197 231   BMET:  Recent Labs  06/10/14 0430 06/11/14 0434  NA 135* 139  K 4.4 4.0  CL 98 98  CO2 27 31  GLUCOSE 116* 126*  BUN 28* 27*  CREATININE 1.11 1.20  CALCIUM 8.2* 8.5    PT/INR: No results found for this basename: LABPROT, INR,  in the last 72 hours ABG    Component Value Date/Time   PHART 7.320* 06/07/2014 0408   HCO3 26.2* 06/07/2014 0408   TCO2 25 06/07/2014 1736   ACIDBASEDEF 2.0 06/07/2014 0038   O2SAT 52.0 06/08/2014 0907   CBG (last 3)   Recent Labs  06/11/14 1654 06/11/14 2132 06/12/14 0620  GLUCAP 149* 131* 121*    Meds Scheduled Meds: . amiodarone  400 mg Oral BID  . antiseptic oral rinse  7 mL  Mouth Rinse BID  . aspirin EC  325 mg Oral Daily   Or  . aspirin  324 mg Per Tube Daily  . atorvastatin  80 mg Oral q1800  . bisacodyl  10 mg Oral Daily   Or  . bisacodyl  10 mg Rectal Daily  . docusate sodium  200 mg Oral Daily  . enoxaparin (LOVENOX) injection  40 mg Subcutaneous QHS  . furosemide  40 mg Intravenous BID  . gabapentin  300 mg Oral TID  . insulin aspart  0-24 Units Subcutaneous TID AC & HS  . insulin detemir  20 Units Subcutaneous Daily  . lisinopril  10 mg Oral Daily  . metoprolol tartrate  12.5 mg Oral BID   Or  . metoprolol tartrate  12.5 mg Per Tube BID  . pantoprazole  40 mg Oral Daily  . simethicone  120 mg Oral Daily  . sodium chloride  10-40 mL Intracatheter Q12H  . sodium chloride  3 mL Intravenous Q12H  . sodium chloride  3 mL Intravenous Q12H   Continuous Infusions: . sodium chloride     PRN Meds:.sodium chloride, alum & mag hydroxide-simeth, guaiFENesin-dextromethorphan, lactulose, levalbuterol, magnesium hydroxide, metoprolol, ondansetron (ZOFRAN) IV, oxyCODONE, sodium chloride, sodium chloride, sodium chloride, traMADol  Xrays Dg Chest 2 View  06/11/2014   CLINICAL DATA:  Status post CABG 5 days ago.  Shortness of breath.  EXAM: CHEST  2 VIEW  COMPARISON:  06/10/2014  FINDINGS: There is a right arm PICC line with tip in the cavoatrial junction. The heart size is mildly enlarged. No change in small bilateral pleural effusions with overlying atelectasis. No interstitial edema or airspace consolidation.  IMPRESSION: 1. Stable small pleural effusions with bibasilar atelectasis.   Electronically Signed   By: Signa Kell M.D.   On: 06/11/2014 08:01   Dg Chest 2 View  06/10/2014   CLINICAL DATA:  Congestive heart failure.  History of CABG.  EXAM: CHEST  2 VIEW  COMPARISON:  Single view of the chest 06/09/2014 and 06/08/2014.  FINDINGS: Right PICC remains in place, unchanged. Pulmonary edema appears resolved. There are small bilateral pleural effusions  and basilar atelectasis, also improved.  IMPRESSION: Resolved pulmonary edema with small residual pleural effusions and basilar atelectasis.   Electronically Signed   By: Drusilla Kanner M.D.   On: 06/10/2014 13:22    Assessment/Plan: S/P Procedure(s) (LRB): CORONARY ARTERY BYPASS GRAFTING (CABG) x 5 USING LEFT AND RIGHT SAPHENOUS LEG VEIN HARVESTED ENDOSCOPICALLY (N/A) INTRAOPERATIVE TRANSESOPHAGEAL ECHOCARDIOGRAM (N/A)  1 Conts to make good progress 2 In sinus with some PVC's 3 sugars controlled pretty well, d/c levemir and restart glucophage, check creat in am 4 d/c epw's 5 cont diuresis, change to po soon(lasix) 6 increase ace inhib a bit for BP control 7 poss SNF i-2 days  LOS: 6 days    GOLD,WAYNE E 06/12/2014  patient examined and medical record reviewed,agree with above note. VAN TRIGT III,Annjanette Wertenberger 06/12/2014

## 2014-06-12 NOTE — Progress Notes (Signed)
CARDIAC REHAB PHASE I   PRE:  Rate/Rhythm: 90 SR  BP:  Supine:   Sitting: 109/54  Standing:    SaO2: 92%RA  MODE:  Ambulation: 800 ft   POST:  Rate/Rhythm: 102 ST  BP:  Supine:   Sitting: 149/59  Standing:    SaO2: 92%RA 0950-1022 Pt walked 800 ft on RA with rolling walker in room and minimal asst. Pt stated he has rolling walker at home to use. Pt wanted to go farther and tolerated well. Did c/o some right foot pain that he has been having. Was able to bear weight without difficulty. To recliner with call bell after walk. Pt stated he has girlfriend who is NT that has taken time off to take care of him when he goes home.   Luetta Nutting, RN BSN  06/12/2014 10:20 AM

## 2014-06-13 LAB — TYPE AND SCREEN
ABO/RH(D): O POS
Antibody Screen: NEGATIVE
Unit division: 0
Unit division: 0

## 2014-06-13 LAB — CBC
HCT: 29.5 % — ABNORMAL LOW (ref 39.0–52.0)
Hemoglobin: 9.5 g/dL — ABNORMAL LOW (ref 13.0–17.0)
MCH: 29.7 pg (ref 26.0–34.0)
MCHC: 32.2 g/dL (ref 30.0–36.0)
MCV: 92.2 fL (ref 78.0–100.0)
Platelets: 293 10*3/uL (ref 150–400)
RBC: 3.2 MIL/uL — ABNORMAL LOW (ref 4.22–5.81)
RDW: 13.7 % (ref 11.5–15.5)
WBC: 12.1 10*3/uL — ABNORMAL HIGH (ref 4.0–10.5)

## 2014-06-13 LAB — GLUCOSE, CAPILLARY
Glucose-Capillary: 121 mg/dL — ABNORMAL HIGH (ref 70–99)
Glucose-Capillary: 100 mg/dL — ABNORMAL HIGH (ref 70–99)
Glucose-Capillary: 117 mg/dL — ABNORMAL HIGH (ref 70–99)
Glucose-Capillary: 124 mg/dL — ABNORMAL HIGH (ref 70–99)

## 2014-06-13 LAB — BASIC METABOLIC PANEL
Anion gap: 11 (ref 5–15)
BUN: 22 mg/dL (ref 6–23)
CHLORIDE: 95 meq/L — AB (ref 96–112)
CO2: 30 meq/L (ref 19–32)
CREATININE: 1.2 mg/dL (ref 0.50–1.35)
Calcium: 9 mg/dL (ref 8.4–10.5)
GFR calc Af Amer: 68 mL/min — ABNORMAL LOW (ref >90)
GFR calc non Af Amer: 59 mL/min — ABNORMAL LOW (ref >90)
Glucose, Bld: 126 mg/dL — ABNORMAL HIGH (ref 70–99)
Potassium: 4.6 mEq/L (ref 3.7–5.3)
Sodium: 136 mEq/L — ABNORMAL LOW (ref 137–147)

## 2014-06-13 NOTE — Progress Notes (Signed)
      [address]       [place] [address]             [phone]        8 Days Post-Op Procedure(s) (LRB): CORONARY ARTERY BYPASS GRAFTING (CABG) x 5 USING LEFT AND RIGHT SAPHENOUS LEG VEIN HARVESTED ENDOSCOPICALLY (N/A) INTRAOPERATIVE TRANSESOPHAGEAL ECHOCARDIOGRAM (N/A)  Subjective: Patient without complaints.  Objective: Vital signs in last 24 hours: Temp:  [97.6 F (36.4 C)-97.8 F (36.6 C)] 97.8 F (36.6 C) (10/21 0415) Pulse Rate:  [74-99] 74 (10/21 0415) Cardiac Rhythm:  [-] Normal sinus rhythm (10/21 0800) Resp:  [19-20] 20 (10/21 0415) BP: (96-122)/(47-73) 122/49 mmHg (10/21 0415) SpO2:  [94 %-95 %] 94 % (10/21 0415) Weight:  [285 lb 3.2 oz (129.366 kg)] 285 lb 3.2 oz (129.366 kg) (10/21 0500)  Pre op weight 129 kg Current Weight  06/14/14 285 lb 3.2 oz (129.366 kg)      Intake/Output from previous day: 10/20 0701 - 10/21 0700 In: 870 [P.O.:840; I.V.:30] Out: 975 [Urine:975]   Physical Exam:  Cardiovascular: RRR, no murmurs, gallops, or rubs. Pulmonary: Clear to auscultation bilaterally; no rales, wheezes, or rhonchi. Abdomen: Soft, non tender, bowel sounds present. Extremities: Mild bilateral lower extremity edema. Wounds: Clean and dry.  No erythema or signs of infection.  Lab Results: CBC:  Recent Labs  06/13/14 0625  WBC 12.1*  HGB 9.5*  HCT 29.5*  PLT 293   BMET:   Recent Labs  06/13/14 0625  NA 136*  K 4.6  CL 95*  CO2 30  GLUCOSE 126*  BUN 22  CREATININE 1.20  CALCIUM 9.0    PT/INR:  Lab Results  Component Value Date   INR 1.69* 06/06/2014   INR 1.24 06/02/2014   ABG:  INR: Will add last result for INR, ABG once components are confirmed Will add last 4 CBG results once components are confirmed  Assessment/Plan:  1. CV - Previous a fib. Maintaining SR. On Amiodarone 400 bid, Lopressor 12.5 bid, and Lisinopril 20 daily. 2.  Pulmonary - Encourage incentive spirometer 3. Volume Overload - On Lasix  40 daily 4.  Acute blood loss anemia - Last H and H stable at 9.5 and 29.5 5. DM-CBGs 117/124/117. Pre op HGA1C 6.5. Continue Metformin. 6. To SNF when bed available  Ticia Virgo MPA-C 06/14/2014,9:29 AM

## 2014-06-13 NOTE — Progress Notes (Signed)
       [address]       [place] [address]             [phone]          7 Days Post-Op Procedure(s) (LRB): CORONARY ARTERY BYPASS GRAFTING (CABG) x 5 USING LEFT AND RIGHT SAPHENOUS LEG VEIN HARVESTED ENDOSCOPICALLY (N/A) INTRAOPERATIVE TRANSESOPHAGEAL ECHOCARDIOGRAM (N/A)  Subjective: Just back from walking.  Feels well, no complaints.   Objective: Vital signs in last 24 hours: Patient Vitals for the past 24 hrs:  BP Temp Temp src Pulse Resp SpO2 Weight  06/13/14 0433 127/52 mmHg 98.1 F (36.7 C) Oral 83 18 97 % 290 lb 9.6 oz (131.815 kg)  06/12/14 2104 124/49 mmHg 98.2 F (36.8 C) Oral 90 18 95 % -  06/12/14 1500 109/49 mmHg 97.8 F (36.6 C) Oral 84 18 94 % -  06/12/14 0900 125/45 mmHg 99.3 F (37.4 C) Oral 94 20 94 % -   Current Weight  06/13/14 290 lb 9.6 oz (131.815 kg)  BASELINE WEIGHT: 129 kg    Intake/Output from previous day: 10/19 0701 - 10/20 0700 In: 1000 [P.O.:980; I.V.:20] Out: 2475 [Urine:2475]  CBGs 117-111-121-126   PHYSICAL EXAM:  Heart: RRR Lungs: Clear Wound: Clean and dry Extremities: +LE edema, R>L    Lab Results: CBC: Recent Labs  06/11/14 0434 06/13/14 0625  WBC 10.6* 12.1*  HGB 8.5* 9.5*  HCT 25.7* 29.5*  PLT 231 293   BMET:  Recent Labs  06/11/14 0434 06/13/14 0625  NA 139 136*  K 4.0 4.6  CL 98 95*  CO2 31 30  GLUCOSE 126* 126*  BUN 27* 22  CREATININE 1.20 1.20  CALCIUM 8.5 9.0    PT/INR: No results found for this basename: LABPROT, INR,  in the last 72 hours    Assessment/Plan: S/P Procedure(s) (LRB): CORONARY ARTERY BYPASS GRAFTING (CABG) x 5 USING LEFT AND RIGHT SAPHENOUS LEG VEIN HARVESTED ENDOSCOPICALLY (N/A) INTRAOPERATIVE TRANSESOPHAGEAL ECHOCARDIOGRAM (N/A) CV- postop AF,Maintaining SR. BPs stable. Continue current meds.  Vol overload/CHF- Will start po Lasix and watch. Renal- Cr stable.  DM- sugars stable on po Metformin  Expected postop blood loss anemia- H/H stable.    Disp- pt requesting SNF at [facility] in [place] post-discharge.  Will consult CSW and OT .  Hopefully ready to transfer in next 24 hours.    LOS: 7 days    [name] [name] H 06/13/2014

## 2014-06-13 NOTE — Progress Notes (Signed)
Physical Therapy Treatment Patient Details Name: [name] [name] MRN: [id number] DOB: 06/29/1941 Today's Date: 06/13/2014    History of Present Illness Adm 06/06/14 for CABG x5 (using bil saphenous veins). PMHx- CHF with EF 25%, DM with neuropathy feet, CAD with recent MI, gout, AAA, Rt rotator cuff repair,     PT Comments    Patient is progressing towards physical therapy goals. Requires cues to maintain sternal precautions. Increasing ambulatory distance nicely, but requires a rolling walker with min guard assist for balance. Patient will continue to benefit from skilled physical therapy services to further improve independence with functional mobility.  Follow Up Recommendations  SNF;Supervision/Assistance - 24 hour     Equipment Recommendations  Rolling walker with 5" wheels    Recommendations for Other Services       Precautions / Restrictions Precautions Precautions: Sternal;Fall Precaution Comments: educated on sternal precautions Restrictions Weight Bearing Restrictions: Yes (sternal)    Mobility  Bed Mobility                  Transfers Overall transfer level: Needs assistance Equipment used: Rolling walker (2 wheeled) Transfers: Sit to/from Stand Sit to Stand: Min guard         General transfer comment: VC for hand placement across chest, required several reminders for sternal precautions. Performed x3 from recliner. Min guard for safety. Minimal sway upon standing, using posterior knees for support.  Ambulation/Gait Ambulation/Gait assistance: Min guard Ambulation Distance (Feet): 425 Feet Assistive device: Rolling walker (2 wheeled) Gait Pattern/deviations: Step-through pattern;Decreased stride length;Trunk flexed Gait velocity: decreased   General Gait Details: VC for upright posture and walker placement. required one standing rest break during bout. HR 99 with SpO2 94% during ambulation. No loss of balance while using a rolling  walker.   Stairs            Wheelchair Mobility    Modified Rankin (Stroke Patients Only)       Balance                                    Cognition Arousal/Alertness: Awake/alert Behavior During Therapy: WFL for tasks assessed/performed Overall Cognitive Status: Within Functional Limits for tasks assessed                      Exercises General Exercises - Lower Extremity Ankle Circles/Pumps: AROM;Both;10 reps;Seated Long Arc Quad: Both;Strengthening;10 reps;Seated Hip Flexion/Marching: Strengthening;Both;10 reps;Seated    General Comments General comments (skin integrity, edema, etc.): Reviewed sternal precautions, pt recalls 2/4 correctly. Needs frequent cues to maintain.      Pertinent Vitals/Pain Pain Assessment: 0-10 Pain Score: 5  Pain Location: Rt LE and foot (Mostly when I'm walking) Pain Descriptors / Indicators: Sore Pain Intervention(s): Repositioned;Monitored during session    Home Living                      Prior Function            PT Goals (current goals can now be found in the care plan section) Acute Rehab PT Goals PT Goal Formulation: With patient Time For Goal Achievement: 06/15/14 Potential to Achieve Goals: Good Progress towards PT goals: Progressing toward goals    Frequency  Min 3X/week    PT Plan Current plan remains appropriate    Co-evaluation             End of Session  Equipment Utilized During Treatment: Gait belt Activity Tolerance: Patient tolerated treatment well Patient left: in chair;with call bell/phone within reach     Time: 0955-1020 PT Time Calculation (min): 25 min  Charges:  $Gait Training: 8-22 mins $Therapeutic Activity: 8-22 mins                    G Codes:      [employer], [place] [phone]  Berton Mount 06/13/2014, 10:36 AM

## 2014-06-13 NOTE — Progress Notes (Signed)
CARDIAC REHAB PHASE I   PRE:  Rate/Rhythm: 78 SR  BP:  Supine:   Sitting: 102/46  Standing:    SaO2: 93%RA  MODE:  Ambulation: 500 ft   POST:  Rate/Rhythm: 94  BP:  Supine:   Sitting: 121/49  Standing:    SaO2: 92%RA 1422-1500 Pt walked 500 ft with rolling walker and minimal asst. Cut walk short since it is his third walk today and right foot hurting a little. To recliner after walk. Put on discharge OHS  Video for pt and girlfriend to view. Tolerated walk well.   Luetta Nutting, RN BSN  06/13/2014 2:55 PM

## 2014-06-14 ENCOUNTER — Encounter: Payer: Self-pay | Admitting: Internal Medicine

## 2014-06-14 LAB — GLUCOSE, CAPILLARY
Glucose-Capillary: 117 mg/dL — ABNORMAL HIGH (ref 70–99)
Glucose-Capillary: 114 mg/dL — ABNORMAL HIGH (ref 70–99)

## 2014-06-14 MED ORDER — INSULIN ASPART 100 UNIT/ML ~~LOC~~ SOLN
0.0000 [IU] | Freq: Three times a day (TID) | SUBCUTANEOUS | Status: DC
Start: 2014-06-14 — End: 2014-07-19

## 2014-06-14 MED ORDER — METOPROLOL TARTRATE 12.5 MG HALF TABLET: 12.5000 mg | ORAL_TABLET | Freq: Two times a day (BID) | ORAL | Status: DC

## 2014-06-14 MED ORDER — FUROSEMIDE 40 MG PO TABS: 40.0000 mg | ORAL_TABLET | Freq: Every day | ORAL | Status: DC

## 2014-06-14 MED ORDER — AMIODARONE HCL 400 MG PO TABS: 400.0000 mg | ORAL_TABLET | Freq: Two times a day (BID) | ORAL | Status: DC

## 2014-06-14 MED ORDER — POTASSIUM CHLORIDE ER 20 MEQ PO TBCR: 20.0000 meq | EXTENDED_RELEASE_TABLET | Freq: Every day | ORAL | Status: DC

## 2014-06-14 MED ORDER — ASPIRIN 325 MG PO TBEC
325.0000 mg | DELAYED_RELEASE_TABLET | Freq: Every day | ORAL | Status: DC
Start: 2014-06-14 — End: 2015-09-20

## 2014-06-14 MED ORDER — OXYCODONE HCL 5 MG PO TABS: 5.0000 mg | ORAL_TABLET | ORAL | Status: DC | PRN

## 2014-06-14 NOTE — Evaluation (Signed)
Occupational Therapy Evaluation Patient Details Name: [name] [name] MRN: [id number] DOB: 06/13/1941 Today's Date: 06/14/2014    History of Present Illness Adm 06/06/14 for CABG x5 (using bil saphenous veins). PMHx- CHF with EF 25%, DM with neuropathy feet, CAD with recent MI, gout, AAA, Rt rotator cuff repair,    Clinical Impression   Pt admitted with above. He demonstrates the below listed deficits and will benefit from continued OT to maximize safety and independence with BADLs.  Currently, pt is able to perform BADLs with min guard assist.  He requires verbal cues for safety and sternal precautions.  Recommend SNF level rehab to allow him to return home at modified independent level with BADLs and IADLs.       Follow Up Recommendations  SNF;Supervision/Assistance - 24 hour    Equipment Recommendations  3 in 1 bedside comode    Recommendations for Other Services       Precautions / Restrictions Precautions Precautions: Sternal;Fall Precaution Comments: Pt able to state sternal precautions, but requires min verbal cues  Restrictions Weight Bearing Restrictions: Yes (sternal precautions)      Mobility Bed Mobility               General bed mobility comments: Pt sitting in recliner  Transfers Overall transfer level: Needs assistance Equipment used: Rolling walker (2 wheeled) Transfers: Sit to/from BJ'sStand;Stand Pivot Transfers Sit to Stand: Min guard Stand pivot transfers: Min guard       General transfer comment: Pt requires verbal cues for hand placement and sternal precautions     Balance Overall balance assessment: Needs assistance Sitting-balance support: Feet supported Sitting balance-Leahy Scale: Good     Standing balance support: During functional activity Standing balance-Leahy Scale: Fair                              ADL Overall ADL's : Needs assistance/impaired Eating/Feeding: Independent;Sitting   Grooming: Wash/dry  hands;Wash/dry face;Oral care;Brushing hair;Min guard;Standing   Upper Body Bathing: Supervision/ safety;Sitting   Lower Body Bathing: Min guard;Sit to/from stand   Upper Body Dressing : Supervision/safety;Sitting   Lower Body Dressing: Min guard;Sit to/from stand   Toilet Transfer: Min guard;Ambulation;Comfort height toilet;BSC;RW   Toileting- ArchitectClothing Manipulation and Hygiene: Min guard;Sit to/from Nurse, children'sstand     Tub/Shower Transfer Details (indicate cue type and reason): NT Functional mobility during ADLs: Min guard;Rolling walker General ADL Comments: Pt able to access feet for LB ADLs.  He is mildly impulsive and requires cues for safety      Vision                     Perception     Praxis      Pertinent Vitals/Pain Pain Assessment: No/denies pain     Hand Dominance Right   Extremity/Trunk Assessment Upper Extremity Assessment Upper Extremity Assessment: Generalized weakness   Lower Extremity Assessment Lower Extremity Assessment: Defer to PT evaluation   Cervical / Trunk Assessment Cervical / Trunk Assessment: Normal   Communication Communication Communication: No difficulties   Cognition Arousal/Alertness: Awake/alert Behavior During Therapy: WFL for tasks assessed/performed Overall Cognitive Status: Impaired/Different from baseline Area of Impairment: Safety/judgement         Safety/Judgement: Decreased awareness of safety     General Comments: Pt is mildly impulsive and requires min verbal cues for sternal precautions    General Comments       Exercises       Shoulder  Instructions      Home Living Family/patient expects to be discharged to:: Skilled nursing facility Living Arrangements: Alone Available Help at Discharge: Friend(s);Available PRN/intermittently Type of Home: House Home Access: Stairs to enter [employer] of Steps: 3 Entrance Stairs-Rails: Right Home Layout: One level               Home Equipment:  Walker - standard;Cane - single point          Prior Functioning/Environment Level of Independence: Independent with assistive device(s)        Comments: 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    OT Diagnosis: Generalized weakness   OT Problem List: Decreased strength;Decreased activity tolerance;Impaired balance (sitting and/or standing);Decreased safety awareness;Decreased knowledge of precautions;Decreased knowledge of use of DME or AE   OT Treatment/Interventions: Self-care/ADL training;DME and/or AE instruction;Therapeutic activities;Patient/family education;Balance training    OT Goals(Current goals can be found in the care plan section) Acute Rehab OT Goals Patient Stated Goal: to go home  OT Goal Formulation: With patient Time For Goal Achievement: 06/21/14 Potential to Achieve Goals: Good ADL Goals Pt Will Perform Grooming: with modified independence;standing Pt Will Perform Upper Body Bathing: with modified independence;sitting;standing Pt Will Perform Lower Body Bathing: with modified independence;sit to/from stand Pt Will Perform Upper Body Dressing: with modified independence;sitting Pt Will Perform Lower Body Dressing: with modified independence;sit to/from stand Pt Will Transfer to Toilet: with modified independence;ambulating;regular height toilet;bedside commode Pt Will Perform Toileting - Clothing Manipulation and hygiene: with modified independence;sit to/from stand Pt Will Perform Tub/Shower Transfer: Tub transfer;Shower transfer;with modified independence;ambulating;rolling walker Additional ADL Goal #1: Pt will be independent with sternal precautions during BADLs and functional mobility   OT Frequency: Min 2X/week   Barriers to D/C: Decreased caregiver support          Co-evaluation              End of Session Equipment Utilized During Treatment: Rolling walker Nurse Communication: Mobility status  Activity Tolerance: Patient tolerated  treatment well Patient left: in chair;with call bell/phone within reach;with family/visitor present   Time: 1041-1057 OT Time Calculation (min): 16 min Charges:  OT General Charges $OT Visit: 1 Procedure OT Evaluation $Initial OT Evaluation Tier I: 1 Procedure OT Treatments $Self Care/Home Management : 8-22 mins G-Codes:    Iqra Rotundo M 18-Jun-2014, 11:07 AM

## 2014-06-14 NOTE — Clinical Social Work Note (Signed)
Patient to be d/c'ed today to [facility] with Letter of Guarantee.  Patient and family agreeable to plans will transport via sister's personal vehicle,  RN to call report. Windell Moulding, MSW, Theresia Majors [phone]

## 2014-06-14 NOTE — Clinical Social Work Placement (Signed)
Clinical Social Work Department CLINICAL SOCIAL WORK PLACEMENT NOTE 06/14/2014  Patient:  [name] [name], [name] [name]  Account Number:  [id number] Admit date:  06/06/2014  Clinical Social Worker:  Leisha Trinkle, LCSWA  Date/time:  06/14/2014 04:49 PM  Clinical Social Work is seeking post-discharge placement for this patient at the following level of care:   SKILLED NURSING   (*CSW will update this form in Epic as items are completed)   06/14/2014  Patient/family provided with [facility] Department of Clinical Social Work's list of facilities offering this level of care within the geographic area requested by the patient (or if unable, by the patient's family).  06/14/2014  Patient/family informed of their freedom to choose among providers that offer the needed level of care, that participate in Medicare, Medicaid or managed care program needed by the patient, have an available bed and are willing to accept the patient.  06/14/2014  Patient/family informed of [facility]' ownership interest in [facility], as well as of the fact that they are under no obligation to receive care at this facility.  PASARR submitted to EDS on 06/14/2014 PASARR number received on 06/14/2014  FL2 transmitted to all facilities in geographic area requested by pt/family on  06/14/2014 FL2 transmitted to all facilities within larger geographic area on 06/14/2014  Patient informed that his/her managed care company has contracts with or will negotiate with  certain facilities, including the following:     Patient/family informed of bed offers received:  06/14/2014 Patient chooses bed at [facility] Physician recommends and patient chooses bed at    Patient to be transferred to [facility] on  06/14/2014 Patient to be transferred to facility by sister's car Patient and family notified of transfer on 06/14/2014 Name of family member notified:  Larey Days  The following physician request were entered  in Epic:   Additional Comments:  Ervin Knack. Trinitey Roache, MSW, Theresia Majors [phone] 06/14/2014 4:55 PM

## 2014-06-14 NOTE — Clinical Social Work Psychosocial (Signed)
Clinical Social Work Department BRIEF PSYCHOSOCIAL ASSESSMENT 06/14/2014  Patient:  [name] [name], [name] [name]     Account Number:  [id number]     Admit date:  06/06/2014  Clinical Social Worker:  Elouise Munroe  Date/Time:  06/14/2014 04:42 PM  Referred by:  Physician  Date Referred:  06/13/2014 Referred for  SNF Placement   Other Referral:   Interview type:  Patient Other interview type:    PSYCHOSOCIAL DATA Living Status:  WIFE Admitted from facility:   Level of care:   Primary support name:   Primary support relationship to patient:  FAMILY Degree of support available:   Patient's wife and sister are very supportive and are by his bedside.    CURRENT CONCERNS Current Concerns  Post-Acute Placement   Other Concerns:    SOCIAL WORK ASSESSMENT / PLAN Patient is a 73 year old married male who lives with his wife.  Patient was alert and oriented times 3.  Patient was sleepy but talkative, patient's wife and sister were at his bedside and are his primary support network.  Patient was seen by therapy and needs some short term rehab.  Patient agreed to going to a SNF for rehab in order to get strong again and return back home.  Patient stated he had a preference to go to [facility] for rehab if possible because it is close to where he lives.   Assessment/plan status:   Other assessment/ plan:   Information/referral to community resources:    PATIENT'S/FAMILY'S RESPONSE TO PLAN OF CARE: Patient and family in agreement to plan for short term rehab.    Ervin Knack. Cressida Milford, MSW, Theresia Majors [phone] 06/14/2014 4:48 PM

## 2014-06-14 NOTE — Progress Notes (Signed)
Ed completed with family/girlfriend present. Interested in [facility] and will send referral to [place]. Has walked x2 and sts he will walk after dinner tonight. [phone] Ethelda Chick CES, ACSM 12:03 PM 06/14/2014

## 2014-06-14 NOTE — Discharge Instructions (Signed)
SNF please do the following:  1. Please obtain vital signs at least one time daily 2.Please weigh the patient daily. If he or she continues to gain weight or develops lower extremity edema, contact the office at [phone]. 3. Ambulate patient at least three times daily and please use sternal precautions.  Activity: 1.May walk up steps                2.No lifting more than ten pounds for four weeks.                 3.No driving for four weeks.                4.Stop any activity that causes chest pain, shortness of breath, dizziness,                            sweating or excessive weakness.                5.Avoid straining.                6.Continue with your breathing exercises daily.  Diet: Diabetic, Low fat, Low saltdiet  Wound Care: May shower.  Clean wounds with mild soap and water daily. Contact the office at [phone] if any problems arise.  Coronary Artery Bypass Grafting, Care After Refer to this sheet in the next few weeks. These instructions provide you with information on caring for yourself after your procedure. Your health care provider may also give you more specific instructions. Your treatment has been planned according to current medical practices, but problems sometimes occur. Call your health care provider if you have any problems or questions after your procedure. WHAT TO EXPECT AFTER THE PROCEDURE Recovery from surgery will be different for everyone. Some people feel well after 3 or 4 weeks, while for others it takes longer. After your procedure, it is typical to have the following:  Nausea and a lack of appetite.   Constipation.  Weakness and fatigue.   Depression or irritability.   Pain or discomfort at your incision site. HOME CARE INSTRUCTIONS  Take medicines only as directed by your health care provider. Do not stop taking medicines or start any new medicines without first checking with your health care provider.  Take your pulse as directed by  your health care provider.  Perform deep breathing as directed by your health care provider. If you were given a device called an incentive spirometer, use it to practice deep breathing several times a day. Support your chest with a pillow or your arms when you take deep breaths or cough.  Keep incision areas clean, dry, and protected. Remove or change any bandages (dressings) only as directed by your health care provider. You may have skin adhesive strips over the incision areas. Do not take the strips off. They will fall off on their own.  Check incision areas daily for any swelling, redness, or drainage.  If incisions were made in your legs, do the following:  Avoid crossing your legs.   Avoid sitting for long periods of time. Change positions every 30 minutes.   Elevate your legs when you are sitting.  Wear compression stockings as directed by your health care provider. These stockings help keep blood clots from forming in your legs.  Take showers once your health care provider approves. Until then, only take sponge baths. Pat incisions dry. Do not rub incisions with a washcloth or towel.  Do not take baths, swim, or use a hot tub until your health care provider approves.  Eat foods that are high in fiber, such as raw fruits and vegetables, whole grains, beans, and nuts. Meats should be lean cut. Avoid canned, processed, and fried foods.  Drink enough fluid to keep your urine clear or pale yellow.  Weigh yourself every day. This helps identify if you are retaining fluid that may make your heart and lungs work harder.  Rest and limit activity as directed by your health care provider. You may be instructed to:  Stop any activity at once if you have chest pain, shortness of breath, irregular heartbeats, or dizziness. Get help right away if you have any of these symptoms.  Move around frequently for short periods or take short walks as directed by your health care provider. Increase  your activities gradually. You may need physical therapy or cardiac rehabilitation to help strengthen your muscles and build your endurance.  Avoid lifting, pushing, or pulling anything heavier than 10 lb (4.5 kg) for at least 6 weeks after surgery.  Do not drive until your health care provider approves.  Ask your health care provider when you may return to work.  Ask your health care provider when you may resume sexual activity.  Keep all follow-up visits as directed by your health care provider. This is important. SEEK MEDICAL CARE IF:  You have swelling, redness, increasing pain, or drainage at the site of an incision.  You have a fever.  You have swelling in your ankles or legs.  You have pain in your legs.   You gain 2 or more pounds (0.9 kg) a day.  You are nauseous or vomit.  You have diarrhea. SEEK IMMEDIATE MEDICAL CARE IF:  You have chest pain that goes to your jaw or arms.  You have shortness of breath.   You have a fast or irregular heartbeat.   You notice a "clicking" in your breastbone (sternum) when you move.   You have numbness or weakness in your arms or legs.  You feel dizzy or light-headed.  MAKE SURE YOU:  Understand these instructions.  Will watch your condition.  Will get help right away if you are not doing well or get worse. Document Released: 02/28/2005 Document Revised: 12/26/2013 Document Reviewed: 01/18/2013 [facility] Patient Information 2015 [place], [place]. This information is not intended to replace advice given to you by your health care provider. Make sure you discuss any questions you have with your health care provider.

## 2014-06-15 NOTE — Clinical Social Work Note (Signed)
Received phone call from [facility] Social Worker Morrie Sheldon who stated they did not receive signed FL2 and would like it faxed to facility.  This Child psychotherapist was informed by nurse that patient left hospital with sister upon discharge and sister had signed FL2 with her to give to facility.  Will fax signed FL2 first thing Friday morning.  Ervin Knack. Kindra Bickham, MSW, Theresia Majors [phone] 06/15/2014 11:11 PM

## 2014-06-16 ENCOUNTER — Other Ambulatory Visit: Payer: Self-pay | Admitting: *Deleted

## 2014-06-16 ENCOUNTER — Telehealth: Payer: Self-pay | Admitting: Cardiovascular Disease

## 2014-06-16 DIAGNOSIS — R609 Edema, unspecified: Secondary | ICD-10-CM

## 2014-06-16 DIAGNOSIS — I10 Essential (primary) hypertension: Secondary | ICD-10-CM

## 2014-06-16 MED ORDER — POTASSIUM CHLORIDE ER 20 MEQ PO TBCR: 20.0000 meq | EXTENDED_RELEASE_TABLET | Freq: Every day | ORAL | Status: DC

## 2014-06-16 MED ORDER — METOPROLOL TARTRATE 25 MG PO TABS: 25.0000 mg | ORAL_TABLET | Freq: Two times a day (BID) | ORAL | Status: DC

## 2014-06-16 MED ORDER — AMIODARONE HCL 200 MG PO TABS: 200.0000 mg | ORAL_TABLET | Freq: Two times a day (BID) | ORAL | Status: DC

## 2014-06-16 NOTE — Telephone Encounter (Signed)
LVM @ 1023

## 2014-06-16 NOTE — Progress Notes (Signed)
Received a call from [facility] in [place] to relate that [name] [name] left AMA after only two days care. They were therefore unable to give him any meds/prescriptions for home use. I called him at home and talked to his sister.  We went over his meds at home versus his discharge meds.  I called his pharmacy to order the meds he needed. I talked with [name] [name].  He is not having any post op pain. He said he was not getting any rest there and that's why he left. I stressed to his sister that he was to check his blood sugars ac and hs everyday.  She understood. They are to call us with any problems.

## 2014-06-16 NOTE — Telephone Encounter (Signed)
Questions about some new medications and some old medication that has been increased by the hospital and no one is able tell her. Please call her, she is worried no one will help her and its Friday,.

## 2014-06-20 NOTE — Discharge Summary (Signed)
patient examined and medical record reviewed,agree with above note. VAN TRIGT III,PETER 06/20/2014   

## 2014-06-25 ENCOUNTER — Encounter: Payer: Self-pay | Admitting: Internal Medicine

## 2014-06-26 ENCOUNTER — Ambulatory Visit (INDEPENDENT_AMBULATORY_CARE_PROVIDER_SITE_OTHER): Payer: Medicare PPO | Admitting: Cardiovascular Disease

## 2014-06-26 ENCOUNTER — Encounter: Payer: Self-pay | Admitting: Cardiovascular Disease

## 2014-06-26 VITALS — BP 124/73 | HR 72 | Ht 72.0 in | Wt 274.5 lb

## 2014-06-26 DIAGNOSIS — I5022 Chronic systolic (congestive) heart failure: Secondary | ICD-10-CM

## 2014-06-26 DIAGNOSIS — I5021 Acute systolic (congestive) heart failure: Secondary | ICD-10-CM

## 2014-06-26 DIAGNOSIS — I2510 Atherosclerotic heart disease of native coronary artery without angina pectoris: Secondary | ICD-10-CM

## 2014-06-26 DIAGNOSIS — E785 Hyperlipidemia, unspecified: Secondary | ICD-10-CM

## 2014-06-26 DIAGNOSIS — I10 Essential (primary) hypertension: Secondary | ICD-10-CM

## 2014-06-26 MED ORDER — ATORVASTATIN CALCIUM 40 MG PO TABS: 40.0000 mg | ORAL_TABLET | Freq: Every day | ORAL | Status: DC

## 2014-06-26 MED ORDER — CARVEDILOL 3.125 MG PO TABS: 3.1250 mg | ORAL_TABLET | Freq: Two times a day (BID) | ORAL | Status: DC

## 2014-06-26 NOTE — Assessment & Plan Note (Signed)
He is doing well after recent CABG with no symptoms suggestive of angina. Continue medical therapy. I advised him to attend cardiac rehabilitation but he is not interested.

## 2014-06-26 NOTE — Progress Notes (Signed)
HPI  This is a 73 y/o male who is here today for follow-up visit after CABG. He was  admitted to [facility] in August with chest pain and ruled in for NSTEMI. Echo showed severe LV dysfxn and cath revealed severe 3VD. He was transferred to [facility] for further evaluation by thoracic surgery. In the setting of ongoing volume overload and heart failure, he was felt to be a poor CABG candidate in the acute setting. Recommendation was made for diuresis, weight loss, and transesophageal echocardiogram to better evaluate his mitral valve. TEE showed mild to moderate mitral regurgitation. It was felt that he would benefit from outpatient advanced heart failure clinic evaluation and treatment prior to possible high-risk CABG with mitral valve replacement in the future.  He underwent CABG on October 13 by Dr. Donata Clay with LIMA to diagonal, SVG to LAD, SVG to OM1, SVG to OM 2 and SVG to right PDA. Intraoperative TEE showed only mild MR.  Postoperative course was remarkable for atrial fibrillation which was treated with amiodarone. He also had mild renal failure with improvement to baseline function before discharge.he was discharged to rehabilitation. He has been doing well and denies any chest pain or significant dyspnea. No palpitations or tachycardia.    No Known Allergies   Current Outpatient Prescriptions on File Prior to Visit  Medication Sig Dispense Refill  . amiodarone (PACERONE) 200 MG tablet Take 1 tablet (200 mg total) by mouth 2 (two) times daily. 60 tablet 1  . aspirin EC 325 MG EC tablet Take 1 tablet (325 mg total) by mouth daily. 30 tablet 0  . cholecalciferol (VITAMIN D) 1000 UNITS tablet Take 1,000 Units by mouth daily.    . colchicine 0.6 MG tablet Take 0.6 mg by mouth daily as needed (for gout flareup).     . cyanocobalamin 500 MCG tablet Take 500 mcg by mouth daily.    . furosemide (LASIX) 40 MG tablet Take 1 tablet (40 mg total) by mouth daily. For 2 weeks then stop. 30 tablet     . gabapentin (NEURONTIN) 300 MG capsule Take 300 mg by mouth 4 (four) times daily.     . insulin aspart (NOVOLOG) 100 UNIT/ML injection Inject 0-24 Units into the skin 4 (four) times daily -  before meals and at bedtime. Units of Insulin CBG,120=0   CBG 251-300=12 CBG 120-160=2  CBG 301-350=16 CBG 161-200=4  CBG 351-400=20 CBG  201-250=8 CBG>450=24 call MD 10 mL 11  . lisinopril (PRINIVIL,ZESTRIL) 20 MG tablet Take 20 mg by mouth every morning.     . metFORMIN (GLUCOPHAGE) 1000 MG tablet Take 1 tablet (1,000 mg total) by mouth 2 (two) times daily with a meal. 60 tablet 11  . Misc Natural Products (BLACK CHERRY CONCENTRATE PO) Take 1 capsule by mouth daily.    [name] omeprazole (PRILOSEC) 20 MG capsule Take 20 mg by mouth every morning.     [name] oxyCODONE (OXY IR/ROXICODONE) 5 MG immediate release tablet Take 1-2 tablets (5-10 mg total) by mouth every 4 (four) hours as needed for severe pain. 30 tablet 0  . Potassium Chloride ER 20 MEQ TBCR Take 20 mEq by mouth daily. For 2 weeks then stop. 14 tablet 0  . simethicone (MYLICON) 125 MG chewable tablet Chew 125 mg by mouth daily.     No current facility-administered medications on file prior to visit.     Past Medical History  Diagnosis Date  . Hypertension   . Hyperlipidemia   . Gout   .  Diabetes mellitus without complication   . Nephrolithiasis   . Umbilical hernia   . Epigastric hernia   . Obesity   . CAD (coronary artery disease)     a. 03/2014 NSTEMI/Cath: LM nl, LAD 3566m - hazy, D1 90p, D2 100, D3 min irregs, LCX 7312m, OM2 95, OM3 90 small, RCA 182m-->pending further CABG eval.  . Chronic systolic CHF (congestive heart failure)     a. EF 25-30% by 2D ECHO 04/20/14. Severely dec global hypokinesis. mildly elevated RVSP  . AAA (abdominal aortic aneurysm)   . Ischemic cardiomyopathy     a. EF 25-30% 03/2014.  . Mitral regurgitation     a. 03/2014 TEE: mild to mod MR.  . Myocardial infarction   . Neuromuscular disorder     DIABETIC NEUROPATHY   . Arthritis      Past Surgical History  Procedure Laterality Date  . Rotator cuff repair Right 1996,1997,2000  . Carpal tunnel release Right   . Tee without cardioversion N/A 04/24/2014    Procedure: TRANSESOPHAGEAL ECHOCARDIOGRAM (TEE);  Surgeon: Vesta MixerPhilip J Nahser, MD;  [facility];  Service: Cardiovascular;  Laterality: N/A;  . Cardiac catheterization      [facility]  . Coronary artery bypass graft N/A 06/06/2014    Procedure: CORONARY ARTERY BYPASS GRAFTING (CABG) x 5 USING LEFT AND RIGHT SAPHENOUS LEG VEIN HARVESTED ENDOSCOPICALLY;  Surgeon: Kerin PernaPeter Van Trigt, MD;  [facility];  Service: Open Heart Surgery;  Laterality: N/A;  . Intraoperative transesophageal echocardiogram N/A 06/06/2014    Procedure: INTRAOPERATIVE TRANSESOPHAGEAL ECHOCARDIOGRAM;  Surgeon: Kerin PernaPeter Van Trigt, MD;  [facility];  Service: Open Heart Surgery;  Laterality: N/A;     Family History  Problem Relation Age of Onset  . Colon polyps Brother   . Lung cancer Brother   . Throat cancer Father   . Stroke Father      History   Social History  . Marital Status: Single    Spouse Name: N/A    Number of Children: N/A  . Years of Education: N/A   Occupational History  . Not on file.   Social History Main Topics  . Smoking status: Former Smoker -- 1.00 packs/day for 25 years    Types: Cigarettes  . Smokeless tobacco: Never Used  . Alcohol Use: No  . Drug Use: No  . Sexual Activity: Not on file   Other Topics Concern  . Not on file   Social History Narrative      PHYSICAL EXAM   BP 124/73 mmHg  Pulse 72  Ht 6' (1.829 m)  Wt 274 lb 8 oz (124.512 kg)  BMI 37.22 kg/m2 Constitutional: He is oriented to person, place, and time. He appears well-developed and well-nourished. No distress.  HENT: No nasal discharge.  Head: Normocephalic and atraumatic.  Eyes: Pupils are equal and round.  No discharge. Neck: Normal range of motion. Neck supple. No JVD present. No thyromegaly present.   Cardiovascular: Normal rate, regular rhythm, normal heart sounds. Exam reveals no gallop and no friction rub. No murmur heard. Well-healing surgical scar. Pulmonary/Chest: Effort normal and breath sounds normal. No stridor. No respiratory distress. He has no wheezes. He has no rales. He exhibits no tenderness.  Abdominal: Soft. Bowel sounds are normal. He exhibits no distension. There is no tenderness. There is no rebound and no guarding.  Musculoskeletal: Normal range of motion. He exhibits trace edema and no tenderness. Bruising at the vein harvest sites. Neurological: He is alert and oriented to person,  place, and time. Coordination normal.  Skin: Skin is warm and dry. No rash noted. He is not diaphoretic. No erythema. No pallor.  Psychiatric: He has a normal mood and affect. His behavior is normal. Judgment and thought content normal.        ZOX:WRUEA rhythm with first-degree AV block and nonspecific T wave changes.   ASSESSMENT AND PLAN

## 2014-06-26 NOTE — Assessment & Plan Note (Signed)
Blood pressure is well controlled on medications. 

## 2014-06-26 NOTE — Assessment & Plan Note (Signed)
Resume atorvastatin but at a smaller dose of 40 mg once daily. Check fasting lipid and liver profile in 6 weeks.

## 2014-06-26 NOTE — Patient Instructions (Signed)
Your physician has recommended you make the following change in your medication:  Stop Metoprolol  Start Atorvastatin 40 mg once daily  Start Coreg 3.125 mg twice daily   Your physician recommends that you return for lab work in:  Fasting labs in 6 weeks. The same day as your appointment with Dr. Kirke Corin   Your physician recommends that you schedule a follow-up appointment in:  6 weeks with Dr. Kirke Corin   Your next appointment will be scheduled in our new office located at :  [facility]  [address]  [place], [place] [address]

## 2014-06-26 NOTE — Assessment & Plan Note (Signed)
He appears to be euvolemic on current dose of Lasix. Given the degree of cardiomyopathy, I switched metoprolol to carvedilol 3.125 mg twice daily. Continue ACE inhibitor. Recheck echocardiogram in 2-3 months to see if an ICD is needed.

## 2014-07-05 ENCOUNTER — Other Ambulatory Visit: Payer: Self-pay | Admitting: *Deleted

## 2014-07-05 MED ORDER — LISINOPRIL 20 MG PO TABS: 20.0000 mg | ORAL_TABLET | Freq: Every morning | ORAL | Status: DC

## 2014-07-05 NOTE — Telephone Encounter (Signed)
Pt walked in office requested refill on lisinopril

## 2014-07-11 ENCOUNTER — Other Ambulatory Visit (INDEPENDENT_AMBULATORY_CARE_PROVIDER_SITE_OTHER): Payer: Medicare PPO

## 2014-07-11 DIAGNOSIS — R609 Edema, unspecified: Secondary | ICD-10-CM

## 2014-07-12 LAB — BASIC METABOLIC PANEL
BUN / CREAT RATIO: 22 (ref 10–22)
BUN: 23 mg/dL (ref 8–27)
CALCIUM: 9.5 mg/dL (ref 8.6–10.2)
CO2: 24 mmol/L (ref 18–29)
CREATININE: 1.04 mg/dL (ref 0.76–1.27)
Chloride: 99 mmol/L (ref 97–108)
GFR calc Af Amer: 83 mL/min/{1.73_m2} (ref >59)
GFR, EST NON AFRICAN AMERICAN: 72 mL/min/{1.73_m2} (ref >59)
Glucose: 71 mg/dL (ref 65–99)
Potassium: 5.4 mmol/L — ABNORMAL HIGH (ref 3.5–5.2)
Sodium: 141 mmol/L (ref 134–144)

## 2014-07-17 ENCOUNTER — Other Ambulatory Visit: Payer: Self-pay | Admitting: Cardiothoracic Surgery

## 2014-07-17 DIAGNOSIS — Z951 Presence of aortocoronary bypass graft: Secondary | ICD-10-CM

## 2014-07-19 ENCOUNTER — Telehealth: Payer: Self-pay

## 2014-07-19 ENCOUNTER — Ambulatory Visit (INDEPENDENT_AMBULATORY_CARE_PROVIDER_SITE_OTHER): Payer: Self-pay | Admitting: Cardiothoracic Surgery

## 2014-07-19 ENCOUNTER — Ambulatory Visit
Admission: RE | Admit: 2014-07-19 | Discharge: 2014-07-19 | Disposition: A | Discharge status: Home / Self Care | Payer: Medicare PPO | Source: Ambulatory Visit | Attending: Cardiothoracic Surgery | Admitting: Cardiothoracic Surgery

## 2014-07-19 ENCOUNTER — Encounter: Payer: Self-pay | Admitting: Cardiothoracic Surgery

## 2014-07-19 VITALS — BP 105/58 | HR 72 | Resp 20 | Ht 72.0 in | Wt 274.0 lb

## 2014-07-19 DIAGNOSIS — Z951 Presence of aortocoronary bypass graft: Secondary | ICD-10-CM

## 2014-07-19 DIAGNOSIS — I25119 Atherosclerotic heart disease of native coronary artery with unspecified angina pectoris: Secondary | ICD-10-CM

## 2014-07-19 IMAGING — CR DG CHEST 2V
2 series · 2 of 2 positions shown · non-contrast
Comparison: Chest x-ray of [DATE]

CLINICAL DATA: History of CABG, followup

EXAM:
CHEST  2 VIEW

[w chest pa]
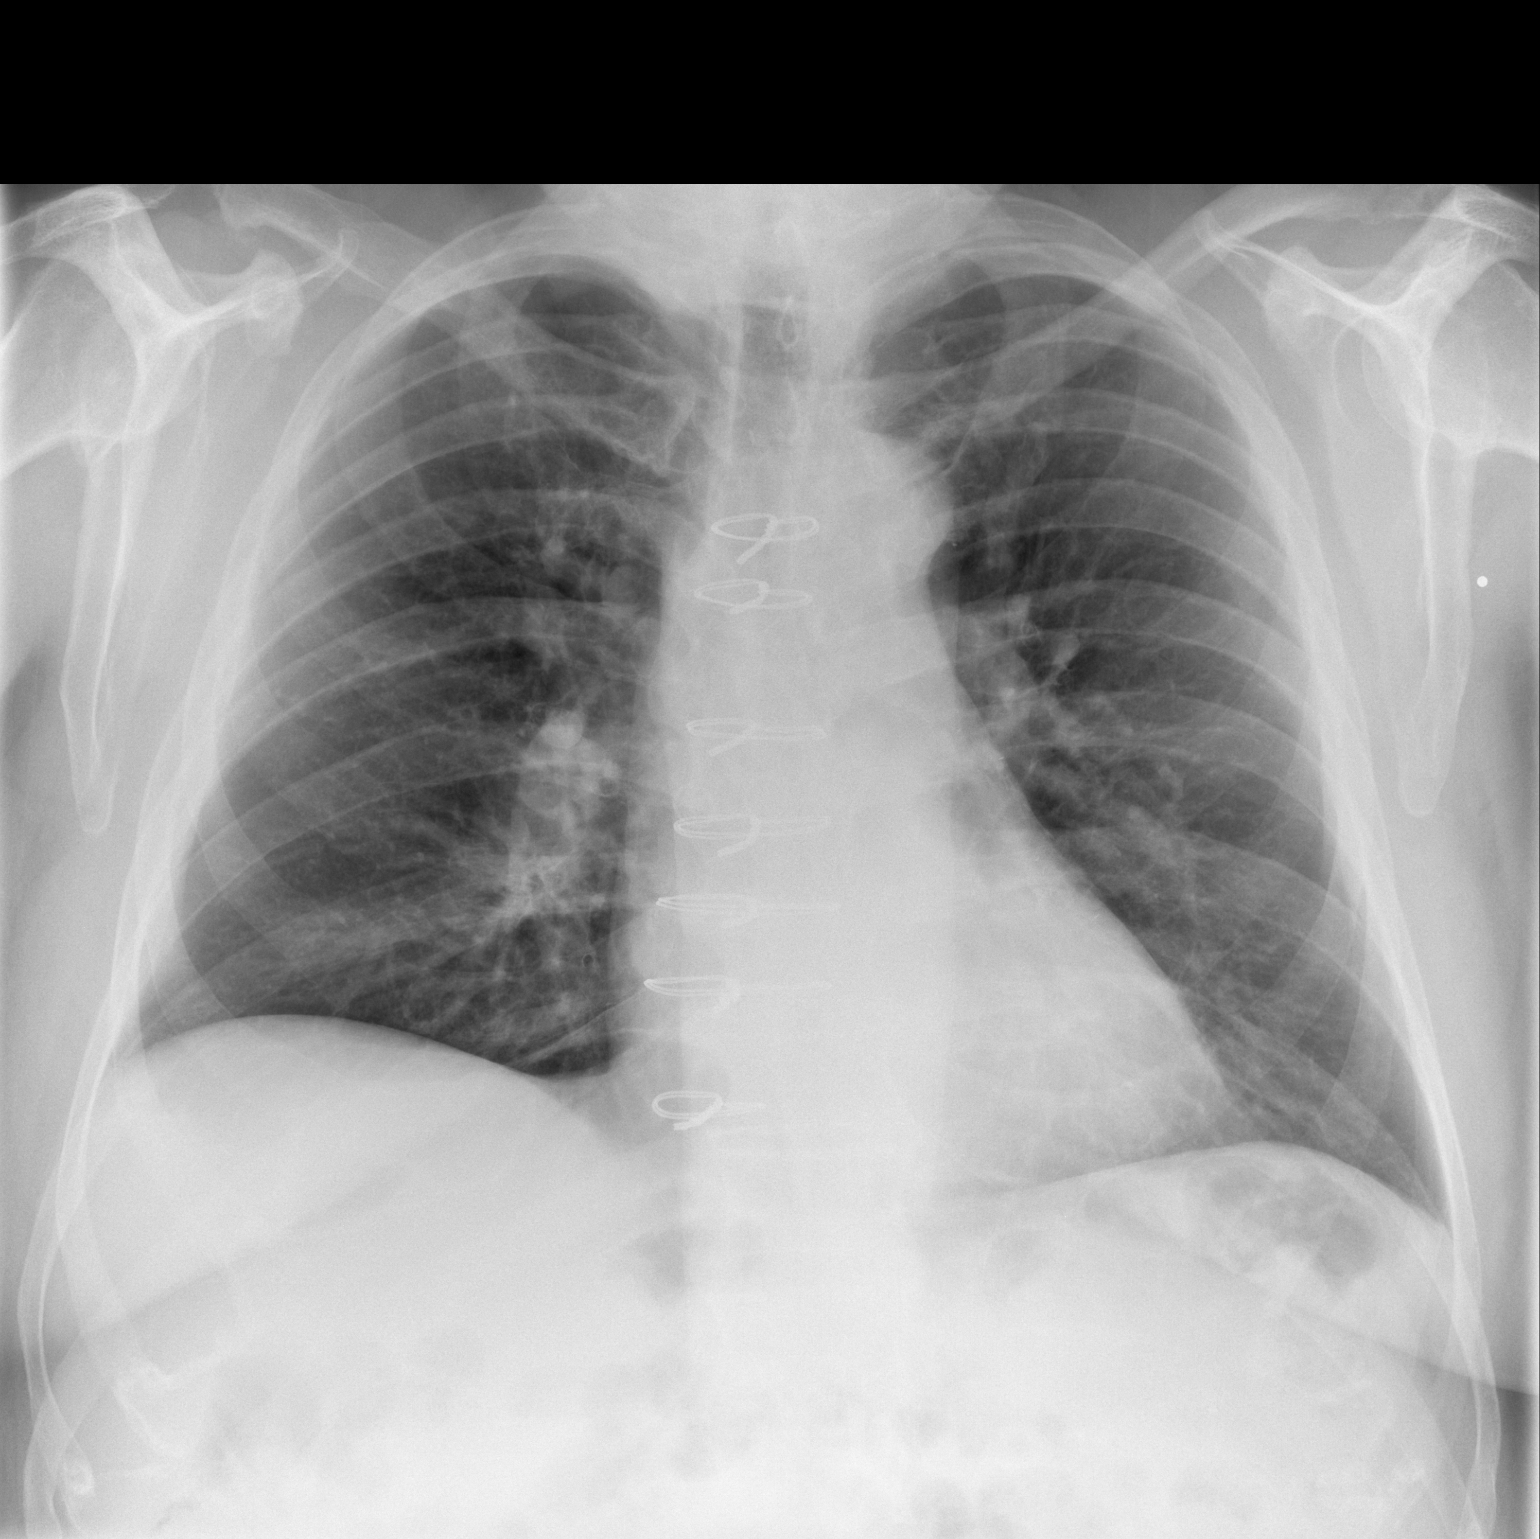

[w chest lat]
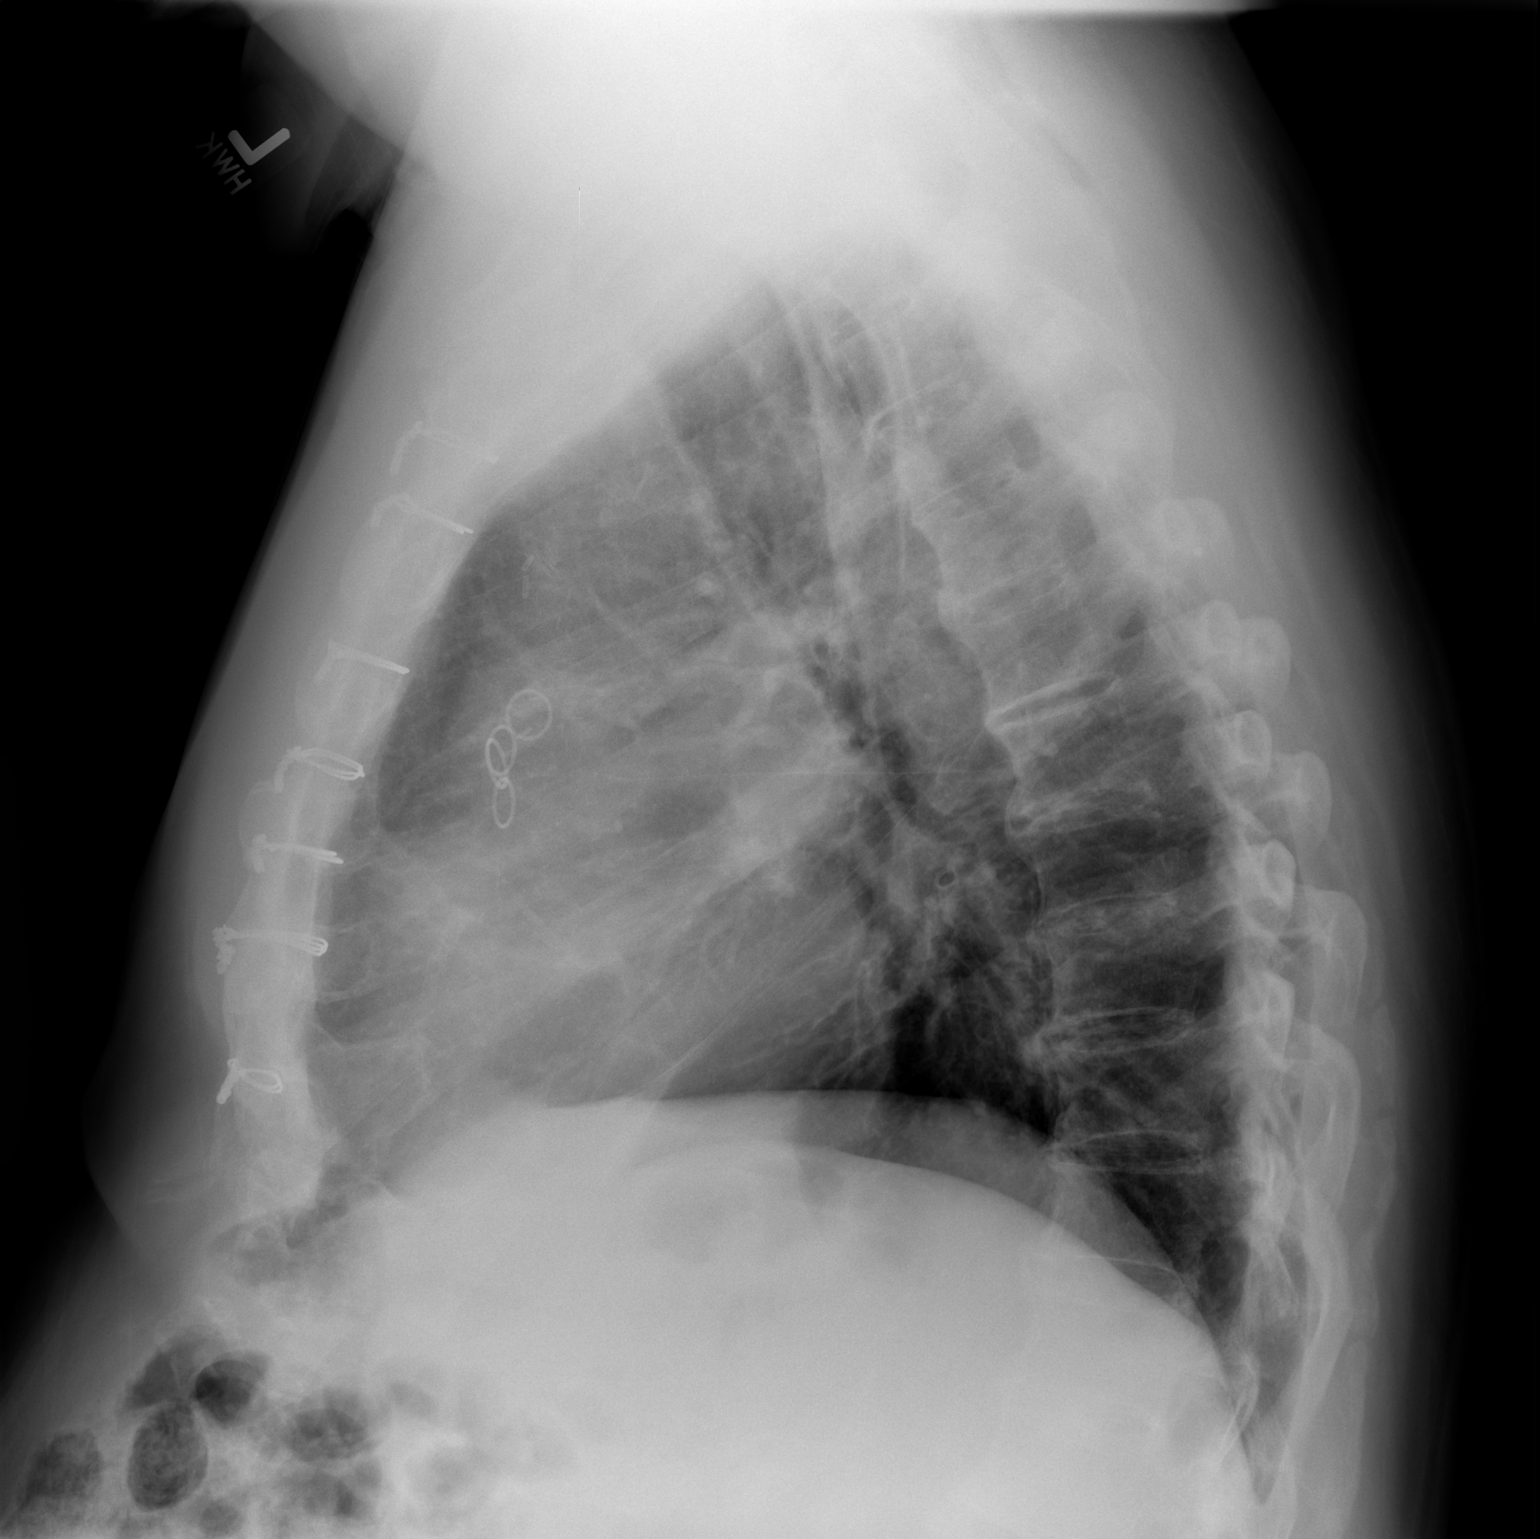

[2 of 2 positions shown; findings below may reference images not displayed]

FINDINGS: Aeration of the lungs has improved. Basilar atelectasis has
resolved, and the small effusions have resolved. Cardiomegaly is
stable. Median sternotomy sutures are noted from prior CABG. There
are diffuse degenerative changes throughout the thoracic spine.
IMPRESSION: Resolution of basilar atelectasis and small effusions.

## 2014-07-19 NOTE — Telephone Encounter (Signed)
This encounter was created in error - please disregard.

## 2014-07-19 NOTE — Telephone Encounter (Signed)
Pt's only contact, Larey Days, called stating that she is no longer a contact for Mr. Goodspeed and for [place] not to call her anymore.  She asks that we contact Beaulah Corin at [phone] instead.

## 2014-07-19 NOTE — Progress Notes (Signed)
PCP is Jerl MinaHEDRICK,JAMES, MD Referring Provider is Iran OuchArida, Muhammad A, MD  Chief Complaint  Patient presents with  . Routine Post Op    F/U from surgery with CXR s/p Coronary artery bypass grafting x5 on 06/06/14    HPI: First office visit postop CABG x5 performed 6 weeks ago. Doing very well walking around [place]. No recurrent angina. Cervical incision is well-healed. No symptoms of CHF. He is low EF. Some leg swelling from the saphenous vein harvest site which response to Lasix. He is maintained sinus rhythm on amiodarone and we will start weaning medication off. Hehas had some gas pains and problems with constipation.he walks 15-20 minutes daily at [employer]. He declines entering hospital based cardiac rehabilitation. [place] back with echo to determine if he needs AICD. He is not taking any pain medication.   Past Medical History  Diagnosis Date  . Hypertension   . Hyperlipidemia   . Gout   . Diabetes mellitus without complication   . Nephrolithiasis   . Umbilical hernia   . Epigastric hernia   . Obesity   . CAD (coronary artery disease)     a. 03/2014 NSTEMI/Cath: LM nl, LAD 3776m - hazy, D1 90p, D2 100, D3 min irregs, LCX 2457m, OM2 95, OM3 90 small, RCA 7023m-->pending further CABG eval.  . Chronic systolic CHF (congestive heart failure)     a. EF 25-30% by 2D ECHO 04/20/14. Severely dec global hypokinesis. mildly elevated RVSP  . AAA (abdominal aortic aneurysm)   . Ischemic cardiomyopathy     a. EF 25-30% 03/2014.  . Mitral regurgitation     a. 03/2014 TEE: mild to mod MR.  . Myocardial infarction   . Neuromuscular disorder     DIABETIC NEUROPATHY  . Arthritis     Past Surgical History  Procedure Laterality Date  . Rotator cuff repair Right 1996,1997,2000  . Carpal tunnel release Right   . Tee without cardioversion N/A 04/24/2014    Procedure: TRANSESOPHAGEAL ECHOCARDIOGRAM (TEE);  Surgeon: Vesta MixerPhilip J Nahser, MD;  [facility];  Service: Cardiovascular;  Laterality: N/A;  . Cardiac catheterization      [facility]  . Coronary artery bypass graft N/A 06/06/2014    Procedure: CORONARY ARTERY BYPASS GRAFTING (CABG) x 5 USING LEFT AND RIGHT SAPHENOUS LEG VEIN HARVESTED ENDOSCOPICALLY;  Surgeon: Kerin PernaPeter Van Trigt, MD;  [facility];  Service: Open Heart Surgery;  Laterality: N/A;  . Intraoperative transesophageal echocardiogram N/A 06/06/2014    Procedure: INTRAOPERATIVE TRANSESOPHAGEAL ECHOCARDIOGRAM;  Surgeon: Kerin PernaPeter Van Trigt, MD;  [facility];  Service: Open Heart Surgery;  Laterality: N/A;    Family History  Problem Relation Age of Onset  . Colon polyps Brother   . Lung cancer Brother   . Throat cancer Father   . Stroke Father     Social History History  Substance Use Topics  . Smoking status: Former Smoker -- 1.00 packs/day for 25 years    Types: Cigarettes  . Smokeless tobacco: Never Used  . Alcohol Use: No    Current Outpatient Prescriptions  Medication Sig Dispense Refill  . amiodarone (PACERONE) 200 MG tablet Take 1 tablet (200 mg total) by mouth 2 (two) times daily. 60 tablet 1  . aspirin EC 325 MG EC tablet Take 1 tablet (325 mg total) by mouth daily. 30 tablet 0  . atorvastatin (LIPITOR) 40 MG tablet Take 1 tablet (40 mg total) by mouth daily. 90 tablet 6  .  carvedilol (COREG) 3.125 MG tablet Take 1 tablet (3.125 mg total) by mouth 2 (two) times daily. 60 tablet 6  . cholecalciferol (VITAMIN D) 1000 UNITS tablet Take 1,000 Units by mouth daily.    . colchicine 0.6 MG tablet Take 0.6 mg by mouth daily as needed (for gout flareup).     . cyanocobalamin 500 MCG tablet Take 500 mcg by mouth daily.    [name] gabapentin (NEURONTIN) 300 MG capsule Take 300 mg by mouth 4 (four) times daily.     [name] lisinopril (PRINIVIL,ZESTRIL) 20 MG tablet Take 1 tablet (20 mg total) by mouth every morning. 90 tablet 3  . metFORMIN (GLUCOPHAGE) 1000 MG tablet Take 1 tablet (1,000 mg total) by mouth 2 (two) times daily  with a meal. 60 tablet 11  . Misc Natural Products (BLACK CHERRY CONCENTRATE PO) Take 1 capsule by mouth daily.    [name] omeprazole (PRILOSEC) 20 MG capsule Take 20 mg by mouth every morning.     . simethicone (MYLICON) 125 MG chewable tablet Chew 125 mg by mouth daily.     No current facility-administered medications for this visit.    No Known Allergies  Review of Systemsstill having some sleep issues. Good appetite and having difficult time was in light  BP 105/58 mmHg  Pulse 72  Resp 20  Ht 6' (1.829 m)  Wt 274 lb (124.286 kg)  BMI 37.15 kg/m2  SpO2 95% Physical Exam Alert and comfortable Lungs clear Heart rate regular Sternum stable well-healed Abdomen soft Leg incision is well-healed Mild-moderate edema right tibial area from vein harvest  Diagnostic Tests: Chest x-ray clear Sternal wires well aligned and intact  Impression: Excellent early recovery after multivessel CABG in this 270 pound gentleman. Change amiodarone to once a day and then stop 1 prescription runs out Take Lasix 40 mg daily as needed for ankle swelling He may now lift up to 20 pounds and may drive his lawnmower-tractor. He cannot lift more than 20 pounds until 3 months after surgery.  Plan:he is encouraged for gradual improvement in his exercise intervals, medical compliance, and dietary compliance. He will return as needed.

## 2014-07-25 ENCOUNTER — Encounter: Payer: Self-pay | Admitting: Internal Medicine

## 2014-08-01 ENCOUNTER — Telehealth: Payer: Self-pay | Admitting: Cardiovascular Disease

## 2014-08-01 NOTE — Telephone Encounter (Signed)
Returning a call we left for patient. About results, on a test pt had. Please call back.

## 2014-08-02 NOTE — Telephone Encounter (Signed)
Patient called to confirm appt and lab

## 2014-08-10 ENCOUNTER — Encounter: Payer: Self-pay | Admitting: Cardiovascular Disease

## 2014-08-10 ENCOUNTER — Ambulatory Visit (INDEPENDENT_AMBULATORY_CARE_PROVIDER_SITE_OTHER): Payer: Medicare PPO | Admitting: Cardiovascular Disease

## 2014-08-10 ENCOUNTER — Other Ambulatory Visit (INDEPENDENT_AMBULATORY_CARE_PROVIDER_SITE_OTHER): Payer: Medicare PPO

## 2014-08-10 VITALS — BP 120/68 | HR 70 | Ht 72.0 in | Wt 279.2 lb

## 2014-08-10 DIAGNOSIS — I5022 Chronic systolic (congestive) heart failure: Secondary | ICD-10-CM

## 2014-08-10 DIAGNOSIS — E785 Hyperlipidemia, unspecified: Secondary | ICD-10-CM

## 2014-08-10 DIAGNOSIS — I10 Essential (primary) hypertension: Secondary | ICD-10-CM

## 2014-08-10 DIAGNOSIS — I25709 Atherosclerosis of coronary artery bypass graft(s), unspecified, with unspecified angina pectoris: Secondary | ICD-10-CM

## 2014-08-10 NOTE — Assessment & Plan Note (Signed)
Continue treatment with atorvastatin. Check fasting lipid and liver profile. 

## 2014-08-10 NOTE — Patient Instructions (Signed)
Continue same medications.   Your physician wants you to follow-up in: 6 months.  You will receive a reminder letter in the mail two months in advance. If you don't receive a letter, please call our office to schedule the follow-up appointment.  

## 2014-08-10 NOTE — Progress Notes (Signed)
HPI  This is a 73 y/o male who is here today for follow-up visit regarding CAD s/p CABG. He was  admitted to [facility] in August with chest pain and ruled in for NSTEMI. Echo showed severe LV dysfxn and cath revealed severe 3VD. He underwent CABG on October 13 by Dr. Donata Clay with LIMA to diagonal, SVG to LAD, SVG to OM1, SVG to OM 2 and SVG to right PDA. Intraoperative TEE showed only mild MR with improvement of ejection fraction to 40-45%.  Postoperative course was remarkable for atrial fibrillation which was treated with amiodarone.  He has been doing very well and denies any chest pain or shortness of breath. He fell yesterday and bruised his lower back. He takes furosemide only as needed and has not required this in the last month.     No Known Allergies   Current Outpatient Prescriptions on File Prior to Visit  Medication Sig Dispense Refill  . amiodarone (PACERONE) 200 MG tablet Take 1 tablet (200 mg total) by mouth 2 (two) times daily. (Patient taking differently: Take 200 mg by mouth daily. ) 60 tablet 1  . aspirin EC 325 MG EC tablet Take 1 tablet (325 mg total) by mouth daily. 30 tablet 0  . atorvastatin (LIPITOR) 40 MG tablet Take 1 tablet (40 mg total) by mouth daily. 90 tablet 6  . carvedilol (COREG) 3.125 MG tablet Take 1 tablet (3.125 mg total) by mouth 2 (two) times daily. 60 tablet 6  . cholecalciferol (VITAMIN D) 1000 UNITS tablet Take 1,000 Units by mouth daily.    . colchicine 0.6 MG tablet Take 0.6 mg by mouth daily as needed (for gout flareup).     . cyanocobalamin 500 MCG tablet Take 500 mcg by mouth daily.    [name] gabapentin (NEURONTIN) 300 MG capsule Take 300 mg by mouth 4 (four) times daily.     [name] lisinopril (PRINIVIL,ZESTRIL) 20 MG tablet Take 1 tablet (20 mg total) by mouth every morning. 90 tablet 3  . metFORMIN (GLUCOPHAGE) 1000 MG tablet Take 1 tablet (1,000 mg total) by mouth 2 (two) times daily with a meal. 60 tablet 11  . Misc Natural Products (BLACK CHERRY  CONCENTRATE PO) Take 1 capsule by mouth daily.    [name] omeprazole (PRILOSEC) 20 MG capsule Take 20 mg by mouth every morning.      No current facility-administered medications on file prior to visit.     Past Medical History  Diagnosis Date  . Hypertension   . Hyperlipidemia   . Gout   . Diabetes mellitus without complication   . Nephrolithiasis   . Umbilical hernia   . Epigastric hernia   . Obesity   . CAD (coronary artery disease)     a. 03/2014 NSTEMI/Cath: LM nl, LAD 68m - hazy, D1 90p, D2 100, D3 min irregs, LCX 6m, OM2 95, OM3 90 small, RCA 66m-->pending further CABG eval.  . Chronic systolic CHF (congestive heart failure)     a. EF 25-30% by 2D ECHO 04/20/14. Severely dec global hypokinesis. mildly elevated RVSP  . AAA (abdominal aortic aneurysm)   . Ischemic cardiomyopathy     a. EF 25-30% 03/2014.  . Mitral regurgitation     a. 03/2014 TEE: mild to mod MR.  . Myocardial infarction   . Neuromuscular disorder     DIABETIC NEUROPATHY  . Arthritis      Past Surgical History  Procedure Laterality Date  . Rotator cuff repair Right 1996,1997,2000  . Carpal  tunnel release Right   . Tee without cardioversion N/A 04/24/2014    Procedure: TRANSESOPHAGEAL ECHOCARDIOGRAM (TEE);  Surgeon: Vesta MixerPhilip J Nahser, MD;  [facility];  Service: Cardiovascular;  Laterality: N/A;  . Cardiac catheterization      [facility]  . Coronary artery bypass graft N/A 06/06/2014    Procedure: CORONARY ARTERY BYPASS GRAFTING (CABG) x 5 USING LEFT AND RIGHT SAPHENOUS LEG VEIN HARVESTED ENDOSCOPICALLY;  Surgeon: Kerin PernaPeter Van Trigt, MD;  [facility];  Service: Open Heart Surgery;  Laterality: N/A;  . Intraoperative transesophageal echocardiogram N/A 06/06/2014    Procedure: INTRAOPERATIVE TRANSESOPHAGEAL ECHOCARDIOGRAM;  Surgeon: Kerin PernaPeter Van Trigt, MD;  [facility];  Service: Open Heart Surgery;  Laterality: N/A;     Family History  Problem Relation Age of Onset  . Colon polyps Brother   . Lung  cancer Brother   . Throat cancer Father   . Stroke Father      History   Social History  . Marital Status: Single    Spouse Name: N/A    Number of Children: N/A  . Years of Education: N/A   Occupational History  . Not on file.   Social History Main Topics  . Smoking status: Former Smoker -- 1.00 packs/day for 25 years    Types: Cigarettes  . Smokeless tobacco: Never Used  . Alcohol Use: No  . Drug Use: No  . Sexual Activity: Not on file   Other Topics Concern  . Not on file   Social History Narrative      PHYSICAL EXAM   BP 120/68 mmHg  Pulse 70  Ht 6' (1.829 m)  Wt 279 lb 4 oz (126.667 kg)  BMI 37.86 kg/m2 Constitutional: He is oriented to person, place, and time. He appears well-developed and well-nourished. No distress.  HENT: No nasal discharge.  Head: Normocephalic and atraumatic.  Eyes: Pupils are equal and round.  No discharge. Neck: Normal range of motion. Neck supple. No JVD present. No thyromegaly present.  Cardiovascular: Normal rate, regular rhythm, normal heart sounds. Exam reveals no gallop and no friction rub. No murmur heard. Well-healing surgical scar. Pulmonary/Chest: Effort normal and breath sounds normal. No stridor. No respiratory distress. He has no wheezes. He has no rales. He exhibits no tenderness.  Abdominal: Soft. Bowel sounds are normal. He exhibits no distension. There is no tenderness. There is no rebound and no guarding.  Musculoskeletal: Normal range of motion. He exhibits trace edema and no tenderness. Bruising at the vein harvest sites. Neurological: He is alert and oriented to person, place, and time. Coordination normal.  Skin: Skin is warm and dry. No rash noted. He is not diaphoretic. No erythema. No pallor.  Psychiatric: He has a normal mood and affect. His behavior is normal. Judgment and thought content normal.         ASSESSMENT AND PLAN

## 2014-08-10 NOTE — Assessment & Plan Note (Signed)
He has been doing extremely well with no symptoms suggestive of angina. He refuses cardiac rehabilitation. Continue medical therapy.

## 2014-08-10 NOTE — Assessment & Plan Note (Signed)
He appears to be euvolemic without any diuretic. Most recent ejection fraction was 40-45%. Continue treatment with carvedilol and lisinopril.

## 2014-08-10 NOTE — Assessment & Plan Note (Signed)
Blood pressure is well controlled on current medications. 

## 2014-08-11 LAB — HEPATIC FUNCTION PANEL
ALT: 20 IU/L (ref 0–44)
AST: 18 IU/L (ref 0–40)
Albumin: 4.4 g/dL (ref 3.5–4.8)
Alkaline Phosphatase: 101 IU/L (ref 39–117)
BILIRUBIN TOTAL: 0.2 mg/dL (ref 0.0–1.2)
Bilirubin, Direct: 0.08 mg/dL (ref 0.00–0.40)
Total Protein: 6.7 g/dL (ref 6.0–8.5)

## 2014-08-11 LAB — LIPID PANEL
CHOL/HDL RATIO: 3.8 ratio (ref 0.0–5.0)
Cholesterol, Total: 105 mg/dL (ref 100–199)
HDL: 28 mg/dL — ABNORMAL LOW (ref >39)
LDL CALC: 53 mg/dL (ref 0–99)
Triglycerides: 119 mg/dL (ref 0–149)
VLDL Cholesterol Cal: 24 mg/dL (ref 5–40)

## 2014-08-25 ENCOUNTER — Encounter: Payer: Self-pay | Admitting: Internal Medicine

## 2014-11-21 ENCOUNTER — Telehealth: Payer: Self-pay | Admitting: *Deleted

## 2014-11-21 NOTE — Telephone Encounter (Signed)
Pt wife calling stating the last couple days. Right leg is swollen and tightening up. Having trouble breathing not constant just here and there. Right leg has been swollen for the last few days. Is taking the lasik yesterday. 1 tablet a day.  Has not traveled any where.  The breathing only happens when he is doing something. When he walks and night when he goes to bed.  try's and walk daily.  Please advise.

## 2014-11-21 NOTE — Telephone Encounter (Signed)
  Pt wife calling stating the last couple days. Patient has been having swelling of the right leg for the past few days Patient is also having sob with activity and at night  He has been taking 40 mg of Lasix once daily

## 2014-11-28 NOTE — Telephone Encounter (Signed)
Schedule a follow up appointment with [name].

## 2014-11-29 NOTE — Telephone Encounter (Signed)
Can we set this up for him if [name] has a schedule open?

## 2014-12-04 NOTE — Telephone Encounter (Signed)
Called patient and offered him an apt for today for we only had an opening today pt was not able to come so I made him the apt of may 2nd and 11:15am And added pt to wait list.

## 2014-12-15 NOTE — H&P (Signed)
PATIENT NAME:  [name] [name], [name] [name] MR#:  [id number] DATE OF BIRTH:  1941/05/18  DATE OF ADMISSION:  05/16/2013  PRIMARY CARE PHYSICIAN: Dr. [name].   CHIEF COMPLAINT: Chest; epigastric pain.   HISTORY OF PRESENTING ILLNESS: A 73 year old male patient with a history of diabetes mellitus, hypertension, hiatal hernia, diabetic neuropathy, presents to the hospital sent in by his  primary care physician after the patient had an acute onset of chest and epigastric pain yesterday night. This resolved after about an hour, and the patient took some baking soda had a large burp. The patient had similar pain about 4 to 5 months back.   On getting an EKG, his primary care physician found the patient to have bigeminy and also multiple PVCs, and was referred to the Emergency Room. Here, his EKG shows PVCs. Cardiac enzymes are normal. The patient is chest pain-free, but secondary to risk factors and the chest pain the patient is being admitted to rule out acute coronary syndrome.   His chest pain did radiate to the left shoulder area, but he mentions that he has rotator cuff problems and has had left shoulder pain on and off since 1996. He did not have any shortness of breath. Did have nausea. No sweating. No lightheadedness or syncope.   He had a stress test about 7 years back which had to be discontinued as he could not run on the treadmill to achieve maximum heart rate.   PAST MEDICAL HISTORY: 1.  Hypertension.  2.  Diabetes mellitus.  3.  Neuropathy.  4.  Hiatal hernia.   PAST SURGICAL HISTORY: Right shoulder surgery for rotator cuff problems.   FAMILY HISTORY: Hypertension, diabetes.   SOCIAL HISTORY: The patient does not smoke. No alcohol. No illicit drugs. Used to work in [employer] in the past, but he is retired.   CODE STATUS: FULL CODE.   ALLERGIES: No known drug allergies.   HOME MEDICATIONS: Include:  1.  Amlodipine 5 mg oral daily.  2.  Aspirin 81 mg daily.  3.  Gabapentin 300 mg  oral 3 to 4 times a day.  4.  Lisinopril 20 mg daily.  5.  Metformin 1000 mg 2 times a day.  6.  Potassium citrate 2 tablets oral 2 times a day.  7.  Prilosec 20 mg daily.  8.  Simvastatin 40 mg daily.   REVIEW OF SYSTEMS:  No fever, fatigue, weakness.   EYES: No blurred vision, pain, or redness.  ENT: No tinnitus, ear pain, or hearing loss. RESPIRATORY: No cough, wheeze, hemoptysis.  CARDIOVASCULAR: No chest pain. No orthopnea, edema.  GASTROINTESTINAL: No nausea, vomiting, diarrhea, abdominal pain.  GENITOURINARY: No dysuria, hematuria, frequency.  ENDOCRINE: No polyuria, nocturia, thyroid dysfunction.  HEMATOLOGIC/LYMPHATIC: No anemia, easy bruising, bleeding.  INTEGUMENTARY: No acne, rash, lesions.  MUSCULOSKELETAL: No back pain, arthritis.  NEUROLOGICAL: No focal numbness, weakness, dysarthria.  PSYCHIATRIC: No anxiety or depression.   PHYSICAL EXAMINATION: VITAL SIGNS: Shows a temperature of 97.5, pulse 55, respirations 20, blood pressure 135/66, saturating 92% on room air.  GENERAL: Morbidly obese Caucasian male patient lying in bed, comfortable, conversational, cooperative with exam.  PSYCHIATRIC: Alert and oriented x 3. Mood and affect appropriate. Judgment intact.   HEENT: Atraumatic, normocephalic, mucous membranes moist and pink. External ears and nose normal. No trauma. No pallor. No icterus. Pupils bilaterally equal and react to light.  NECK: Supple. No thyromegaly. No palpable lymph nodes. Trachea midline. No carotid bruit, JVD.  CARDIOVASCULAR: S1, S2, without any murmurs. Peripheral pulses  2+. No edema. No chest wall tenderness.  RESPIRATORY: Normal work of breathing. Clear to auscultation on both sides.  GASTROINTESTINAL: Soft abdomen, nontender. Bowel sounds present. No visceromegaly palpable. Has a ventral hernia.  GENITOURINARY: No CVA tenderness or bladder distention.  SKIN: Warm and dry. No petechial rash or ulcers.  MUSCULOSKELETAL: No joint swelling,  redness, effusion of the large joints. Normal muscle tone.  NEUROLOGICAL: Motor strength 5/5 in upper and lower extremities. Sensation to fine touch is intact all over.  LYMPHATIC: No cervical lymphadenopathy.   LAB STUDIES: Show a glucose of 113, BUN 24, creatinine 1.2.   Sodium 138, potassium 4.5. Troponin less than 0.02.   WBC 12.5, hemoglobin 15.2, platelets of 204.   EKG shows normal sinus rhythm and frequent PVCs. No acute ST-T wave changes.   Chest x-ray shows no acute cardiopulmonary disease.   ASSESSMENT AND PLAN: 1.  Chest pain in a patient with significant risk factors, with hypertension, diabetes. His pain could be likely secondary from the hiatal hernia, but will rule out acute coronary syndrome. Get 2 more sets of cardiac enzymes. Schedule the patient for a stress test. He will be on a telly floor. The patient needs increased twice-daily proton pump inhibitors if his stress test is negative and his pain is deemed to be from a hiatal hernia.  2.  Diabetes mellitus: Continue home medications.  3. Hypertension: Continue home medications.  4.  Deep venous thrombosis prophylaxis with Lovenox.  5.  CODE STATUS: FULL CODE.   Total time spent on this case: 35 minutes.    ____________________________ Molinda Bailiff Marah Park, MD srs:dm D: 05/16/2013 12:49:52 ET T: 05/16/2013 13:34:51 ET JOB#: [id number]    cc: [name], MD, <Dictator> [name], MD [name] MD ELECTRONICALLY SIGNED 05/16/2013 15:27

## 2014-12-15 NOTE — Discharge Summary (Signed)
PATIENT NAME:  [name] [name], [name] [name] MR#:  [id number] DATE OF BIRTH:  1941-02-20  DATE OF ADMISSION:  05/16/2013 DATE OF DISCHARGE:  05/17/2013  PRIMARY CARE PHYSICIAN: [name], MD  FINAL DIAGNOSES: 1.  Chest pain.  2.  Hypertension.  3.  Diabetes.  4.  Obesity.   MEDICATIONS ON DISCHARGE: Include Prilosec OTC 20 mg daily, aspirin 81 mg daily, simvastatin 40 mg daily, gabapentin 300 mg 3 to 4 times a day, amlodipine 5 mg daily, metformin 1000 mg twice a day, lisinopril 20 mg daily, potassium citrate 10 mEq twice a day.   DIET: Regular consistency, low-sodium, carbohydrate-controlled diet.   ACTIVITY: As tolerated.   DISCHARGE INSTRUCTIONS: If any further chest pain can call [facility]. Follow up in 1 to 2 weeks with Dr. [name].   HOSPITAL COURSE: The patient was admitted as an observation 05/16/2013, discharged 05/17/2013, came in with chest pain and was sent in from primary care physician's office. He was admitted as an observation. Serial cardiac enzymes were ordered and a stress test was ordered.   Laboratory and radiological data during the hospital course included an EKG that showed sinus rhythm, premature ventricular complexes. Chest x-ray: No acute cardiopulmonary disease. Magnesium 2.2. Troponin x 3 were negative. White blood cell count 12.5, hemoglobin and hematocrit 15.2 and 46.8, platelet count of 204. Glucose 113, BUN 24, creatinine 1.2, sodium 138, potassium 4.5, chloride 105, CO2 of 28. Stress test was a low-risk scan, left ventricular function normal. Increased GI uptake noted. Case was discussed with Dr. Mariah Milling, cardiology, who believed this was a low-risk scan. If the patient has any further symptoms, since the pictures were not the greatest secondary to the patient's size and GI uptake, can consider a cardiac catheterization as outpatient if any further symptoms, but I do believe this is GI-related in nature. The patient does complain of a lot of gas. Once the  gas leaves, then the patient's symptoms are gone.   TIME SPENT ON DISCHARGE: 35 minutes.   ____________________________ Herschell Dimes. Renae Gloss, MD rjw:jm D: 05/17/2013 15:57:11 ET T: 05/17/2013 18:50:12 ET JOB#: [id number]  cc: Herschell Dimes. Renae Gloss, MD, <Dictator> [name], MD Salley Scarlet MD ELECTRONICALLY SIGNED 05/19/2013 14:46
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
## 2014-12-16 NOTE — H&P (Signed)
PATIENT NAME:  [name] [name], [name] [name] MR#:  [id number] DATE OF BIRTH:  1941-05-05  DATE OF ADMISSION:  04/20/2014  REFERRING PHYSICIAN: Dr. Chiquita Loth.   PRIMARY CARE PHYSICIAN: Dr. [name].   CHIEF COMPLAINT: Shortness of breath.  HISTORY OF PRESENT ILLNESS: A 74 year old Caucasian gentleman with history of hypertension, diabetes, presenting with shortness of breath. Describes a 4 to 5 day duration of   shortness of breath, mainly described as dyspnea on exertion with associated orthopnea requiring to sleep in a recliner as well as worsening lower extremity edema up to his knees bilaterally which has been progressively worsening. He had an episode of chest pain, which was retrosternal in location nonradiating, dominant in quality, 5 to 6 out of 10 in intensity. No worsening or relieving factors. He can belch thought this was secondary to his hiatal hernia, but no relief of symptoms. Thus called EMS. On EMS arrival, noted to be markedly hypertensive with a blood pressure up in the 230 range on ER arrival time to be hypoxemic saturating 80% on room air requiring BiPAP therapy. Currently, he is on BiPAP, with improved O2 saturations and now able to speak in full sentences.   REVIEW OF SYSTEMS:  CONSTITUTIONAL: Denies fever, fatigue, weakness.  EYES: Denies blurred vision, double vision, or eye pain.  EARS, NOSE, THROAT: Denies tinnitus, ear pain, hearing loss.  RESPIRATORY: Denies cough, wheeze. Positive for shortness of breath.  CARDIOVASCULAR: Positive for chest pain as described above as well as orthopnea and edema. Denies any palpitations.  GASTROINTESTINAL: Denies nausea, vomiting, diarrhea, abdominal pain.  GENITOURINARY: Denies dysuria, hematuria.  ENDOCRINE: Denies nocturia or thyroid problems.  HEMATOLOGIC AND LYMPHATIC: Denies easy bruising or bleeding.   SKIN: No rash or lesions.   MUSCULOSKELETAL: Denies pain in neck, back, shoulder, knees, hips or arthritic symptoms.  NEUROLOGIC: Denies  paralysis, paresthesias.  PSYCHIATRIC: Denies anxiety or depressive symptoms.  Otherwise, full review of systems performed by me is negative.   PAST MEDICAL HISTORY: Hypertension, type 2 diabetes non-insulin-requiring complicated by peripheral neuropathy as well as hiatal hernia.   FAMILY HISTORY: For diabetes, as well as hypertension.   SOCIAL HISTORY: Remote alcohol use. Denies any tobacco use or drug use.   ALLERGIES: No known drug allergies.   HOME MEDICATIONS: Include aspirin 81 mg p.o. q. daily, ibuprofen 800 mg p.o. 3 times daily as needed for pain, lisinopril 20 mg p.o. q. daily, gabapentin 3 mg p.o. 3 times daily, metformin 1000 mg p.o. b.i.d., colchicine 0.6 mg b.i.d., simvastatin 40 mg of the morning and 40 mg in the evening, atenolol 25 mg p.o. daily, Norvasc 5 mg p.o. daily, Prilosec 20 mg p.o. daily, potassium 10 mEq by mouth twice daily, vitamin D2 50,000 international units p.o. q. monthly.   PHYSICAL EXAMINATION:  VITAL SIGNS: Temperature 98.9, heart rate 118, respirations 27, blood pressure 173/99, saturating 95% on BiPAP therapy, weight 127 kg, BMI 41.4.  GENERAL: Obese Caucasian gentleman in moderate respiratory distress, requiring BiPAP therapy.  HEAD: Normocephalic, atraumatic.  EYES: Pupils equal, round, and reactive to light. Extraocular muscles intact. No scleral icterus.  MOUTH: Moist mucous membranes. Dentition intact. No abscess noted.  EARS, NOSE, AND THROAT: Clear without exudates. No external lesions.  NECK: Supple. No thyromegaly. No nodules. No JVD.  PULMONARY: Grossly diminished breath sounds throughout all lung fields with bibasilar crackles. Noted, he is tachypneic on BiPAP. No use of accessory muscles. Good respiratory effort on BiPAP therapy.  CHEST: Nontender to palpation.  CARDIOVASCULAR: S1, S2, tachycardic. No murmurs,  rubs, or gallops, 2+ edema to the knees bilaterally. Pedal pulses 2+ bilaterally.  GASTROINTESTINAL: Soft, nontender, nondistended.  No masses. There is a palpable ventral hernia noted that is reducible. Positive bowel sounds. No hepatosplenomegaly. Obese.  MUSCULOSKELETAL: No swelling or clubbing. Positive for edema as described above. Range of motion full in all extremities.  NEUROLOGIC: Cranial nerves II through XII intact. No gross focal neurologic deficits. Sensation intact. Reflexes intact.   SKIN: No ulceration, lesions, rash, cyanosis. Skin warm, dry. Turgor intact.  PSYCHIATRIC: Mood and affect within normal limits. The patient alert and oriented x 3. Insight and judgment within normal limits.   LABORATORY DATA: Chest x-ray performed, reveals sinus consistent with acute congestive heart failure, and mild cardiomegaly. Remainder of laboratory data: Sodium 147, potassium 4, chloride 106, bicarbonate 32, BUN 20, creatinine 1.22, glucose 170. BNP 4,414. LFTs within normal limits. Troponin 0.02. WBC 13.7, hemoglobin 15.5, platelets of 185,000. ABG performed, pH 7.39, CO2 of 51, O2 of 82, bicarbonate 30.9 on BiPAP 16/6 with a rate of 8. EKG performed. No ST or T wave abnormalities. Heparin drip multiple PVCs noted.   ASSESSMENT AND PLAN: A 74 year old gentleman with history of hypertension as well as type 2 diabetes on oral agents complicated by peripheral neuropathy presenting with shortness of breath.   1. Acute respiratory failure with hypoxemia as indicated by oxygen saturation as well as requirement of BiPAP and inability to speak in full sentences on arrival. This is secondary to acute congestive heart failure with unknown ejection fraction. Continue BiPAP therapy. Wean as tolerated. Continue supplemental oxygen to keep oxygen saturation greater than 92%. DuoNeb treatments q. 4 hours, provide diuresis with Lasix 40 mg IV daily, follow ins and outs, urine output and renal function. We will trend cardiac enzymes x 3. Place on telemetry and check a transthoracic echocardiogram to assess ejection fraction. We will also consult  cardiology as previously followed with Dr. Mariah Milling in the hospital, however, has not seen him as an outpatient.  2. Hypertensive emergency with congestive heart failure. His symptoms and blood pressure have improved in the Emergency Department; however, he still remains hypertensive. We will restart his home medication as well as p.r.n. hydralazine with goal blood pressures less than 180/100.  3. Diabetes type 2. Hold p.o. agents. Add insulin sliding scale with q. 6 hour Accu-Cheks.  4. Venous thromboembolism prophylaxis with heparin subcutaneous.   CODE STATUS: The patient is full code.   TIME SPENT: 45 minutes.    ____________________________ Cletis Athens. Keeven Matty, MD dkh:JT D: 04/20/2014 04:10:22 ET T: 04/20/2014 04:35:55 ET JOB#: [id number]  cc: Cletis Athens. Wyvonne Carda, MD, <Dictator> Shoaib Siefker Synetta Shadow MD ELECTRONICALLY SIGNED 04/20/2014 20:28

## 2014-12-16 NOTE — Discharge Summary (Signed)
PATIENT NAME:  [name] [name], [name] [name] MR#:  [id number] DATE OF BIRTH:  20-Sep-1940  DATE OF ADMISSION:  04/20/2014  DATE OF DISCHARGE:  04/21/2014    DISPOSITION:  The patient is to be transferred to [facility] for further management.   DISCHARGE DIAGNOSES:  1.  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  2.  Hypertensive emergency.  3.  Diabetes.  MEDICATION ADVISED ON DISCHARGE:  1.  Amlodipine 5 mg oral once a day.  2.  Colchicine 0.6 mg 2 times a day.  3.  Ibuprofen 800 mg 3 times a day.   5.  Simvastatin 40 mg once a day.  6.  Aspirin 81 mg once a day.  7.  Gabapentin 300 mg 3 to 4 times a day.  8.  Metformin 1000 mg 2 times a day.  9.  Lisinopril 20 mg once a day.  10. Albuterol and ipratropium inhalation 3 times a day.   DIET ON DISCHARGE: Low-sodium, low-fat, low-cholesterol, carbohydrate-controlled ADA diet, regular consistency.   ACTIVITY: As tolerated.   TIMEFRAME TO FOLLOW-UP: Within 1 to 2 days of being transferred to [facility] for CABG; further management as per them,   HISTORY OF PRESENT ILLNESS: A 74 year old male with a history of hypertension and diabetes presented with shortness of breath described as 4 to 5 days duration, dyspnea on exertion with associated orthopnea and requiring to sleep in the recliner, worsening lower extremity edema up to his knee bilaterally and had episode of chest pain retrosternal, nonradiating 5 to 6 out of 10. No worsening or relieving factor, so finally called EMS and on EMS arrival noticed markedly elevated high blood pressure.  The pressure was up to 230 range and on ER evaluation he was noticed also to be hypoxic.  Saturation was 80% on room air, so started on BiPAP therapy.  He was not able to speak full sentences on evaluation.    HOSPITAL COURSE AND STAY:  He was found to have severe CHF and hypertensive  emergency, given IV Lasix and hypertensive medications.  Blood pressure came under control and he had some diuresis initially and for a few hours required BiPAP, but after that became more comfortable and was tolerating 4 liters oxygen nasal cannula supplementation.  He also was found to be having acute systolic heart failure.  His ejection fraction was severe at 25% to 30% and his troponin was slightly elevated, so cardiology consult was called in and they suggested to do cardiac cath to find out the cause of acute CHF and elevated troponin.  After continued therapy with IV Lasix, 2 times a day, receiving a total of 3 doses, he felt significantly better the next day.  His leg edema, improved as well as his oxygen requirement.  He was very comfortable on 2 liters oxygen saturating up to 96% to 97% with nasal cannula 2 liters.   Elevated troponin with severe hypokinesis on echocardiogram, ejection fraction 25% to 30%. The patient was taken for cardiac catheterization.  He was initially started on Lovenox therapeutic dose subcutaneous b.i.d. on admission and seen by cardiologist and was scheduled for cardiac cath, which was done today the 2nd day in the hospital and found having severe triple vessel disease.  The plan is to proceed with coronary artery bypass graft surgery and he is being transferred to a tertiary care center   Hypertensive emergency with congestive heart failure, responded nicely with  carvedilol injection, Lasix and started on lisinopril and amlodipine.  Also maintained on hydralazine as needed basis, injections.   Diabetes on oral medications. We put the oral medications on hold and kept him on sliding scale coverage.     CONSULT IN THE HOSPITAL: Julien Nordmann, MD and Mady Gemma, MD for cardiology consult.   IMPORTANT LABORATORY RESULTS IN THE HOSPITAL:  Chest x-ray, portable on arrival was interval changes, acute congestive heart failure and mild cardiomegaly.    BUN 20, creatinine  1.22, sodium 147, potassium 4, chloride 106, CO2 of 32, and calcium is 8.8, bilirubin 0.5.  WBC 13.7, hemoglobin 15.5, platelet count is 185, MCV is 91. Troponin 0.02.  On ABG, pH is 7.39, pCO2 of 51 and pO2 of 82. FiO2 is 40.  Lactic acid is 1.8 cm.  Serum magnesium 1.4. Troponin 0.81 and on further follow-up 1.4.  Echocardiogram reported, moderate to severely decreased global left ventricular systolic function, left ventricular ejection fraction grossly 25% to 30%, severe anterior and anteroseptal wall hypokinesia.  Unable to exclude other wall motion abnormality, mildly increased left ventricular internal cavity size.   Next day, BUN was 30, creatinine 1.39, sodium 142, potassium is 3.7, chloride is 97, CO2 is 39. Cardiac catheterization is done; significant triple-vessel coronary artery disease, ejection fraction was 30%.   TOTAL TIME SPENT ON THIS DISCHARGE SUMMARY AND DISCHARGE PROCESS: 40 minutes.     ____________________________ Hope Pigeon Elisabeth Pigeon, MD vgv:DT D: 04/21/2014 15:08:19 ET T: 04/21/2014 15:39:39 ET JOB#: [id number]  cc: Hope Pigeon. Elisabeth Pigeon, MD, <Dictator> Altamese Dilling MD ELECTRONICALLY SIGNED 04/30/2014 9:17

## 2014-12-25 ENCOUNTER — Ambulatory Visit (INDEPENDENT_AMBULATORY_CARE_PROVIDER_SITE_OTHER): Payer: Medicare PPO | Admitting: Cardiovascular Disease

## 2014-12-25 ENCOUNTER — Encounter: Payer: Self-pay | Admitting: Cardiovascular Disease

## 2014-12-25 VITALS — BP 96/50 | HR 74 | Ht 72.0 in | Wt 296.0 lb

## 2014-12-25 DIAGNOSIS — I5021 Acute systolic (congestive) heart failure: Secondary | ICD-10-CM

## 2014-12-25 DIAGNOSIS — I493 Ventricular premature depolarization: Secondary | ICD-10-CM

## 2014-12-25 DIAGNOSIS — I4891 Unspecified atrial fibrillation: Secondary | ICD-10-CM

## 2014-12-25 DIAGNOSIS — I25709 Atherosclerosis of coronary artery bypass graft(s), unspecified, with unspecified angina pectoris: Secondary | ICD-10-CM

## 2014-12-25 DIAGNOSIS — E785 Hyperlipidemia, unspecified: Secondary | ICD-10-CM

## 2014-12-25 MED ORDER — FUROSEMIDE 40 MG PO TABS: 40.0000 mg | ORAL_TABLET | Freq: Every day | ORAL | Status: DC

## 2014-12-25 NOTE — Progress Notes (Signed)
HPI  This is a 74 y/o male who is here today for follow-up visit regarding CAD s/p CABG. He was  admitted to [facility] in August with chest pain and ruled in for NSTEMI. Echo showed severe LV dysfxn and cath revealed severe 3VD. He underwent CABG on October 13 by Dr. Donata Clay with LIMA to diagonal, SVG to LAD, SVG to OM1, SVG to OM 2 and SVG to right PDA. Intraoperative TEE showed only mild MR with improvement of ejection fraction to 40-45%.  Postoperative course was remarkable for atrial fibrillation which was treated with amiodarone.  He was taking Lasix only as needed and not required for few months. However, over the last month, he has noted progressive shortness of breath and lower extremity edema. He gained 15 pounds. He has not been taking Lasix. He denies any chest pain but overall has not been feeling well.     No Known Allergies   Current Outpatient Prescriptions on File Prior to Visit  Medication Sig Dispense Refill  . amiodarone (PACERONE) 200 MG tablet Take 1 tablet (200 mg total) by mouth 2 (two) times daily. (Patient taking differently: Take 200 mg by mouth daily. ) 60 tablet 1  . aspirin EC 325 MG EC tablet Take 1 tablet (325 mg total) by mouth daily. 30 tablet 0  . atorvastatin (LIPITOR) 40 MG tablet Take 1 tablet (40 mg total) by mouth daily. 90 tablet 6  . carvedilol (COREG) 3.125 MG tablet Take 1 tablet (3.125 mg total) by mouth 2 (two) times daily. 60 tablet 6  . colchicine 0.6 MG tablet Take 0.6 mg by mouth daily as needed (for gout flareup).     . cyanocobalamin 500 MCG tablet Take 500 mcg by mouth daily.    [name] gabapentin (NEURONTIN) 300 MG capsule Take 300 mg by mouth 4 (four) times daily.     [name] lisinopril (PRINIVIL,ZESTRIL) 20 MG tablet Take 1 tablet (20 mg total) by mouth every morning. 90 tablet 3  . metFORMIN (GLUCOPHAGE) 1000 MG tablet Take 1 tablet (1,000 mg total) by mouth 2 (two) times daily with a meal. 60 tablet 11  . Misc Natural Products (BLACK CHERRY  CONCENTRATE PO) Take 1 capsule by mouth daily.     No current facility-administered medications on file prior to visit.     Past Medical History  Diagnosis Date  . Hypertension   . Hyperlipidemia   . Gout   . Diabetes mellitus without complication   . Nephrolithiasis   . Umbilical hernia   . Epigastric hernia   . Obesity   . CAD (coronary artery disease)     a. 03/2014 NSTEMI/Cath: LM nl, LAD 32m - hazy, D1 90p, D2 100, D3 min irregs, LCX 71m, OM2 95, OM3 90 small, RCA 59m-->pending further CABG eval.  . Chronic systolic CHF (congestive heart failure)     a. EF 25-30% by 2D ECHO 04/20/14. Severely dec global hypokinesis. mildly elevated RVSP  . AAA (abdominal aortic aneurysm)   . Ischemic cardiomyopathy     a. EF 25-30% 03/2014.  . Mitral regurgitation     a. 03/2014 TEE: mild to mod MR.  . Myocardial infarction   . Neuromuscular disorder     DIABETIC NEUROPATHY  . Arthritis      Past Surgical History  Procedure Laterality Date  . Rotator cuff repair Right 1996,1997,2000  . Carpal tunnel release Right   . Tee without cardioversion N/A 04/24/2014    Procedure: TRANSESOPHAGEAL ECHOCARDIOGRAM (TEE);  Surgeon:  [name], MD;  Location[facility] ENDOSCOPY;  Service: Cardiovascular;  Laterality: N/A;  . Cardiac catheterization      [facility]  . Coronary artery bypass graft N/A 06/06/2014    Procedure: CORONARY ARTERY BYPASS GRAFTING (CABG) x 5 USING LEFT AND RIGHT SAPHENOUS LEG VEIN HARVESTED ENDOSCOPICALLY;  Surgeon: Kerin Perna, MD;  Location: [facility] OR;  Service: Open Heart Surgery;  Laterality: N/A;  . Intraoperative transesophageal echocardiogram N/A 06/06/2014    Procedure: INTRAOPERATIVE TRANSESOPHAGEAL ECHOCARDIOGRAM;  Surgeon: Kerin Perna, MD;  Location: [facility] OR;  Service: Open Heart Surgery;  Laterality: N/A;     Family History  Problem Relation Age of Onset  . Colon polyps Brother   . Lung cancer Brother   . Throat cancer Father   . Stroke Father      History    Social History  . Marital Status: Single    Spouse Name: N/A  . Number of Children: N/A  . Years of Education: N/A   Occupational History  . Not on file.   Social History Main Topics  . Smoking status: Former Smoker -- 1.00 packs/day for 25 years    Types: Cigarettes  . Smokeless tobacco: Never Used  . Alcohol Use: No  . Drug Use: No  . Sexual Activity: Not on file   Other Topics Concern  . Not on file   Social History Narrative      PHYSICAL EXAM   BP 96/50 mmHg  Pulse 74  Ht 6' (1.829 m)  Wt 296 lb (134.265 kg)  BMI 40.14 kg/m2 Constitutional: He is oriented to person, place, and time. He appears well-developed and well-nourished. No distress.  HENT: No nasal discharge.  Head: Normocephalic and atraumatic.  Eyes: Pupils are equal and round.  No discharge. Neck: Normal range of motion. Neck supple. No JVD present. No thyromegaly present.  Cardiovascular: Normal rate, regular rhythm, normal heart sounds. Exam reveals no gallop and no friction rub. No murmur heard. Well-healing surgical scar. Pulmonary/Chest: Effort normal and breath sounds normal. No stridor. No respiratory distress. He has no wheezes. He has no rales. He exhibits no tenderness.  Abdominal: Soft. Bowel sounds are normal. He exhibits no distension. There is no tenderness. There is no rebound and no guarding.  Musculoskeletal: Normal range of motion. He exhibits trace edema and no tenderness. Bruising at the vein harvest sites. Neurological: He is alert and oriented to person, place, and time. Coordination normal.  Skin: Skin is warm and dry. No rash noted. He is not diaphoretic. No erythema. No pallor.  Psychiatric: He has a normal mood and affect. His behavior is normal. Judgment and thought content normal.      EKG: Normal sinus rhythm with frequent PVCs in the form of bigeminy.   ASSESSMENT AND PLAN

## 2014-12-25 NOTE — Patient Instructions (Signed)
Medication Instructions:  Begin taking lasix 40mg  daily  Labwork: Labs today: magnesium, CBC, BMP, lipid profile, TSH  Testing/Procedures: Echocardiogram   Follow-Up: Two weeks after echocardiogram  Any Other Special Instructions Will Be Listed Below (If Applicable).

## 2014-12-26 LAB — CBC
HEMOGLOBIN: 13.3 g/dL (ref 12.6–17.7)
Hematocrit: 40.5 % (ref 37.5–51.0)
MCH: 28.7 pg (ref 26.6–33.0)
MCHC: 32.8 g/dL (ref 31.5–35.7)
MCV: 87 fL (ref 79–97)
Platelets: 184 10*3/uL (ref 150–379)
RBC: 4.64 x10E6/uL (ref 4.14–5.80)
RDW: 15.3 % (ref 12.3–15.4)
WBC: 9.1 10*3/uL (ref 3.4–10.8)

## 2014-12-26 LAB — TSH: TSH: 4.71 u[IU]/mL — ABNORMAL HIGH (ref 0.450–4.500)

## 2014-12-26 LAB — BASIC METABOLIC PANEL
BUN / CREAT RATIO: 19 (ref 10–22)
BUN: 26 mg/dL (ref 8–27)
CALCIUM: 9 mg/dL (ref 8.6–10.2)
CO2: 22 mmol/L (ref 18–29)
CREATININE: 1.38 mg/dL — AB (ref 0.76–1.27)
Chloride: 102 mmol/L (ref 97–108)
GFR calc Af Amer: 59 mL/min/{1.73_m2} — ABNORMAL LOW (ref >59)
GFR calc non Af Amer: 51 mL/min/{1.73_m2} — ABNORMAL LOW (ref >59)
Glucose: 85 mg/dL (ref 65–99)
Potassium: 5 mmol/L (ref 3.5–5.2)
Sodium: 142 mmol/L (ref 134–144)

## 2014-12-26 LAB — LIPID PANEL
CHOL/HDL RATIO: 3.8 ratio (ref 0.0–5.0)
Cholesterol, Total: 95 mg/dL — ABNORMAL LOW (ref 100–199)
HDL: 25 mg/dL — ABNORMAL LOW (ref >39)
LDL Calculated: 44 mg/dL (ref 0–99)
TRIGLYCERIDES: 129 mg/dL (ref 0–149)
VLDL Cholesterol Cal: 26 mg/dL (ref 5–40)

## 2014-12-26 LAB — MAGNESIUM: Magnesium: 2 mg/dL (ref 1.6–2.3)

## 2014-12-29 ENCOUNTER — Encounter: Payer: Self-pay | Admitting: Cardiovascular Disease

## 2014-12-29 DIAGNOSIS — I493 Ventricular premature depolarization: Secondary | ICD-10-CM | POA: Insufficient documentation

## 2014-12-29 NOTE — Assessment & Plan Note (Signed)
Continue treatment with atorvastatin with a target LDL of less than 70. 

## 2014-12-29 NOTE — Assessment & Plan Note (Signed)
The patient seems to have developed progressive fluid overload off diuretics. He gained 15 pounds gradually. I requested routine labs today. I also ordered an echocardiogram to evaluate LV systolic function given his decline. I resumed Lasix at 40 mg once daily.

## 2014-12-29 NOTE — Assessment & Plan Note (Signed)
He is having ventricular bigeminy. This might be contributing to heart failure. Check electrolytes, magnesium and TSH. He is currently on amiodarone for postoperative atrial fibrillation.

## 2014-12-29 NOTE — Assessment & Plan Note (Signed)
He denies any chest pain. He might require further ischemic workup based on his progress.

## 2015-01-04 ENCOUNTER — Telehealth: Payer: Self-pay | Admitting: Cardiovascular Disease

## 2015-01-04 NOTE — Telephone Encounter (Signed)
Patients wife called and said she was returning vm.  No record of call.  Patient is aware they have an appt. On 01-09-15.

## 2015-01-08 ENCOUNTER — Ambulatory Visit (INDEPENDENT_AMBULATORY_CARE_PROVIDER_SITE_OTHER): Payer: Medicare PPO

## 2015-01-08 ENCOUNTER — Ambulatory Visit (INDEPENDENT_AMBULATORY_CARE_PROVIDER_SITE_OTHER): Payer: Medicare PPO | Admitting: Podiatry

## 2015-01-08 VITALS — BP 120/68 | HR 70 | Resp 16 | Ht 72.0 in | Wt 296.0 lb

## 2015-01-08 DIAGNOSIS — L97511 Non-pressure chronic ulcer of other part of right foot limited to breakdown of skin: Secondary | ICD-10-CM

## 2015-01-08 DIAGNOSIS — L89891 Pressure ulcer of other site, stage 1: Secondary | ICD-10-CM

## 2015-01-08 MED ORDER — MUPIROCIN 2 % EX OINT: TOPICAL_OINTMENT | CUTANEOUS | Status: DC

## 2015-01-08 NOTE — Progress Notes (Signed)
He presents today with a chief complaint of an ulceration plantar aspect of the right hallux. He states that I think he started out as a blister after I was told by the cardiologist and my heart surgeon that I needed to get exercise. He states that he had noticed a blister and then recently blood followed. He denies fever chills nausea vomiting muscle aches and pains. Relates a history of diabetes and cardiovascular disease.  Objective: Vital signs are stable he is alert and oriented 3 I have reviewed his past medical history medications allergies surgery social history and review of systems. Pulses are palpable bilateral. Neurologic sensorium is decreased persons Weinstein monofilament. Deep tendon reflexes aren't elicitable bilateral. Muscle strength is normal bilateral. Cutaneous evaluation demonstrates supple hydrated cutis exception of a 4 x 4 millimeter ulcerative lesion sub-hallux interphalangeal joint right foot. This is very superficial does not probe deep and appears to only have lost epidermis. I debrided reactive hyperkeratosis surrounding the lesion today to bleeding. I see no signs of infection. Radiograph does not instructed any type of osseus abnormality in this area.  Assessment diabetic ulceration right hallux.  Plan: Placed padding today and discussed appropriate shoe gear. Started him on Bactroban ointment and soaks I will follow-up with him in 2 weeks.

## 2015-01-09 ENCOUNTER — Ambulatory Visit: Payer: Medicare PPO

## 2015-01-09 ENCOUNTER — Other Ambulatory Visit: Payer: Self-pay

## 2015-01-09 DIAGNOSIS — I4891 Unspecified atrial fibrillation: Secondary | ICD-10-CM

## 2015-01-12 ENCOUNTER — Telehealth: Payer: Self-pay | Admitting: *Deleted

## 2015-01-12 NOTE — Telephone Encounter (Signed)
pt care taker asking if we can give her the results on test. She has some questions and patient is with her.  Please call patient.

## 2015-01-15 NOTE — Telephone Encounter (Signed)
Left message on machine for patient to contact the office.   

## 2015-01-16 ENCOUNTER — Encounter: Payer: Self-pay | Admitting: Cardiovascular Disease

## 2015-01-16 ENCOUNTER — Other Ambulatory Visit: Payer: Self-pay

## 2015-01-16 ENCOUNTER — Ambulatory Visit (INDEPENDENT_AMBULATORY_CARE_PROVIDER_SITE_OTHER): Payer: Medicare PPO | Admitting: Cardiovascular Disease

## 2015-01-16 VITALS — BP 108/60 | HR 77 | Ht 72.0 in | Wt 289.5 lb

## 2015-01-16 DIAGNOSIS — I739 Peripheral vascular disease, unspecified: Secondary | ICD-10-CM

## 2015-01-16 DIAGNOSIS — I5022 Chronic systolic (congestive) heart failure: Secondary | ICD-10-CM | POA: Diagnosis not present

## 2015-01-16 DIAGNOSIS — I2510 Atherosclerotic heart disease of native coronary artery without angina pectoris: Secondary | ICD-10-CM | POA: Diagnosis not present

## 2015-01-16 DIAGNOSIS — L97519 Non-pressure chronic ulcer of other part of right foot with unspecified severity: Secondary | ICD-10-CM | POA: Diagnosis not present

## 2015-01-16 DIAGNOSIS — E785 Hyperlipidemia, unspecified: Secondary | ICD-10-CM

## 2015-01-16 MED ORDER — CARVEDILOL 3.125 MG PO TABS: 3.1250 mg | ORAL_TABLET | Freq: Two times a day (BID) | ORAL | Status: DC

## 2015-01-16 NOTE — Assessment & Plan Note (Signed)
The patient has a slow healing ulcer with no palpable distal pulses. He has no history of diabetes and thus at risk for peripheral arterial disease. He also has atypical leg pain laterally with walking. I requested lower extremity arterial Doppler.

## 2015-01-16 NOTE — Telephone Encounter (Signed)
Pt had office visit with Dr. Kirke Corin today who further explained test results.

## 2015-01-16 NOTE — Progress Notes (Signed)
HPI  This is a 74 y/o male who is here today for follow-up visit regarding CAD s/p CABG and chronic systolic heart failure. He was  admitted to [facility] in August with chest pain and ruled in for NSTEMI. Echo showed severe LV dysfxn and cath revealed severe 3VD. He underwent CABG on October 13 by Dr. Donata Clay with LIMA to diagonal, SVG to LAD, SVG to OM1, SVG to OM 2 and SVG to right PDA. Intraoperative TEE showed only mild MR with improvement of ejection fraction to 40-45%.  Postoperative course was remarkable for atrial fibrillation which was treated with amiodarone.  He was taking Lasix only as needed and not required for few months. He was seen recently for progressive shortness of breath and lower extremity edema with 15 pounds weight gain. I resumed Lasix 40 mg once daily. He was noted to have frequent PVCs. Routine labs showed slightly elevated TSH and mild chronic kidney disease with a creatinine of 1.38. Electrolytes were overall unremarkable. Repeat echocardiogram showed an ejection fraction of 40-45% with no significant valvular abnormalities. He reports improved symptoms. He lost 7 pounds since his last visit. Exertional dyspnea improved significantly. He developed an ulceration on the bottom of the right big toe which has persisted over the last few weeks. He complains of bilateral leg pain with walking which is equal in both sides.   No Known Allergies   Current Outpatient Prescriptions on File Prior to Visit  Medication Sig Dispense Refill  . aspirin EC 325 MG EC tablet Take 1 tablet (325 mg total) by mouth daily. 30 tablet 0  . atorvastatin (LIPITOR) 40 MG tablet Take 1 tablet (40 mg total) by mouth daily. 90 tablet 6  . colchicine 0.6 MG tablet Take 0.6 mg by mouth daily as needed (for gout flareup).     . cyanocobalamin 500 MCG tablet Take 500 mcg by mouth daily.    . furosemide (LASIX) 40 MG tablet Take 1 tablet (40 mg total) by mouth daily. 30 tablet 6  . gabapentin  (NEURONTIN) 300 MG capsule Take 300 mg by mouth 4 (four) times daily.     [name] lisinopril (PRINIVIL,ZESTRIL) 20 MG tablet Take 1 tablet (20 mg total) by mouth every morning. 90 tablet 3  . metFORMIN (GLUCOPHAGE) 1000 MG tablet Take 1 tablet (1,000 mg total) by mouth 2 (two) times daily with a meal. 60 tablet 11  . Misc Natural Products (BLACK CHERRY CONCENTRATE PO) Take 1 capsule by mouth daily.    . mupirocin ointment (BACTROBAN) 2 % Apply to wound twice a day. 30 g 2   No current facility-administered medications on file prior to visit.     Past Medical History  Diagnosis Date  . Hypertension   . Hyperlipidemia   . Gout   . Diabetes mellitus without complication   . Nephrolithiasis   . Umbilical hernia   . Epigastric hernia   . Obesity   . CAD (coronary artery disease)     a. 03/2014 NSTEMI/Cath: LM nl, LAD 50m - hazy, D1 90p, D2 100, D3 min irregs, LCX 20m, OM2 95, OM3 90 small, RCA 45m-->pending further CABG eval.  . Chronic systolic CHF (congestive heart failure)     a. EF 25-30% by 2D ECHO 04/20/14. Severely dec global hypokinesis. mildly elevated RVSP  . AAA (abdominal aortic aneurysm)   . Ischemic cardiomyopathy     a. EF 25-30% 03/2014.  . Mitral regurgitation     a. 03/2014 TEE: mild to mod  MR.  . Myocardial infarction   . Neuromuscular disorder     DIABETIC NEUROPATHY  . Arthritis      Past Surgical History  Procedure Laterality Date  . Rotator cuff repair Right 1996,1997,2000  . Carpal tunnel release Right   . Tee without cardioversion N/A 04/24/2014    Procedure: TRANSESOPHAGEAL ECHOCARDIOGRAM (TEE);  Surgeon: [name], MD;  Location: [facility] ENDOSCOPY;  Service: Cardiovascular;  Laterality: N/A;  . Cardiac catheterization      [facility]  . Coronary artery bypass graft N/A 06/06/2014    Procedure: CORONARY ARTERY BYPASS GRAFTING (CABG) x 5 USING LEFT AND RIGHT SAPHENOUS LEG VEIN HARVESTED ENDOSCOPICALLY;  Surgeon: Kerin Perna, MD;  Location: [facility] OR;  Service: Open  Heart Surgery;  Laterality: N/A;  . Intraoperative transesophageal echocardiogram N/A 06/06/2014    Procedure: INTRAOPERATIVE TRANSESOPHAGEAL ECHOCARDIOGRAM;  Surgeon: Kerin Perna, MD;  Location: [facility] OR;  Service: Open Heart Surgery;  Laterality: N/A;     Family History  Problem Relation Age of Onset  . Colon polyps Brother   . Lung cancer Brother   . Throat cancer Father   . Stroke Father      History   Social History  . Marital Status: Single    Spouse Name: N/A  . Number of Children: N/A  . Years of Education: N/A   Occupational History  . Not on file.   Social History Main Topics  . Smoking status: Former Smoker -- 1.00 packs/day for 25 years    Types: Cigarettes  . Smokeless tobacco: Never Used  . Alcohol Use: No  . Drug Use: No  . Sexual Activity: Not on file   Other Topics Concern  . Not on file   Social History Narrative      PHYSICAL EXAM   BP 108/60 mmHg  Pulse 77  Ht 6' (1.829 m)  Wt 289 lb 8 oz (131.316 kg)  BMI 39.25 kg/m2 Constitutional: He is oriented to person, place, and time. He appears well-developed and well-nourished. No distress.  HENT: No nasal discharge.  Head: Normocephalic and atraumatic.  Eyes: Pupils are equal and round.  No discharge. Neck: Normal range of motion. Neck supple. No JVD present. No thyromegaly present.  Cardiovascular: Normal rate, regular rhythm, normal heart sounds. Exam reveals no gallop and no friction rub. No murmur heard. Well-healing surgical scar. Pulmonary/Chest: Effort normal and breath sounds normal. No stridor. No respiratory distress. He has no wheezes. He has no rales. He exhibits no tenderness.  Abdominal: Soft. Bowel sounds are normal. He exhibits no distension. There is no tenderness. There is no rebound and no guarding.  Musculoskeletal: Normal range of motion. He exhibits trace edema and no tenderness. Bruising at the vein harvest sites. Neurological: He is alert and oriented to person, place,  and time. Coordination normal.  Skin: Skin is warm and dry. No rash noted. He is not diaphoretic. No erythema. No pallor.  Psychiatric: He has a normal mood and affect. His behavior is normal. Judgment and thought content normal.  Vascular: Distal pulses are not palpable. There is a small 5 mm ulceration on the bottom of the right big toe      ASSESSMENT AND PLAN

## 2015-01-16 NOTE — Assessment & Plan Note (Signed)
He has no clear symptoms of angina. Continue medical therapy. 

## 2015-01-16 NOTE — Assessment & Plan Note (Signed)
Lab Results  Component Value Date   CHOL 95* 12/25/2014   HDL 25* 12/25/2014   LDLCALC 44 12/25/2014   TRIG 129 12/25/2014   CHOLHDL 3.8 12/25/2014   Continue current dose of atorvastatin.

## 2015-01-16 NOTE — Patient Instructions (Addendum)
Medication Instructions:  Your physician recommends that you continue on your current medications as directed. Please refer to the Current Medication list given to you today.   Labwork: None  Testing/Procedures: Your physician has requested that you have a lower extremity arterial duplex. This test is an ultrasound of the arteries in the legs or arms. It looks at arterial blood flow in the legs and arms. Allow one hour for Lower and Upper Arterial scans. There are no restrictions or special instructions   Follow-Up: Your physician recommends that you schedule a follow-up appointment in: THREE MONTHS with Dr. Kirke Corin.   Any Other Special Instructions Will Be Listed Below (If Applicable).

## 2015-01-16 NOTE — Assessment & Plan Note (Signed)
This is due to ischemic cardiomyopathy with previous ejection fraction of 30%. Most recent ejection fraction was 40-45%. Volume overload improved with resuming daily Lasix. I instructed him to continue with low sodium diet and to weigh himself daily. Continue small dose carvedilol and lisinopril. His blood pressure has been running low and thus I could not increase the dosages.

## 2015-01-16 NOTE — Telephone Encounter (Signed)
Refill sent for carvedilol 3.125 mg take one tablet twice a day.

## 2015-01-29 ENCOUNTER — Ambulatory Visit: Payer: Medicare PPO | Admitting: Podiatry

## 2015-01-30 ENCOUNTER — Other Ambulatory Visit: Payer: Self-pay | Admitting: Cardiovascular Disease

## 2015-01-30 ENCOUNTER — Encounter (INDEPENDENT_AMBULATORY_CARE_PROVIDER_SITE_OTHER): Payer: Medicare PPO

## 2015-01-30 ENCOUNTER — Ambulatory Visit (INDEPENDENT_AMBULATORY_CARE_PROVIDER_SITE_OTHER): Payer: Medicare PPO

## 2015-01-30 DIAGNOSIS — I739 Peripheral vascular disease, unspecified: Secondary | ICD-10-CM

## 2015-03-19 ENCOUNTER — Other Ambulatory Visit: Payer: Self-pay | Admitting: Family Medicine

## 2015-03-19 DIAGNOSIS — I714 Abdominal aortic aneurysm, without rupture, unspecified: Secondary | ICD-10-CM

## 2015-03-22 ENCOUNTER — Ambulatory Visit
Admission: RE | Admit: 2015-03-22 | Discharge: 2015-03-22 | Disposition: A | Discharge status: Home / Self Care | Payer: Medicare PPO | Source: Ambulatory Visit | Attending: Family Medicine | Admitting: Family Medicine

## 2015-03-22 DIAGNOSIS — I714 Abdominal aortic aneurysm, without rupture, unspecified: Secondary | ICD-10-CM

## 2015-04-19 ENCOUNTER — Encounter: Payer: Self-pay | Admitting: Cardiovascular Disease

## 2015-04-19 ENCOUNTER — Ambulatory Visit (INDEPENDENT_AMBULATORY_CARE_PROVIDER_SITE_OTHER): Payer: Medicare PPO | Admitting: Cardiovascular Disease

## 2015-04-19 VITALS — BP 129/76 | HR 80 | Ht 72.0 in | Wt 294.2 lb

## 2015-04-19 DIAGNOSIS — I2510 Atherosclerotic heart disease of native coronary artery without angina pectoris: Secondary | ICD-10-CM

## 2015-04-19 DIAGNOSIS — E785 Hyperlipidemia, unspecified: Secondary | ICD-10-CM

## 2015-04-19 DIAGNOSIS — I714 Abdominal aortic aneurysm, without rupture, unspecified: Secondary | ICD-10-CM

## 2015-04-19 DIAGNOSIS — I5022 Chronic systolic (congestive) heart failure: Secondary | ICD-10-CM | POA: Diagnosis not present

## 2015-04-19 DIAGNOSIS — I10 Essential (primary) hypertension: Secondary | ICD-10-CM

## 2015-04-19 NOTE — Assessment & Plan Note (Signed)
This is small in size and monitored by his primary care physician.

## 2015-04-19 NOTE — Progress Notes (Signed)
HPI  This is a 74 y/o male who is here today for follow-up visit regarding CAD s/p CABG and chronic systolic heart failure. He was  admitted to [facility] in August, 2015 with chest pain and ruled in for NSTEMI. Echo showed severe LV dysfxn and cath revealed severe 3VD. He underwent CABG on October 13 by Dr. Donata Clay with LIMA to diagonal, SVG to LAD, SVG to OM1, SVG to OM 2 and SVG to right PDA. Intraoperative TEE showed only mild MR with improvement of ejection fraction to 40-45%.  Postoperative course was remarkable for atrial fibrillation which was treated with amiodarone.   Repeat echocardiogram in May 2016 showed an ejection fraction of 40-45% with no significant valvular abnormalities. Fluid overload improved with Lasix. He is currently using this only as needed. He has been doing reasonably well and denies any chest pain or shortness of breath. He continues to struggle with obesity. His diet is very high in carbohydrates.   No Known Allergies   Current Outpatient Prescriptions on File Prior to Visit  Medication Sig Dispense Refill  . aspirin EC 325 MG EC tablet Take 1 tablet (325 mg total) by mouth daily. 30 tablet 0  . atorvastatin (LIPITOR) 40 MG tablet Take 1 tablet (40 mg total) by mouth daily. 90 tablet 6  . carvedilol (COREG) 3.125 MG tablet Take 1 tablet (3.125 mg total) by mouth 2 (two) times daily. 60 tablet 6  . colchicine 0.6 MG tablet Take 0.6 mg by mouth daily as needed (for gout flareup).     . cyanocobalamin 500 MCG tablet Take 500 mcg by mouth daily.    . furosemide (LASIX) 40 MG tablet Take 1 tablet (40 mg total) by mouth daily. (Patient taking differently: Take 40 mg by mouth daily as needed. ) 30 tablet 6  . gabapentin (NEURONTIN) 300 MG capsule Take 300 mg by mouth 4 (four) times daily.     [name] lisinopril (PRINIVIL,ZESTRIL) 20 MG tablet Take 1 tablet (20 mg total) by mouth every morning. 90 tablet 3  . metFORMIN (GLUCOPHAGE) 1000 MG tablet Take 1 tablet (1,000 mg  total) by mouth 2 (two) times daily with a meal. 60 tablet 11  . Misc Natural Products (BLACK CHERRY CONCENTRATE PO) Take 1 capsule by mouth daily.     No current facility-administered medications on file prior to visit.     Past Medical History  Diagnosis Date  . Hypertension   . Hyperlipidemia   . Gout   . Diabetes mellitus without complication   . Nephrolithiasis   . Umbilical hernia   . Epigastric hernia   . Obesity   . CAD (coronary artery disease)     a. 03/2014 NSTEMI/Cath: LM nl, LAD 62m - hazy, D1 90p, D2 100, D3 min irregs, LCX 10m, OM2 95, OM3 90 small, RCA 31m-->pending further CABG eval.  . Chronic systolic CHF (congestive heart failure)     a. EF 25-30% by 2D ECHO 04/20/14. Severely dec global hypokinesis. mildly elevated RVSP  . AAA (abdominal aortic aneurysm)   . Ischemic cardiomyopathy     a. EF 25-30% 03/2014.  . Mitral regurgitation     a. 03/2014 TEE: mild to mod MR.  . Myocardial infarction   . Neuromuscular disorder     DIABETIC NEUROPATHY  . Arthritis      Past Surgical History  Procedure Laterality Date  . Rotator cuff repair Right 1996,1997,2000  . Carpal tunnel release Right   . Tee without cardioversion N/A  04/24/2014    Procedure: TRANSESOPHAGEAL ECHOCARDIOGRAM (TEE);  Surgeon: [name], MD;  Location: [facility] ENDOSCOPY;  Service: Cardiovascular;  Laterality: N/A;  . Cardiac catheterization      [facility]  . Coronary artery bypass graft N/A 06/06/2014    Procedure: CORONARY ARTERY BYPASS GRAFTING (CABG) x 5 USING LEFT AND RIGHT SAPHENOUS LEG VEIN HARVESTED ENDOSCOPICALLY;  Surgeon: Kerin Perna, MD;  Location: [facility] OR;  Service: Open Heart Surgery;  Laterality: N/A;  . Intraoperative transesophageal echocardiogram N/A 06/06/2014    Procedure: INTRAOPERATIVE TRANSESOPHAGEAL ECHOCARDIOGRAM;  Surgeon: Kerin Perna, MD;  Location: [facility] OR;  Service: Open Heart Surgery;  Laterality: N/A;     Family History  Problem Relation Age of Onset  . Colon  polyps Brother   . Lung cancer Brother   . Throat cancer Father   . Stroke Father      Social History   Social History  . Marital Status: Single    Spouse Name: N/A  . Number of Children: N/A  . Years of Education: N/A   Occupational History  . Not on file.   Social History Main Topics  . Smoking status: Former Smoker -- 1.00 packs/day for 25 years    Types: Cigarettes  . Smokeless tobacco: Never Used  . Alcohol Use: No  . Drug Use: No  . Sexual Activity: Not on file   Other Topics Concern  . Not on file   Social History Narrative      PHYSICAL EXAM   BP 129/76 mmHg  Pulse 80  Ht 6' (1.829 m)  Wt 294 lb 4 oz (133.471 kg)  BMI 39.90 kg/m2 Constitutional: He is oriented to person, place, and time. He appears well-developed and well-nourished. No distress.  HENT: No nasal discharge.  Head: Normocephalic and atraumatic.  Eyes: Pupils are equal and round.  No discharge. Neck: Normal range of motion. Neck supple. No JVD present. No thyromegaly present.  Cardiovascular: Normal rate, regular rhythm, normal heart sounds. Exam reveals no gallop and no friction rub. No murmur heard. Well-healing surgical scar. Pulmonary/Chest: Effort normal and breath sounds normal. No stridor. No respiratory distress. He has no wheezes. He has no rales. He exhibits no tenderness.  Abdominal: Soft. Bowel sounds are normal. He exhibits no distension. There is no tenderness. There is no rebound and no guarding.  Musculoskeletal: Normal range of motion. He exhibits trace edema and no tenderness. Bruising at the vein harvest sites. Neurological: He is alert and oriented to person, place, and time. Coordination normal.  Skin: Skin is warm and dry. No rash noted. He is not diaphoretic. No erythema. No pallor.  Psychiatric: He has a normal mood and affect. His behavior is normal. Judgment and thought content normal.  Vascular: Distal pulses are not palpable. There is a small 5 mm ulceration on the  bottom of the right big toe      ASSESSMENT AND PLAN

## 2015-04-19 NOTE — Assessment & Plan Note (Signed)
Blood pressure is controlled on current medications. 

## 2015-04-19 NOTE — Patient Instructions (Addendum)
Medication Instructions: Continue same medications.   Labwork: None.   Procedures/Testing: None.   Follow-Up: 3 months with Dr. Arida.   Any Additional Special Instructions Will Be Listed Below (If Applicable).   

## 2015-04-19 NOTE — Assessment & Plan Note (Signed)
This is due to ischemic cardiomyopathy with previous ejection fraction of 30%. Most recent ejection fraction was 40-45%. Volume overload improved with Lasix. He is currently using this as needed. Continue small dose carvedilol and lisinopril.

## 2015-04-19 NOTE — Assessment & Plan Note (Signed)
Lab Results  Component Value Date   CHOL 95* 12/25/2014   HDL 25* 12/25/2014   LDLCALC 44 12/25/2014   TRIG 129 12/25/2014   CHOLHDL 3.8 12/25/2014   Continue treatment with atorvastatin. I had a prolonged discussion with him about the importance of healthy diet and decreasing carbohydrates.

## 2015-04-19 NOTE — Assessment & Plan Note (Signed)
He is doing well overall with no symptoms suggestive of angina. Continue medical therapy. 

## 2015-05-08 ENCOUNTER — Encounter: Payer: Self-pay | Admitting: Nurse Practitioner

## 2015-05-08 ENCOUNTER — Ambulatory Visit (INDEPENDENT_AMBULATORY_CARE_PROVIDER_SITE_OTHER): Payer: Medicare PPO | Admitting: Nurse Practitioner

## 2015-05-08 ENCOUNTER — Ambulatory Visit: Payer: Medicare PPO

## 2015-05-08 VITALS — BP 142/70 | HR 90 | Resp 20 | Wt 292.0 lb

## 2015-05-08 DIAGNOSIS — I5023 Acute on chronic systolic (congestive) heart failure: Secondary | ICD-10-CM

## 2015-05-08 DIAGNOSIS — I2510 Atherosclerotic heart disease of native coronary artery without angina pectoris: Secondary | ICD-10-CM | POA: Diagnosis not present

## 2015-05-08 DIAGNOSIS — I5022 Chronic systolic (congestive) heart failure: Secondary | ICD-10-CM

## 2015-05-08 DIAGNOSIS — I10 Essential (primary) hypertension: Secondary | ICD-10-CM | POA: Diagnosis not present

## 2015-05-08 DIAGNOSIS — R0602 Shortness of breath: Secondary | ICD-10-CM

## 2015-05-08 DIAGNOSIS — E785 Hyperlipidemia, unspecified: Secondary | ICD-10-CM | POA: Diagnosis not present

## 2015-05-08 NOTE — Patient Instructions (Addendum)
Medication Instructions:  Your physician has recommended you make the following change in your medication:  INCREASE lasix to 40mg  twice per day FOR THREE DAYS then resume normal schedule of 40mg  once per day    Labwork:  Your physician recommends that you have labs today: BMET, BNP, CBC   Testing/Procedures: none  Follow-Up: Your physician recommends that you schedule a follow-up appointment in: one week with Eula Listen, PA-C or Dr. Kirke Corin.    Any Other Special Instructions Will Be Listed Below (If Applicable).

## 2015-05-08 NOTE — Patient Instructions (Signed)
1.) Reason for visit: walk in, pt SOB, heart skipping a beat  2.) Name of MD requesting visit:   3.) H&P: NSTEM, HTN, CHF  4.) ROS related to problem: Pt indicates he is SOB and when girlfriend checked HR this AM, it was skipping a beat. States he drank coffee this morning and has been drinking Coke after dinner.   5.) Assessment and plan per MD: Vitals taken, EKG performed and given to Dr. Mariah Milling for review.   Added to Ward Givens, NP schedule

## 2015-05-08 NOTE — Progress Notes (Signed)
Patient Name: [name] [name] Date of Encounter: 05/08/2015  Primary Care Provider:  [name], MD Primary Cardiologist:  Judie Petit. Kirke Corin, MD   Chief Complaint  74 y/o male with a h/o CAD s/p CABG and chronic systolic CHF, who presented to clinic today with a 2-3 wk h/o progressive DOE and wt gain.  Past Medical History   Past Medical History  Diagnosis Date  . Hypertension   . Hyperlipidemia   . Gout   . Diabetes mellitus without complication   . Nephrolithiasis   . Umbilical hernia   . Epigastric hernia   . Obesity   . Chronic systolic CHF (congestive heart failure)     a. EF 25-30% by 2D ECHO 04/20/14. Severely dec global hypokinesis. mildly elevated RVSP;  b. 12/2014 Echo: EF 40-45%, mod dil LA, mildly dil RV/RA.  [name] AAA (abdominal aortic aneurysm)   . Ischemic cardiomyopathy     a. EF 25-30% 03/2014;  b. EF 40-45% 12/2014.  . Mitral regurgitation     a. 03/2014 TEE: mild to mod MR.  . Myocardial infarction   . Neuromuscular disorder     DIABETIC NEUROPATHY  . Arthritis   . CAD (coronary artery disease)     a. 03/2014 NSTEMI/Cath: LM nl, LAD 81m - hazy, D1 90p, D2 100, D3 min irregs, LCX 46m, OM2 95, OM3 90 small, RCA 44m;  b. 05/2014 CABG x 5 (LIMA->D1, VG->LAD, VG->OM1, VG->OM2, VG->RPDA).   Past Surgical History  Procedure Laterality Date  . Rotator cuff repair Right 1996,1997,2000  . Carpal tunnel release Right   . Tee without cardioversion N/A 04/24/2014    Procedure: TRANSESOPHAGEAL ECHOCARDIOGRAM (TEE);  Surgeon: [name], MD;  Location: [facility] ENDOSCOPY;  Service: Cardiovascular;  Laterality: N/A;  . Cardiac catheterization      [facility]  . Coronary artery bypass graft N/A 06/06/2014    Procedure: CORONARY ARTERY BYPASS GRAFTING (CABG) x 5 USING LEFT AND RIGHT SAPHENOUS LEG VEIN HARVESTED ENDOSCOPICALLY;  Surgeon: Kerin Perna, MD;  Location: [facility] OR;  Service: Open Heart Surgery;  Laterality: N/A;  . Intraoperative transesophageal echocardiogram N/A 06/06/2014      Procedure: INTRAOPERATIVE TRANSESOPHAGEAL ECHOCARDIOGRAM;  Surgeon: Kerin Perna, MD;  Location: [facility] OR;  Service: Open Heart Surgery;  Laterality: N/A;    Allergies  No Known Allergies  HPI  74 y/o male with the above complex problem list.  In 03/2014 he suffered a NSTEMI and presented with severe dyspnea and volume overload.  He did not have chest pain/pressure upon presentation.  He was aggressively diuresed and subsequently underwent cath revealing severe multivessel dzs.  Following clinical improvement, he f/u with thoracic surgery as an outpt and later underwent CABG x 5 in 05/2014.  EF pre-CABG was 25-30%.  F/U echo in 12/2014 showed EF of 40-45%.  He was last seen by Dr. Kirke Corin in clinic on 8/25 @ which time he was feelling well.  He denied c/p or dyspnea.  Over the past 2-3 wks however, he has been noting progressive DOE w/o chest pain, pnd, orthopnea, n, v, dizziness, syncope, or early satiety.  He has noted weight gain and also mild lower ext edema.  He has been avoiding salt @ home but does eat out about once/wk and has bacon about once/wk.  He's not clear as to the amount of processed foods and salty snacks that he might be eating @ home.  He walked into clinic today secondary to progressive dyspnea.    Home Medications  Prior to Admission medications   Medication Sig Start Date End Date Taking? Authorizing Provider  aspirin EC 325 MG EC tablet Take 1 tablet (325 mg total) by mouth daily. 06/14/14   Donielle Margaretann Loveless, PA-C  atorvastatin (LIPITOR) 40 MG tablet Take 1 tablet (40 mg total) by mouth daily. 06/26/14   Iran Ouch, MD  carvedilol (COREG) 3.125 MG tablet Take 1 tablet (3.125 mg total) by mouth 2 (two) times daily. 01/16/15   Iran Ouch, MD  colchicine 0.6 MG tablet Take 0.6 mg by mouth daily as needed (for gout flareup).     Historical Provider, MD  cyanocobalamin 500 MCG tablet Take 500 mcg by mouth daily.    Historical Provider, MD  furosemide (LASIX) 40 MG  tablet Take 1 tablet (40 mg total) by mouth daily. Patient taking differently: Take 40 mg by mouth daily as needed.  12/25/14   Iran Ouch, MD  gabapentin (NEURONTIN) 300 MG capsule Take 300 mg by mouth 4 (four) times daily.  01/02/14   Historical Provider, MD  lisinopril (PRINIVIL,ZESTRIL) 20 MG tablet Take 1 tablet (20 mg total) by mouth every morning. 07/05/14   Iran Ouch, MD  metFORMIN (GLUCOPHAGE) 1000 MG tablet Take 1 tablet (1,000 mg total) by mouth 2 (two) times daily with a meal. 04/27/14   Janetta Hora, PA-C  Misc Natural Products (BLACK CHERRY CONCENTRATE PO) Take 1 capsule by mouth daily.    Historical Provider, MD    Review of Systems  As above, pt has been having progressive DOE over the past 2-3 wks without chest pain.  He has noted lower ext edema and weight gain.  All other systems reviewed and are otherwise negative except as noted above.  Physical Exam  VS:  AF, 90, 20, 142/70 , 292 lbs. GEN: Obese, no acute distress. HEENT: normal. Neck: Supple, obese, difficult to assess JVP 2/2 girth.  No carotid bruits, or masses. Cardiac: RRR, no murmurs, rubs, or gallops. No clubbing, cyanosis, 1+ R>L bilat LE edema to knees.  Radials/DP/PT 2+ and equal bilaterally.  Respiratory:  Respirations regular and unlabored, clear to auscultation bilaterally. GI: Semi-firm, nontender, BS + x 4. MS: no deformity or atrophy. Skin: warm and dry, no rash. Neuro:  Strength and sensation are intact. Psych: Normal affect.  Accessory Clinical Findings  ECG - RSR, 90, ST dep/twi aVL - old.  No acute st/t changes.  Assessment & Plan  1.  Acute on chronic systolic CHF/ICM:  Pt presents with a 2-3 wk h/o progressive DOE, wt gain, and lower ext edema.  EF 40-45% by echo in May 2016.  He has not been having c/p. He says that his wt @ home is typically 286 when he feels good, and it has been running in the 290's recently.  Interestingly, according to our scale, he is down about 2 lbs  from his last clinic visit, but he says that that probably isn't accurate.  He does have mild LEE and some increase in abd girth on exam.  He has been taking lasix 40 mg daily over the past 3 days with good response but w/o significant change in Ss.  I've recommended that he take lasix 40 mg BID over the next three days and then drop back down to 40 mg daily.  Prior to three days ago, he was only taking lasix prn.  I will check a bmet, cbc, and BNP today and arrange for early f/u next week.  If despite  adequate diuresis, he continues to experience DOE, we will need to strongly consider repeat diagnostic catheterization as dyspnea was his anginal equivalent @ the time of his NSTEMI.  Cont bb/acei.  2.  CAD:  S/p CABG x 5 in 05/2014.  He has not been having c/p but has had DOE in the setting of what appears to be mild volume overload.  As above, if he continues to have Ss despite adequate diuresis, we will need to consider relook cath as dyspnea was his anginal equivalent in the past.  Cont asa, statin, bb, acei.  3.  Essential HTN:  BP elevated today.  Reassess following diuresis next week.  4.  HL:  Cont statin.  5.  Morbid Obesity:  Would benefit from focused outpt nutritional counseling.  6.  DM II:  On metformin per PCP.  7.  Dispo:  F/u labs today.  F/u in 1 wk.    Nicolasa Ducking, NP 05/08/2015, 12:49 PM

## 2015-05-09 ENCOUNTER — Ambulatory Visit
Admission: RE | Admit: 2015-05-09 | Discharge: 2015-05-09 | Disposition: A | Discharge status: Home / Self Care | Payer: Medicare PPO | Source: Ambulatory Visit | Attending: Nurse Practitioner | Admitting: Nurse Practitioner

## 2015-05-09 ENCOUNTER — Telehealth: Payer: Self-pay

## 2015-05-09 ENCOUNTER — Other Ambulatory Visit: Payer: Medicare PPO

## 2015-05-09 ENCOUNTER — Ambulatory Visit
Admission: RE | Admit: 2015-05-09 | Discharge: 2015-05-09 | Disposition: A | Discharge status: Home / Self Care | Payer: Medicare PPO | Source: Ambulatory Visit | Attending: Cardiovascular Disease | Admitting: Cardiovascular Disease

## 2015-05-09 ENCOUNTER — Other Ambulatory Visit
Admission: RE | Admit: 2015-05-09 | Discharge: 2015-05-09 | Disposition: A | Discharge status: Home / Self Care | Payer: Medicare PPO | Source: Ambulatory Visit | Attending: Cardiovascular Disease | Admitting: Cardiovascular Disease

## 2015-05-09 ENCOUNTER — Other Ambulatory Visit: Payer: Self-pay

## 2015-05-09 DIAGNOSIS — R0602 Shortness of breath: Secondary | ICD-10-CM | POA: Insufficient documentation

## 2015-05-09 DIAGNOSIS — Z951 Presence of aortocoronary bypass graft: Secondary | ICD-10-CM | POA: Insufficient documentation

## 2015-05-09 LAB — BASIC METABOLIC PANEL WITH GFR
Anion gap: 9 (ref 5–15)
BUN/Creatinine Ratio: 25 — ABNORMAL HIGH (ref 10–22)
BUN: 40 mg/dL — ABNORMAL HIGH (ref 8–27)
BUN: 48 mg/dL — ABNORMAL HIGH (ref 6–20)
CO2: 24 mmol/L (ref 18–29)
CO2: 23 mmol/L (ref 22–32)
Calcium: 10 mg/dL (ref 8.6–10.2)
Calcium: 9.2 mg/dL (ref 8.9–10.3)
Chloride: 102 mmol/L (ref 97–108)
Chloride: 106 mmol/L (ref 101–111)
Creatinine, Ser: 1.58 mg/dL — ABNORMAL HIGH (ref 0.76–1.27)
Creatinine, Ser: 1.83 mg/dL — ABNORMAL HIGH (ref 0.61–1.24)
GFR calc Af Amer: 50 mL/min/{1.73_m2} — ABNORMAL LOW
GFR calc Af Amer: 41 mL/min — ABNORMAL LOW
GFR calc non Af Amer: 43 mL/min/{1.73_m2} — ABNORMAL LOW
GFR calc non Af Amer: 35 mL/min — ABNORMAL LOW
Glucose, Bld: 102 mg/dL — ABNORMAL HIGH (ref 65–99)
Glucose: 109 mg/dL — ABNORMAL HIGH (ref 65–99)
Potassium: 5.6 mmol/L — ABNORMAL HIGH (ref 3.5–5.2)
Potassium: 4.9 mmol/L (ref 3.5–5.1)
Sodium: 143 mmol/L (ref 134–144)
Sodium: 138 mmol/L (ref 135–145)

## 2015-05-09 LAB — CBC
HEMOGLOBIN: 14.4 g/dL (ref 12.6–17.7)
Hematocrit: 44.5 % (ref 37.5–51.0)
MCH: 29.3 pg (ref 26.6–33.0)
MCHC: 32.4 g/dL (ref 31.5–35.7)
MCV: 90 fL (ref 79–97)
PLATELETS: 207 10*3/uL (ref 150–379)
RBC: 4.92 x10E6/uL (ref 4.14–5.80)
RDW: 13.8 % (ref 12.3–15.4)
WBC: 10.9 10*3/uL — ABNORMAL HIGH (ref 3.4–10.8)

## 2015-05-09 LAB — BRAIN NATRIURETIC PEPTIDE: BNP: 119.6 pg/mL — ABNORMAL HIGH (ref 0.0–100.0)

## 2015-05-09 IMAGING — CR DG CHEST 2V
1 series · 2 of 2 positions shown · non-contrast
Comparison: None.

CLINICAL DATA: Shortness of breath.

EXAM:
CHEST  2 VIEW

[Series 1: dg chest 2 view · 0.14mm/px · 2 of 2 slices shown]
[im 1/2]
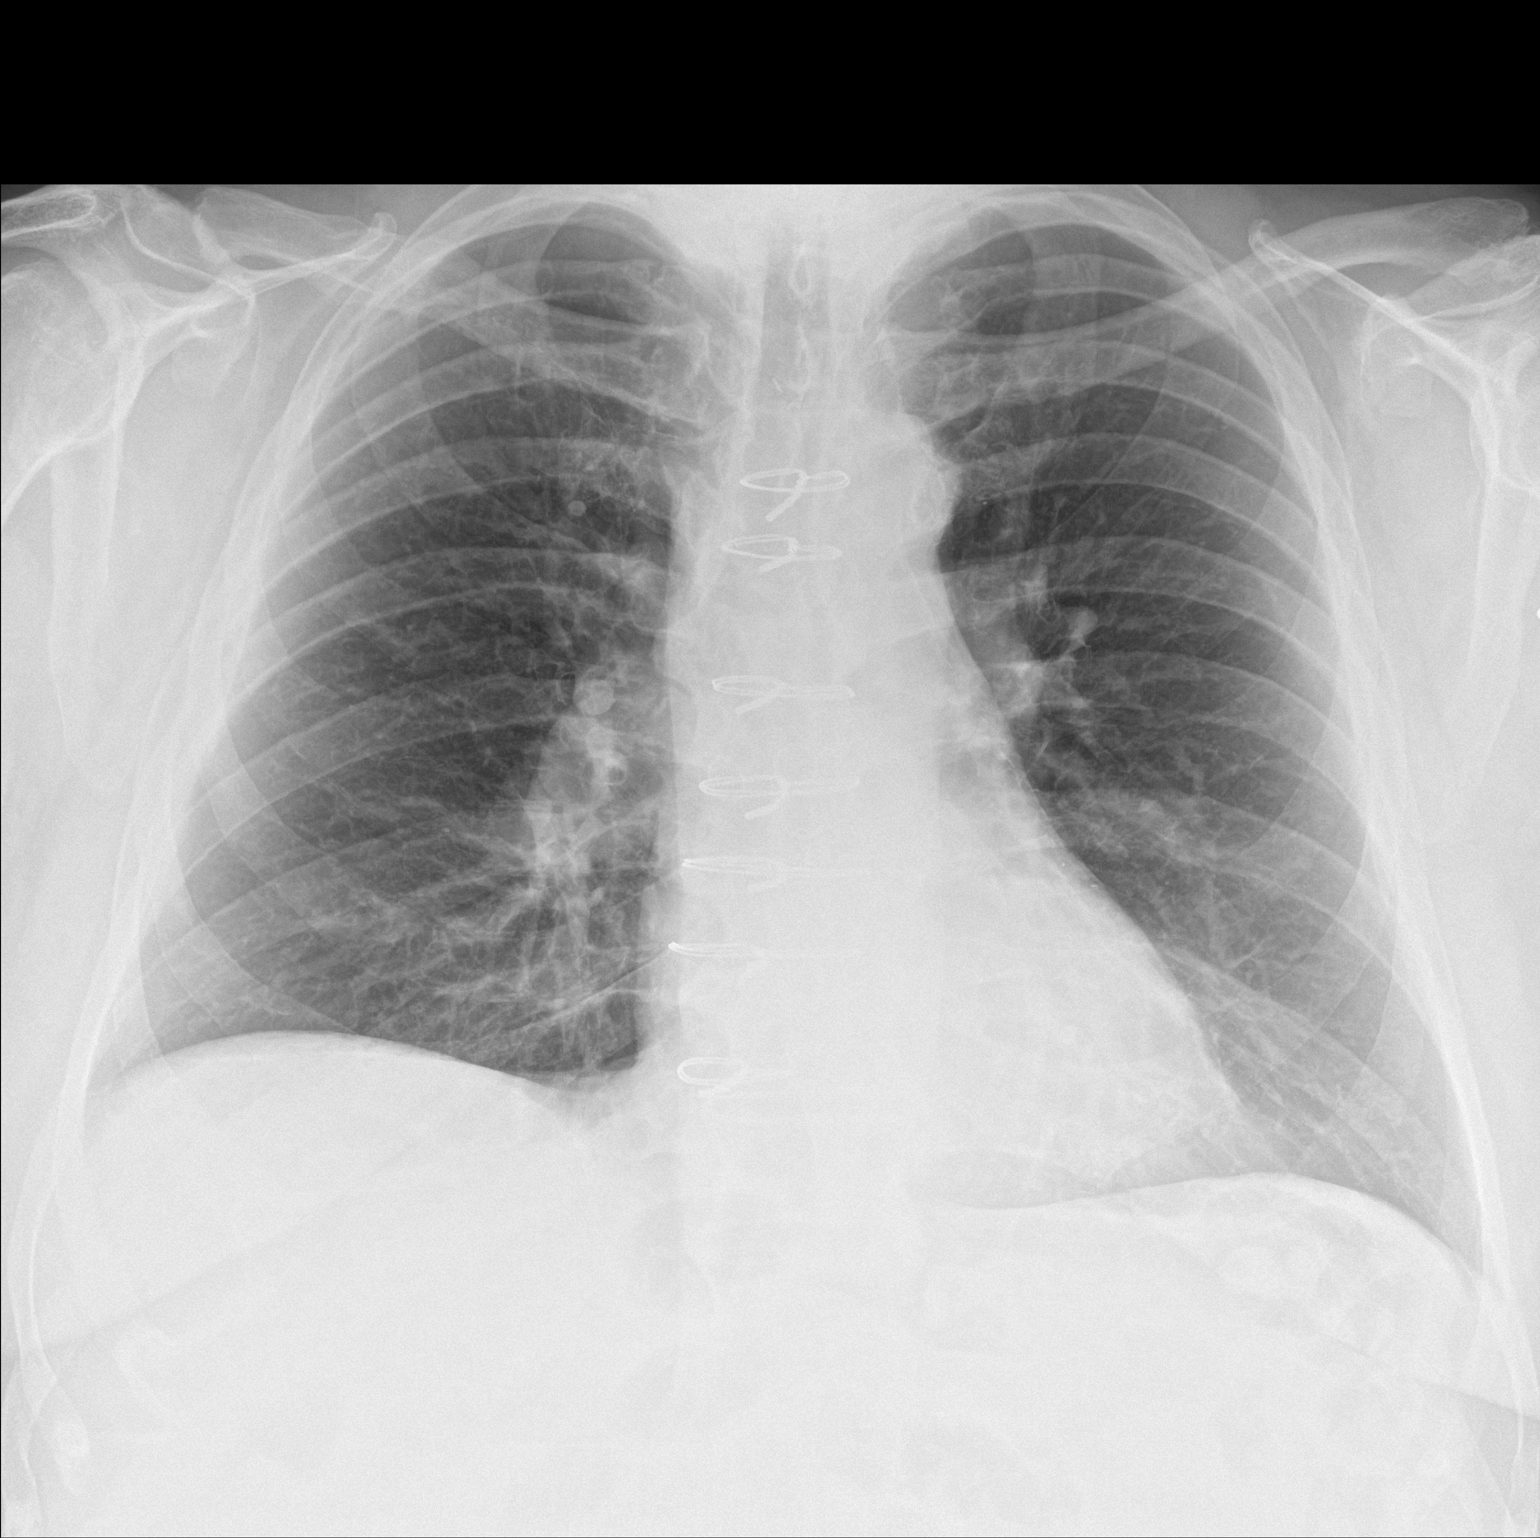
[im 2/2]
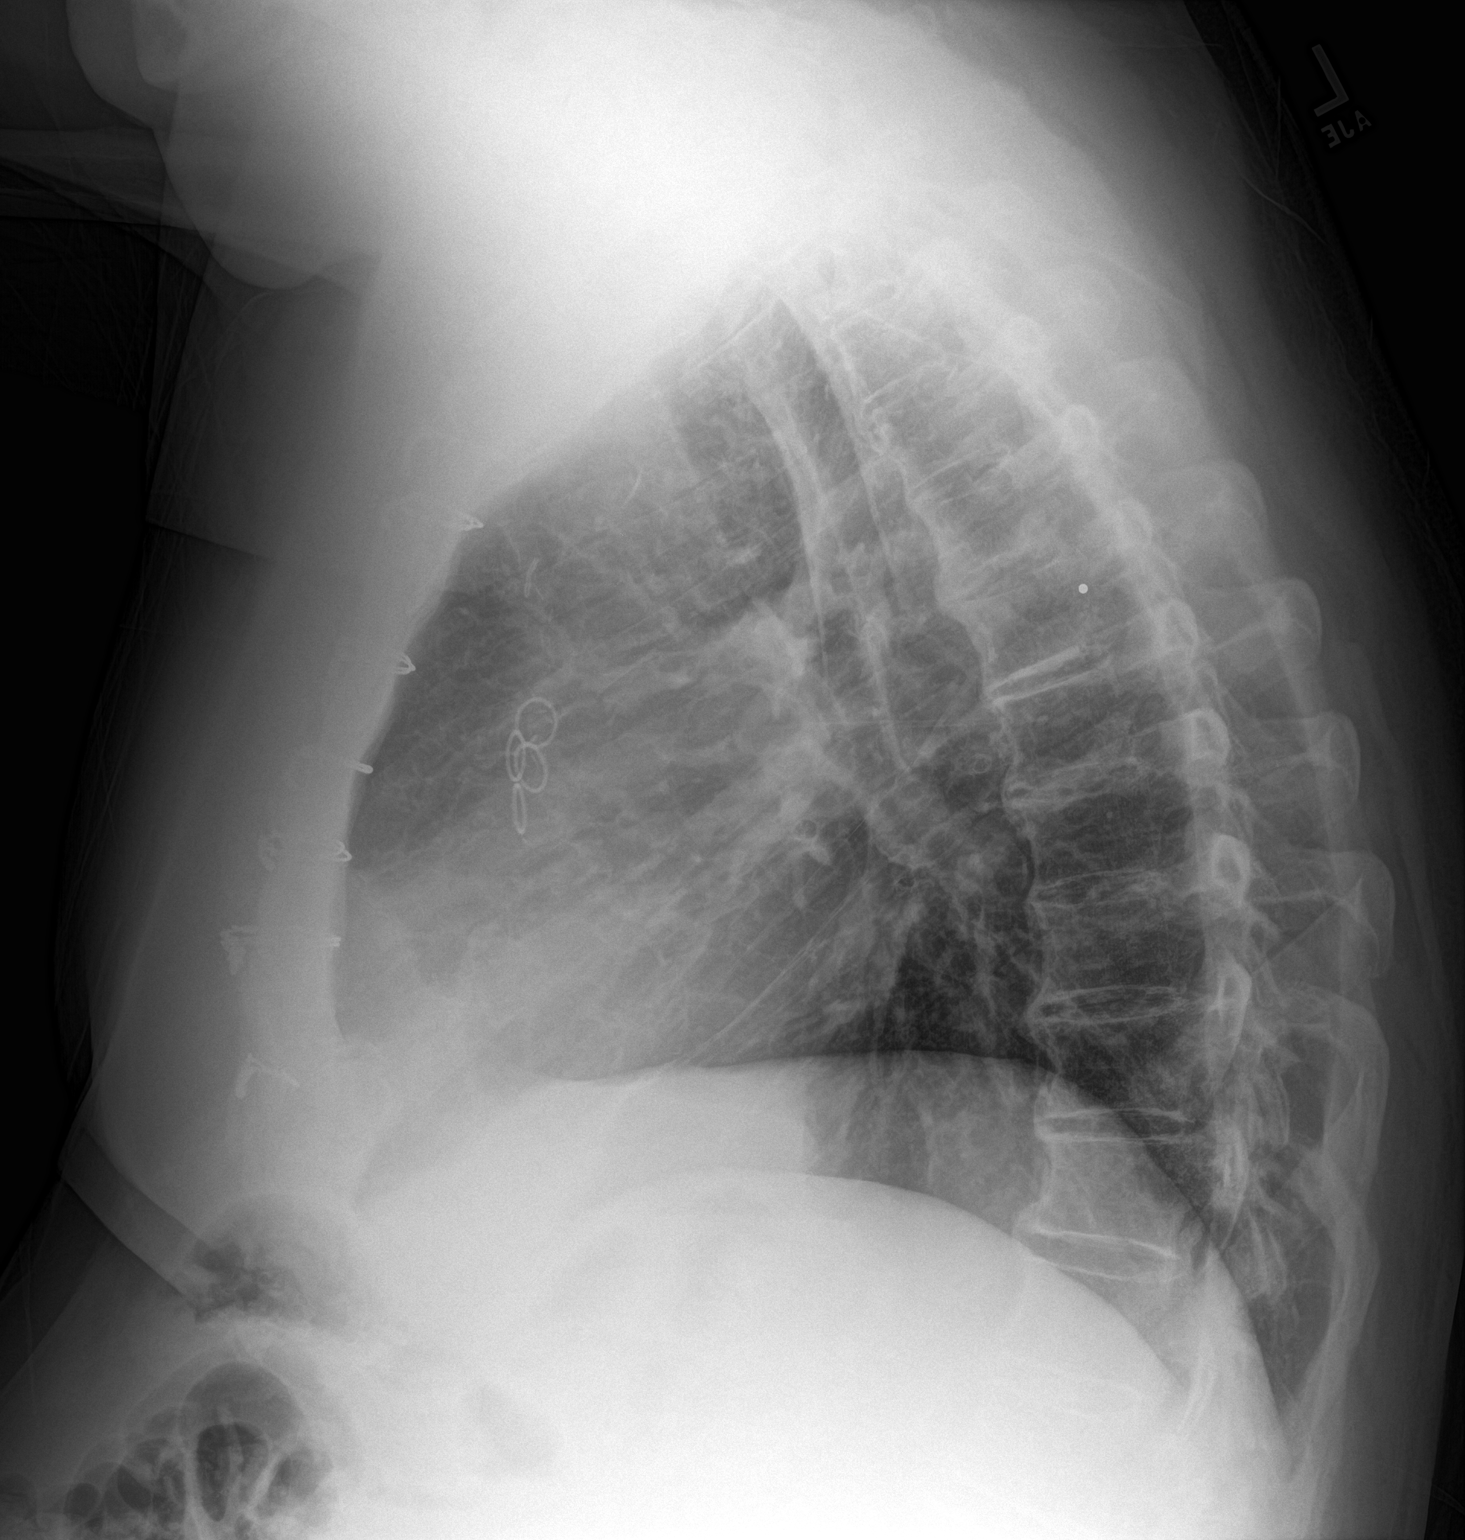

[2 of 2 positions shown; findings below may reference images not displayed]

FINDINGS: Prior CABG. Heart size normal. Normal pulmonary vascularity. No
focal pulmonary infiltrate. No pleural effusion or pneumothorax . No
acute bony abnormality.
IMPRESSION: 1. Prior CABG.  Heart size normal.
2. No acute cardiopulmonary disease.

## 2015-05-09 NOTE — Telephone Encounter (Signed)
Pt partner called, states she has a question regarding pt medication. Lasix and Lisinopril please call.

## 2015-05-09 NOTE — Telephone Encounter (Signed)
S/w pt friend, Rosey Bath, to confirm medications to hold Per Ward Givens, do not take lisinopril or lasix. Repeat labs, echo Friday. Rosey Bath verbalized understanding with no further questions.

## 2015-05-09 NOTE — Telephone Encounter (Signed)
S/w pt who will go to hospital for BMET as well as CXR. Orders placed at front desk.

## 2015-05-10 ENCOUNTER — Other Ambulatory Visit: Payer: Self-pay | Admitting: Nurse Practitioner

## 2015-05-10 DIAGNOSIS — R0600 Dyspnea, unspecified: Secondary | ICD-10-CM

## 2015-05-11 ENCOUNTER — Other Ambulatory Visit: Payer: Self-pay

## 2015-05-11 ENCOUNTER — Other Ambulatory Visit
Admission: RE | Admit: 2015-05-11 | Discharge: 2015-05-11 | Disposition: A | Discharge status: Home / Self Care | Payer: Medicare PPO | Source: Ambulatory Visit | Attending: Cardiovascular Disease | Admitting: Cardiovascular Disease

## 2015-05-11 ENCOUNTER — Ambulatory Visit (INDEPENDENT_AMBULATORY_CARE_PROVIDER_SITE_OTHER): Payer: Medicare PPO

## 2015-05-11 DIAGNOSIS — R0602 Shortness of breath: Secondary | ICD-10-CM | POA: Insufficient documentation

## 2015-05-11 LAB — BASIC METABOLIC PANEL
Anion gap: 6 (ref 5–15)
BUN: 43 mg/dL — AB (ref 6–20)
CALCIUM: 9.2 mg/dL (ref 8.9–10.3)
CO2: 25 mmol/L (ref 22–32)
CREATININE: 1.48 mg/dL — AB (ref 0.61–1.24)
Chloride: 107 mmol/L (ref 101–111)
GFR calc Af Amer: 53 mL/min — ABNORMAL LOW (ref >60)
GFR, EST NON AFRICAN AMERICAN: 46 mL/min — AB (ref >60)
GLUCOSE: 123 mg/dL — AB (ref 65–99)
POTASSIUM: 4.7 mmol/L (ref 3.5–5.1)
Sodium: 138 mmol/L (ref 135–145)

## 2015-05-14 ENCOUNTER — Other Ambulatory Visit: Payer: Self-pay | Admitting: Physician Assistant

## 2015-05-14 ENCOUNTER — Telehealth: Payer: Self-pay

## 2015-05-14 NOTE — Telephone Encounter (Signed)
Per result note: Notes Recorded by Iran Ouch, MD on 05/11/2015 at 4:46 PM Inform patient that labs showed improved renal function from 2 days ago. Continue same medications.  Reviewed results w/pt and confirmed 9/20 appt

## 2015-05-15 ENCOUNTER — Ambulatory Visit (INDEPENDENT_AMBULATORY_CARE_PROVIDER_SITE_OTHER): Payer: Medicare PPO | Admitting: Cardiovascular Disease

## 2015-05-15 ENCOUNTER — Encounter: Payer: Self-pay | Admitting: Cardiovascular Disease

## 2015-05-15 VITALS — BP 130/82 | HR 88 | Ht 70.0 in | Wt 293.0 lb

## 2015-05-15 DIAGNOSIS — I5022 Chronic systolic (congestive) heart failure: Secondary | ICD-10-CM

## 2015-05-15 DIAGNOSIS — R0602 Shortness of breath: Secondary | ICD-10-CM

## 2015-05-15 DIAGNOSIS — Z01812 Encounter for preprocedural laboratory examination: Secondary | ICD-10-CM

## 2015-05-15 DIAGNOSIS — E785 Hyperlipidemia, unspecified: Secondary | ICD-10-CM

## 2015-05-15 DIAGNOSIS — I25118 Atherosclerotic heart disease of native coronary artery with other forms of angina pectoris: Secondary | ICD-10-CM | POA: Diagnosis not present

## 2015-05-15 DIAGNOSIS — I493 Ventricular premature depolarization: Secondary | ICD-10-CM

## 2015-05-15 MED ORDER — CARVEDILOL 6.25 MG PO TABS: 6.2500 mg | ORAL_TABLET | Freq: Two times a day (BID) | ORAL | Status: DC

## 2015-05-15 NOTE — Progress Notes (Signed)
 HPI  This is a 74 y/o male who is here today for follow-up visit regarding CAD s/p CABG and chronic systolic heart failure. He was  admitted to [facility] in August, 2015 with chest pain and ruled in for NSTEMI. Echo showed severe LV dysfxn and cath revealed severe 3VD. He underwent CABG on October 13 by Dr. Van Trigt with LIMA to diagonal, SVG to LAD, SVG to OM1, SVG to OM 2 and SVG to right PDA. Intraoperative TEE showed only mild MR with improvement of ejection fraction to 40-45%.  Postoperative course was remarkable for atrial fibrillation which was treated with amiodarone.   Repeat echocardiogram in May 2016 showed an ejection fraction of 40-45% with no significant valvular abnormalities. Fluid overload improved with Lasix.   He presented recently with worsening exertional dyspnea very similar to his presentation last year. He was initially given neck supple to expect his labs showed some volume depletion and BNP was not significantly high. An echocardiogram was done which showed a drop in his LV systolic function with an ejection fraction of 30-35%. He reports no chest pain. However, even when he presented with non-ST elevation myocardial infarction he did not have any chest discomfort at that time. His angina was dyspnea.    No Known Allergies   Current Outpatient Prescriptions on File Prior to Visit  Medication Sig Dispense Refill  . aspirin EC 325 MG EC tablet Take 1 tablet (325 mg total) by mouth daily. 30 tablet 0  . atorvastatin (LIPITOR) 40 MG tablet Take 1 tablet (40 mg total) by mouth daily. 90 tablet 6  . colchicine 0.6 MG tablet Take 0.6 mg by mouth daily as needed (for gout flareup).     . cyanocobalamin 500 MCG tablet Take 500 mcg by mouth daily.    . furosemide (LASIX) 40 MG tablet Take 1 tablet (40 mg total) by mouth daily. 30 tablet 6  . gabapentin (NEURONTIN) 300 MG capsule Take 300 mg by mouth 4 (four) times daily.     . lisinopril (PRINIVIL,ZESTRIL) 20 MG tablet Take 1  tablet (20 mg total) by mouth every morning. 90 tablet 3  . metFORMIN (GLUCOPHAGE) 1000 MG tablet Take 1 tablet (1,000 mg total) by mouth 2 (two) times daily with a meal. 60 tablet 11  . Misc Natural Products (BLACK CHERRY CONCENTRATE PO) Take 1 capsule by mouth daily.     No current facility-administered medications on file prior to visit.     Past Medical History  Diagnosis Date  . Hypertension   . Hyperlipidemia   . Gout   . Diabetes mellitus without complication   . Nephrolithiasis   . Umbilical hernia   . Epigastric hernia   . Obesity   . Chronic systolic CHF (congestive heart failure)     a. EF 25-30% by 2D ECHO 04/20/14. Severely dec global hypokinesis. mildly elevated RVSP;  b. 12/2014 Echo: EF 40-45%, mod dil LA, mildly dil RV/RA.  . AAA (abdominal aortic aneurysm)   . Ischemic cardiomyopathy     a. EF 25-30% 03/2014;  b. EF 40-45% 12/2014.  . Mitral regurgitation     a. 03/2014 TEE: mild to mod MR.  . Myocardial infarction   . Neuromuscular disorder     DIABETIC NEUROPATHY  . Arthritis   . CAD (coronary artery disease)     a. 03/2014 NSTEMI/Cath: LM nl, LAD 80m - hazy, D1 90p, D2 100, D3 min irregs, LCX 95m, OM2 95, OM3 90 small, RCA 70m;  b.   05/2014 CABG x 5 (LIMA->D1, VG->LAD, VG->OM1, VG->OM2, VG->RPDA).     Past Surgical History  Procedure Laterality Date  . Rotator cuff repair Right 1996,1997,2000  . Carpal tunnel release Right   . Tee without cardioversion N/A 04/24/2014    Procedure: TRANSESOPHAGEAL ECHOCARDIOGRAM (TEE);  Surgeon: [name], MD;  Location: [facility] ENDOSCOPY;  Service: Cardiovascular;  Laterality: N/A;  . Cardiac catheterization      [facility]  . Coronary artery bypass graft N/A 06/06/2014    Procedure: CORONARY ARTERY BYPASS GRAFTING (CABG) x 5 USING LEFT AND RIGHT SAPHENOUS LEG VEIN HARVESTED ENDOSCOPICALLY;  Surgeon: Kerin Perna, MD;  Location: [facility] OR;  Service: Open Heart Surgery;  Laterality: N/A;  . Intraoperative transesophageal  echocardiogram N/A 06/06/2014    Procedure: INTRAOPERATIVE TRANSESOPHAGEAL ECHOCARDIOGRAM;  Surgeon: Kerin Perna, MD;  Location: [facility] OR;  Service: Open Heart Surgery;  Laterality: N/A;     Family History  Problem Relation Age of Onset  . Colon polyps Brother   . Lung cancer Brother   . Throat cancer Father   . Stroke Father      Social History   Social History  . Marital Status: Single    Spouse Name: N/A  . Number of Children: N/A  . Years of Education: N/A   Occupational History  . Not on file.   Social History Main Topics  . Smoking status: Former Smoker -- 1.00 packs/day for 25 years    Types: Cigarettes  . Smokeless tobacco: Never Used  . Alcohol Use: No  . Drug Use: No  . Sexual Activity: Not on file   Other Topics Concern  . Not on file   Social History Narrative      PHYSICAL EXAM   BP 130/82 mmHg  Pulse 88  Ht 5\' 10"  (1.778 m)  Wt 293 lb (132.904 kg)  BMI 42.04 kg/m2 Constitutional: He is oriented to person, place, and time. He appears well-developed and well-nourished. No distress.  HENT: No nasal discharge.  Head: Normocephalic and atraumatic.  Eyes: Pupils are equal and round.  No discharge. Neck: Normal range of motion. Neck supple. No JVD present. No thyromegaly present.  Cardiovascular: Normal rate, regular rhythm, normal heart sounds. Exam reveals no gallop and no friction rub. No murmur heard. Well-healing surgical scar. Pulmonary/Chest: Effort normal and breath sounds normal. No stridor. No respiratory distress. He has no wheezes. He has no rales. He exhibits no tenderness.  Abdominal: Soft. Bowel sounds are normal. He exhibits no distension. There is no tenderness. There is no rebound and no guarding.  Musculoskeletal: Normal range of motion. He exhibits trace edema and no tenderness. Bruising at the vein harvest sites. Neurological: He is alert and oriented to person, place, and time. Coordination normal.  Skin: Skin is warm and dry.  No rash noted. He is not diaphoretic. No erythema. No pallor.  Psychiatric: He has a normal mood and affect. His behavior is normal. Judgment and thought content normal.  Vascular: Distal pulses are not palpable.      ASSESSMENT AND PLAN

## 2015-05-15 NOTE — Assessment & Plan Note (Signed)
This is noted on physical exam today. The dose of carvedilol was increased as outlined above.

## 2015-05-15 NOTE — Assessment & Plan Note (Signed)
Lab Results  Component Value Date   CHOL 95* 12/25/2014   HDL 25* 12/25/2014   LDLCALC 44 12/25/2014   TRIG 129 12/25/2014   CHOLHDL 3.8 12/25/2014   Continue treatment with atorvastatin. LDL was at target.

## 2015-05-15 NOTE — Patient Instructions (Addendum)
Medication Instructions:  Please increase your Coreg to 6.25 mg twice daily  Labwork: CBC, BMET, PT/INR (pre-cath labs)  Testing/Procedures: Your physician has requested that you have a cardiac catheterization. Cardiac catheterization is used to diagnose and/or treat various heart conditions. Doctors may recommend this procedure for a number of different reasons. The most common reason is to evaluate chest pain. Chest pain can be a symptom of coronary artery disease (CAD), and cardiac catheterization can show whether plaque is narrowing or blocking your heart's arteries. This procedure is also used to evaluate the valves, as well as measure the blood flow and oxygen levels in different parts of your heart. For further information please visit [email or web address] Please follow instruction sheet, as given.   Cardiac Cath Instructions   You are scheduled for a Cardiac Cath on: Sept 28, 10:00 at [facility]  Please arrive at  8:00am on the day of your procedurely  Do not eat/drink anything after midnight  Someone will need to drive you home  It is recommended someone be with you for the first 24 hours after your procedure  Wear clothes that are easy to get on/off and wear slip on shoes if possible   Medications bring a current list of all medications with you    _xx__ Do not take these medications before your procedure: Do not take metformin 24 hours before and 48 hours after your procedure  You may hold your lasix the morning of your procedure for comfort.   Day of your procedure:  The usual length of stay after your procedure is about 2 to 3 hours.  This can vary.  If you have any questions, please call our office at [phone]  Follow-Up: 3 weeks  Any Other Special Instructions Will Be Listed Below     Angiogram An angiogram, also called angiography, is a procedure used to look at the blood vessels that carry blood to different parts of your body (arteries).  In this procedure, dye is injected through a long, thin tube (catheter) into an artery. X-rays are then taken. The X-rays will show if there is a blockage or problem in a blood vessel.  LET [facility] CARE PROVIDER KNOW ABOUT: 7. Any allergies you have, including allergies to shellfish or contrast dye.  8. All medicines you are taking, including vitamins, herbs, eye drops, creams, and over-the-counter medicines.  9. Previous problems you or members of your family have had with the use of anesthetics.  10. Any blood disorders you have.  11. Previous surgeries you have had. 12. Any previous kidney problems or failure you have had. 13. Medical conditions you have.  14. Possibility of pregnancy, if this applies. RISKS AND COMPLICATIONS Generally, an angiogram is a safe procedure. However, as with any procedure, problems can occur. Possible problems include:  Injury to the blood vessels, including rupture or bleeding.  Infection or bruising at the catheter site.  Allergic reaction to the dye or contrast used.  Kidney damage from the dye or contrast used.  Blood clots that can lead to a stroke or heart attack. BEFORE THE PROCEDURE  Do not eat or drink after midnight on the night before the procedure, or as directed by your health care provider.   Ask your health care provider if you may drink enough water to take any needed medicines the morning of the procedure.  PROCEDURE  You may be given a medicine to help you relax (sedative) before and during the procedure. This medicine is given  through an IV access tube that is inserted into one of your veins.   The area where the catheter will be inserted will be washed and shaved. This is usually done in the groin but may be done in the fold of your arm (near your elbow) or in the wrist.  A medicine will be given to numb the area where the catheter will be inserted (local anesthetic).  The catheter will be inserted with a guide wire  into an artery. The catheter is guided by using a type of X-ray (fluoroscopy) to the blood vessel being examined.   Dye is then injected into the catheter, and X-rays are taken. The dye helps to show where any narrowing or blockages are located.  AFTER THE PROCEDURE   If the procedure is done through the leg, you will be kept in bed lying flat for several hours. You will be instructed to not bend or cross your legs.  The insertion site will be checked frequently.  The pulse in your feet or wrist will be checked frequently.  Additional blood tests, X-rays, and electrocardiography may be done.   You may need to stay in the hospital overnight for observation.  Document Released: 05/21/2005 Document Revised: 08/16/2013 Document Reviewed: 01/12/2013 [facility] Patient Information 2015 [place], [place]. This information is not intended to replace advice given to you by your health care provider. Make sure you discuss any questions you have with your health care provider.

## 2015-05-15 NOTE — Assessment & Plan Note (Signed)
The patient is having significant worsening of exertional dyspnea without significant evidence of fluid overload. This has been associated with a drop in his ejection fraction. His symptoms are very similar to prior myocardial infarction last year. Due to all of that, I recommend proceeding with left heart catheterization and possible coronary intervention. Will avoid left ventricular angiography due to chronic kidney disease. Plan access is via the left radial artery. I discussed the risks and benefits with him.

## 2015-05-15 NOTE — Assessment & Plan Note (Signed)
He appears to be euvolemic. I increased the dose of carvedilol to 6.25 mg twice daily.

## 2015-05-16 LAB — BASIC METABOLIC PANEL
BUN/Creatinine Ratio: 18 (ref 10–22)
BUN: 21 mg/dL (ref 8–27)
CALCIUM: 9.3 mg/dL (ref 8.6–10.2)
CHLORIDE: 104 mmol/L (ref 97–108)
CO2: 21 mmol/L (ref 18–29)
Creatinine, Ser: 1.16 mg/dL (ref 0.76–1.27)
GFR calc non Af Amer: 63 mL/min/{1.73_m2} (ref >59)
GFR, EST AFRICAN AMERICAN: 73 mL/min/{1.73_m2} (ref >59)
Glucose: 103 mg/dL — ABNORMAL HIGH (ref 65–99)
POTASSIUM: 4.8 mmol/L (ref 3.5–5.2)
Sodium: 142 mmol/L (ref 134–144)

## 2015-05-16 LAB — CBC WITH DIFFERENTIAL/PLATELET
BASOS ABS: 0.1 10*3/uL (ref 0.0–0.2)
BASOS: 1 %
EOS (ABSOLUTE): 0.4 10*3/uL (ref 0.0–0.4)
EOS: 4 %
HEMATOCRIT: 40.5 % (ref 37.5–51.0)
HEMOGLOBIN: 13 g/dL (ref 12.6–17.7)
IMMATURE GRANS (ABS): 0 10*3/uL (ref 0.0–0.1)
Immature Granulocytes: 0 %
LYMPHS ABS: 2.3 10*3/uL (ref 0.7–3.1)
LYMPHS: 22 %
MCH: 29.1 pg (ref 26.6–33.0)
MCHC: 32.1 g/dL (ref 31.5–35.7)
MCV: 91 fL (ref 79–97)
MONOCYTES: 9 %
Monocytes Absolute: 0.9 10*3/uL (ref 0.1–0.9)
NEUTROS ABS: 6.8 10*3/uL (ref 1.4–7.0)
Neutrophils: 64 %
PLATELETS: 220 10*3/uL (ref 150–379)
RBC: 4.46 x10E6/uL (ref 4.14–5.80)
RDW: 13.6 % (ref 12.3–15.4)
WBC: 10.5 10*3/uL (ref 3.4–10.8)

## 2015-05-16 LAB — PROTIME-INR
INR: 1.1 (ref 0.8–1.2)
PROTHROMBIN TIME: 11.8 s (ref 9.1–12.0)

## 2015-05-21 ENCOUNTER — Telehealth: Payer: Self-pay

## 2015-05-21 NOTE — Telephone Encounter (Signed)
S/w Aggie Cosier regarding Wed cardiac cath. Reviewed instructions Reminded to stop metformin 24hours before and 48 hours after procedure. Pt partner, Aggie Cosier, verbalized understanding with no further questions.

## 2015-05-21 NOTE — Telephone Encounter (Signed)
Pt partner called, has some questions regarding medications on the day of his cath, this Wednesday 05/23/2015. Please call.

## 2015-05-23 ENCOUNTER — Ambulatory Visit (HOSPITAL_COMMUNITY)
Admission: RE | Admit: 2015-05-23 | Discharge: 2015-05-23 | Disposition: A | Discharge status: Home / Self Care | Payer: Medicare PPO | Source: Ambulatory Visit | Attending: Cardiovascular Disease | Admitting: Cardiovascular Disease

## 2015-05-23 ENCOUNTER — Encounter (HOSPITAL_COMMUNITY)
Admission: RE | Disposition: A | Discharge status: Home / Self Care | Payer: Self-pay | Source: Ambulatory Visit | Attending: Cardiovascular Disease

## 2015-05-23 DIAGNOSIS — Z6841 Body Mass Index (BMI) 40.0 and over, adult: Secondary | ICD-10-CM | POA: Diagnosis not present

## 2015-05-23 DIAGNOSIS — I4891 Unspecified atrial fibrillation: Secondary | ICD-10-CM | POA: Diagnosis not present

## 2015-05-23 DIAGNOSIS — I340 Nonrheumatic mitral (valve) insufficiency: Secondary | ICD-10-CM | POA: Insufficient documentation

## 2015-05-23 DIAGNOSIS — Z7982 Long term (current) use of aspirin: Secondary | ICD-10-CM | POA: Diagnosis not present

## 2015-05-23 DIAGNOSIS — E669 Obesity, unspecified: Secondary | ICD-10-CM | POA: Diagnosis not present

## 2015-05-23 DIAGNOSIS — E785 Hyperlipidemia, unspecified: Secondary | ICD-10-CM | POA: Insufficient documentation

## 2015-05-23 DIAGNOSIS — I255 Ischemic cardiomyopathy: Secondary | ICD-10-CM | POA: Diagnosis not present

## 2015-05-23 DIAGNOSIS — M109 Gout, unspecified: Secondary | ICD-10-CM | POA: Insufficient documentation

## 2015-05-23 DIAGNOSIS — M1990 Unspecified osteoarthritis, unspecified site: Secondary | ICD-10-CM | POA: Diagnosis not present

## 2015-05-23 DIAGNOSIS — E1140 Type 2 diabetes mellitus with diabetic neuropathy, unspecified: Secondary | ICD-10-CM | POA: Insufficient documentation

## 2015-05-23 DIAGNOSIS — Z87442 Personal history of urinary calculi: Secondary | ICD-10-CM | POA: Diagnosis not present

## 2015-05-23 DIAGNOSIS — I714 Abdominal aortic aneurysm, without rupture: Secondary | ICD-10-CM | POA: Insufficient documentation

## 2015-05-23 DIAGNOSIS — I252 Old myocardial infarction: Secondary | ICD-10-CM | POA: Diagnosis not present

## 2015-05-23 DIAGNOSIS — I5022 Chronic systolic (congestive) heart failure: Secondary | ICD-10-CM | POA: Insufficient documentation

## 2015-05-23 DIAGNOSIS — I10 Essential (primary) hypertension: Secondary | ICD-10-CM | POA: Insufficient documentation

## 2015-05-23 DIAGNOSIS — Z87891 Personal history of nicotine dependence: Secondary | ICD-10-CM | POA: Diagnosis not present

## 2015-05-23 DIAGNOSIS — I2582 Chronic total occlusion of coronary artery: Secondary | ICD-10-CM | POA: Diagnosis not present

## 2015-05-23 DIAGNOSIS — I25118 Atherosclerotic heart disease of native coronary artery with other forms of angina pectoris: Secondary | ICD-10-CM | POA: Insufficient documentation

## 2015-05-23 DIAGNOSIS — I209 Angina pectoris, unspecified: Secondary | ICD-10-CM | POA: Diagnosis present

## 2015-05-23 DIAGNOSIS — I25719 Atherosclerosis of autologous vein coronary artery bypass graft(s) with unspecified angina pectoris: Secondary | ICD-10-CM | POA: Diagnosis not present

## 2015-05-23 HISTORY — PX: CARDIAC CATHETERIZATION: SHX172

## 2015-05-23 LAB — GLUCOSE, CAPILLARY
GLUCOSE-CAPILLARY: 122 mg/dL — AB (ref 65–99)
GLUCOSE-CAPILLARY: 99 mg/dL (ref 65–99)

## 2015-05-23 SURGERY — LEFT HEART CATH AND CORONARY ANGIOGRAPHY
Anesthesia: LOCAL

## 2015-05-23 MED ORDER — HEPARIN (PORCINE) IN NACL 2-0.9 UNIT/ML-% IJ SOLN
INTRAMUSCULAR | Status: AC
  Filled 2015-05-23: qty 1000

## 2015-05-23 MED ORDER — FENTANYL CITRATE (PF) 100 MCG/2ML IJ SOLN
INTRAMUSCULAR | Status: DC | PRN
  Administered 2015-05-23: 25 ug via INTRAVENOUS

## 2015-05-23 MED ORDER — ASPIRIN 81 MG PO CHEW: 81.0000 mg | CHEWABLE_TABLET | ORAL | Status: DC

## 2015-05-23 MED ORDER — SODIUM CHLORIDE 0.9 % IV SOLN: 250.0000 mL | INTRAVENOUS | Status: DC | PRN

## 2015-05-23 MED ORDER — VERAPAMIL HCL 2.5 MG/ML IV SOLN
INTRAVENOUS | Status: DC | PRN
  Administered 2015-05-23: 13:00:00 via INTRA_ARTERIAL

## 2015-05-23 MED ORDER — LIDOCAINE HCL (PF) 1 % IJ SOLN
INTRAMUSCULAR | Status: DC | PRN
  Administered 2015-05-23: 14:00:00

## 2015-05-23 MED ORDER — SODIUM CHLORIDE 0.9 % IV SOLN: INTRAVENOUS | Status: DC

## 2015-05-23 MED ORDER — SODIUM CHLORIDE 0.9 % IJ SOLN: 3.0000 mL | Freq: Two times a day (BID) | INTRAMUSCULAR | Status: DC

## 2015-05-23 MED ORDER — MIDAZOLAM HCL 2 MG/2ML IJ SOLN
INTRAMUSCULAR | Status: AC
  Filled 2015-05-23: qty 4

## 2015-05-23 MED ORDER — SODIUM CHLORIDE 0.9 % IV SOLN
INTRAVENOUS | Status: DC
  Administered 2015-05-23: 08:00:00 via INTRAVENOUS

## 2015-05-23 MED ORDER — FENTANYL CITRATE (PF) 100 MCG/2ML IJ SOLN
INTRAMUSCULAR | Status: AC
  Filled 2015-05-23: qty 4

## 2015-05-23 MED ORDER — LIDOCAINE HCL (PF) 1 % IJ SOLN
INTRAMUSCULAR | Status: AC
  Filled 2015-05-23: qty 30

## 2015-05-23 MED ORDER — IOHEXOL 350 MG/ML SOLN
INTRAVENOUS | Status: DC | PRN
  Administered 2015-05-23: 100 mL via INTRAVENOUS

## 2015-05-23 MED ORDER — SODIUM CHLORIDE 0.9 % IJ SOLN: 3.0000 mL | INTRAMUSCULAR | Status: DC | PRN

## 2015-05-23 MED ORDER — MIDAZOLAM HCL 2 MG/2ML IJ SOLN
INTRAMUSCULAR | Status: DC | PRN
  Administered 2015-05-23: 1 mg via INTRAVENOUS

## 2015-05-23 MED ORDER — HEPARIN SODIUM (PORCINE) 1000 UNIT/ML IJ SOLN
INTRAMUSCULAR | Status: AC
  Filled 2015-05-23: qty 1

## 2015-05-23 MED ORDER — HEPARIN SODIUM (PORCINE) 1000 UNIT/ML IJ SOLN
INTRAMUSCULAR | Status: DC | PRN
  Administered 2015-05-23: 6000 [IU] via INTRAVENOUS

## 2015-05-23 MED ORDER — VERAPAMIL HCL 2.5 MG/ML IV SOLN
INTRAVENOUS | Status: AC
  Filled 2015-05-23: qty 2

## 2015-05-23 SURGICAL SUPPLY — 12 items
CATH INFINITI 5FR ANG PIGTAIL (CATHETERS) ×2 IMPLANT
CATH INFINITI 5FR JL4 (CATHETERS) ×1 IMPLANT
CATH OPTITORQUE JACKY 4.0 5F (CATHETERS) ×2 IMPLANT
DEVICE RAD COMP TR BAND LRG (VASCULAR PRODUCTS) ×2 IMPLANT
GLIDESHEATH SLEND SS 6F .021 (SHEATH) ×2 IMPLANT
HOVERMATT SINGLE USE (MISCELLANEOUS) ×1 IMPLANT
KIT HEART LEFT (KITS) ×2 IMPLANT
PACK CARDIAC CATHETERIZATION (CUSTOM PROCEDURE TRAY) ×2 IMPLANT
SYR MEDRAD MARK V 150ML (SYRINGE) ×2 IMPLANT
TRANSDUCER W/STOPCOCK (MISCELLANEOUS) ×2 IMPLANT
TUBING CIL FLEX 10 FLL-RA (TUBING) ×3 IMPLANT
WIRE SAFE-T 1.5MM-J .035X260CM (WIRE) ×2 IMPLANT

## 2015-05-23 NOTE — Progress Notes (Signed)
Client discharged

## 2015-05-23 NOTE — H&P (View-Only) (Signed)
HPI  This is a 74 y/o male who is here today for follow-up visit regarding CAD s/p CABG and chronic systolic heart failure. He was  admitted to [facility] in August, 2015 with chest pain and ruled in for NSTEMI. Echo showed severe LV dysfxn and cath revealed severe 3VD. He underwent CABG on October 13 by Dr. Donata Clay with LIMA to diagonal, SVG to LAD, SVG to OM1, SVG to OM 2 and SVG to right PDA. Intraoperative TEE showed only mild MR with improvement of ejection fraction to 40-45%.  Postoperative course was remarkable for atrial fibrillation which was treated with amiodarone.   Repeat echocardiogram in May 2016 showed an ejection fraction of 40-45% with no significant valvular abnormalities. Fluid overload improved with Lasix.   He presented recently with worsening exertional dyspnea very similar to his presentation last year. He was initially given neck supple to expect his labs showed some volume depletion and BNP was not significantly high. An echocardiogram was done which showed a drop in his LV systolic function with an ejection fraction of 30-35%. He reports no chest pain. However, even when he presented with non-ST elevation myocardial infarction he did not have any chest discomfort at that time. His angina was dyspnea.    No Known Allergies   Current Outpatient Prescriptions on File Prior to Visit  Medication Sig Dispense Refill  . aspirin EC 325 MG EC tablet Take 1 tablet (325 mg total) by mouth daily. 30 tablet 0  . atorvastatin (LIPITOR) 40 MG tablet Take 1 tablet (40 mg total) by mouth daily. 90 tablet 6  . colchicine 0.6 MG tablet Take 0.6 mg by mouth daily as needed (for gout flareup).     . cyanocobalamin 500 MCG tablet Take 500 mcg by mouth daily.    . furosemide (LASIX) 40 MG tablet Take 1 tablet (40 mg total) by mouth daily. 30 tablet 6  . gabapentin (NEURONTIN) 300 MG capsule Take 300 mg by mouth 4 (four) times daily.     [name] lisinopril (PRINIVIL,ZESTRIL) 20 MG tablet Take 1  tablet (20 mg total) by mouth every morning. 90 tablet 3  . metFORMIN (GLUCOPHAGE) 1000 MG tablet Take 1 tablet (1,000 mg total) by mouth 2 (two) times daily with a meal. 60 tablet 11  . Misc Natural Products (BLACK CHERRY CONCENTRATE PO) Take 1 capsule by mouth daily.     No current facility-administered medications on file prior to visit.     Past Medical History  Diagnosis Date  . Hypertension   . Hyperlipidemia   . Gout   . Diabetes mellitus without complication   . Nephrolithiasis   . Umbilical hernia   . Epigastric hernia   . Obesity   . Chronic systolic CHF (congestive heart failure)     a. EF 25-30% by 2D ECHO 04/20/14. Severely dec global hypokinesis. mildly elevated RVSP;  b. 12/2014 Echo: EF 40-45%, mod dil LA, mildly dil RV/RA.  [name] AAA (abdominal aortic aneurysm)   . Ischemic cardiomyopathy     a. EF 25-30% 03/2014;  b. EF 40-45% 12/2014.  . Mitral regurgitation     a. 03/2014 TEE: mild to mod MR.  . Myocardial infarction   . Neuromuscular disorder     DIABETIC NEUROPATHY  . Arthritis   . CAD (coronary artery disease)     a. 03/2014 NSTEMI/Cath: LM nl, LAD 15m - hazy, D1 90p, D2 100, D3 min irregs, LCX 33m, OM2 95, OM3 90 small, RCA 97m;  b.  05/2014 CABG x 5 (LIMA->D1, VG->LAD, VG->OM1, VG->OM2, VG->RPDA).     Past Surgical History  Procedure Laterality Date  . Rotator cuff repair Right 1996,1997,2000  . Carpal tunnel release Right   . Tee without cardioversion N/A 04/24/2014    Procedure: TRANSESOPHAGEAL ECHOCARDIOGRAM (TEE);  Surgeon: [name], MD;  Location: [facility] ENDOSCOPY;  Service: Cardiovascular;  Laterality: N/A;  . Cardiac catheterization      [facility]  . Coronary artery bypass graft N/A 06/06/2014    Procedure: CORONARY ARTERY BYPASS GRAFTING (CABG) x 5 USING LEFT AND RIGHT SAPHENOUS LEG VEIN HARVESTED ENDOSCOPICALLY;  Surgeon: Kerin Perna, MD;  Location: [facility] OR;  Service: Open Heart Surgery;  Laterality: N/A;  . Intraoperative transesophageal  echocardiogram N/A 06/06/2014    Procedure: INTRAOPERATIVE TRANSESOPHAGEAL ECHOCARDIOGRAM;  Surgeon: Kerin Perna, MD;  Location: [facility] OR;  Service: Open Heart Surgery;  Laterality: N/A;     Family History  Problem Relation Age of Onset  . Colon polyps Brother   . Lung cancer Brother   . Throat cancer Father   . Stroke Father      Social History   Social History  . Marital Status: Single    Spouse Name: N/A  . Number of Children: N/A  . Years of Education: N/A   Occupational History  . Not on file.   Social History Main Topics  . Smoking status: Former Smoker -- 1.00 packs/day for 25 years    Types: Cigarettes  . Smokeless tobacco: Never Used  . Alcohol Use: No  . Drug Use: No  . Sexual Activity: Not on file   Other Topics Concern  . Not on file   Social History Narrative      PHYSICAL EXAM   BP 130/82 mmHg  Pulse 88  Ht 5\' 10"  (1.778 m)  Wt 293 lb (132.904 kg)  BMI 42.04 kg/m2 Constitutional: He is oriented to person, place, and time. He appears well-developed and well-nourished. No distress.  HENT: No nasal discharge.  Head: Normocephalic and atraumatic.  Eyes: Pupils are equal and round.  No discharge. Neck: Normal range of motion. Neck supple. No JVD present. No thyromegaly present.  Cardiovascular: Normal rate, regular rhythm, normal heart sounds. Exam reveals no gallop and no friction rub. No murmur heard. Well-healing surgical scar. Pulmonary/Chest: Effort normal and breath sounds normal. No stridor. No respiratory distress. He has no wheezes. He has no rales. He exhibits no tenderness.  Abdominal: Soft. Bowel sounds are normal. He exhibits no distension. There is no tenderness. There is no rebound and no guarding.  Musculoskeletal: Normal range of motion. He exhibits trace edema and no tenderness. Bruising at the vein harvest sites. Neurological: He is alert and oriented to person, place, and time. Coordination normal.  Skin: Skin is warm and dry.  No rash noted. He is not diaphoretic. No erythema. No pallor.  Psychiatric: He has a normal mood and affect. His behavior is normal. Judgment and thought content normal.  Vascular: Distal pulses are not palpable.      ASSESSMENT AND PLAN

## 2015-05-23 NOTE — Interval H&P Note (Signed)
Cath Lab Visit (complete for each Cath Lab visit)  Clinical Evaluation Leading to the Procedure:   ACS: No.  Non-ACS:    Anginal Classification: CCS III  Anti-ischemic medical therapy: Maximal Therapy (2 or more classes of medications)  Non-Invasive Test Results: No non-invasive testing performed  Prior CABG: Previous CABG      History and Physical Interval Note:  05/23/2015 1:07 PM  [name] [name]  has presented today for surgery, with the diagnosis of Shortness of Breath, CAD  The various methods of treatment have been discussed with the patient and family. After consideration of risks, benefits and other options for treatment, the patient has consented to  Procedure(s): Left Heart Cath and Coronary Angiography (N/A) as a surgical intervention .  The patient's history has been reviewed, patient examined, no change in status, stable for surgery.  I have reviewed the patient's chart and labs.  Questions were answered to the patient's satisfaction.     [name]

## 2015-05-23 NOTE — Progress Notes (Signed)
Pt's sister states that she cannot stay with pt overnight and there is no one else who can.  Dr. Clifton James notified.  Pt to be admitted for 23hr obs

## 2015-05-23 NOTE — Progress Notes (Signed)
Report called to Ena, R.N. On 2W.  Pt transported with stable VS,stable BP and dsg DI (R) groin.

## 2015-05-23 NOTE — Discharge Instructions (Signed)
Resume Metformin in 2 days.            Radial Site Care Refer to this sheet in the next few weeks. These instructions provide you with information on caring for yourself after your procedure. Your caregiver may also give you more specific instructions. Your treatment has been planned according to current medical practices, but problems sometimes occur. Call your caregiver if you have any problems or questions after your procedure. HOME CARE INSTRUCTIONS  You may shower the day after the procedure.Remove the bandage (dressing) and gently wash the site with plain soap and water.Gently pat the site dry.  Do not apply powder or lotion to the site.  Do not submerge the affected site in water for 3 to 5 days.  Inspect the site at least twice daily.  Do not flex or bend the affected arm for 24 hours.  No lifting over 5 pounds (2.3 kg) for 5 days after your procedure.  Do not drive home if you are discharged the same day of the procedure. Have someone else drive you.  You may drive 24 hours after the procedure unless otherwise instructed by your caregiver.  Do not operate machinery or power tools for 24 hours.  A responsible adult should be with you for the first 24 hours after you arrive home. What to expect:  Any bruising will usually fade within 1 to 2 weeks.  Blood that collects in the tissue (hematoma) may be painful to the touch. It should usually decrease in size and tenderness within 1 to 2 weeks. SEEK IMMEDIATE MEDICAL CARE IF:  You have unusual pain at the radial site.  You have redness, warmth, swelling, or pain at the radial site.  You have drainage (other than a small amount of blood on the dressing).  You have chills.  You have a fever or persistent symptoms for more than 72 hours.  You have a fever and your symptoms suddenly get worse.  Your arm becomes pale, cool, tingly, or numb.  You have heavy bleeding from the site. Hold pressure on the site. Document  Released: 09/13/2010 Document Revised: 11/03/2011 Document Reviewed: 09/13/2010 [facility] Patient Information 2015 [place], [place]. This information is not intended to replace advice given to you by your health care provider. Make sure you discuss any questions you have with your health care provider.

## 2015-05-24 ENCOUNTER — Encounter (HOSPITAL_COMMUNITY): Payer: Self-pay | Admitting: Cardiovascular Disease

## 2015-06-13 ENCOUNTER — Encounter: Payer: Self-pay | Admitting: Physician Assistant

## 2015-06-13 ENCOUNTER — Ambulatory Visit (INDEPENDENT_AMBULATORY_CARE_PROVIDER_SITE_OTHER): Payer: Medicare PPO | Admitting: Physician Assistant

## 2015-06-13 VITALS — BP 110/62 | HR 91 | Ht 72.0 in | Wt 295.5 lb

## 2015-06-13 DIAGNOSIS — I493 Ventricular premature depolarization: Secondary | ICD-10-CM

## 2015-06-13 DIAGNOSIS — Z23 Encounter for immunization: Secondary | ICD-10-CM

## 2015-06-13 DIAGNOSIS — I5021 Acute systolic (congestive) heart failure: Secondary | ICD-10-CM

## 2015-06-13 DIAGNOSIS — I2510 Atherosclerotic heart disease of native coronary artery without angina pectoris: Secondary | ICD-10-CM

## 2015-06-13 DIAGNOSIS — I255 Ischemic cardiomyopathy: Secondary | ICD-10-CM | POA: Insufficient documentation

## 2015-06-13 DIAGNOSIS — I5022 Chronic systolic (congestive) heart failure: Secondary | ICD-10-CM

## 2015-06-13 DIAGNOSIS — I10 Essential (primary) hypertension: Secondary | ICD-10-CM

## 2015-06-13 DIAGNOSIS — I25118 Atherosclerotic heart disease of native coronary artery with other forms of angina pectoris: Secondary | ICD-10-CM | POA: Diagnosis not present

## 2015-06-13 DIAGNOSIS — E785 Hyperlipidemia, unspecified: Secondary | ICD-10-CM

## 2015-06-13 NOTE — Progress Notes (Signed)
Cardiology Office Note:  Date of Encounter: 06/13/2015  ID: [name] [name], DOB Jan 22, 1941, MRN [id number]  PCP:  [name], MD Primary Cardiologist:  Dr. Kirke Corin, MD  Chief Complaint  Patient presents with  . other    F/u cardiac cath. Meds reveiwed verbally with pt.    HPI:  74 year old male with history of CAD s/p CABG, ischemic cardiomyopathy/chronic systolci CHF, AAA, HTN, HLD, DM2, and mild to moderate MR who presents for recent cardiac cath, in the setting of increased exertional dyspnea, follow up in which medical therapy was recommended.   He was admitted to [facility] in August, 2015 with chest pain and ruled in for NSTEMI. Echo showed severe LV dysfxn and cath revealed severe 3VD. He underwent 5 vessel CABG on June 06, 2014, by Dr. Donata Clay with LIMA to diagonal, SVG to LAD, SVG to OM1, SVG to OM 2, and SVG to right PDA. Intraoperative TEE showed only mild MR with improvement of ejection fraction to 40-45%. Postoperative course was remarkable for atrial fibrillation which was treated with amiodarone. Repeat echo in May 2016 showed an EF of 40-45% with no significant valvular abnormalities. Fluid overload improved with Lasix.   He was seen in clinic recently with worsening exertional dyspnea very similar to his presentation last year. He was diuresed. His labs showed some volume depletion and BNP was not significantly high. An echocardiogram was done which showed a drop in his LV systolic function with an ejection fraction of 30-35%. He reported no chest pain. However, even when he presented with non-ST elevation myocardial infarction he did not have any chest discomfort at that time. His angina was dyspnea. In follow up on 9/20 he continued to have significant worsening of exertional dyspnea without evidence of fluid overload with associated drop in his EF. These symptoms were similar to his prior MI. Because of this he underwent diagnostic cardiac cath on 9/28 that showed  severe underlying 3 vessel disease with 3 of 5 patent graft. Occluded SVG to OM1 and SVG to OM2. There was diffuse disease in the proximal and mid LCx into the origin of the OM branches which made it not a good option for PCI. There was moderately to severely reduced LV systolic function with an EF of 30-35%. Mildly elevated LVEDP. It was recommended he continue medical therapy at this time given the LCx teritory was overall not large and the vessel was diffusely diseased. He was also noted to have frequent PVCs which may have been contributing to his cardiomyopathy. It was recommended he undergo a 24 hour Holter monitor to quantify.   His SOB is much better since restarting Lasix 40 mg daily. He has been walking at [employer] daily without any symptoms. He just recently trimmed all of the hedges at his house without any chest pain or SOB. He does try to limit the amount of fluids he drinks and he reports his wife does not add any extra salt to their foods. He is pleased with his progress since he was last seen. He does not feel any palpitations and never has. He recently had follow up AAA [place] and reports improved/stable size. He is tolerating all of his medications without issues.       Past Medical History  Diagnosis Date  . Hypertension   . Hyperlipidemia   . Gout   . Diabetes mellitus without complication (HCC)   . Nephrolithiasis   . Umbilical hernia   . Epigastric hernia   .  Obesity   . Chronic systolic CHF (congestive heart failure) (HCC)     a. EF 25-30% by 2D ECHO 04/20/14. Severely dec global hypokinesis. mildly elevated RVSP;  b. 12/2014 Echo: EF 40-45%, mod dil LA, mildly dil RV/RA.  [name] AAA (abdominal aortic aneurysm) (HCC)   . Ischemic cardiomyopathy     a. EF 25-30% 03/2014;  b. EF 40-45% 12/2014.  . Mitral regurgitation     a. 03/2014 TEE: mild to mod MR.  . Myocardial infarction (HCC)   . Neuromuscular disorder (HCC)     DIABETIC NEUROPATHY  . Arthritis   . CAD (coronary artery  disease)     a. 03/2014 NSTEMI/Cath: LM nl, LAD 67m - hazy, D1 90p, D2 100, D3 min irregs, LCX 45m, OM2 95, OM3 90 small, RCA 51m;  b. 05/2014 CABG x 5 (LIMA->D1, VG->LAD, VG->OM1, VG->OM2, VG->RPDA).  :  Past Surgical History  Procedure Laterality Date  . Rotator cuff repair Right 1996,1997,2000  . Carpal tunnel release Right   . Tee without cardioversion N/A 04/24/2014    Procedure: TRANSESOPHAGEAL ECHOCARDIOGRAM (TEE);  Surgeon: [name], MD;  Location: [facility] ENDOSCOPY;  Service: Cardiovascular;  Laterality: N/A;  . Cardiac catheterization      [facility]  . Coronary artery bypass graft N/A 06/06/2014    Procedure: CORONARY ARTERY BYPASS GRAFTING (CABG) x 5 USING LEFT AND RIGHT SAPHENOUS LEG VEIN HARVESTED ENDOSCOPICALLY;  Surgeon: Kerin Perna, MD;  Location: [facility] OR;  Service: Open Heart Surgery;  Laterality: N/A;  . Intraoperative transesophageal echocardiogram N/A 06/06/2014    Procedure: INTRAOPERATIVE TRANSESOPHAGEAL ECHOCARDIOGRAM;  Surgeon: Kerin Perna, MD;  Location: [facility] OR;  Service: Open Heart Surgery;  Laterality: N/A;  . Cardiac catheterization N/A 05/23/2015    Procedure: Left Heart Cath and Coronary Angiography;  Surgeon: Iran Ouch, MD;  Location: [facility] INVASIVE CV LAB;  Service: Cardiovascular;  Laterality: N/A;  :  Social History:  The patient  reports that he has quit smoking. His smoking use included Cigarettes. He has a 25 pack-year smoking history. He has never used smokeless tobacco. He reports that he does not drink alcohol or use illicit drugs.   Family History  Problem Relation Age of Onset  . Colon polyps Brother   . Lung cancer Brother   . Throat cancer Father   . Stroke Father      Allergies:  No Known Allergies   Home Medications:  Current Outpatient Prescriptions  Medication Sig Dispense Refill  . aspirin EC 325 MG EC tablet Take 1 tablet (325 mg total) by mouth daily. 30 tablet 0  . atorvastatin (LIPITOR) 40 MG tablet Take 1 tablet (40 mg  total) by mouth daily. 90 tablet 6  . carvedilol (COREG) 6.25 MG tablet Take 1 tablet (6.25 mg total) by mouth 2 (two) times daily. 60 tablet 6  . colchicine 0.6 MG tablet Take 0.6 mg by mouth daily as needed (for gout flareup).     . furosemide (LASIX) 40 MG tablet Take 1 tablet (40 mg total) by mouth daily. 30 tablet 6  . gabapentin (NEURONTIN) 300 MG capsule Take 300 mg by mouth 4 (four) times daily.     [name] lisinopril (PRINIVIL,ZESTRIL) 20 MG tablet Take 1 tablet (20 mg total) by mouth every morning. 90 tablet 3  . metFORMIN (GLUCOPHAGE) 1000 MG tablet Take 1 tablet (1,000 mg total) by mouth 2 (two) times daily with a meal. 60 tablet 11  . Misc Natural Products (BLACK CHERRY CONCENTRATE PO) Take  1 capsule by mouth daily.     No current facility-administered medications for this visit.     Review of Systems:  Review of Systems  Constitutional: Negative for fever, chills, weight loss, malaise/fatigue and diaphoresis.  HENT: Negative for congestion.   Eyes: Negative for discharge and redness.  Respiratory: Negative for cough, hemoptysis, sputum production, shortness of breath and wheezing.   Cardiovascular: Negative for chest pain, palpitations, orthopnea, claudication, leg swelling and PND.  Gastrointestinal: Negative for heartburn, nausea and vomiting.  Musculoskeletal: Negative for falls.  Skin: Negative for rash.  Neurological: Negative for dizziness, tremors, sensory change, speech change, focal weakness, loss of consciousness and weakness.  Endo/Heme/Allergies: Does not bruise/bleed easily.  Psychiatric/Behavioral: The patient is not nervous/anxious.      Physical Exam:  Blood pressure 110/62, pulse 91, height 6' (1.829 m), weight 295 lb 8 oz (134.038 kg). BMI: Body mass index is 40.07 kg/(m^2). General: Pleasant, NAD. Psych: Normal affect. Responds to questions with normal affect.  Neuro: Alert and oriented X 3. Moves all extremities spontaneously. HEENT: Normocephalic,  atraumatic. EOM intact. Sclera anicteric.  Neck: Trachea midline. Supple without bruits or JVD. Lungs:  Respirations regular and unlabored. CTA bilaterally without wheezing, crackles, or rhonchi.  Heart: Irregular, normal s3, s4. No murmurs, rubs, or gallops.  Abdomen: Obese, soft, non-tender, non-distended, BS + x 4.  Extremities: No clubbing, cyanosis or edema. DP/PT/Radials 2+ and equal bilaterally.   Accessory Clinical Findings:  EKG: NSR with frequent PVCs in a pattern of ventricular bigeminy, 91 bpm, TWI aVL  Recent Labs: 08/10/2014: ALT 20 12/25/2014: Magnesium 2.0; TSH 4.710* 05/08/2015: BNP 119.6* 05/15/2015: BUN 21; Creatinine, Ser 1.16; Potassium 4.8; Sodium 142  12/25/2014: Chol/HDL Ratio 3.8; Cholesterol, Total 95*; HDL 25*; LDL Calculated 44; Triglycerides 129  CrCl cannot be calculated (Patient has no serum creatinine result on file.).  Weights: Wt Readings from Last 3 Encounters:  06/13/15 295 lb 8 oz (134.038 kg)  05/23/15 287 lb (130.182 kg)  05/15/15 293 lb (132.904 kg)    Other studies Reviewed: Additional studies/ records that were reviewed today include: prior office notes and cardiac cath.  Assessment & Plan:  1. CAD s/p CABG as above: -Recent cardiac cath as above recommending medical management -His dyspnea has greatly improved with initiation of Lasix 40 mg daily -Continue aspirin  -No angina   2. Ischemic cardiomyopathy: -Dyspnea is greatly improved as above -His weight is up today, though he does not appear volume overloaded, and is asymptomatic with exertion and at rest. Suspect this is from food intake -Continue Lasix 40 mg daily, Coreg 6.25 mg bid -Blood pressure is slightly soft precluding the change from lisinopril to Entresto at this time -Continue lisinopril 20 mg daily   3. Frequent PVCs: -Schedule 24 hour Holter monitor to quantify the frequency of PVCs as they may be contributing to his cardiomyopathy  -BP precludes initiation of further  titration of Coreg at this time, continue current dose of 6.25 mg bid  4. HLD: -Continue Lipitor 40 mg    Dispo: -Follow up 1 month with Dr. Kirke Corin, MD  Current medicines are reviewed at length with the patient today.  The patient did not have any concerns regarding medicines.   Eula Listen, PA-C [facility] [address] [place], [place] [address] [phone] [facility] 06/13/2015, 1:46 PM

## 2015-06-13 NOTE — Patient Instructions (Addendum)
Medication Instructions:  Please continue your current medications  Labwork: none  Testing/Procedures: Your physician has recommended that you wear a holter monitor. Holter monitors are medical devices that record the heart's electrical activity. Doctors most often use these monitors to diagnose arrhythmias. Arrhythmias are problems with the speed or rhythm of the heartbeat. The monitor is a small, portable device. You can wear one while you do your normal daily activities. This is usually used to diagnose what is causing palpitations/syncope (passing out).  We will give you your flu shot today  Follow-Up: 1 month w/ Dr. Kirke Corin  Any Other Special Instructions Will Be Listed Below     Holter Monitoring A Holter monitor is a small device that is used to detect abnormal heart rhythms. It clips to your clothing and is connected by wires to flat, sticky disks (electrodes) that attach to your chest. It is worn continuously for 24-48 hours. HOME CARE INSTRUCTIONS  Wear your Holter monitor at all times, even while exercising and sleeping, for as long as directed by your health care provider.  Make sure that the Holter monitor is safely clipped to your clothing or close to your body as recommended by your health care provider.  Do not get the monitor or wires wet.  Do not put body lotion or moisturizer on your chest.  Keep your skin clean.  Keep a diary of your daily activities, such as walking and doing chores. If you feel that your heartbeat is abnormal or that your heart is fluttering or skipping a beat:  Record what you are doing when it happens.  Record what time of day the symptoms occur.  Return your Holter monitor as directed by your health care provider.  Keep all follow-up visits as directed by your health care provider. This is important. SEEK IMMEDIATE MEDICAL CARE IF:  You feel lightheaded or you faint.  You have trouble breathing.  You feel pain in your chest, upper  arm, or jaw.  You feel sick to your stomach and your skin is pale, cool, or damp.  You heartbeat feels unusual or abnormal.   This information is not intended to replace advice given to you by your health care provider. Make sure you discuss any questions you have with your health care provider.   Document Released: 05/09/2004 Document Revised: 09/01/2014 Document Reviewed: 03/20/2014 Elsevier Interactive Patient Education 2016 Elsevier Inc. Premature Ventricular Contraction A premature ventricular contraction is an irregularity in the normal heart rhythm. These contractions are extra heartbeats that occur too early in the normal sequence. In most cases, these contractions are harmless and do not require treatment. CAUSES Premature ventricular contractions may occur without a known cause. In healthy people, the extra contractions may be caused by:  Smoking.  Drinking alcohol.  Caffeine.  Certain medicines.  Some illegal drugs.  Stress. Sometimes, changes in chemicals in the blood (electrolytes) can also cause premature ventricular contractions. They can also occur in people with heart diseases that cause a decrease in blood flow to the heart. SIGNS AND SYMPTOMS Premature ventricular contractions often do not cause any symptoms. In some cases, you may have a feeling of your heart beating fast or skipping a beat (palpitations). DIAGNOSIS Your health care provider will take your medical history and do a physical exam. During the exam, the health care provider will check for irregular heartbeats. Various tests may be done to help diagnose premature ventricular contractions. These tests may include:  An ECG (electrocardiogram) to monitor the electrical activity  of your heart.  Holter monitor testing. A Holter monitor is a portable device that can monitor the electrical activity of your heart over longer periods of time.  Stress tests to see how exercise affects your heart  rhythm.  Echocardiogram. This test uses sound waves (ultrasound) to produce an image of your heart.  Electrophysiology study. This is used to evaluate the electrical conduction system of your heart. TREATMENT Usually, no treatment is needed. You may be advised to avoid things that can trigger the premature contractions, such as caffeine or alcohol. Medicines are sometimes given if symptoms are severe or if the extra heartbeats are very frequent. Treatment may also be needed for an underlying cause of the contractions if one is found. HOME CARE INSTRUCTIONS  Take medicines only as directed by your health care provider.  Make any lifestyle changes recommended by your health care provider. These may include:  Quitting smoking.  Avoiding or limiting caffeine or alcohol.  Exercising. Talk to your health care provider about what type of exercise is safe for you.  Trying to reduce stress.  Keep all follow-up visits with your health care provider. This is important. SEEK IMMEDIATE MEDICAL CARE IF:  You feel palpitations that are frequent or continual.  You have chest pain.  You have shortness of breath.  You have sweating for no reason.  You have nausea and vomiting.  You become light-headed or faint.   This information is not intended to replace advice given to you by your health care provider. Make sure you discuss any questions you have with your health care provider.   Document Released: 03/28/2004 Document Revised: 09/01/2014 Document Reviewed: 01/12/2014 Elsevier Interactive Patient Education [employer]

## 2015-06-20 ENCOUNTER — Ambulatory Visit (INDEPENDENT_AMBULATORY_CARE_PROVIDER_SITE_OTHER): Payer: Medicare PPO

## 2015-06-20 DIAGNOSIS — I493 Ventricular premature depolarization: Secondary | ICD-10-CM

## 2015-07-13 ENCOUNTER — Telehealth: Payer: Self-pay

## 2015-07-13 NOTE — Telephone Encounter (Signed)
Pt partner called, would like to know why pt is seeing Dr. Graciela Husbands. Please call and advise.

## 2015-07-13 NOTE — Telephone Encounter (Signed)
S/w pt partner, Aggie Cosier, who inquires of need for appt w/Dr. Graciela Husbands. Reviewed holter monitor results and recommendations. Aggie Cosier had no further questions.

## 2015-07-16 ENCOUNTER — Encounter: Payer: Self-pay | Admitting: Physician Assistant

## 2015-07-17 ENCOUNTER — Ambulatory Visit (INDEPENDENT_AMBULATORY_CARE_PROVIDER_SITE_OTHER): Payer: Medicare PPO | Admitting: Physician Assistant

## 2015-07-17 ENCOUNTER — Encounter: Payer: Self-pay | Admitting: Physician Assistant

## 2015-07-17 VITALS — BP 130/72 | HR 86 | Ht 71.0 in | Wt 293.2 lb

## 2015-07-17 DIAGNOSIS — E669 Obesity, unspecified: Secondary | ICD-10-CM

## 2015-07-17 DIAGNOSIS — I493 Ventricular premature depolarization: Secondary | ICD-10-CM

## 2015-07-17 DIAGNOSIS — I2510 Atherosclerotic heart disease of native coronary artery without angina pectoris: Secondary | ICD-10-CM | POA: Diagnosis not present

## 2015-07-17 DIAGNOSIS — I5022 Chronic systolic (congestive) heart failure: Secondary | ICD-10-CM

## 2015-07-17 DIAGNOSIS — E785 Hyperlipidemia, unspecified: Secondary | ICD-10-CM

## 2015-07-17 DIAGNOSIS — I5021 Acute systolic (congestive) heart failure: Secondary | ICD-10-CM

## 2015-07-17 MED ORDER — MEXILETINE HCL 200 MG PO CAPS: 200.0000 mg | ORAL_CAPSULE | Freq: Two times a day (BID) | ORAL | Status: DC

## 2015-07-17 NOTE — Patient Instructions (Addendum)
Medication Instructions:  Please start mexiletine 200 mg twice daily  Labwork: BMET Magnesium  Testing/Procedures: Your physician has recommended that you wear a holter monitor. Holter monitors are medical devices that record the heart's electrical activity. Doctors most often use these monitors to diagnose arrhythmias. Arrhythmias are problems with the speed or rhythm of the heartbeat. The monitor is a small, portable device. You can wear one while you do your normal daily activities. This is usually used to diagnose what is causing palpitations/syncope (passing out).   Date & time of holter placement:  ___________________  Follow-Up: Pending results from holter  If you need a refill on your cardiac medications before your next appointment, please call your pharmacy.    Holter Monitoring A Holter monitor is a small device that is used to detect abnormal heart rhythms. It clips to your clothing and is connected by wires to flat, sticky disks (electrodes) that attach to your chest. It is worn continuously for 24-48 hours. HOME CARE INSTRUCTIONS  Wear your Holter monitor at all times, even while exercising and sleeping, for as long as directed by your health care provider.  Make sure that the Holter monitor is safely clipped to your clothing or close to your body as recommended by your health care provider.  Do not get the monitor or wires wet.  Do not put body lotion or moisturizer on your chest.  Keep your skin clean.  Keep a diary of your daily activities, such as walking and doing chores. If you feel that your heartbeat is abnormal or that your heart is fluttering or skipping a beat:  Record what you are doing when it happens.  Record what time of day the symptoms occur.  Return your Holter monitor as directed by your health care provider.  Keep all follow-up visits as directed by your health care provider. This is important. SEEK IMMEDIATE MEDICAL CARE IF:  You feel  lightheaded or you faint.  You have trouble breathing.  You feel pain in your chest, upper arm, or jaw.  You feel sick to your stomach and your skin is pale, cool, or damp.  You heartbeat feels unusual or abnormal.   This information is not intended to replace advice given to you by your health care provider. Make sure you discuss any questions you have with your health care provider.   Document Released: 05/09/2004 Document Revised: 09/01/2014 Document Reviewed: 03/20/2014 Elsevier Interactive Patient Education 2016 Elsevier Inc. Mexiletine capsules What is this medicine? MEXILETINE (mex IL e teen) is an antiarrhythmic agent. This medicine is used to treat irregular heart rhythm and can slow rapid heartbeats. It can help your heart to return to and maintain a normal rhythm. Because of the side effects caused by this medicine, it is usually used for heartbeat problems that may be life-threatening. This medicine may be used for other purposes; ask your health care provider or pharmacist if you have questions. What should I tell my health care provider before I take this medicine? They need to know if you have any of these conditions: -liver disease -other heart problems -previous heart attack -an unusual or allergic reaction to mexiletine, other medicines, foods, dyes, or preservatives -pregnant or trying to get pregnant -breast-feeding How should I use this medicine? Take this medicine by mouth with a glass of water. Follow the directions on the prescription label. It is recommended that you take this medicine with food or an antacid. Take your doses at regular intervals. Do not take your  medicine more often than directed. Do not stop taking except on the advice of your doctor or health care professional. Talk to your pediatrician regarding the use of this medicine in children. Special care may be needed. Overdosage: If you think you have taken too much of this medicine contact a poison  control center or emergency room at once. NOTE: This medicine is only for you. Do not share this medicine with others. What if I miss a dose? If you miss a dose, take it as soon as you can. If it is almost time for your next dose, take only that dose. Do not take double or extra doses. What may interact with this medicine? Do not take this medicine with any of the following medications: -dofetilide This medicine may also interact with the following medications: -caffeine -cimetidine -medicines for depression, anxiety, or psychotic disturbances -medicines to control heart rhythm -phenobarbital -phenytoin -rifampin -theophylline This list may not describe all possible interactions. Give your health care provider a list of all the medicines, herbs, non-prescription drugs, or dietary supplements you use. Also tell them if you smoke, drink alcohol, or use illegal drugs. Some items may interact with your medicine. What should I watch for while using this medicine? Your condition will be monitored closely when you first begin therapy. Often, this drug is first started in a hospital or other monitored health care setting. Once you are on maintenance therapy, visit your doctor or health care professional for regular checks on your progress. Because your condition and use of this medicine carry some risk, it is a good idea to carry an identification card, necklace or bracelet with details of your condition, medications, and doctor or health care professional. Bonita Quin may get drowsy or dizzy. Do not drive, use machinery, or do anything that needs mental alertness until you know how this medicine affects you. Do not stand or sit up quickly, especially if you are an older patient. This reduces the risk of dizzy or fainting spells. Alcohol can make you more dizzy, increase flushing and rapid heartbeats. Avoid alcoholic drinks. What side effects may I notice from receiving this medicine? Side effects that you  should report to your doctor or health care professional as soon as possible: -allergic reactions like skin rash, itching or hives, swelling of the face, lips, or tongue -breathing problems -chest pain, continued irregular heartbeats -redness, blistering, peeling or loosening of the skin, including inside the mouth -seizures -skin rash -trembling, shaking -unusual bleeding or bruising -unusually weak or tired Side effects that usually do not require medical attention (report to your doctor or health care professional if they continue or are bothersome): -blurred vision -difficulty walking -heartburn -nausea, vomiting -nervousness -numbness, or tingling in the fingers or toes This list may not describe all possible side effects. Call your doctor for medical advice about side effects. You may report side effects to FDA at 1-800-FDA-1088. Where should I keep my medicine? Keep out of reach of children. Store at room temperature between 15 and 30 degrees C (59 and 86 degrees F). Throw away any unused medicine after the expiration date. NOTE: This sheet is a summary. It may not cover all possible information. If you have questions about this medicine, talk to your doctor, pharmacist, or health care provider.    2016, Elsevier/Gold Standard. (2008-02-28 13:59:49)

## 2015-07-17 NOTE — Progress Notes (Signed)
Cardiology Office Note:  Date of Encounter: 07/17/2015  ID: [name] [name], DOB [birth year], MRN [id number]  PCP:  [name], MD Primary Cardiologist:  Dr. Kirke Corin, MD  Chief Complaint  Patient presents with  . other    1 month follow up as well discuss the holter monitor. Meds reviewed by the patient verbally. "doing well."     HPI:  74 year old male with history of CAD s/p CABG, ischemic cardiomyopathy/chronic systolic CHF, AAA, HTN, HLD, DM2, and mild to moderate MR who presents for follow up of his CAD and ischemic cardiomyopathy.   He was admitted to [facility] in August, 2015 with chest pain and ruled in for NSTEMI. Echo showed severe LV dysfxn and cath revealed severe 3VD. He underwent 5 vessel CABG on June 06, 2014, by Dr. Donata Clay with LIMA to diagonal, SVG to LAD, SVG to OM1, SVG to OM 2, and SVG to right PDA. Intraoperative TEE showed only mild MR with improvement of ejection fraction to 40-45%. Postoperative course was remarkable for atrial fibrillation which was treated with amiodarone. Repeat echo in May 2016 showed an EF of 40-45% with no significant valvular abnormalities. Fluid overload improved with Lasix.   He was seen in clinic in mid September 2016 with worsening exertional dyspnea very similar to his presentation last year. He was diuresed. His labs showed some volume depletion and BNP was not significantly high at 119. An echocardiogram was done which showed a drop in his LV systolic function with an ejection fraction of 30-35%. He reported no chest pain. However, even when he presented with non-ST elevation myocardial infarction he did not have any chest discomfort at that time. His angina was dyspnea. In follow up on 9/20 he continued to have significant worsening of exertional dyspnea without evidence of fluid overload with associated drop in his EF. These symptoms were similar to his prior MI. Because of this he underwent diagnostic cardiac cath on 9/28 that  showed severe underlying 3 vessel disease with 3 of 5 patent grafts. Occluded SVG to OM1 and SVG to OM2. There was diffuse disease in the proximal and mid LCx into the origin of the OM branches which made it not a good option for PCI. There was moderately to severely reduced LV systolic function with an EF of 30-35%. Mildly elevated LVEDP. It was recommended he continue medical therapy at that time given the LCx teritory was overall not large and the vessel was diffusely diseased. He was also noted to have frequent PVCs which may have been contributing to his cardiomyopathy. It was recommended he undergo a 24 hour Holter monitor to quantify. In follow up on 10/19 his SOB was much better since restarting Lasix 40 mg daily. He had been walking at [facility] daily without any symptoms. He does try to limit the amount of fluids he drinks and he reports his wife does not add any extra salt to their foods. He was not feeling any palpitations and never had. He recently had follow up AAA [place] and reports improved/stable size. He wore a 24 hour Holter monitor that showed frequent PVCs, >28,000, 25% of all beats. Because of this coupled with his cardiomyopathy it was recommended he follow up with EP for further evaluation and treatment.   He continues to do well since restarting Lasix 40 mg daily. Weight has been stable. He limits is po salt and fluid intake. He continues to walk at [facility] on an almost daily basis without issues. He does  not feel any palpitations. He also has not noticed any SOB. No chest pain, diaphoresis, nausea, vomiting, presyncope, or syncope. He feels like he is doing well.      Past Medical History  Diagnosis Date  . Hypertension   . Hyperlipidemia   . Gout   . Diabetes mellitus without complication (HCC)   . Nephrolithiasis   . Umbilical hernia   . Epigastric hernia   . Obesity   . Chronic systolic CHF (congestive heart failure) (HCC)     a. EF 25-30% by 2D ECHO 04/20/14. Severely dec  global hypokinesis. mildly elevated RVSP;  b. 12/2014 Echo: EF 40-45%, mod dil LA, mildly dil RV/RA.  [name] AAA (abdominal aortic aneurysm) (HCC)   . Ischemic cardiomyopathy     a. EF 25-30% 03/2014;  b. EF 40-45% 12/2014.  . Mitral regurgitation     a. 03/2014 TEE: mild to mod MR.  . Myocardial infarction (HCC)   . Neuromuscular disorder (HCC)     DIABETIC NEUROPATHY  . Arthritis   . CAD (coronary artery disease)     a. 03/2014 NSTEMI/Cath: LM nl, LAD 47m - hazy, D1 90p, D2 100, D3 min irregs, LCX 89m, OM2 95, OM3 90 small, RCA 61m;  b. 05/2014 CABG x 5 (LIMA->D1, VG->LAD, VG->OM1, VG->OM2, VG->RPDA).  . Frequent PVCs     a. 24 hr Holter 06/2015: >28K PVCs accounting for 25% of all beats, rare PAC  :  Past Surgical History  Procedure Laterality Date  . Rotator cuff repair Right 1996,1997,2000  . Carpal tunnel release Right   . Tee without cardioversion N/A 04/24/2014    Procedure: TRANSESOPHAGEAL ECHOCARDIOGRAM (TEE);  Surgeon: [name], MD;  Location: [facility] ENDOSCOPY;  Service: Cardiovascular;  Laterality: N/A;  . Cardiac catheterization      [facility]  . Coronary artery bypass graft N/A 06/06/2014    Procedure: CORONARY ARTERY BYPASS GRAFTING (CABG) x 5 USING LEFT AND RIGHT SAPHENOUS LEG VEIN HARVESTED ENDOSCOPICALLY;  Surgeon: Kerin Perna, MD;  Location: [facility] OR;  Service: Open Heart Surgery;  Laterality: N/A;  . Intraoperative transesophageal echocardiogram N/A 06/06/2014    Procedure: INTRAOPERATIVE TRANSESOPHAGEAL ECHOCARDIOGRAM;  Surgeon: Kerin Perna, MD;  Location[facility] OR;  Service: Open Heart Surgery;  Laterality: N/A;  . Cardiac catheterization N/A 05/23/2015    Procedure: Left Heart Cath and Coronary Angiography;  Surgeon: Iran Ouch, MD;  Location: [facility] INVASIVE CV LAB;  Service: Cardiovascular;  Laterality: N/A;  :  Social History:  The patient  reports that he has quit smoking. His smoking use included Cigarettes. He has a 25 pack-year smoking history. He has never used  smokeless tobacco. He reports that he does not drink alcohol or use illicit drugs.   Family History  Problem Relation Age of Onset  . Colon polyps Brother   . Lung cancer Brother   . Throat cancer Father   . Stroke Father      Allergies:  No Known Allergies   Home Medications:  Current Outpatient Prescriptions  Medication Sig Dispense Refill  . aspirin EC 325 MG EC tablet Take 1 tablet (325 mg total) by mouth daily. 30 tablet 0  . atorvastatin (LIPITOR) 40 MG tablet Take 1 tablet (40 mg total) by mouth daily. 90 tablet 6  . carvedilol (COREG) 6.25 MG tablet Take 1 tablet (6.25 mg total) by mouth 2 (two) times daily. 60 tablet 6  . colchicine 0.6 MG tablet Take 0.6 mg by mouth daily as needed (for gout  flareup).     . furosemide (LASIX) 40 MG tablet Take 1 tablet (40 mg total) by mouth daily. 30 tablet 6  . gabapentin (NEURONTIN) 300 MG capsule Take 300 mg by mouth 4 (four) times daily.     [name] lisinopril (PRINIVIL,ZESTRIL) 20 MG tablet Take 1 tablet (20 mg total) by mouth every morning. 90 tablet 3  . metFORMIN (GLUCOPHAGE) 1000 MG tablet Take 1 tablet (1,000 mg total) by mouth 2 (two) times daily with a meal. 60 tablet 11  . Misc Natural Products (BLACK CHERRY CONCENTRATE PO) Take 1 capsule by mouth daily.     No current facility-administered medications for this visit.     Review of Systems:  Review of Systems  Constitutional: Negative for fever, chills, weight loss, malaise/fatigue and diaphoresis.  HENT: Negative for congestion.   Eyes: Negative for discharge and redness.  Respiratory: Negative for cough, hemoptysis, sputum production, shortness of breath and wheezing.   Cardiovascular: Negative for chest pain, palpitations, orthopnea, claudication, leg swelling and PND.  Gastrointestinal: Negative for heartburn, nausea, vomiting and abdominal pain.  Musculoskeletal: Negative for falls.  Skin: Negative for rash.  Neurological: Negative for dizziness, tingling, tremors,  sensory change, speech change, focal weakness, loss of consciousness and weakness.  Endo/Heme/Allergies: Does not bruise/bleed easily.  Psychiatric/Behavioral: Negative for substance abuse. The patient is not nervous/anxious.   All other systems reviewed and are negative.    Physical Exam:  Blood pressure 130/72, pulse 86, height  (1.803 m), weight 293 lb 4 oz (133.017 kg). BMI: Body mass index is 40.92 kg/(m^2). General: Pleasant, NAD. Psych: Normal affect. Responds to questions with normal affect.  Neuro: Alert and oriented X 3. Moves all extremities spontaneously. HEENT: Normocephalic, atraumatic. EOM intact. Sclera anicteric.  Neck: Trachea midline. Supple without bruits or JVD. Lungs:  Respirations regular and unlabored. CTA bilaterally without wheezing, crackles, or rhonchi.  Heart: RRR, normal s3, s4. No murmurs, rubs, or gallops.  Abdomen: Obese, soft, non-tender, non-distended, BS + x 4.  Extremities: No clubbing or cyanosis. Trace pre-tibial edema. DP/PT/Radials 2+ and equal bilaterally.   Accessory Clinical Findings:  EKG: NSR, 86 bpm, occasional PVC with RBBB morphology, no significant st/t changes   Recent Labs: 08/10/2014: ALT 20 12/25/2014: Magnesium 2.0; TSH 4.710* 05/08/2015: BNP 119.6* 05/15/2015: BUN 21; Creatinine, Ser 1.16; Potassium 4.8; Sodium 142  12/25/2014: Chol/HDL Ratio 3.8; Cholesterol, Total 95*; HDL 25*; LDL Calculated 44; Triglycerides 129  CrCl cannot be calculated (Patient has no serum creatinine result on file.).  Weights: Wt Readings from Last 3 Encounters:  07/17/15 293 lb 4 oz (133.017 kg)  06/13/15 295 lb 8 oz (134.038 kg)  05/23/15 287 lb (130.182 kg)    Other studies Reviewed: Additional studies/ records that were reviewed today include: prior clinic notes.  Assessment & Plan:  1. Frequent PVCs:  -Abnormal ECG, PVCs are of RBBB meaning they are coming from left ventricle  -Would look to decrease frequency with mexiletine 200 mg  bid -Plan for repeat 24 hour Holter monitor in a couple weeks, if this demonstrates decreased PVCs would then plan for a repeat echocardiogram to assess for possible improved LV function  -Continue Coreg 6.25 mg bid -Less desirable option for medical management is amiodarone given his age -Will plan for patient to also be seen by EP for continued medical therapy and possible ablation if needed at that time    2. CAD s/p CABG:  -No angina -Continue aspirin, Coreg 6.25 mg bid, Lipitor 40 mg, and lisinopril  20 mg   3. 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 If repeat 24 hour Holter monitor demonstrates decreases ventricular ectopy will plan for repeat echocardiogram to assess for improved LV function  -Consider adding spironolactone in the future  4. HLD:  -Continue Lipitor 40 mg   5. Obesity:  -Weight loss advised  --Discussed with EP  Dispo: -Follow up after Holter pending results -Check bmet and Mg++  Current medicines are reviewed at length with the patient today.  The patient did not have any concerns regarding medicines.   Eula Listen, PA-C [facility] [address] [place], [place] [address] [phone] [facility] 07/17/2015, 1:34 PM

## 2015-07-18 LAB — BASIC METABOLIC PANEL
BUN / CREAT RATIO: 19 (ref 10–22)
BUN: 29 mg/dL — AB (ref 8–27)
CO2: 25 mmol/L (ref 18–29)
CREATININE: 1.51 mg/dL — AB (ref 0.76–1.27)
Calcium: 10.1 mg/dL (ref 8.6–10.2)
Chloride: 99 mmol/L (ref 97–106)
GFR, EST AFRICAN AMERICAN: 53 mL/min/{1.73_m2} — AB (ref >59)
GFR, EST NON AFRICAN AMERICAN: 45 mL/min/{1.73_m2} — AB (ref >59)
Glucose: 94 mg/dL (ref 65–99)
Potassium: 6 mmol/L — ABNORMAL HIGH (ref 3.5–5.2)
Sodium: 141 mmol/L (ref 136–144)

## 2015-07-18 LAB — MAGNESIUM: MAGNESIUM: 2.2 mg/dL (ref 1.6–2.3)

## 2015-07-23 ENCOUNTER — Ambulatory Visit: Payer: Medicare PPO | Admitting: Cardiovascular Disease

## 2015-07-26 ENCOUNTER — Ambulatory Visit (INDEPENDENT_AMBULATORY_CARE_PROVIDER_SITE_OTHER): Payer: Medicare PPO

## 2015-07-26 ENCOUNTER — Ambulatory Visit
Admission: RE | Admit: 2015-07-26 | Discharge: 2015-07-26 | Disposition: A | Discharge status: Home / Self Care | Payer: Medicare PPO | Source: Ambulatory Visit | Attending: Cardiovascular Disease | Admitting: Cardiovascular Disease

## 2015-07-26 DIAGNOSIS — I714 Abdominal aortic aneurysm, without rupture: Secondary | ICD-10-CM | POA: Insufficient documentation

## 2015-07-26 DIAGNOSIS — I493 Ventricular premature depolarization: Secondary | ICD-10-CM

## 2015-07-26 DIAGNOSIS — I2510 Atherosclerotic heart disease of native coronary artery without angina pectoris: Secondary | ICD-10-CM

## 2015-07-26 DIAGNOSIS — I5021 Acute systolic (congestive) heart failure: Secondary | ICD-10-CM

## 2015-07-26 DIAGNOSIS — E785 Hyperlipidemia, unspecified: Secondary | ICD-10-CM | POA: Diagnosis not present

## 2015-07-26 DIAGNOSIS — I10 Essential (primary) hypertension: Secondary | ICD-10-CM | POA: Insufficient documentation

## 2015-07-30 ENCOUNTER — Ambulatory Visit: Payer: Medicare PPO | Admitting: Cardiovascular Disease

## 2015-08-06 ENCOUNTER — Other Ambulatory Visit: Payer: Medicare PPO

## 2015-08-06 DIAGNOSIS — I509 Heart failure, unspecified: Secondary | ICD-10-CM

## 2015-08-08 ENCOUNTER — Other Ambulatory Visit: Payer: Self-pay

## 2015-08-08 ENCOUNTER — Ambulatory Visit (INDEPENDENT_AMBULATORY_CARE_PROVIDER_SITE_OTHER): Payer: Medicare PPO

## 2015-08-08 DIAGNOSIS — I509 Heart failure, unspecified: Secondary | ICD-10-CM

## 2015-08-09 ENCOUNTER — Other Ambulatory Visit: Payer: Self-pay

## 2015-08-09 DIAGNOSIS — I509 Heart failure, unspecified: Secondary | ICD-10-CM

## 2015-08-09 DIAGNOSIS — I159 Secondary hypertension, unspecified: Secondary | ICD-10-CM

## 2015-08-13 ENCOUNTER — Other Ambulatory Visit (INDEPENDENT_AMBULATORY_CARE_PROVIDER_SITE_OTHER): Payer: Medicare PPO | Admitting: *Deleted

## 2015-08-13 DIAGNOSIS — I159 Secondary hypertension, unspecified: Secondary | ICD-10-CM | POA: Diagnosis not present

## 2015-08-13 DIAGNOSIS — I509 Heart failure, unspecified: Secondary | ICD-10-CM

## 2015-08-14 LAB — BASIC METABOLIC PANEL
BUN / CREAT RATIO: 24 — AB (ref 10–22)
BUN: 33 mg/dL — ABNORMAL HIGH (ref 8–27)
CO2: 23 mmol/L (ref 18–29)
CREATININE: 1.39 mg/dL — AB (ref 0.76–1.27)
Calcium: 9.3 mg/dL (ref 8.6–10.2)
Chloride: 101 mmol/L (ref 96–106)
GFR, EST AFRICAN AMERICAN: 58 mL/min/{1.73_m2} — AB (ref >59)
GFR, EST NON AFRICAN AMERICAN: 50 mL/min/{1.73_m2} — AB (ref >59)
Glucose: 179 mg/dL — ABNORMAL HIGH (ref 65–99)
POTASSIUM: 5.5 mmol/L — AB (ref 3.5–5.2)
SODIUM: 142 mmol/L (ref 134–144)

## 2015-08-15 ENCOUNTER — Other Ambulatory Visit: Payer: Self-pay

## 2015-08-15 ENCOUNTER — Telehealth: Payer: Self-pay

## 2015-08-15 DIAGNOSIS — E875 Hyperkalemia: Secondary | ICD-10-CM

## 2015-08-15 NOTE — Telephone Encounter (Signed)
Pt partner, Oscar La, called, would like to discuss medications that was dropped, (conversation that was had this morning with the nurse) please call.

## 2015-08-15 NOTE — Telephone Encounter (Signed)
S/w pt partner, Rosey Bath, who states pt is unsure of medication to hold. We called him today w/lab results. Reviewed instructions to hold lisinopril until 12/29 OV. Rosey Bath verbalized understanding with no further questions.

## 2015-08-23 ENCOUNTER — Encounter: Payer: Self-pay | Admitting: Nurse Practitioner

## 2015-08-23 ENCOUNTER — Ambulatory Visit (INDEPENDENT_AMBULATORY_CARE_PROVIDER_SITE_OTHER): Payer: Medicare PPO | Admitting: Nurse Practitioner

## 2015-08-23 VITALS — BP 128/70 | HR 76 | Ht 72.0 in | Wt 293.0 lb

## 2015-08-23 DIAGNOSIS — I255 Ischemic cardiomyopathy: Secondary | ICD-10-CM

## 2015-08-23 DIAGNOSIS — E785 Hyperlipidemia, unspecified: Secondary | ICD-10-CM

## 2015-08-23 DIAGNOSIS — I5042 Chronic combined systolic (congestive) and diastolic (congestive) heart failure: Secondary | ICD-10-CM | POA: Diagnosis not present

## 2015-08-23 DIAGNOSIS — I110 Hypertensive heart disease with heart failure: Secondary | ICD-10-CM

## 2015-08-23 DIAGNOSIS — K449 Diaphragmatic hernia without obstruction or gangrene: Secondary | ICD-10-CM

## 2015-08-23 DIAGNOSIS — I25810 Atherosclerosis of coronary artery bypass graft(s) without angina pectoris: Secondary | ICD-10-CM | POA: Diagnosis not present

## 2015-08-23 DIAGNOSIS — I493 Ventricular premature depolarization: Secondary | ICD-10-CM

## 2015-08-23 MED ORDER — CARVEDILOL 12.5 MG PO TABS: 12.5000 mg | ORAL_TABLET | Freq: Two times a day (BID) | ORAL | Status: DC

## 2015-08-23 NOTE — Progress Notes (Signed)
Office Visit    Patient Name: [name] [name] Date of Encounter: 08/23/2015  Primary Care Provider:  [name], MD Primary Cardiologist:  Judie Petit. Kirke Corin, MD    Chief Complaint    74 year old male with history of CAD and ischemic cardiopathy presents for follow-up related to increased burden of PVCs and mexiletine therapy.  Past Medical History    Past Medical History  Diagnosis Date  . Hypertension   . Hyperlipidemia   . Gout   . Diabetes mellitus without complication (HCC)   . Nephrolithiasis   . Umbilical hernia   . Epigastric hernia   . Obesity   . Chronic systolic CHF (congestive heart failure) (HCC)     a. EF 25-30% by 2D ECHO 04/20/14. Severely dec global hypokinesis. mildly elevated RVSP;  b. 12/2014 Echo: EF 40-45%, mod dil LA, mildly dil RV/RA;  c. 04/2015 Echo: EF 30-35%, sev antsept HK, Gr1 DD, mildly dil RA/LA;  d. 07/2015 Echo: EF 35-40%, ant/antsept HK, Gr1 DD, mildly red RV fxn.  [name] AAA (abdominal aortic aneurysm) (HCC)   . Ischemic cardiomyopathy     a. EF 25-30% 03/2014;  b. EF 40-45% 12/2014.  . Mitral regurgitation     a. 03/2014 TEE: mild to mod MR.  [name] Neuromuscular disorder (HCC)     DIABETIC NEUROPATHY  . Arthritis   . CAD (coronary artery disease)     a. 03/2014 NSTEMI--> 05/2014 CABG x 5 (LIMA->D1, VG->LAD, VG->OM1, VG->OM2, VG->RPDA);  c. 04/2015 Cath: LM nl, LAD 100ost, 40d, RI 80, LCX 80p/m, OM1 70, OM2 80, RCA 20p, 34m, 90d, VG->dLAD min irregs, VG->OM1 100, VG->OM2 100, VG->RPDA nl, LIMA->D2 nl.  . Frequent PVCs     a. 24 hr Holter 06/2015: >28K PVCs accounting for 25% of all beats, rare PAC-->mexilitene started;  b. 07/2015 Holter: 1700 PVC's/24 hrs.   Past Surgical History  Procedure Laterality Date  . Rotator cuff repair Right 1996,1997,2000  . Carpal tunnel release Right   . Tee without cardioversion N/A 04/24/2014    Procedure: TRANSESOPHAGEAL ECHOCARDIOGRAM (TEE);  Surgeon: [name], MD;  Location: [facility] ENDOSCOPY;  Service:  Cardiovascular;  Laterality: N/A;  . Cardiac catheterization      [facility]  . Coronary artery bypass graft N/A 06/06/2014    Procedure: CORONARY ARTERY BYPASS GRAFTING (CABG) x 5 USING LEFT AND RIGHT SAPHENOUS LEG VEIN HARVESTED ENDOSCOPICALLY;  Surgeon: Kerin Perna, MD;  Location: [facility] OR;  Service: Open Heart Surgery;  Laterality: N/A;  . Intraoperative transesophageal echocardiogram N/A 06/06/2014    Procedure: INTRAOPERATIVE TRANSESOPHAGEAL ECHOCARDIOGRAM;  Surgeon: Kerin Perna, MD;  Location: [facility] OR;  Service: Open Heart Surgery;  Laterality: N/A;  . Cardiac catheterization N/A 05/23/2015    Procedure: Left Heart Cath and Coronary Angiography;  Surgeon: Iran Ouch, MD;  Location: [facility] INVASIVE CV LAB;  Service: Cardiovascular;  Laterality: N/A;    Allergies  No Known Allergies  History of Present Illness    74 year old male with the above complex problem list.  In 03/2014 he suffered a NSTEMI and presented with severe dyspnea and volume overload. He did not have chest pain/pressure upon presentation. He was aggressively diuresed and subsequently underwent cath revealing severe multivessel dzs. Following clinical improvement, he f/u with thoracic surgery as an outpt and later underwent CABG x 5 in 05/2014. EF pre-CABG was 25-30%. F/U echo in 12/2014 showed EF of 40-45%. In September 2016, he complained of worsening dyspnea and follow-up echo showed reduction in LV function to  30-35%. He was not significantly volume overloaded. He underwent repeat catheterization revealing 3 of 5 patent grafts with new occlusions of the vein grafts to the OM1 and OM 2. The native obtuse marginals were small and not felt to be amenable to PCI. Medical therapy was recommended. He was noted during catheterization have frequent PVCs and subsequent or a Holter monitor which revealed 28,000 PVCs in 24 hours, accounting for 25% of all of his beats. It was felt that PVC burden may be contracting to his cardio  myopathy and he was subsequently placed on mexiletine therapy. Follow-up Holter monitoring showed significant improvement in PVC burden, now down to 1724 hours. Follow-up echocardiography showed modestly improved LV function, now with an EF of 35-40%.  Patient says that since starting mexiletine, he has noted worsening symptoms related to his hiatal hernia. This presents as both difficulty swallowing as well as indigestion. All symptoms around eating food. He would like to discontinue mexiletine to see if symptoms improve. He does apparently have a history of hiatal hernia though is aware that mexiletine may cause dyspepsia. Otherwise, he has been doing reasonably well. He has been walking most days of the week without chest pain or dyspnea. His biggest limitation is arthritis in his hips. His weight has been stable at home and he denies PND, orthopnea, dizziness, syncope, edema, or early satiety.  Home Medications    Prior to Admission medications   Medication Sig Start Date End Date Taking? Authorizing Provider  aspirin EC 325 MG EC tablet Take 1 tablet (325 mg total) by mouth daily. 06/14/14  Yes Donielle Margaretann Loveless, PA-C  atorvastatin (LIPITOR) 40 MG tablet Take 1 tablet (40 mg total) by mouth daily. 06/26/14  Yes Iran Ouch, MD  carvedilol (COREG) 12.5 MG tablet Take 1 tablet (12.5 mg total) by mouth 2 (two) times daily. 08/23/15  Yes Ok Anis, NP  colchicine 0.6 MG tablet Take 0.6 mg by mouth daily as needed (for gout flareup).    Yes Historical Provider, MD  furosemide (LASIX) 40 MG tablet Take 1 tablet (40 mg total) by mouth daily. 12/25/14  Yes Iran Ouch, MD  gabapentin (NEURONTIN) 300 MG capsule Take 300 mg by mouth 4 (four) times daily.  01/02/14  Yes Historical Provider, MD  glimepiride (AMARYL) 2 MG tablet Take 2 mg by mouth 2 (two) times daily.   Yes Historical Provider, MD  mexiletine (MEXITIL) 200 MG capsule Take 1 capsule (200 mg total) by mouth 2 (two) times  daily. 07/17/15  Yes Ryan M Dunn, PA-C  Misc Natural Products (BLACK CHERRY CONCENTRATE PO) Take 1 capsule by mouth daily.   Yes Historical Provider, MD    Review of Systems    He has been experiencing indigestion as well as difficulty swallowing ever since starting mexiletine. His otherwise been doing recently well.  He denies chest pain, palpitations, dyspnea, pnd, orthopnea, n, v, dizziness, syncope, edema, weight gain, or early satiety.  All other systems reviewed and are otherwise negative except as noted above.  Physical Exam    VS:  BP 128/70 mmHg  Pulse 76  Ht 6' (1.829 m)  Wt 293 lb (132.904 kg)  BMI 39.73 kg/m2 , BMI Body mass index is 39.73 kg/(m^2). GEN: obese, well developed, in no acute distress. HEENT: normal. Neck: Supple, obese, difficult to gauge JVP, no carotid bruits, or masses. Cardiac: RRR, no murmurs, rubs, or gallops. No clubbing, cyanosis, trace bilat LE edema.  Radials/DP/PT 1+ and equal bilaterally.  Respiratory:  Respirations regular and unlabored, clear to auscultation bilaterally. GI: Soft, nontender, nondistended, BS + x 4. MS: no deformity or atrophy. Skin: warm and dry, no rash. Neuro:  Strength and sensation are intact. Psych: Normal affect.  Accessory Clinical Findings    ECG - regular sinus rhythm, 76, leftward axis, no acute ST or T changes.  Assessment & Plan    1.  Coronary artery disease involving coronary artery bypass grafts: Status post recent catheterization revealing 3 of 5 patent grafts with occlusions of the vein grafts to the OM1 and OM 2. Native obtuse marginals were felt to be too small for intervention and he has been managed medically. He has done well without chest pain or dyspnea. He remains on aspirin, statin, beta blocker therapy.  2. Ischemic cardiopathy/chronic combined systolic and diastolic congestive heart failure: He has been weighing himself at home and notes stability. He has been walking most days of the week without  significant dyspnea. He remains on beta blocker and Lasix therapy. Lisinopril was recently discontinued in the setting of rising creatinine and also hyperkalemia. He did have follow-up basic metabolic panel on December 19 showing creatinine of 1.39, which was down from November 22. Potassium was mildly elevated at 5.5. EF improved slightly to 235-40% with reduction in PVC burden on mexiletine. Unfortunately, he does not appear to be tolerating mexiletine and this will be discontinued and carvedilol increased to 12.5 twice a day.  3. Hypertensive heart disease: Blood pressure stable at 128/70 on beta blocker therapy.  4. Hyperlipidemia: LDL of 44 in May of this year. Continue statin therapy.  5. Frequent PVCs: This was noted on Holter monitoring with significant improvement in PVC burden with the initiation of mexiletine therapy. Unfortunately, he has had significant indigestion and dysphagia since starting mexiletine. He would like to come off of this to see if symptoms improve. He will hold mexiletine and in so doing, I will increase his Coreg to 12.5 mg twice a day. Of note, he was not previously experiencing symptomatic ventricular tachycardia. If symptoms resolve off of mexiletine with return of high PVC burden, we can consider an alternate antiarrhythmic. He does have follow-up with electrophysiology in January. He will contact us by phone if symptoms improve within the next week.  6. Morbid obesity: Patient has been walking on a regular basis. He has not been losing weight though his weight has been stable.  7. Disposition: Patient is going to hold mexiletine to see if indigestion and dysphagia improved. He will follow-up with electrophysiology in January.   Nicolasa Ducking, NP 08/23/2015, 9:24 AM

## 2015-08-23 NOTE — Patient Instructions (Addendum)
Medication Instructions:  Please increase your Coreg to 12.5 mg twice daily Please call if you decide to discontinue your mexiletine permanently  Labwork: None  Testing/Procedures: None  Follow-Up: As scheduled w/ Dr. Graciela Husbands  If you need a refill on your cardiac medications before your next appointment, please call your pharmacy.

## 2015-08-30 ENCOUNTER — Other Ambulatory Visit: Payer: Self-pay | Admitting: Cardiovascular Disease

## 2015-08-31 DIAGNOSIS — M25572 Pain in left ankle and joints of left foot: Secondary | ICD-10-CM | POA: Diagnosis not present

## 2015-08-31 DIAGNOSIS — S93422A Sprain of deltoid ligament of left ankle, initial encounter: Secondary | ICD-10-CM | POA: Diagnosis not present

## 2015-08-31 DIAGNOSIS — M7989 Other specified soft tissue disorders: Secondary | ICD-10-CM | POA: Diagnosis not present

## 2015-09-07 ENCOUNTER — Other Ambulatory Visit: Payer: Self-pay | Admitting: Cardiovascular Disease

## 2015-09-19 DIAGNOSIS — H52223 Regular astigmatism, bilateral: Secondary | ICD-10-CM | POA: Diagnosis not present

## 2015-09-19 DIAGNOSIS — H524 Presbyopia: Secondary | ICD-10-CM | POA: Diagnosis not present

## 2015-09-19 DIAGNOSIS — E119 Type 2 diabetes mellitus without complications: Secondary | ICD-10-CM | POA: Diagnosis not present

## 2015-09-19 DIAGNOSIS — H2513 Age-related nuclear cataract, bilateral: Secondary | ICD-10-CM | POA: Diagnosis not present

## 2015-09-19 DIAGNOSIS — H5203 Hypermetropia, bilateral: Secondary | ICD-10-CM | POA: Diagnosis not present

## 2015-09-20 ENCOUNTER — Encounter: Payer: Self-pay | Admitting: Internal Medicine

## 2015-09-20 ENCOUNTER — Ambulatory Visit (INDEPENDENT_AMBULATORY_CARE_PROVIDER_SITE_OTHER): Payer: PPO | Admitting: Internal Medicine

## 2015-09-20 ENCOUNTER — Other Ambulatory Visit
Admission: RE | Admit: 2015-09-20 | Discharge: 2015-09-20 | Disposition: A | Discharge status: Home / Self Care | Payer: PPO | Source: Ambulatory Visit | Attending: Internal Medicine | Admitting: Internal Medicine

## 2015-09-20 VITALS — BP 130/68 | HR 81 | Ht 72.0 in | Wt 293.0 lb

## 2015-09-20 DIAGNOSIS — Z888 Allergy status to other drugs, medicaments and biological substances status: Secondary | ICD-10-CM

## 2015-09-20 DIAGNOSIS — Z789 Other specified health status: Secondary | ICD-10-CM

## 2015-09-20 DIAGNOSIS — R0602 Shortness of breath: Secondary | ICD-10-CM

## 2015-09-20 DIAGNOSIS — M6289 Other specified disorders of muscle: Secondary | ICD-10-CM | POA: Diagnosis not present

## 2015-09-20 DIAGNOSIS — T464X1A Poisoning by angiotensin-converting-enzyme inhibitors, accidental (unintentional), initial encounter: Secondary | ICD-10-CM

## 2015-09-20 DIAGNOSIS — I493 Ventricular premature depolarization: Secondary | ICD-10-CM | POA: Insufficient documentation

## 2015-09-20 DIAGNOSIS — R29898 Other symptoms and signs involving the musculoskeletal system: Secondary | ICD-10-CM

## 2015-09-20 HISTORY — DX: Other specified health status: Z78.9

## 2015-09-20 LAB — BASIC METABOLIC PANEL
ANION GAP: 6 (ref 5–15)
BUN: 33 mg/dL — ABNORMAL HIGH (ref 6–20)
CHLORIDE: 105 mmol/L (ref 101–111)
CO2: 28 mmol/L (ref 22–32)
Calcium: 9.4 mg/dL (ref 8.9–10.3)
Creatinine, Ser: 1.43 mg/dL — ABNORMAL HIGH (ref 0.61–1.24)
GFR calc non Af Amer: 47 mL/min — ABNORMAL LOW (ref >60)
GFR, EST AFRICAN AMERICAN: 55 mL/min — AB (ref >60)
Glucose, Bld: 83 mg/dL (ref 65–99)
POTASSIUM: 4.3 mmol/L (ref 3.5–5.1)
Sodium: 139 mmol/L (ref 135–145)

## 2015-09-20 MED ORDER — MEXILETINE HCL 150 MG PO CAPS: 150.0000 mg | ORAL_CAPSULE | Freq: Two times a day (BID) | ORAL | Status: DC

## 2015-09-20 NOTE — Patient Instructions (Signed)
Medication Instructions: 1) Decrease aspirin to 81 mg one tablet by mouth once daily 2) Decrease Mexiletine to 150 mg one capsule by mouth twice daily (start when your current supply is done) 3) Restart Coreg (carvedilol) at 12.5 mg one tablet by mouth twice daily  Labwork: - Your physician recommends that you have lab work today: [employer]  Procedures/Testing: - Your physician has recommended that you wear a 24 hour holter monitor- in 2 months. Holter monitors are medical devices that record the heart's electrical activity. Doctors most often use these monitors to diagnose arrhythmias. Arrhythmias are problems with the speed or rhythm of the heartbeat. The monitor is a small, portable device. You can wear one while you do your normal daily activities. This is usually used to diagnose what is causing palpitations/syncope (passing out).  Follow-Up: - Your physician recommends that you schedule a follow-up appointment in: 2 months (just after you wear your holter monitor) with Ward Givens, NP/ Eula Listen, PA   Any Additional Special Instructions Will Be Listed Below (If Applicable).     If you need a refill on your cardiac medications before your next appointment, please call your pharmacy.

## 2015-09-20 NOTE — Progress Notes (Signed)
ELECTROPHYSIOLOGY CONSULT NOTE  Patient ID: [name] [name], MRN: [id number], DOB/AGE: May 11, 1941 75 y.o. Admit date: (Not on file) Date of Consult: 09/20/2015  Primary Physician: [name], MD Primary Cardiologist: ma   Consulting Physician Terrilee Files  Chief Complaint: PVC   HPI [name] [name] is a 75 y.o. male referred because of PVCs.  He has a history of ischemic heart disease. He has a history of recurrent MI. He underwent bypass surgery 10/15. Follow-up catheterization 9/16 demonstrated patent grafts.  Holter monitor 11/16 demonstrated 28,000 PVCs and mexiletine was started. A repeat Holter 12/16 demonstrated 1700 PVCs.  ECG 10/16>> morphology right bundle branch block left axis deviation. Mexiletine had been utilized to accomplish the PVC reduction; however, poorly tolerated and it was discontinued. Carvedilol dose was increased.  The patient then resumed it on his own and has been able to tolerate it. It is his impression and his wife that his energy level is a little bit better.  He does not have chest pain does have chronic edema.  Interestingly, did not have palpitations with his PVCs.  Review of his chart demonstrated that he was previously on an ACE inhibitor-lisinopril 20 mg daily. This was discontinued in December 2016 because of recurrent measurements of hyperkalemia.   DATE TEST    10/15 TEE RF 40-45%   9/16 echo   EF 30.35%   12/16 echo   EF 35-40      Past Medical History  Diagnosis Date  . Hypertension   . Hyperlipidemia   . Gout   . Diabetes mellitus without complication (HCC)   . Nephrolithiasis   . Umbilical hernia   . Epigastric hernia   . Obesity   . Chronic systolic CHF (congestive heart failure) (HCC)     a. EF 25-30% by 2D ECHO 04/20/14. Severely dec global hypokinesis. mildly elevated RVSP;  b. 12/2014 Echo: EF 40-45%, mod dil LA, mildly dil RV/RA;  c. 04/2015 Echo: EF 30-35%, sev antsept HK, Gr1 DD, mildly dil RA/LA;  d. 07/2015  Echo: EF 35-40%, ant/antsept HK, Gr1 DD, mildly red RV fxn.  [name] AAA (abdominal aortic aneurysm) (HCC)   . Ischemic cardiomyopathy     a. EF 25-30% 03/2014;  b. EF 40-45% 12/2014.  . Mitral regurgitation     a. 03/2014 TEE: mild to mod MR.  [name] Neuromuscular disorder (HCC)     DIABETIC NEUROPATHY  . Arthritis   . CAD (coronary artery disease)     a. 03/2014 NSTEMI--> 05/2014 CABG x 5 (LIMA->D1, VG->LAD, VG->OM1, VG->OM2, VG->RPDA);  c. 04/2015 Cath: LM nl, LAD 100ost, 40d, RI 80, LCX 80p/m, OM1 70, OM2 80, RCA 20p, 67m, 90d, VG->dLAD min irregs, VG->OM1 100, VG->OM2 100, VG->RPDA nl, LIMA->D2 nl.  . Frequent PVCs     a. 24 hr Holter 06/2015: >28K PVCs accounting for 25% of all beats, rare PAC-->mexilitene started;  b. 07/2015 Holter: 1700 PVC's/24 hrs.      Surgical History:  Past Surgical History  Procedure Laterality Date  . Rotator cuff repair Right 1996,1997,2000  . Carpal tunnel release Right   . Tee without cardioversion N/A 04/24/2014    Procedure: TRANSESOPHAGEAL ECHOCARDIOGRAM (TEE);  Surgeon: [name], MD;  Location: [facility] ENDOSCOPY;  Service: Cardiovascular;  Laterality: N/A;  . Cardiac catheterization      [facility]  . Coronary artery bypass graft N/A 06/06/2014    Procedure: CORONARY ARTERY BYPASS GRAFTING (CABG) x 5 USING LEFT AND RIGHT SAPHENOUS LEG VEIN HARVESTED ENDOSCOPICALLY;  Surgeon: Kerin Perna, MD;  Location: [facility] OR;  Service: Open Heart Surgery;  Laterality: N/A;  . Intraoperative transesophageal echocardiogram N/A 06/06/2014    Procedure: INTRAOPERATIVE TRANSESOPHAGEAL ECHOCARDIOGRAM;  Surgeon: Kerin Perna, MD;  Location: [facility] OR;  Service: Open Heart Surgery;  Laterality: N/A;  . Cardiac catheterization N/A 05/23/2015    Procedure: Left Heart Cath and Coronary Angiography;  Surgeon: Iran Ouch, MD;  Location: [facility] INVASIVE CV LAB;  Service: Cardiovascular;  Laterality: N/A;     Home Meds: Prior to Admission medications   Medication Sig Start Date End Date  Taking? Authorizing Provider  aspirin EC 325 MG EC tablet Take 1 tablet (325 mg total) by mouth daily. 06/14/14  Yes Donielle Margaretann Loveless, PA-C  atorvastatin (LIPITOR) 40 MG tablet TAKE ONE TABLET BY MOUTH ONCE DAILY 09/07/15  Yes Iran Ouch, MD  carvedilol (COREG) 12.5 MG tablet Take 1 tablet (12.5 mg total) by mouth 2 (two) times daily. Patient taking differently: Take 0.5 mg by mouth 2 (two) times daily.  08/23/15  Yes Ok Anis, NP  colchicine 0.6 MG tablet Take 0.6 mg by mouth daily as needed (for gout flareup).    Yes Historical Provider, MD  furosemide (LASIX) 40 MG tablet TAKE ONE TABLET BY MOUTH ONCE DAILY 08/30/15  Yes Iran Ouch, MD  gabapentin (NEURONTIN) 300 MG capsule Take 300 mg by mouth 4 (four) times daily.  01/02/14  Yes Historical Provider, MD  glimepiride (AMARYL) 2 MG tablet Take 2 mg by mouth 2 (two) times daily.   Yes Historical Provider, MD  mexiletine (MEXITIL) 200 MG capsule Take 1 capsule (200 mg total) by mouth 2 (two) times daily. 07/17/15  Yes Ryan M Dunn, PA-C  nitroGLYCERIN (NITROSTAT) 0.4 MG SL tablet Place 0.4 mg under the tongue every 5 (five) minutes as needed for chest pain.   Yes Historical Provider, MD    Allergies: No Known Allergies  Social History   Social History  . Marital Status: Single    Spouse Name: N/A  . Number of Children: N/A  . Years of Education: N/A   Occupational History  . Not on file.   Social History Main Topics  . Smoking status: Former Smoker -- 1.00 packs/day for 25 years    Types: Cigarettes  . Smokeless tobacco: Never Used  . Alcohol Use: No  . Drug Use: No  . Sexual Activity: Not on file   Other Topics Concern  . Not on file   Social History Narrative     Family History  Problem Relation Age of Onset  . Colon polyps Brother   . Lung cancer Brother   . Throat cancer Father   . Stroke Father      ROS:  Please see the history of present illness.     All other systems reviewed and negative.      Physical Exam:   Blood pressure 130/68, pulse 81, height 6' (1.829 m), weight 293 lb (132.904 kg). General: Well developed,Morbidly obese   male in no acute distress. Head: Normocephalic, atraumatic, sclera non-icteric, no xanthomas, nares are without discharge. EENT: normal  Lymph Nodes:  none Neck: Negative for carotid bruits. JVD 8 Back:without scoliosis kyphosis  Lungs: Clear bilaterally to auscultation without wheezes, rales, or rhonchi. Breathing is unlabored. Heart: RRR with S1 S2. N 2/6 systolic  murmur . No rubs, or gallops appreciated. Abdomen: Soft, non-tender, non-distended with normoactive bowel sounds. No hepatomegaly. No rebound/guarding. No obvious abdominal masses. Msk:  Strength and tone appear normal for age. Extremities: No clubbing or cyanosis.  Tr+  edema.  Distal pedal pulses are 2+ and equal bilaterally. Skin: Warm and Dry Neuro: Alert and oriented X 3. CN III-XII intact Grossly normal sensory and motor function . Psych:  Responds to questions appropriately with a normal affect.      Labs: Cardiac Enzymes No results for input(s): CKTOTAL, CKMB, TROPONINI in the last 72 hours. CBC Lab Results  Component Value Date   WBC 10.5 05/15/2015   HGB 9.5* 06/13/2014   HCT 40.5 05/15/2015   MCV 91 05/15/2015   PLT 220 05/15/2015   PROTIME: No results for input(s): LABPROT, INR in the last 72 hours. Chemistry No results for input(s): NA, K, CL, CO2, BUN, CREATININE, CALCIUM, PROT, BILITOT, ALKPHOS, ALT, AST, GLUCOSE in the last 168 hours.  Invalid input(s): LABALBU Lipids Lab Results  Component Value Date   CHOL 95* 12/25/2014   HDL 25* 12/25/2014   LDLCALC 44 12/25/2014   TRIG 129 12/25/2014   BNP PRO B NATRIURETIC PEPTIDE (BNP)  Date/Time Value Ref Range Status  04/25/2014 10:00 AM 308.5* 0 - 125 pg/mL Final   Thyroid Function Tests: No results for input(s): TSH, T4TOTAL, T3FREE, THYROIDAB in the last 72 hours.  Invalid input(s):  FREET3 Miscellaneous No results found for: DDIMER  Radiology/Studies:  No results found.  EKG:  Sinus rhythm at 81 Intervals 21/10/36% axis left at -27 Otherwise normal   Assessment and Plan:  PVCs right bundle left axis deviation  Ischemic cardiomyopathy/component of ectopy cardiomyopathy  Bypass surgery  Bilateral hand weakness  Hyperkalemia on ACE inhibitorS  He has been able to tolerate his mexiletine and there has been measured significant decrease in his PVC burden. His ECG today has no ectopy. He thinks his energy level is a little bit better.  We will plan to decrease his mexiletine next month from 200-150 twice daily. We will arrange for a Holter monitor and I'll have him see CB/RD thereafter. If the ectopy remains suppressed, it may be valuable to re-evaluate LV function.  With his ischemic heart disease, we will leave him on the uptitrated dose of carvedilol. I have taken the liberty of decreasing his aspirin from 325--81.  We will reassess his potassium. If his normal, I would like to try to re-exposing 2 a lower dose of lisinopril  I have reached out to the heart failure team iand renal to get their input. I cannot find anything in the literature in a rapid search.  All 3 have suggested ok to try   I don't know if there is anything that can be done about his hands. He couldn't even button his shirt. I suggested that perhaps seen hand expertly Dr. Amanda Pea might be of some benefit.       Sherryl Manges

## 2015-09-27 ENCOUNTER — Telehealth: Payer: Self-pay | Admitting: Internal Medicine

## 2015-09-27 NOTE — Telephone Encounter (Signed)
Patient is scheduled for a holter in [place] tomorrow morning at 8:10 am. He does not need this for another 2 months. Attempted to call the patient at his home/ cell #'s to cancel for tomorrow. Cell #- no voice mail set up and home # - no answer x multiple rings. Message to Jasmine December and Angelica Chessman to please r/s for 2 months out if the patient shows in the office tomorrow.

## 2015-10-04 ENCOUNTER — Encounter: Payer: Self-pay | Admitting: *Deleted

## 2015-11-06 ENCOUNTER — Ambulatory Visit (INDEPENDENT_AMBULATORY_CARE_PROVIDER_SITE_OTHER): Payer: PPO

## 2015-11-06 DIAGNOSIS — I493 Ventricular premature depolarization: Secondary | ICD-10-CM

## 2015-11-08 ENCOUNTER — Ambulatory Visit
Admission: RE | Admit: 2015-11-08 | Discharge: 2015-11-08 | Disposition: A | Discharge status: Home / Self Care | Payer: PPO | Source: Ambulatory Visit | Attending: Internal Medicine | Admitting: Internal Medicine

## 2015-11-08 ENCOUNTER — Other Ambulatory Visit: Payer: Self-pay | Admitting: Internal Medicine

## 2015-11-08 DIAGNOSIS — I493 Ventricular premature depolarization: Secondary | ICD-10-CM | POA: Insufficient documentation

## 2015-11-09 ENCOUNTER — Ambulatory Visit: Payer: PPO

## 2015-11-20 DIAGNOSIS — E119 Type 2 diabetes mellitus without complications: Secondary | ICD-10-CM | POA: Diagnosis not present

## 2015-11-20 DIAGNOSIS — I10 Essential (primary) hypertension: Secondary | ICD-10-CM | POA: Diagnosis not present

## 2015-11-26 ENCOUNTER — Ambulatory Visit (INDEPENDENT_AMBULATORY_CARE_PROVIDER_SITE_OTHER): Payer: PPO | Admitting: Cardiovascular Disease

## 2015-11-26 ENCOUNTER — Encounter: Payer: Self-pay | Admitting: Cardiovascular Disease

## 2015-11-26 VITALS — BP 140/78 | HR 74 | Ht 70.0 in | Wt 301.0 lb

## 2015-11-26 DIAGNOSIS — I493 Ventricular premature depolarization: Secondary | ICD-10-CM | POA: Diagnosis not present

## 2015-11-26 DIAGNOSIS — I159 Secondary hypertension, unspecified: Secondary | ICD-10-CM | POA: Diagnosis not present

## 2015-11-26 MED ORDER — LOSARTAN POTASSIUM 25 MG PO TABS: 25.0000 mg | ORAL_TABLET | Freq: Every day | ORAL | Status: DC

## 2015-11-26 NOTE — Patient Instructions (Signed)
Medication Instructions:  Your physician has recommended you make the following change in your medication:  START taking losartan 25mg  daily   Labwork: BMET in one week  Testing/Procedures: none  Follow-Up: Your physician recommends that you schedule a follow-up appointment in: 3 months with Dr. Kirke Corin.    Any Other Special Instructions Will Be Listed Below (If Applicable).     If you need a refill on your cardiac medications before your next appointment, please call your pharmacy.

## 2015-11-27 DIAGNOSIS — Z6841 Body Mass Index (BMI) 40.0 and over, adult: Secondary | ICD-10-CM

## 2015-11-27 DIAGNOSIS — Z125 Encounter for screening for malignant neoplasm of prostate: Secondary | ICD-10-CM | POA: Diagnosis not present

## 2015-11-27 DIAGNOSIS — E1165 Type 2 diabetes mellitus with hyperglycemia: Secondary | ICD-10-CM | POA: Diagnosis not present

## 2015-11-27 DIAGNOSIS — Z0000 Encounter for general adult medical examination without abnormal findings: Secondary | ICD-10-CM | POA: Diagnosis not present

## 2015-11-27 DIAGNOSIS — E785 Hyperlipidemia, unspecified: Secondary | ICD-10-CM | POA: Diagnosis not present

## 2015-11-27 DIAGNOSIS — M1000 Idiopathic gout, unspecified site: Secondary | ICD-10-CM | POA: Diagnosis not present

## 2015-11-29 NOTE — Progress Notes (Signed)
Cardiology Office Note   Date:  11/29/2015   ID:  [name] [name], DOB Sep 08, 1940, MRN [id number]  PCP:  [name], MD  Cardiologist:   [name], MD   Chief Complaint  Patient presents with  . other    2 month follow up as well as discuss holter monitor. Meds reviewed by the patient verbally. "doing well."       History of Present Illness: [name] [name] is a 75 y.o. male who presents for a follow-up visit regarding coronary artery disease, chronic systolic heart failure and frequent PVCs. He underwent CABG x 5 in 05/2014 after presenting with non-ST elevation myocardial infarction and heart failure. EF pre-CABG was 25-30%. F/U echo in 12/2014 showed EF of 40-45%. In September 2016, he complained of worsening dyspnea and follow-up echo showed reduction in LV function to 30-35%. He was not significantly volume overloaded. He underwent repeat catheterization revealing 3 of 5 patent grafts with new occlusions of the vein grafts to the OM1 and OM 2. The native obtuse marginals were small and not felt to be amenable to PCI. Medical therapy was recommended. He was noted during catheterization have frequent PVCs and subsequent or a Holter monitor which revealed 28,000 PVCs in 24 hours, accounting for 25% of all of his beats. It was felt that PVC burden may be contracting to his cardiomyopathy and he was subsequently placed on mexiletine therapy. Follow-up Holter monitoring showed significant improvement in PVC burden, now down to 1724 hours. Follow-up echocardiography showed modestly improved LV function, now with an EF of 35-40%.  The dose of mexiletine was subsequently decreased due to GI symptoms and overall he has been doing well. Lisinopril was discontinued in the past due to hyperkalemia. Overall, he has been doing reasonably well and reports improvement in dyspnea and leg edema. He denies any chest pain.    Past Medical History  Diagnosis Date  . Hypertension   .  Hyperlipidemia   . Gout   . Diabetes mellitus without complication (HCC)   . Nephrolithiasis   . Umbilical hernia   . Epigastric hernia   . Obesity   . Chronic systolic CHF (congestive heart failure) (HCC)     a. EF 25-30% by 2D ECHO 04/20/14. Severely dec global hypokinesis. mildly elevated RVSP;  b. 12/2014 Echo: EF 40-45%, mod dil LA, mildly dil RV/RA;  c. 04/2015 Echo: EF 30-35%, sev antsept HK, Gr1 DD, mildly dil RA/LA;  d. 07/2015 Echo: EF 35-40%, ant/antsept HK, Gr1 DD, mildly red RV fxn.  [name] AAA (abdominal aortic aneurysm) (HCC)   . Ischemic cardiomyopathy     a. EF 25-30% 03/2014;  b. EF 40-45% 12/2014.  . Mitral regurgitation     a. 03/2014 TEE: mild to mod MR.  [name] Neuromuscular disorder (HCC)     DIABETIC NEUROPATHY  . Arthritis   . CAD (coronary artery disease)     a. 03/2014 NSTEMI--> 05/2014 CABG x 5 (LIMA->D1, VG->LAD, VG->OM1, VG->OM2, VG->RPDA);  c. 04/2015 Cath: LM nl, LAD 100ost, 40d, RI 80, LCX 80p/m, OM1 70, OM2 80, RCA 20p, 72m, 90d, VG->dLAD min irregs, VG->OM1 100, VG->OM2 100, VG->RPDA nl, LIMA->D2 nl.  . Frequent PVCs     a. 24 hr Holter 06/2015: >28K PVCs accounting for 25% of all beats, rare PAC-->mexilitene started;  b. 07/2015 Holter: 1700 PVC's/24 hrs.  . ACE inhibitor associated hyperkalemia 09/20/2015    Past Surgical History  Procedure Laterality Date  . Rotator cuff repair Right 1996,1997,2000  .  Carpal tunnel release Right   . Tee without cardioversion N/A 04/24/2014    Procedure: TRANSESOPHAGEAL ECHOCARDIOGRAM (TEE);  Surgeon: [name], MD;  Location: [facility] ENDOSCOPY;  Service: Cardiovascular;  Laterality: N/A;  . Cardiac catheterization      [facility]  . Coronary artery bypass graft N/A 06/06/2014    Procedure: CORONARY ARTERY BYPASS GRAFTING (CABG) x 5 USING LEFT AND RIGHT SAPHENOUS LEG VEIN HARVESTED ENDOSCOPICALLY;  Surgeon: Kerin Perna, MD;  Location: [facility] OR;  Service: Open Heart Surgery;  Laterality: N/A;  . Intraoperative transesophageal  echocardiogram N/A 06/06/2014    Procedure: INTRAOPERATIVE TRANSESOPHAGEAL ECHOCARDIOGRAM;  Surgeon: Kerin Perna, MD;  Location: [facility] OR;  Service: Open Heart Surgery;  Laterality: N/A;  . Cardiac catheterization N/A 05/23/2015    Procedure: Left Heart Cath and Coronary Angiography;  Surgeon: Iran Ouch, MD;  Location: [facility] INVASIVE CV LAB;  Service: Cardiovascular;  Laterality: N/A;     Current Outpatient Prescriptions  Medication Sig Dispense Refill  . aspirin EC 81 MG tablet Take 1 tablet (81 mg total) by mouth daily.    [name] atorvastatin (LIPITOR) 40 MG tablet TAKE ONE TABLET BY MOUTH ONCE DAILY 90 tablet 3  . carvedilol (COREG) 12.5 MG tablet Take 1 tablet (12.5 mg total) by mouth 2 (two) times daily.    . colchicine 0.6 MG tablet Take 0.6 mg by mouth daily as needed (for gout flareup).     . furosemide (LASIX) 40 MG tablet TAKE ONE TABLET BY MOUTH ONCE DAILY 30 tablet 6  . gabapentin (NEURONTIN) 300 MG capsule Take 300 mg by mouth 4 (four) times daily.     [name] glimepiride (AMARYL) 2 MG tablet Take 2 mg by mouth 2 (two) times daily.    [name] mexiletine (MEXITIL) 150 MG capsule Take 1 capsule (150 mg total) by mouth 2 (two) times daily. 60 capsule 6  . nitroGLYCERIN (NITROSTAT) 0.4 MG SL tablet Place 0.4 mg under the tongue every 5 (five) minutes as needed for chest pain.    [name] losartan (COZAAR) 25 MG tablet Take 1 tablet (25 mg total) by mouth daily. 30 tablet 3   No current facility-administered medications for this visit.    Allergies:   Review of patient's allergies indicates no known allergies.    Social History:  The patient  reports that he has quit smoking. His smoking use included Cigarettes. He has a 25 pack-year smoking history. He has never used smokeless tobacco. He reports that he does not drink alcohol or use illicit drugs.   Family History:  The patient's family history includes Colon polyps in his brother; Lung cancer in his brother; Stroke in his father; Throat cancer in  his father.    ROS:  Please see the history of present illness.   Otherwise, review of systems are positive for none.   All other systems are reviewed and negative.    PHYSICAL EXAM: VS:  BP 140/78 mmHg  Pulse 74  Ht  (1.778 m)  Wt 301 lb (136.533 kg)  BMI 43.19 kg/m2 , BMI Body mass index is 43.19 kg/(m^2). GEN: Well nourished, well developed, in no acute distress HEENT: normal Neck: no JVD, carotid bruits, or masses Cardiac: RRR; no murmurs, rubs, or gallops. Mild leg edema  Respiratory:  clear to auscultation bilaterally, normal work of breathing GI: soft, nontender, nondistended, + BS MS: no deformity or atrophy Skin: warm and dry, no rash Neuro:  Strength and sensation are intact Psych: euthymic mood, full affect  EKG:  EKG is ordered today. The ekg ordered today demonstrates normal sinus rhythm with first-degree AV block and left axis deviation.   Recent Labs: 12/25/2014: TSH 4.710* 05/08/2015: BNP 119.6* 05/15/2015: Platelets 220 07/17/2015: Magnesium 2.2 09/20/2015: BUN 33*; Creatinine, Ser 1.43*; Potassium 4.3; Sodium 139    Lipid Panel    Component Value Date/Time   CHOL 95* 12/25/2014 1215   CHOL 88 04/24/2014 0407   TRIG 129 12/25/2014 1215   HDL 25* 12/25/2014 1215   HDL 23* 04/24/2014 0407   CHOLHDL 3.8 12/25/2014 1215   CHOLHDL 3.8 04/24/2014 0407   VLDL 23 04/24/2014 0407   LDLCALC 44 12/25/2014 1215   LDLCALC 42 04/24/2014 0407      Wt Readings from Last 3 Encounters:  11/26/15 301 lb (136.533 kg)  09/20/15 293 lb (132.904 kg)  08/23/15 293 lb (132.904 kg)       ASSESSMENT AND PLAN:  1.  Chronic systolic heart failure: The patient's ejection fraction improved after treating his frequent PVCs. He appears to be euvolemic on current dose of furosemide. Continue carvedilol. He had hyperkalemia in the past with lisinopril and I elected to start him today on small dose losartan 25 mg once daily. Check basic metabolic profile in one  week.  2. Symptomatic PVCs: Significant improvement with mexiletine which is currently tolerated at the current dose. He follows up with Dr. Graciela Husbands.  3. Coronary artery disease: Status post CABG: He has no anginal symptoms at the present time.  4. Essential hypertension: Blood pressure is well controlled on current medications.  5. Hyperlipidemia: Continue treatment with atorvastatin. Most recent LDL was 44.      Disposition:   FU with me in 3 months  Signed,  [name], MD  11/29/2015 4:42 PM    [facility]

## 2015-12-04 ENCOUNTER — Other Ambulatory Visit (INDEPENDENT_AMBULATORY_CARE_PROVIDER_SITE_OTHER): Payer: PPO

## 2015-12-04 ENCOUNTER — Telehealth: Payer: Self-pay

## 2015-12-04 DIAGNOSIS — I493 Ventricular premature depolarization: Secondary | ICD-10-CM

## 2015-12-04 DIAGNOSIS — I159 Secondary hypertension, unspecified: Secondary | ICD-10-CM

## 2015-12-04 NOTE — Telephone Encounter (Signed)
Mr. [name] [name] wanted Dr. Mariah Milling to know that his PCP started him on a new medication Tradjenta 5 mg one tablet daily.  Please contact the patient to let him know that this medication will not interfere with his cardiac medications.

## 2015-12-04 NOTE — Telephone Encounter (Signed)
There is a moderate interaction listed b/t Tradjenta & furosemide: "Furosemide may interfere with blood glucose control and reduce the effectiveness of linagliptin and other diabetic medications". Advised pt of this, he states that he "can't do without either of these meds" and does not want to make any changes unless absolutely necessary. Advised pt to closely monitor his BG while on these meds. He reports BG in the 170s, understands that he needs tight control, he is trying this new med, as well as making changes to his diet & exercise in an effort to lose approx 40 lbs.  Asked him to call back w/ any further questions or concerns.

## 2015-12-05 LAB — BASIC METABOLIC PANEL
BUN/Creatinine Ratio: 19 (ref 10–24)
BUN: 29 mg/dL — ABNORMAL HIGH (ref 8–27)
CO2: 24 mmol/L (ref 18–29)
CREATININE: 1.56 mg/dL — AB (ref 0.76–1.27)
Calcium: 8.8 mg/dL (ref 8.6–10.2)
Chloride: 100 mmol/L (ref 96–106)
GFR, EST AFRICAN AMERICAN: 51 mL/min/{1.73_m2} — AB (ref >59)
GFR, EST NON AFRICAN AMERICAN: 44 mL/min/{1.73_m2} — AB (ref >59)
Glucose: 148 mg/dL — ABNORMAL HIGH (ref 65–99)
Potassium: 5 mmol/L (ref 3.5–5.2)
SODIUM: 142 mmol/L (ref 134–144)

## 2015-12-09 ENCOUNTER — Emergency Department
Admission: EM | Admit: 2015-12-09 | Discharge: 2015-12-09 | Disposition: A | Discharge status: Home / Self Care | Payer: PPO | Attending: Emergency Medicine | Admitting: Emergency Medicine

## 2015-12-09 ENCOUNTER — Encounter: Payer: Self-pay | Admitting: Emergency Medicine

## 2015-12-09 DIAGNOSIS — Z23 Encounter for immunization: Secondary | ICD-10-CM | POA: Diagnosis not present

## 2015-12-09 DIAGNOSIS — W1841XA Slipping, tripping and stumbling without falling due to stepping on object, initial encounter: Secondary | ICD-10-CM | POA: Diagnosis not present

## 2015-12-09 DIAGNOSIS — Y9389 Activity, other specified: Secondary | ICD-10-CM | POA: Diagnosis not present

## 2015-12-09 DIAGNOSIS — S61216A Laceration without foreign body of right little finger without damage to nail, initial encounter: Secondary | ICD-10-CM | POA: Insufficient documentation

## 2015-12-09 DIAGNOSIS — E119 Type 2 diabetes mellitus without complications: Secondary | ICD-10-CM | POA: Insufficient documentation

## 2015-12-09 DIAGNOSIS — Z7982 Long term (current) use of aspirin: Secondary | ICD-10-CM | POA: Insufficient documentation

## 2015-12-09 DIAGNOSIS — I714 Abdominal aortic aneurysm, without rupture: Secondary | ICD-10-CM | POA: Insufficient documentation

## 2015-12-09 DIAGNOSIS — Z87891 Personal history of nicotine dependence: Secondary | ICD-10-CM | POA: Insufficient documentation

## 2015-12-09 DIAGNOSIS — Z7984 Long term (current) use of oral hypoglycemic drugs: Secondary | ICD-10-CM | POA: Diagnosis not present

## 2015-12-09 DIAGNOSIS — Y999 Unspecified external cause status: Secondary | ICD-10-CM | POA: Insufficient documentation

## 2015-12-09 DIAGNOSIS — I5022 Chronic systolic (congestive) heart failure: Secondary | ICD-10-CM | POA: Diagnosis not present

## 2015-12-09 DIAGNOSIS — I110 Hypertensive heart disease with heart failure: Secondary | ICD-10-CM | POA: Diagnosis not present

## 2015-12-09 DIAGNOSIS — E785 Hyperlipidemia, unspecified: Secondary | ICD-10-CM | POA: Diagnosis not present

## 2015-12-09 DIAGNOSIS — Y9289 Other specified places as the place of occurrence of the external cause: Secondary | ICD-10-CM | POA: Insufficient documentation

## 2015-12-09 DIAGNOSIS — I25810 Atherosclerosis of coronary artery bypass graft(s) without angina pectoris: Secondary | ICD-10-CM | POA: Insufficient documentation

## 2015-12-09 MED ORDER — TETANUS-DIPHTH-ACELL PERTUSSIS 5-2.5-18.5 LF-MCG/0.5 IM SUSP
0.5000 mL | Freq: Once | INTRAMUSCULAR | Status: AC
  Administered 2015-12-09: 0.5 mL via INTRAMUSCULAR
  Filled 2015-12-09: qty 0.5

## 2015-12-09 MED ORDER — LIDOCAINE HCL (PF) 1 % IJ SOLN
10.0000 mL | Freq: Once | INTRAMUSCULAR | Status: AC
  Administered 2015-12-09: 10 mL
  Filled 2015-12-09: qty 10

## 2015-12-09 NOTE — Discharge Instructions (Signed)
Laceration Care, Adult  A laceration is a cut that goes through all layers of the skin. The cut also goes into the tissue that is right under the skin. Some cuts heal on their own. Others need to be closed with stitches (sutures), staples, skin adhesive strips, or wound glue. Taking care of your cut lowers your risk of infection and helps your cut to heal better.  HOW TO TAKE CARE OF YOUR CUT  For stitches or staples:  · Keep the wound clean and dry.  · If you were given a bandage (dressing), you should change it at least one time per day or as told by your doctor. You should also change it if it gets wet or dirty.  · Keep the wound completely dry for the first 24 hours or as told by your doctor. After that time, you may take a shower or a bath. However, make sure that the wound is not soaked in water until after the stitches or staples have been removed.  · Clean the wound one time each day or as told by your doctor:    Wash the wound with soap and water.    Rinse the wound with water until all of the soap comes off.    Pat the wound dry with a clean towel. Do not rub the wound.  · After you clean the wound, put a thin layer of antibiotic ointment on it as told by your doctor. This ointment:    Helps to prevent infection.    Keeps the bandage from sticking to the wound.  · Have your stitches or staples removed as told by your doctor.  If your doctor used skin adhesive strips:   · Keep the wound clean and dry.  · If you were given a bandage, you should change it at least one time per day or as told by your doctor. You should also change it if it gets dirty or wet.  · Do not get the skin adhesive strips wet. You can take a shower or a bath, but be careful to keep the wound dry.  · If the wound gets wet, pat it dry with a clean towel. Do not rub the wound.  · Skin adhesive strips fall off on their own. You can trim the strips as the wound heals. Do not remove any strips that are still stuck to the wound. They will  fall off after a while.  If your doctor used wound glue:  · Try to keep your wound dry, but you may briefly wet it in the shower or bath. Do not soak the wound in water, such as by swimming.  · After you take a shower or a bath, gently pat the wound dry with a clean towel. Do not rub the wound.  · Do not do any activities that will make you really sweaty until the skin glue has fallen off on its own.  · Do not apply liquid, cream, or ointment medicine to your wound while the skin glue is still on.  · If you were given a bandage, you should change it at least one time per day or as told by your doctor. You should also change it if it gets dirty or wet.  · If a bandage is placed over the wound, do not let the tape for the bandage touch the skin glue.  · Do not pick at the glue. The skin glue usually stays on for 5-10 days. Then, it   or when wound glue stays in place and the wound is healed. Make sure to wear a sunscreen of at least 30 SPF.  Take over-the-counter and prescription medicines only as told by your doctor.  If you were given antibiotic medicine or ointment, take or apply it as told by your doctor. Do not stop using the antibiotic even if your wound is getting better.  Do not scratch or pick at the wound.  Keep all follow-up visits as told by your doctor. This is important.  Check your wound every day for signs of infection. Watch for:  Redness, swelling, or pain.  Fluid, blood, or pus.  Raise (elevate) the injured area above the level of your heart while you are sitting or lying down, if possible. GET HELP IF:  You got a tetanus shot and you have any of these problems at the injection site:  Swelling.  Very bad pain.  Redness.  Bleeding.  You have a fever.  A wound that was  closed breaks open.  You notice a bad smell coming from your wound or your bandage.  You notice something coming out of the wound, such as wood or glass.  Medicine does not help your pain.  You have more redness, swelling, or pain at the site of your wound.  You have fluid, blood, or pus coming from your wound.  You notice a change in the color of your skin near your wound.  You need to change the bandage often because fluid, blood, or pus is coming from the wound.  You start to have a new rash.  You start to have numbness around the wound. GET HELP RIGHT AWAY IF:  You have very bad swelling around the wound.  Your pain suddenly gets worse and is very bad.  You notice painful lumps near the wound or on skin that is anywhere on your body.  You have a red streak going away from your wound.  The wound is on your hand or foot and you cannot move a finger or toe like you usually can.  The wound is on your hand or foot and you notice that your fingers or toes look pale or bluish.   This information is not intended to replace advice given to you by your health care provider. Make sure you discuss any questions you have with your health care provider.   Document Released: 01/28/2008 Document Revised: 12/26/2014 Document Reviewed: 08/07/2014 Elsevier Interactive Patient Education 2016 [employer]    WOUND CARE Please return in 12 days to have your stitches/staples removed or sooner if you have concerns.  Keep area clean and dry for 24 hours. Do not remove bandage, if applied.  After 24 hours, remove bandage and wash wound gently with mild soap and warm water. Reapply a new bandage after cleaning wound, if directed.  Continue daily cleansing with soap and water until stitches/staples are removed.  Do not apply any ointments or creams to the wound while stitches/staples are in place, as this may cause delayed healing.  Notify the office if you experience any of the  following signs of infection: Swelling, redness, pus drainage, streaking, fever >101.0 F  Notify the office if you experience excessive bleeding that does not stop after 15-20 minutes of constant, firm pressure.  Keep area clean and dry. Your primary care doctor should be able to take your sutures out in 12 days. Take Tylenol if needed for pain. Elevate your hand to reduce swelling.

## 2015-12-09 NOTE — ED Notes (Addendum)
Pt presents with a R pinky laceration.

## 2015-12-09 NOTE — ED Notes (Signed)
Pt states he sliced his pinky finger on an aluminum shower door. Pt present with 1in lac to R pinky finger, bleeding controlled at this time. Pt maintains sensation and movement at this time, cap refill < 3 sec.

## 2015-12-09 NOTE — ED Provider Notes (Signed)
[facility] Emergency Department Provider Note  ____________________________________________  Time seen: Approximately 9:39 AM  I have reviewed the triage vital signs and the nursing notes.   HISTORY  Chief Complaint Extremity Laceration   HPI [name] [name] is a 75 y.o. male slipped in the shower this morning cutting his right fifth finger on the aluminum shower door. Patient is unsure of last tetanus immunization he received.He denies any head injury or loss of consciousness during this time. He was able to control bleeding with direct pressure. He denies any other injuries. Patient rates his pain at 2/10.   Past Medical History  Diagnosis Date  . Hypertension   . Hyperlipidemia   . Gout   . Diabetes mellitus without complication (HCC)   . Nephrolithiasis   . Umbilical hernia   . Epigastric hernia   . Obesity   . Chronic systolic CHF (congestive heart failure) (HCC)     a. EF 25-30% by 2D ECHO 04/20/14. Severely dec global hypokinesis. mildly elevated RVSP;  b. 12/2014 Echo: EF 40-45%, mod dil LA, mildly dil RV/RA;  c. 04/2015 Echo: EF 30-35%, sev antsept HK, Gr1 DD, mildly dil RA/LA;  d. 07/2015 Echo: EF 35-40%, ant/antsept HK, Gr1 DD, mildly red RV fxn.  [name] AAA (abdominal aortic aneurysm) (HCC)   . Ischemic cardiomyopathy     a. EF 25-30% 03/2014;  b. EF 40-45% 12/2014.  . Mitral regurgitation     a. 03/2014 TEE: mild to mod MR.  [name] Neuromuscular disorder (HCC)     DIABETIC NEUROPATHY  . Arthritis   . CAD (coronary artery disease)     a. 03/2014 NSTEMI--> 05/2014 CABG x 5 (LIMA->D1, VG->LAD, VG->OM1, VG->OM2, VG->RPDA);  c. 04/2015 Cath: LM nl, LAD 100ost, 40d, RI 80, LCX 80p/m, OM1 70, OM2 80, RCA 20p, 36m, 90d, VG->dLAD min irregs, VG->OM1 100, VG->OM2 100, VG->RPDA nl, LIMA->D2 nl.  . Frequent PVCs     a. 24 hr Holter 06/2015: >28K PVCs accounting for 25% of all beats, rare PAC-->mexilitene started;  b. 07/2015 Holter: 1700 PVC's/24 hrs.  . ACE  inhibitor associated hyperkalemia 09/20/2015    Patient Active Problem List   Diagnosis Date Noted  . ACE inhibitor associated hyperkalemia 09/20/2015  . Weakness of hand -bilateral 09/20/2015  . Cardiomyopathy, ischemic 06/13/2015  . Coronary artery disease involving native coronary artery with other forms of angina pectoris (HCC)   . AAA (abdominal aortic aneurysm) (HCC) 04/19/2015  . Right foot ulcer (HCC) 01/16/2015  . 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 CAD (coronary artery disease)   . Chronic systolic CHF (congestive heart failure) (HCC)   . Obesity   . Gout   . Hyperlipidemia   . Hypertension   . Diabetes mellitus without complication (HCC)   . Diastasis, muscle 01/12/2014  . Umbilical hernia 01/12/2014  . Epigastric hernia 01/12/2014  . Morbid obesity (HCC) 01/12/2014    Past Surgical History  Procedure Laterality Date  . Rotator cuff repair Right 1996,1997,2000  . Carpal tunnel release Right   . Tee without cardioversion N/A 04/24/2014    Procedure: TRANSESOPHAGEAL ECHOCARDIOGRAM (TEE);  Surgeon: [name], MD;  Location[facility] ENDOSCOPY;  Service: Cardiovascular;  Laterality: N/A;  . Cardiac catheterization      [facility]  . Coronary artery bypass graft N/A 06/06/2014    Procedure: CORONARY ARTERY BYPASS GRAFTING (CABG) x 5 USING LEFT AND RIGHT SAPHENOUS LEG VEIN HARVESTED ENDOSCOPICALLY;  Surgeon: Kerin Perna, MD;  Location: [facility] OR;  Service: Open Heart  Surgery;  Laterality: N/A;  . Intraoperative transesophageal echocardiogram N/A 06/06/2014    Procedure: INTRAOPERATIVE TRANSESOPHAGEAL ECHOCARDIOGRAM;  Surgeon: Kerin Perna, MD;  Location: [facility] OR;  Service: Open Heart Surgery;  Laterality: N/A;  . Cardiac catheterization N/A 05/23/2015    Procedure: Left Heart Cath and Coronary Angiography;  Surgeon: Iran Ouch, MD;  Location: [facility] INVASIVE CV LAB;  Service: Cardiovascular;  Laterality: N/A;    Current Outpatient Rx  Name  Route  Sig  Dispense  Refill  . aspirin EC  81 MG tablet   Oral   Take 1 tablet (81 mg total) by mouth daily.         [name] atorvastatin (LIPITOR) 40 MG tablet      TAKE ONE TABLET BY MOUTH ONCE DAILY   90 tablet   3   . carvedilol (COREG) 12.5 MG tablet   Oral   Take 1 tablet (12.5 mg total) by mouth 2 (two) times daily.         . colchicine 0.6 MG tablet   Oral   Take 0.6 mg by mouth daily as needed (for gout flareup).          . furosemide (LASIX) 40 MG tablet      TAKE ONE TABLET BY MOUTH ONCE DAILY   30 tablet   6   . gabapentin (NEURONTIN) 300 MG capsule   Oral   Take 300 mg by mouth 4 (four) times daily.          [name] glimepiride (AMARYL) 2 MG tablet   Oral   Take 2 mg by mouth 2 (two) times daily.         [name] losartan (COZAAR) 25 MG tablet   Oral   Take 1 tablet (25 mg total) by mouth daily.   30 tablet   3   . mexiletine (MEXITIL) 150 MG capsule   Oral   Take 1 capsule (150 mg total) by mouth 2 (two) times daily.   60 capsule   6   . nitroGLYCERIN (NITROSTAT) 0.4 MG SL tablet   Sublingual   Place 0.4 mg under the tongue every 5 (five) minutes as needed for chest pain.           Allergies Review of patient's allergies indicates no known allergies.  Family History  Problem Relation Age of Onset  . Colon polyps Brother   . Lung cancer Brother   . Throat cancer Father   . Stroke Father     Social History Social History  Substance Use Topics  . Smoking status: Former Smoker -- 1.00 packs/day for 25 years    Types: Cigarettes  . Smokeless tobacco: Former Neurosurgeon    Types: Chew  . Alcohol Use: No    Review of Systems Constitutional: No fever/chills Cardiovascular: Denies chest pain. Respiratory: Denies shortness of breath. Gastrointestinal:.  No nausea, no vomiting.   Skin: Positive laceration. Neurological: Negative for headaches, focal weakness. Positive diabetic neuropathy.  10-point ROS otherwise negative.  ____________________________________________   PHYSICAL  EXAM:  VITAL SIGNS: ED Triage Vitals  Enc Vitals Group     BP 12/09/15 0907 163/69 mmHg     Pulse Rate 12/09/15 0907 96     Resp 12/09/15 0907 18     Temp 12/09/15 0907 98.7 F (37.1 C)     Temp Source 12/09/15 0907 Oral     SpO2 12/09/15 0907 92 %     Weight 12/09/15 0907 290 lb (131.543 kg)  Height 12/09/15 0907 6' (1.829 m)     Head Cir --      Peak Flow --      Pain Score 12/09/15 0910 2     Pain Loc --      Pain Edu? --      Excl. in GC? --     Constitutional: Alert and oriented. Well appearing and in no acute distress. Eyes: Conjunctivae are normal. PERRL. EOMI. Head: Atraumatic. Nose: No congestion/rhinnorhea. Neck: No stridor.   Cardiovascular: Normal rate, regular rhythm. Grossly normal heart sounds.  Good peripheral circulation.Cap refill right fifth finger less than 3 seconds. Respiratory: Normal respiratory effort.  No retractions. Lungs CTAB. Gastrointestinal: Soft and nontender. No distention.  Musculoskeletal: Moves upper and lower extremities without any difficulty. Patient is able to flex and extend his right fifth finger without any difficulty. Motor sensory function intact distal to the laceration. Neurologic:  Normal speech and language. No gross focal neurologic deficits are appreciated. No gait instability. Skin:  Skin is warm, dry and intact. Laceration present on the volar aspect of the base of the right fifth finger. No active bleeding is present. Laceration goes vertical across the base. No foreign body is noted. Laceration measures approximately 2.0 cm. Psychiatric: Mood and affect are normal. Speech and behavior are normal.  ____________________________________________   LABS (all labs ordered are listed, but only abnormal results are displayed)  Labs Reviewed - No data to display   PROCEDURES  Procedure(s) performed: LACERATION REPAIR Performed by: Tommi Rumps Authorized by: Tommi Rumps Consent: Verbal consent obtained. Risks  and benefits: risks, benefits and alternatives were discussed Consent given by: patient Patient identity confirmed: provided demographic data Prepped and Draped in normal sterile fashion Wound explored  Laceration Location: Right fifth finger base, volar aspect  Laceration Length: 2.0 cm  No Foreign Bodies seen or palpated  Anesthesia: Digital block   Local anesthetic: lidocaine 1 % without epinephrine  Anesthetic total: 6 ml  Irrigation method: syringe Amount of cleaning: standard  Skin closure: 4-0 Ethilon   Number of sutures: 5  Technique: Simple interrupted   Patient tolerance: Patient tolerated the procedure well with no immediate complications.  Critical Care performed: No  ____________________________________________   INITIAL IMPRESSION / ASSESSMENT AND PLAN / ED COURSE  Pertinent labs & imaging results that were available during my care of the patient were reviewed by me and considered in my medical decision making (see chart for details).  Patient was given him information about cleaning his suture site and also encouraged to follow up with his primary care doctor if his doctor is able take sutures out in the office. Tylenol as needed for pain if needed. He is return to the emergency room over the holiday weekend if any signs of infection. ____________________________________________   FINAL CLINICAL IMPRESSION(S) / ED DIAGNOSES  Final diagnoses:  Laceration of fifth finger, right, initial encounter      Tommi Rumps, PA-C 12/09/15 1552

## 2016-01-22 ENCOUNTER — Telehealth: Payer: Self-pay | Admitting: Cardiovascular Disease

## 2016-01-22 NOTE — Telephone Encounter (Signed)
No answer, no VM on home phone. S/w pt on cell phone. Identified myself and we were then disconnected. Called pt back. No answer, no VM.

## 2016-01-22 NOTE — Telephone Encounter (Signed)
Patient came into office wanting to see a nurse appt a rx for glucose test strips .  Patient went to primary care office and they refused to see him without an appt.  Patient was notified that the Clinical staff were in clinic but a message would be sent for them to work on this and to give him a call.  Patient wanted to speak with a nurse at that moment and when an offer to take a msg and call him back was given he got up from the registration desk and walked away stating he would just go somewhere else.   Patient also stated when he gets upset / frustrated he has a hard time catching his breath.   Patient seemed frustrated and anxious.   Please call  Patient to check on sob and to see if there is anything we can do about test strips.

## 2016-01-23 NOTE — Telephone Encounter (Signed)
Attempted to contact pt.  No answer, no VM on home phone.

## 2016-01-23 NOTE — Telephone Encounter (Signed)
Called pt cell phone. Male answered the phone but no other response. Attempted to have him speak to me but could only hear background noise. Phone disconnected.

## 2016-02-05 ENCOUNTER — Encounter: Payer: Self-pay | Admitting: Cardiovascular Disease

## 2016-02-05 ENCOUNTER — Encounter: Payer: Self-pay | Admitting: *Deleted

## 2016-02-05 ENCOUNTER — Other Ambulatory Visit: Payer: Self-pay

## 2016-02-05 ENCOUNTER — Telehealth: Payer: Self-pay | Admitting: Gastroenterology

## 2016-02-05 NOTE — Telephone Encounter (Signed)
Pt scheduled for EGD on Monday, June 19th at [facility] for Dysphagia. R13.10. Please precert,

## 2016-02-05 NOTE — Telephone Encounter (Signed)
Please call patient. He had an appointment with Dr Kirke Corin today. Dr Jari Sportsman nurse sent him upstairs to the [facility] office to schedule an EGD / Appointment with Dr Servando Snare. I attempted to make an appointment for him, however he did not want to wait until July. He is having trouble swallowing, food getting stuck and epigastric pain. Can he get an EGD scheduled before appointment? You may reach him on his cell or at [phone]

## 2016-02-07 NOTE — Discharge Instructions (Signed)

## 2016-02-11 ENCOUNTER — Encounter: Payer: Self-pay | Admitting: *Deleted

## 2016-02-11 ENCOUNTER — Ambulatory Visit: Payer: PPO | Admitting: Anesthesiology

## 2016-02-11 ENCOUNTER — Ambulatory Visit
Admission: RE | Admit: 2016-02-11 | Discharge: 2016-02-11 | Disposition: A | Discharge status: Home / Self Care | Payer: PPO | Source: Ambulatory Visit | Attending: Gastroenterology | Admitting: Gastroenterology

## 2016-02-11 ENCOUNTER — Encounter
Admission: RE | Disposition: A | Discharge status: Home / Self Care | Payer: Self-pay | Source: Ambulatory Visit | Attending: Gastroenterology

## 2016-02-11 DIAGNOSIS — Z951 Presence of aortocoronary bypass graft: Secondary | ICD-10-CM | POA: Diagnosis not present

## 2016-02-11 DIAGNOSIS — E669 Obesity, unspecified: Secondary | ICD-10-CM | POA: Insufficient documentation

## 2016-02-11 DIAGNOSIS — E785 Hyperlipidemia, unspecified: Secondary | ICD-10-CM | POA: Insufficient documentation

## 2016-02-11 DIAGNOSIS — K219 Gastro-esophageal reflux disease without esophagitis: Secondary | ICD-10-CM | POA: Diagnosis not present

## 2016-02-11 DIAGNOSIS — Z801 Family history of malignant neoplasm of trachea, bronchus and lung: Secondary | ICD-10-CM | POA: Diagnosis not present

## 2016-02-11 DIAGNOSIS — M109 Gout, unspecified: Secondary | ICD-10-CM | POA: Insufficient documentation

## 2016-02-11 DIAGNOSIS — I714 Abdominal aortic aneurysm, without rupture: Secondary | ICD-10-CM | POA: Insufficient documentation

## 2016-02-11 DIAGNOSIS — Z7982 Long term (current) use of aspirin: Secondary | ICD-10-CM | POA: Diagnosis not present

## 2016-02-11 DIAGNOSIS — I255 Ischemic cardiomyopathy: Secondary | ICD-10-CM | POA: Insufficient documentation

## 2016-02-11 DIAGNOSIS — I252 Old myocardial infarction: Secondary | ICD-10-CM | POA: Diagnosis not present

## 2016-02-11 DIAGNOSIS — Z87891 Personal history of nicotine dependence: Secondary | ICD-10-CM | POA: Insufficient documentation

## 2016-02-11 DIAGNOSIS — I5022 Chronic systolic (congestive) heart failure: Secondary | ICD-10-CM | POA: Diagnosis not present

## 2016-02-11 DIAGNOSIS — K2950 Unspecified chronic gastritis without bleeding: Secondary | ICD-10-CM | POA: Insufficient documentation

## 2016-02-11 DIAGNOSIS — K2970 Gastritis, unspecified, without bleeding: Secondary | ICD-10-CM | POA: Insufficient documentation

## 2016-02-11 DIAGNOSIS — I493 Ventricular premature depolarization: Secondary | ICD-10-CM | POA: Insufficient documentation

## 2016-02-11 DIAGNOSIS — R1310 Dysphagia, unspecified: Secondary | ICD-10-CM | POA: Diagnosis not present

## 2016-02-11 DIAGNOSIS — I340 Nonrheumatic mitral (valve) insufficiency: Secondary | ICD-10-CM | POA: Insufficient documentation

## 2016-02-11 DIAGNOSIS — K2289 Other specified disease of esophagus: Secondary | ICD-10-CM | POA: Insufficient documentation

## 2016-02-11 DIAGNOSIS — Z6839 Body mass index (BMI) 39.0-39.9, adult: Secondary | ICD-10-CM | POA: Insufficient documentation

## 2016-02-11 DIAGNOSIS — K228 Other specified diseases of esophagus: Secondary | ICD-10-CM | POA: Diagnosis not present

## 2016-02-11 DIAGNOSIS — Z8371 Family history of colonic polyps: Secondary | ICD-10-CM | POA: Insufficient documentation

## 2016-02-11 DIAGNOSIS — I2510 Atherosclerotic heart disease of native coronary artery without angina pectoris: Secondary | ICD-10-CM | POA: Diagnosis not present

## 2016-02-11 DIAGNOSIS — K2930 Chronic superficial gastritis without bleeding: Secondary | ICD-10-CM | POA: Diagnosis not present

## 2016-02-11 DIAGNOSIS — I110 Hypertensive heart disease with heart failure: Secondary | ICD-10-CM | POA: Diagnosis not present

## 2016-02-11 DIAGNOSIS — Z800 Family history of malignant neoplasm of digestive organs: Secondary | ICD-10-CM | POA: Diagnosis not present

## 2016-02-11 DIAGNOSIS — Z87442 Personal history of urinary calculi: Secondary | ICD-10-CM | POA: Insufficient documentation

## 2016-02-11 DIAGNOSIS — E1140 Type 2 diabetes mellitus with diabetic neuropathy, unspecified: Secondary | ICD-10-CM | POA: Diagnosis not present

## 2016-02-11 DIAGNOSIS — Z823 Family history of stroke: Secondary | ICD-10-CM | POA: Insufficient documentation

## 2016-02-11 HISTORY — DX: Gastro-esophageal reflux disease without esophagitis: K21.9

## 2016-02-11 HISTORY — PX: ESOPHAGOGASTRODUODENOSCOPY (EGD) WITH PROPOFOL: SHX5813

## 2016-02-11 LAB — GLUCOSE, CAPILLARY
Glucose-Capillary: 134 mg/dL — ABNORMAL HIGH (ref 65–99)
Glucose-Capillary: 123 mg/dL — ABNORMAL HIGH (ref 65–99)

## 2016-02-11 SURGERY — ESOPHAGOGASTRODUODENOSCOPY (EGD) WITH PROPOFOL
Anesthesia: Monitor Anesthesia Care | Wound class: Clean Contaminated

## 2016-02-11 MED ORDER — STERILE WATER FOR IRRIGATION IR SOLN
Status: DC | PRN
  Administered 2016-02-11: 08:00:00

## 2016-02-11 MED ORDER — OXYCODONE HCL 5 MG PO TABS: 5.0000 mg | ORAL_TABLET | Freq: Once | ORAL | Status: DC | PRN

## 2016-02-11 MED ORDER — OXYCODONE HCL 5 MG/5ML PO SOLN: 5.0000 mg | Freq: Once | ORAL | Status: DC | PRN

## 2016-02-11 MED ORDER — GLYCOPYRROLATE 0.2 MG/ML IJ SOLN
INTRAMUSCULAR | Status: DC | PRN
  Administered 2016-02-11: 0.1 mg via INTRAVENOUS

## 2016-02-11 MED ORDER — LACTATED RINGERS IV SOLN
INTRAVENOUS | Status: DC
  Administered 2016-02-11: 07:00:00 via INTRAVENOUS

## 2016-02-11 MED ORDER — PROPOFOL 10 MG/ML IV BOLUS
INTRAVENOUS | Status: DC | PRN
  Administered 2016-02-11: 40 mg via INTRAVENOUS
  Administered 2016-02-11 (×2): 50 mg via INTRAVENOUS

## 2016-02-11 MED ORDER — LIDOCAINE HCL (CARDIAC) 20 MG/ML IV SOLN
INTRAVENOUS | Status: DC | PRN
  Administered 2016-02-11: 40 mg via INTRAVENOUS

## 2016-02-11 SURGICAL SUPPLY — 31 items
BALLN DILATOR 10-12 8 (BALLOONS)
BALLN DILATOR 12-15 8 (BALLOONS)
BALLN DILATOR 15-18 8 (BALLOONS)
BALLN DILATOR CRE 0-12 8 (BALLOONS)
BALLN DILATOR ESOPH 8 10 CRE (MISCELLANEOUS) IMPLANT
BALLOON DILATOR 12-15 8 (BALLOONS) IMPLANT
BALLOON DILATOR 15-18 8 (BALLOONS) IMPLANT
BALLOON DILATOR CRE 0-12 8 (BALLOONS) IMPLANT
BLOCK BITE 60FR ADLT L/F GRN (MISCELLANEOUS) ×3 IMPLANT
CANISTER SUCT 1200ML W/VALVE (MISCELLANEOUS) ×3 IMPLANT
CLIP HMST 235XBRD CATH ROT (MISCELLANEOUS) IMPLANT
CLIP RESOLUTION 360 11X235 (MISCELLANEOUS)
FCP ESCP3.2XJMB 240X2.8X (MISCELLANEOUS)
FORCEPS BIOP RAD 4 LRG CAP 4 (CUTTING FORCEPS) ×2 IMPLANT
FORCEPS BIOP RJ4 240 W/NDL (MISCELLANEOUS)
FORCEPS ESCP3.2XJMB 240X2.8X (MISCELLANEOUS) IMPLANT
GOWN CVR UNV OPN BCK APRN NK (MISCELLANEOUS) ×2 IMPLANT
GOWN ISOL THUMB LOOP REG UNIV (MISCELLANEOUS) ×6
INJECTOR VARIJECT VIN23 (MISCELLANEOUS) IMPLANT
KIT DEFENDO VALVE AND CONN (KITS) IMPLANT
KIT ENDO PROCEDURE OLY (KITS) ×3 IMPLANT
MARKER SPOT ENDO TATTOO 5ML (MISCELLANEOUS) IMPLANT
PAD GROUND ADULT SPLIT (MISCELLANEOUS) IMPLANT
SNARE SHORT THROW 13M SML OVAL (MISCELLANEOUS) IMPLANT
SNARE SHORT THROW 30M LRG OVAL (MISCELLANEOUS) IMPLANT
SPOT EX ENDOSCOPIC TATTOO (MISCELLANEOUS)
SYR INFLATION 60ML (SYRINGE) IMPLANT
TRAP ETRAP POLY (MISCELLANEOUS) IMPLANT
VARIJECT INJECTOR VIN23 (MISCELLANEOUS)
WATER STERILE IRR 250ML POUR (IV SOLUTION) ×3 IMPLANT
WIRE CRE 18-20MM 8CM F G (MISCELLANEOUS) IMPLANT

## 2016-02-11 NOTE — Op Note (Addendum)
[facility] Patient Name: [name] [name] Procedure Date: 02/11/2016 8:08 AM MRN: [id number] Account #: [id number] Date of Birth: 09/21/1940 Admit Type: Outpatient Age: 75 Room: [facility] OR ROOM 01 Gender: Male Note Status: Finalized Procedure:            Upper GI endoscopy Indications:          Dysphagia Providers:            [name], MD Referring MD:         [name], MD (Referring MD) Medicines:            Propofol per Anesthesia Complications:        No immediate complications. Procedure:            Pre-Anesthesia Assessment:                       - Prior to the procedure, a History and Physical was                        performed, and patient medications and allergies were                        reviewed. The patient's tolerance of previous                        anesthesia was also reviewed. The risks and benefits of                        the procedure and the sedation options and risks were                        discussed with the patient. All questions were                        answered, and informed consent was obtained. Prior                        Anticoagulants: The patient has taken no previous                        anticoagulant or antiplatelet agents. ASA Grade                        Assessment: II - A patient with mild systemic disease.                        After reviewing the risks and benefits, the patient was                        deemed in satisfactory condition to undergo the                        procedure.                       After obtaining informed consent, the endoscope was                        passed under direct vision. Throughout the procedure,  the patient's blood pressure, pulse, and oxygen                        saturations were monitored continuously. The was                        introduced through the mouth, and advanced to the                        second part of  duodenum. The upper GI endoscopy was                        accomplished without difficulty. The patient tolerated                        the procedure well. Findings:      The Z-line was irregular and was found at the gastroesophageal junction.      Localized moderate inflammation characterized by erythema was found in       the entire examined stomach. Biopsies were taken with a cold forceps for       histology.      The examined duodenum was normal.      The scope was withdrawn. Dilation was performed in the entire esophagus       with a Maloney dilator with no resistance at 52 Fr. Impression:           - Z-line irregular, at the gastroesophageal junction.                       - Gastritis. Biopsied.                       - Normal examined duodenum.                       - Dilation performed in the entire esophagus. Recommendation:       - Await pathology results. Procedure Code(s):    --- Professional ---                       [phone], Esophagogastroduodenoscopy, flexible, transoral;                        with biopsy, single or multiple                       43450, Dilation of esophagus, by unguided sound or                        bougie, single or multiple passes Diagnosis Code(s):    --- Professional ---                       R13.10, Dysphagia, unspecified                       K29.70, Gastritis, unspecified, without bleeding                       K22.8, Other specified diseases of esophagus CPT copyright 2016 [employer]. All rights reserved. The codes documented in this report are preliminary and upon coder review may  be revised to meet current compliance requirements.  [name], MD 02/11/2016 8:24:33 AM This report has been signed electronically. Number of Addenda: 0 Note Initiated On: 02/11/2016 8:08 AM Total Procedure Duration: 0 hours 4 minutes 10 seconds       [facility]

## 2016-02-11 NOTE — Transfer of Care (Signed)
Immediate Anesthesia Transfer of Care Note  Patient: [name] [name]  Procedure(s) Performed: Procedure(s) with comments: ESOPHAGOGASTRODUODENOSCOPY (EGD) WITH PROPOFOL with dialation (N/A) - diabetic, oral meds  Patient Location: PACU  Anesthesia Type: MAC  Level of Consciousness: awake, alert  and patient cooperative  Airway and Oxygen Therapy: Patient Spontanous Breathing and Patient connected to supplemental oxygen  Post-op Assessment: Post-op Vital signs reviewed, Patient's Cardiovascular Status Stable, Respiratory Function Stable, Patent Airway and No signs of Nausea or vomiting  Post-op Vital Signs: Reviewed and stable  Complications: No apparent anesthesia complications

## 2016-02-11 NOTE — H&P (Signed)
[name], MD [facility] [address], [address] [place], [place] [address] Phone: [phone] Fax : [phone]  Primary Care Physician:  [name], MD Primary Gastroenterologist:  Dr. Servando Snare  Pre-Procedure History & Physical: HPI:  [name] [name] is a 75 y.o. male is here for an endoscopy.   Past Medical History  Diagnosis Date  . Hypertension   . Hyperlipidemia   . Gout   . Diabetes mellitus without complication (HCC)   . Nephrolithiasis   . Umbilical hernia   . Epigastric hernia   . Obesity   . Chronic systolic CHF (congestive heart failure) (HCC)     a. EF 25-30% by 2D ECHO 04/20/14. Severely dec global hypokinesis. mildly elevated RVSP;  b. 12/2014 Echo: EF 40-45%, mod dil LA, mildly dil RV/RA;  c. 04/2015 Echo: EF 30-35%, sev antsept HK, Gr1 DD, mildly dil RA/LA;  d. 07/2015 Echo: EF 35-40%, ant/antsept HK, Gr1 DD, mildly red RV fxn.  [name] AAA (abdominal aortic aneurysm) (HCC)   . Ischemic cardiomyopathy     a. EF 25-30% 03/2014;  b. EF 40-45% 12/2014.  . Mitral regurgitation     a. 03/2014 TEE: mild to mod MR.  [name] Neuromuscular disorder (HCC)     DIABETIC NEUROPATHY  . Arthritis   . CAD (coronary artery disease)     a. 03/2014 NSTEMI--> 05/2014 CABG x 5 (LIMA->D1, VG->LAD, VG->OM1, VG->OM2, VG->RPDA);  c. 04/2015 Cath: LM nl, LAD 100ost, 40d, RI 80, LCX 80p/m, OM1 70, OM2 80, RCA 20p, 28m, 90d, VG->dLAD min irregs, VG->OM1 100, VG->OM2 100, VG->RPDA nl, LIMA->D2 nl.  . Frequent PVCs     a. 24 hr Holter 06/2015: >28K PVCs accounting for 25% of all beats, rare PAC-->mexilitene started;  b. 07/2015 Holter: 1700 PVC's/24 hrs.  . ACE inhibitor associated hyperkalemia 09/20/2015  . GERD (gastroesophageal reflux disease)     Past Surgical History  Procedure Laterality Date  . Rotator cuff repair Right 1996,1997,2000  . Carpal tunnel release Right   . Tee without cardioversion N/A 04/24/2014    Procedure: TRANSESOPHAGEAL ECHOCARDIOGRAM (TEE);  Surgeon: [name], MD;   Location: [facility] ENDOSCOPY;  Service: Cardiovascular;  Laterality: N/A;  . Cardiac catheterization      [facility]  . Coronary artery bypass graft N/A 06/06/2014    Procedure: CORONARY ARTERY BYPASS GRAFTING (CABG) x 5 USING LEFT AND RIGHT SAPHENOUS LEG VEIN HARVESTED ENDOSCOPICALLY;  Surgeon: Kerin Perna, MD;  Location: [facility] OR;  Service: Open Heart Surgery;  Laterality: N/A;  . Intraoperative transesophageal echocardiogram N/A 06/06/2014    Procedure: INTRAOPERATIVE TRANSESOPHAGEAL ECHOCARDIOGRAM;  Surgeon: Kerin Perna, MD;  Location: [facility] OR;  Service: Open Heart Surgery;  Laterality: N/A;  . Cardiac catheterization N/A 05/23/2015    Procedure: Left Heart Cath and Coronary Angiography;  Surgeon: Iran Ouch, MD;  Location: [facility] INVASIVE CV LAB;  Service: Cardiovascular;  Laterality: N/A;    Prior to Admission medications   Medication Sig Start Date End Date Taking? Authorizing Provider  aspirin EC 81 MG tablet Take 1 tablet (81 mg total) by mouth daily. 09/20/15  Yes Duke Salvia, MD  atorvastatin (LIPITOR) 40 MG tablet TAKE ONE TABLET BY MOUTH ONCE DAILY 09/07/15  Yes Iran Ouch, MD  carvedilol (COREG) 12.5 MG tablet Take 1 tablet (12.5 mg total) by mouth 2 (two) times daily. 09/20/15  Yes Duke Salvia, MD  colchicine 0.6 MG tablet Take 0.6 mg by mouth daily as needed (for gout flareup).    Yes Historical  Provider, MD  furosemide (LASIX) 40 MG tablet TAKE ONE TABLET BY MOUTH ONCE DAILY 08/30/15  Yes Iran Ouch, MD  gabapentin (NEURONTIN) 300 MG capsule Take 300 mg by mouth 4 (four) times daily.  01/02/14  Yes Historical Provider, MD  losartan (COZAAR) 25 MG tablet Take 1 tablet (25 mg total) by mouth daily. 11/26/15  Yes Iran Ouch, MD  mexiletine (MEXITIL) 150 MG capsule Take 1 capsule (150 mg total) by mouth 2 (two) times daily. 09/20/15  Yes Duke Salvia, MD  Misc Natural Products (BLACK CHERRY CONCENTRATE PO) Take by mouth daily.   Yes Historical Provider, MD  nitroGLYCERIN  (NITROSTAT) 0.4 MG SL tablet Place 0.4 mg under the tongue every 5 (five) minutes as needed for chest pain.   Yes Historical Provider, MD  omeprazole (PRILOSEC) 20 MG capsule Take 20 mg by mouth daily.   Yes Historical Provider, MD  TRADJENTA 5 MG TABS tablet  11/27/15  Yes Historical Provider, MD  ranitidine (ZANTAC) 150 MG tablet Take 150 mg by mouth daily. Reported on 02/11/2016    Historical Provider, MD    Allergies as of 02/05/2016  . (No Known Allergies)    Family History  Problem Relation Age of Onset  . Colon polyps Brother   . Lung cancer Brother   . Throat cancer Father   . Stroke Father     Social History   Social History  . Marital Status: Single    Spouse Name: N/A  . Number of Children: N/A  . Years of Education: N/A   Occupational History  . Not on file.   Social History Main Topics  . Smoking status: Former Smoker -- 1.00 packs/day for 25 years    Types: Cigarettes  . Smokeless tobacco: Former Neurosurgeon    Types: Chew     Comment: quit early 1990's  . Alcohol Use: No  . Drug Use: No  . Sexual Activity: Not on file   Other Topics Concern  . Not on file   Social History Narrative    Review of Systems: See HPI, otherwise negative ROS  Physical Exam: BP 145/71 mmHg  Pulse 71  Temp(Src) 98.2 F (36.8 C)  Resp 18  Ht 6' (1.829 m)  Wt 291 lb (131.997 kg)  BMI 39.46 kg/m2  SpO2 94% General:   Alert,  pleasant and cooperative in NAD Head:  Normocephalic and atraumatic. Neck:  Supple; no masses or thyromegaly. Lungs:  Clear throughout to auscultation.    Heart:  Regular rate and rhythm. Abdomen:  Soft, nontender and nondistended. Normal bowel sounds, without guarding, and without rebound.   Neurologic:  Alert and  oriented x4;  grossly normal neurologically.  Impression/Plan: [name] [name] is here for an endoscopy to be performed for Dysphagia  Risks, benefits, limitations, and alternatives regarding  endoscopy have been reviewed with the  patient.  Questions have been answered.  All parties agreeable.   [name], MD  02/11/2016, 7:39 AM

## 2016-02-11 NOTE — Anesthesia Postprocedure Evaluation (Signed)
Anesthesia Post Note  Patient: [name] [name]  Procedure(s) Performed: Procedure(s) (LRB): ESOPHAGOGASTRODUODENOSCOPY (EGD) WITH PROPOFOL with dialation (N/A)  Patient location during evaluation: PACU Anesthesia Type: MAC Level of consciousness: awake and alert Pain management: pain level controlled Vital Signs Assessment: post-procedure vital signs reviewed and stable Respiratory status: spontaneous breathing, nonlabored ventilation, respiratory function stable and patient connected to nasal cannula oxygen Cardiovascular status: stable and blood pressure returned to baseline Anesthetic complications: no    Haneefah Venturini

## 2016-02-11 NOTE — Anesthesia Preprocedure Evaluation (Addendum)
Anesthesia Evaluation  Patient identified by MRN, date of birth, ID band Patient awake    Reviewed: Allergy & Precautions, H&P , NPO status , Patient's Chart, lab work & pertinent test results, reviewed documented beta blocker date and time   History of Anesthesia Complications Negative for: history of anesthetic complications  Airway Mallampati: II  TM Distance: >3 FB Neck ROM: Full    Dental no notable dental hx. (+) Teeth Intact   Pulmonary neg pulmonary ROS, former smoker,    Pulmonary exam normal        Cardiovascular Exercise Tolerance: Good hypertension, + CAD, + Past MI (1971), + CABG (2015) and +CHF (ef=35-40%)  negative cardio ROS Normal cardiovascular exam+ dysrhythmias (PVCs > on medication)   Abdominal aortic aneurysm;   Neuro/Psych Neuropathy feet  negative psych ROS   GI/Hepatic Neg liver ROS, GERD  Controlled,  Endo/Other  diabetes, Type 2, Oral Hypoglycemic AgentsMorbid obesity (bmi=39)  Renal/GU negative Renal ROS  negative genitourinary   Musculoskeletal  (+) Arthritis , gout   Abdominal   Peds  Hematology negative hematology ROS (+)   Anesthesia Other Findings ekg: nsr;  echo: 07/2015:  Left ventricle: The cavity size was moderately dilated. Systolic  function was moderately reduced. The estimated ejection fraction  was in the range of 35% to 40%. Hypokinesis of the anterior  myocardium. Hypokinesis of the anteroseptal myocardium. Doppler  parameters are consistent with abnormal left ventricular  relaxation.  cath: 04/2015: The left circumflex territory is overall not large and the vessel is diffusely diseased. Medical therapy is probably the best option. The patient is also noted to have frequent PVCs which might be contributing to his cardiomyopathy.  cards stable: 11/2015: dr. Kirke Corin;  Reproductive/Obstetrics                             Anesthesia  Physical Anesthesia Plan  ASA: III  Anesthesia Plan: MAC   Post-op Pain Management:    Induction:   Airway Management Planned:   Additional Equipment:   Intra-op Plan:   Post-operative Plan:   Informed Consent: I have reviewed the patients History and Physical, chart, labs and discussed the procedure including the risks, benefits and alternatives for the proposed anesthesia with the patient or authorized representative who has indicated his/her understanding and acceptance.     Plan Discussed with: CRNA  Anesthesia Plan Comments:         Anesthesia Quick Evaluation

## 2016-02-11 NOTE — Telephone Encounter (Signed)
Error   This encounter was created in error - please disregard. 

## 2016-02-12 ENCOUNTER — Encounter: Payer: Self-pay | Admitting: Gastroenterology

## 2016-02-13 ENCOUNTER — Encounter: Payer: Self-pay | Admitting: Gastroenterology

## 2016-02-14 ENCOUNTER — Encounter: Payer: Self-pay | Admitting: Gastroenterology

## 2016-02-28 ENCOUNTER — Encounter: Payer: Self-pay | Admitting: Cardiovascular Disease

## 2016-02-28 ENCOUNTER — Ambulatory Visit (INDEPENDENT_AMBULATORY_CARE_PROVIDER_SITE_OTHER): Payer: PPO | Admitting: Cardiovascular Disease

## 2016-02-28 VITALS — BP 138/73 | HR 76 | Ht 70.0 in | Wt 291.8 lb

## 2016-02-28 DIAGNOSIS — I493 Ventricular premature depolarization: Secondary | ICD-10-CM

## 2016-02-28 DIAGNOSIS — I5022 Chronic systolic (congestive) heart failure: Secondary | ICD-10-CM

## 2016-02-28 NOTE — Patient Instructions (Signed)
Medication Instructions: Continue same medications.   Labwork: Continue same medications.   Procedures/Testing: None.   Follow-Up: 3 months with Dr. Kirke Corin.   Any Additional Special Instructions Will Be Listed Below (If Applicable).     If you need a refill on your cardiac medications before your next appointment, please call your pharmacy.

## 2016-02-28 NOTE — Progress Notes (Signed)
Cardiology Office Note   Date:  02/28/2016   ID:  [name] [name], DOB October 27, 1940, MRN [id number]  PCP:  [name], MD  Cardiologist:   [name], MD   Chief Complaint  Patient presents with  . other    3 month f/u. Meds reviewed verbally with pt.      History of Present Illness: [name] [name] is a 75 y.o. male who presents for a follow-up visit regarding coronary artery disease, chronic systolic heart failure and frequent PVCs. He underwent CABG x 5 in 05/2014 after presenting with non-ST elevation myocardial infarction and heart failure. EF pre-CABG was 25-30%. F/U echo in 12/2014 showed EF of 40-45%. In September 2016, he complained of worsening dyspnea and follow-up echo showed reduction in LV function to 30-35%. He was not significantly volume overloaded. He underwent repeat catheterization revealing 3 of 5 patent grafts with new occlusions of the vein grafts to the OM1 and OM 2. The native obtuse marginals were small and not felt to be amenable to PCI. Medical therapy was recommended. He was noted during catheterization have frequent PVCs and subsequent or a Holter monitor which revealed 28,000 PVCs in 24 hours, accounting for 25% of all of his beats. It was felt that PVC burden may be contracting to his cardiomyopathy and he was subsequently placed on mexiletine therapy. Follow-up Holter monitoring showed significant improvement in PVC burden, now down to 1724 hours. Follow-up echocardiography showed modestly improved LV function, now with an EF of 35-40%.  The dose of mexiletine was subsequently decreased due to GI symptoms and overall he has been doing well. Lisinopril was discontinued in the past due to hyperkalemia But he has been tolerating losartan. Overall, he has been doing reasonably well and reports improvement in dyspnea and leg edema. He denies any chest pain. He is significantly better since controlling his PVCs.    Past Medical History  Diagnosis  Date  . Hypertension   . Hyperlipidemia   . Gout   . Diabetes mellitus without complication (HCC)   . Nephrolithiasis   . Umbilical hernia   . Epigastric hernia   . Obesity   . Chronic systolic CHF (congestive heart failure) (HCC)     a. EF 25-30% by 2D ECHO 04/20/14. Severely dec global hypokinesis. mildly elevated RVSP;  b. 12/2014 Echo: EF 40-45%, mod dil LA, mildly dil RV/RA;  c. 04/2015 Echo: EF 30-35%, sev antsept HK, Gr1 DD, mildly dil RA/LA;  d. 07/2015 Echo: EF 35-40%, ant/antsept HK, Gr1 DD, mildly red RV fxn.  [name] AAA (abdominal aortic aneurysm) (HCC)   . Ischemic cardiomyopathy     a. EF 25-30% 03/2014;  b. EF 40-45% 12/2014.  . Mitral regurgitation     a. 03/2014 TEE: mild to mod MR.  [name] Neuromuscular disorder (HCC)     DIABETIC NEUROPATHY  . Arthritis   . CAD (coronary artery disease)     a. 03/2014 NSTEMI--> 05/2014 CABG x 5 (LIMA->D1, VG->LAD, VG->OM1, VG->OM2, VG->RPDA);  c. 04/2015 Cath: LM nl, LAD 100ost, 40d, RI 80, LCX 80p/m, OM1 70, OM2 80, RCA 20p, 74m, 90d, VG->dLAD min irregs, VG->OM1 100, VG->OM2 100, VG->RPDA nl, LIMA->D2 nl.  . Frequent PVCs     a. 24 hr Holter 06/2015: >28K PVCs accounting for 25% of all beats, rare PAC-->mexilitene started;  b. 07/2015 Holter: 1700 PVC's/24 hrs.  . ACE inhibitor associated hyperkalemia 09/20/2015  . GERD (gastroesophageal reflux disease)     Past Surgical History  Procedure Laterality  Date  . Rotator cuff repair Right [phone]  . Carpal tunnel release Right   . Tee without cardioversion N/A 04/24/2014    Procedure: TRANSESOPHAGEAL ECHOCARDIOGRAM (TEE);  Surgeon: [name], MD;  Location: [facility] ENDOSCOPY;  Service: Cardiovascular;  Laterality: N/A;  . Cardiac catheterization      [facility]  . Coronary artery bypass graft N/A 06/06/2014    Procedure: CORONARY ARTERY BYPASS GRAFTING (CABG) x 5 USING LEFT AND RIGHT SAPHENOUS LEG VEIN HARVESTED ENDOSCOPICALLY;  Surgeon: Kerin Perna, MD;  Location: [facility] OR;  Service: Open Heart  Surgery;  Laterality: N/A;  . Intraoperative transesophageal echocardiogram N/A 06/06/2014    Procedure: INTRAOPERATIVE TRANSESOPHAGEAL ECHOCARDIOGRAM;  Surgeon: Kerin Perna, MD;  Location: [facility] OR;  Service: Open Heart Surgery;  Laterality: N/A;  . Cardiac catheterization N/A 05/23/2015    Procedure: Left Heart Cath and Coronary Angiography;  Surgeon: Iran Ouch, MD;  Location: [facility] INVASIVE CV LAB;  Service: Cardiovascular;  Laterality: N/A;  . Esophagogastroduodenoscopy (egd) with propofol N/A 02/11/2016    Procedure: ESOPHAGOGASTRODUODENOSCOPY (EGD) WITH PROPOFOL with dialation;  Surgeon: [name], MD;  Location: [facility];  Service: Endoscopy;  Laterality: N/A;  diabetic, oral meds     Current Outpatient Prescriptions  Medication Sig Dispense Refill  . aspirin EC 81 MG tablet Take 1 tablet (81 mg total) by mouth daily.    [name] atorvastatin (LIPITOR) 40 MG tablet TAKE ONE TABLET BY MOUTH ONCE DAILY 90 tablet 3  . carvedilol (COREG) 12.5 MG tablet Take 1 tablet (12.5 mg total) by mouth 2 (two) times daily.    . colchicine 0.6 MG tablet Take 0.6 mg by mouth daily as needed (for gout flareup).     . furosemide (LASIX) 40 MG tablet TAKE ONE TABLET BY MOUTH ONCE DAILY 30 tablet 6  . gabapentin (NEURONTIN) 300 MG capsule Take 300 mg by mouth 4 (four) times daily.     [name] losartan (COZAAR) 25 MG tablet Take 1 tablet (25 mg total) by mouth daily. 30 tablet 3  . mexiletine (MEXITIL) 150 MG capsule Take 1 capsule (150 mg total) by mouth 2 (two) times daily. 60 capsule 6  . Misc Natural Products (BLACK CHERRY CONCENTRATE PO) Take by mouth daily.    . nitroGLYCERIN (NITROSTAT) 0.4 MG SL tablet Place 0.4 mg under the tongue every 5 (five) minutes as needed for chest pain.    [name] omeprazole (PRILOSEC) 20 MG capsule Take 20 mg by mouth daily.    . TRADJENTA 5 MG TABS tablet 5 mg daily.      No current facility-administered medications for this visit.    Allergies:   Review of patient's  allergies indicates no known allergies.    Social History:  The patient  reports that he has quit smoking. His smoking use included Cigarettes. He has a 25 pack-year smoking history. He has quit using smokeless tobacco. His smokeless tobacco use included Chew. He reports that he does not drink alcohol or use illicit drugs.   Family History:  The patient's family history includes Colon polyps in his brother; Lung cancer in his brother; Stroke in his father; Throat cancer in his father.    ROS:  Please see the history of present illness.   Otherwise, review of systems are positive for none.   All other systems are reviewed and negative.    PHYSICAL EXAM: VS:  BP 138/73 mmHg  Pulse 76  Ht 5\' 10"  (1.778 m)  Wt 291 lb 12 oz (132.337  kg)  BMI 41.86 kg/m2 , BMI Body mass index is 41.86 kg/(m^2). GEN: Well nourished, well developed, in no acute distress HEENT: normal Neck: no JVD, carotid bruits, or masses Cardiac: RRR; no murmurs, rubs, or gallops. Trace leg edema  Respiratory:  clear to auscultation bilaterally, normal work of breathing GI: soft, nontender, nondistended, + BS MS: no deformity or atrophy Skin: warm and dry, no rash Neuro:  Strength and sensation are intact Psych: euthymic mood, full affect   EKG:  EKG is not ordered today.    Recent Labs: 05/08/2015: BNP 119.6* 05/15/2015: Platelets 220 07/17/2015: Magnesium 2.2 12/04/2015: BUN 29*; Creatinine, Ser 1.56*; Potassium 5.0; Sodium 142    Lipid Panel    Component Value Date/Time   CHOL 95* 12/25/2014 1215   CHOL 88 04/24/2014 0407   TRIG 129 12/25/2014 1215   HDL 25* 12/25/2014 1215   HDL 23* 04/24/2014 0407   CHOLHDL 3.8 12/25/2014 1215   CHOLHDL 3.8 04/24/2014 0407   VLDL 23 04/24/2014 0407   LDLCALC 44 12/25/2014 1215   LDLCALC 42 04/24/2014 0407      Wt Readings from Last 3 Encounters:  02/28/16 291 lb 12 oz (132.337 kg)  02/11/16 291 lb (131.997 kg)  12/09/15 290 lb (131.543 kg)       ASSESSMENT  AND PLAN:  1.  Chronic systolic heart failure: The patient's ejection fraction improved after treating his frequent PVCs. He appears to be euvolemic on current dose of furosemide. Continue carvedilol.  He tolerated treatment with losartan with a follow-up basic metabolic profile that showed a potassium level of 5. Given his prior history of hyperkalemia with lisinopril, I'm not going to increase the dose at the present time and for the same reason spironolactone should not be added. If blood pressure continues to be somewhat on the high side, I will consider increasing the dose of carvedilol to 25 mg twice daily.  2. Symptomatic PVCs: Significant improvement with mexiletine which is currently tolerated at the current dose. He follows up with Dr. Graciela Husbands.  3. Coronary artery disease: Status post CABG: He has no anginal symptoms at the present time.  4. Essential hypertension: Blood pressure is borderline elevated.  5. Hyperlipidemia: Continue treatment with atorvastatin. Most recent LDL was 44.      Disposition:   FU with me in 3 months  Signed,  [name], MD  02/28/2016 10:09 AM    [facility]

## 2016-02-29 ENCOUNTER — Encounter: Payer: Self-pay | Admitting: Cardiovascular Disease

## 2016-03-03 ENCOUNTER — Ambulatory Visit (INDEPENDENT_AMBULATORY_CARE_PROVIDER_SITE_OTHER): Payer: PPO | Admitting: Family Medicine

## 2016-03-03 ENCOUNTER — Encounter: Payer: Self-pay | Admitting: Family Medicine

## 2016-03-03 VITALS — BP 128/72 | HR 80 | Temp 98.1°F | Ht 71.0 in | Wt 292.0 lb

## 2016-03-03 DIAGNOSIS — M545 Low back pain, unspecified: Secondary | ICD-10-CM

## 2016-03-03 DIAGNOSIS — K429 Umbilical hernia without obstruction or gangrene: Secondary | ICD-10-CM | POA: Diagnosis not present

## 2016-03-03 DIAGNOSIS — I2510 Atherosclerotic heart disease of native coronary artery without angina pectoris: Secondary | ICD-10-CM

## 2016-03-03 DIAGNOSIS — E1142 Type 2 diabetes mellitus with diabetic polyneuropathy: Secondary | ICD-10-CM | POA: Diagnosis not present

## 2016-03-03 DIAGNOSIS — R42 Dizziness and giddiness: Secondary | ICD-10-CM | POA: Diagnosis not present

## 2016-03-03 MED ORDER — RELION LANCETS STANDARD 21G MISC: Status: DC

## 2016-03-03 MED ORDER — RELION CONFIRM GLUCOSE MONITOR W/DEVICE KIT: PACK | Status: DC

## 2016-03-03 MED ORDER — GLUCOSE BLOOD VI STRP: ORAL_STRIP | Status: DC

## 2016-03-03 NOTE — Assessment & Plan Note (Signed)
No appreciated have hernia on exam today. He does have rectus diastasis. Discussed return precautions with patient. We will continue to monitor.

## 2016-03-03 NOTE — Progress Notes (Signed)
Patient ID: [name] [name], male   DOB: 1940-12-21, 75 y.o.   MRN: [id number]  [name] Rumps, MD Phone: [phone]  [name] [name] is a 75 y.o. male who presents today for new patient visit.  Dizziness: Patient notes over the weekend he had an episode of vertigo with the room spinning. Had some chills with this. Notes this happened after eating some spoiled ham. Notes are resolved by Saturday night. Was not lightheaded. No numbness or weakness. No nausea or vomiting. No diarrhea. No abdominal pain. No chest pain. No shortness of breath. Palpitations. His wife does note this is happened before after starting trajenta. This happened a few times previously. Never had prior to starting trajenta. They've never checked his blood sugar when this occurs.  DIABETES Disease Monitoring: Blood Sugar ranges-100-180s Polyuria/phagia/dipsia- no      Visual problems- no Medications: Compliance- taking trajenta Hypoglycemic symptoms- possibly, see above Has decreased his sweet tea intake. No soda intake. Increasing water. Walks daily for exercise.  Umbilical hernia: Notes this is been there a long time. Saw surgeon previously for this. Advised no surgical intervention. Has bowel movements most days. Notes it does not typically pop out. Does have rectus diastases.  CAD: Followed by cardiology. Last catheterization was 1-2 years ago. Status post bypass surgery. No chest pain or shortness of breath. Stable on current medications.  Does have a history of low back pain. Notices occasionally hurts him. No radiation to his legs. No numbness or weakness. No loss of bowel or bladder function, saddle anesthesia, fevers, or history of cancer.   Active Ambulatory Problems    Diagnosis Date Noted  . Diastasis, muscle 01/12/2014  . Umbilical hernia 93/57/0177  . Epigastric hernia 01/12/2014  . Morbid obesity ([place]) 01/12/2014  . Hyperlipidemia   . Hypertension   . CAD (coronary artery disease)   . Chronic  systolic CHF (congestive heart failure) ([place])   . Obesity   . Gout   . 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 Right foot ulcer ([place]) 01/16/2015  . AAA (abdominal aortic aneurysm) ([place]) 04/19/2015  . Coronary artery disease involving native coronary artery with other forms of angina pectoris ([place])   . Cardiomyopathy, ischemic 06/13/2015  . ACE inhibitor associated hyperkalemia 09/20/2015  . Weakness of hand -bilateral 09/20/2015  . Problems with swallowing and mastication   . Gastritis   . Other specified diseases of esophagus   . Type 2 diabetes mellitus with diabetic polyneuropathy, without long-term current use of insulin ([place]) 03/03/2016  . Vertigo 03/03/2016  . Low back pain 03/03/2016   Resolved Ambulatory Problems    Diagnosis Date Noted  . NSTEMI (non-ST elevated myocardial infarction) ([place]) 04/21/2014  . Diabetes mellitus without complication ([place])   . Acute systolic CHF (congestive heart failure), NYHA class 3 ([place]) 04/24/2014   Past Medical History  Diagnosis Date  . Nephrolithiasis   . Ischemic cardiomyopathy   . Mitral regurgitation   . Neuromuscular disorder ([place])   . Arthritis   . GERD (gastroesophageal reflux disease)     Family History  Problem Relation Age of Onset  . Colon polyps Brother   . Lung cancer Brother   . Throat cancer Father   . Stroke Father     Social History   Social History  . Marital Status: Single    Spouse Name: N/A  . Number of Children: N/A  . Years of Education: N/A   Occupational History  . Not on file.   Social History Main Topics  .  Smoking status: Former Smoker -- 1.00 packs/day for 25 years    Types: Cigarettes  . Smokeless tobacco: Former Systems developer    Types: Chew     Comment: quit early 1990's  . Alcohol Use: No  . Drug Use: No  . Sexual Activity: Not on file   Other Topics Concern  . Not on file   Social History Narrative    ROS  General:  Negative for nexplained weight loss, fever Skin: Negative for new or changing  mole, sore that won't heal HEENT: Negative for trouble hearing, trouble seeing, ringing in ears, mouth sores, hoarseness, change in voice, dysphagia. CV:  Negative for chest pain, dyspnea, edema, palpitations Resp: Negative for cough, dyspnea, hemoptysis GI: Negative for nausea, vomiting, diarrhea, constipation, abdominal pain, melena, hematochezia. GU: Negative for dysuria, incontinence, urinary hesitance, hematuria, vaginal or penile discharge, polyuria, sexual difficulty, lumps in testicle or breasts MSK: Negative for muscle cramps or aches, joint pain or swelling Neuro: Positive for dizziness, Negative for headaches, weakness, numbness, dizziness, passing out/fainting Psych: Negative for depression, anxiety, memory problems  Objective  Physical Exam Filed Vitals:   03/03/16 1416  BP: 128/72  Pulse: 80  Temp: 98.1 F (36.7 C)    BP Readings from Last 3 Encounters:  03/03/16 128/72  02/28/16 138/73  02/11/16 123/76   Wt Readings from Last 3 Encounters:  03/03/16 292 lb (132.45 kg)  02/28/16 291 lb 12 oz (132.337 kg)  02/11/16 291 lb (131.997 kg)    Physical Exam  Constitutional: No distress.  HENT:  Head: Normocephalic and atraumatic.  Eyes: Conjunctivae are normal. Pupils are equal, round, and reactive to light.  Cardiovascular: Normal rate, regular rhythm and normal heart sounds.   Trace edema lower extremities  Pulmonary/Chest: Effort normal and breath sounds normal.  Abdominal: Soft. Bowel sounds are normal. He exhibits no distension. There is no tenderness. There is no rebound and no guarding.  No palpable hernias  Musculoskeletal:  No midline spine tenderness, no midline spine step-off, no muscular back tenderness  Neurological: He is alert. Gait normal.  CN 2-12 intact, 5/5 strength in bilateral biceps, triceps, grip, quads, hamstrings, plantar and dorsiflexion, sensation to light touch intact in bilateral UE and LE, normal gait, 2+ patellar reflexes, negative  Romberg  Skin: Skin is warm and dry. He is not diaphoretic.  Psychiatric: Mood and affect normal.     Assessment/Plan:   CAD (coronary artery disease) Followed by cardiology. Asymptomatic. Continue current medications. Continue to follow with cardiology.  Type 2 diabetes mellitus with diabetic polyneuropathy, without long-term current use of insulin ([place]) Patient with diabetes. Last A1c appears to been 7.7. Started on trajenta at that time. Wife notes that he has had some episodes of dizziness and chills since starting this. I wonder if his symptoms could be related to hypoglycemia and though they have not checked his sugar when this occurs. We will check an A1c and a CMP today. I advised that if he has symptoms like his previous symptoms that should check his sugar so that we know if he is hypoglycemic. We may need to consider changing the trajenta to an alternative medication.  Umbilical hernia No appreciated have hernia on exam today. He does have rectus diastasis. Discussed return precautions with patient. We will continue to monitor.  Vertigo Patient with single episode of vertigo over the weekend. Has not persisted. Noted some chills. Potentially could be related to hypoglycemia. Patient believes could be related to the spoiled food he ate and this  is possible. Could be inner ear issue. Doubt central nervous system cause given he is neurologically intact today. See diabetes problem for management of that issue. He will throw the spoiled meat out. He will continue to monitor. If recurs he will let us know. Given return precautions.  Low back pain Chronic issue. No red flags. Neurologically intact in lower extremities. He will continue to monitor. Given return precautions.    Orders Placed This Encounter  Procedures  . HgB A1c  . Comp Met (CMET)    Meds ordered this encounter  Medications  . glucose blood (RELION GLUCOSE TEST STRIPS) test strip    Sig: Check your blood sugar  fasting daily and prior to dinner    Dispense:  100 each    Refill:  12  . Blood Glucose Monitoring Suppl (RELION CONFIRM GLUCOSE MONITOR) w/Device KIT    Sig: Check your blood sugar fasting daily and prior to dinner    Dispense:  1 kit    Refill:  0  . RELION LANCETS STANDARD 21G MISC    Sig: Check your blood sugar fasting daily and prior to dinner    Dispense:  200 each    Refill:  Jensen, MD Drew

## 2016-03-03 NOTE — Assessment & Plan Note (Signed)
Chronic issue. No red flags. Neurologically intact in lower extremities. He will continue to monitor. Given return precautions.

## 2016-03-03 NOTE — Assessment & Plan Note (Signed)
Patient with single episode of vertigo over the weekend. Has not persisted. Noted some chills. Potentially could be related to hypoglycemia. Patient believes could be related to the spoiled food he ate and this is possible. Could be inner ear issue. Doubt central nervous system cause given he is neurologically intact today. See diabetes problem for management of that issue. He will throw the spoiled meat out. He will continue to monitor. If recurs he will let us know. Given return precautions.

## 2016-03-03 NOTE — Assessment & Plan Note (Signed)
Followed by cardiology. Asymptomatic. Continue current medications. Continue to follow with cardiology.

## 2016-03-03 NOTE — Progress Notes (Signed)
Pre visit review using our clinic review tool, if applicable. No additional management support is needed unless otherwise documented below in the visit note. 

## 2016-03-03 NOTE — Assessment & Plan Note (Signed)
Patient with diabetes. Last A1c appears to been 7.7. Started on trajenta at that time. Wife notes that he has had some episodes of dizziness and chills since starting this. I wonder if his symptoms could be related to hypoglycemia and though they have not checked his sugar when this occurs. We will check an A1c and a CMP today. I advised that if he has symptoms like his previous symptoms that should check his sugar so that we know if he is hypoglycemic. We may need to consider changing the trajenta to an alternative medication.

## 2016-03-03 NOTE — Patient Instructions (Signed)
Nice to see you. Your dizziness could've been related to low blood sugar, spoiled food intake, or in her ear issue. You should check her sugar Gaynell Face this occurs. If it persists he should seek medical attention. We will check some lab work and call with the results. If you develop chest pain, shortness of breath, numbness, weakness, vision changes, persistent vertigo, or any new or changing symptoms please seek medical attention.

## 2016-03-04 LAB — HEMOGLOBIN A1C: Hgb A1c MFr Bld: 7.1 % — ABNORMAL HIGH (ref 4.6–6.5)

## 2016-03-04 LAB — COMPREHENSIVE METABOLIC PANEL
ALBUMIN: 3.9 g/dL (ref 3.5–5.2)
ALK PHOS: 81 U/L (ref 39–117)
ALT: 15 U/L (ref 0–53)
AST: 18 U/L (ref 0–37)
BILIRUBIN TOTAL: 0.6 mg/dL (ref 0.2–1.2)
BUN: 28 mg/dL — ABNORMAL HIGH (ref 6–23)
CALCIUM: 9.3 mg/dL (ref 8.4–10.5)
CO2: 31 meq/L (ref 19–32)
CREATININE: 1.52 mg/dL — AB (ref 0.40–1.50)
Chloride: 99 mEq/L (ref 96–112)
GFR: 48.05 mL/min — AB (ref >60.00)
Glucose, Bld: 106 mg/dL — ABNORMAL HIGH (ref 70–99)
Potassium: 4.2 mEq/L (ref 3.5–5.1)
SODIUM: 138 meq/L (ref 135–145)
Total Protein: 7.4 g/dL (ref 6.0–8.3)

## 2016-03-20 ENCOUNTER — Other Ambulatory Visit: Payer: Self-pay | Admitting: Cardiovascular Disease

## 2016-03-20 ENCOUNTER — Telehealth: Payer: Self-pay | Admitting: *Deleted

## 2016-03-20 NOTE — Telephone Encounter (Signed)
Please advise, thanks.

## 2016-03-20 NOTE — Telephone Encounter (Signed)
Patient was advised to call the office once his Rx for Linagliptin was completed. Dr Birdie Sons was to write a different  Rx for pt. Dur to cost. Pharmacy [facility] [address]  Pt contact [phone]

## 2016-03-20 NOTE — Telephone Encounter (Signed)
Please see if the patient would like to start on Jardiance. Per the last result noted appears that he wanted to remain on tradjenta until his next office visit though I'm happy to change this at this time. Thanks

## 2016-03-20 NOTE — Telephone Encounter (Signed)
Attempted to reach patient, not able to leave a message. Will try again. thanks

## 2016-03-21 MED ORDER — EMPAGLIFLOZIN 10 MG PO TABS: 10.0000 mg | ORAL_TABLET | Freq: Every day | ORAL | 3 refills | Status: DC

## 2016-03-21 NOTE — Telephone Encounter (Signed)
The patient is wanting a lower cost Diabetes medication the last two have been 45.00 and he wants to take something with a lower cost. He stated that he was controlled under a previous medication that was at a lower cost given to him by Dr. Delia Chimes at [facility].

## 2016-03-21 NOTE — Telephone Encounter (Signed)
Prescription sent to pharmacy. Patient's GFR is greater than 45. It is 48. We will need to recheck his kidney function in about a month.

## 2016-03-21 NOTE — Telephone Encounter (Signed)
Patient comes in 04/04/16 can repeat labs then.

## 2016-03-21 NOTE — Telephone Encounter (Signed)
Noted  

## 2016-03-21 NOTE — Telephone Encounter (Signed)
I gave Erie Noe a coupon to give to the patient to get it at a cheaper cost.

## 2016-03-21 NOTE — Telephone Encounter (Signed)
Patient came to the office today, requesting change in medication, CMA spoke with the patient and advised Provider, thanks

## 2016-03-21 NOTE — Telephone Encounter (Signed)
Patient came into the office because he is going to be out of his Tradjenta on Monday. He has decided that he would like to start the [place]. Please send to [facility].

## 2016-03-25 ENCOUNTER — Other Ambulatory Visit: Payer: Self-pay | Admitting: Cardiovascular Disease

## 2016-04-03 IMAGING — US US RETROPERITONEAL COMPLETE
1 series · 14 of 25 positions shown · non-contrast
Comparison: [DATE]

CLINICAL DATA: Followup abdominal aortic aneurysm without rupture

EXAM:
ULTRASOUND RETROPERITONEAL COMPLETE
TECHNIQUE: Ultrasound examination of the abdominal aorta was performed to
evaluate for abdominal aortic aneurysm. The common iliac arteries,
IVC, and kidneys were also evaluated.

[Series 1: us retroperitoneal complete · 0.40mm/px · 14 of 56 slices shown]
[im 1/56]
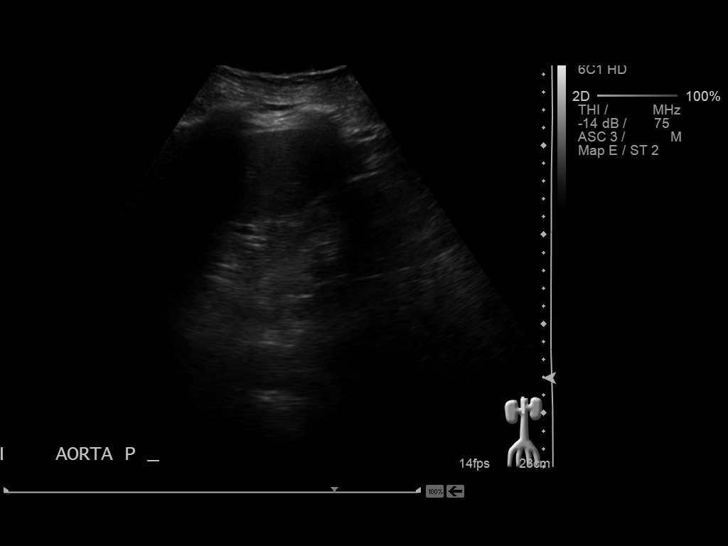
[im 5/56]
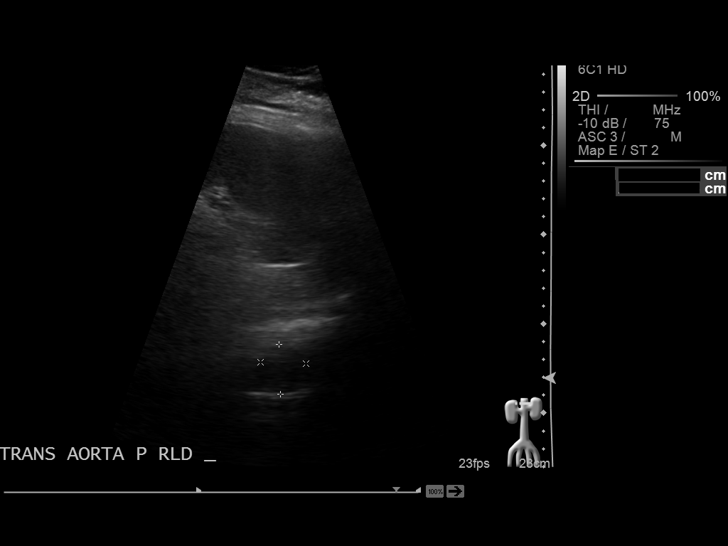
[im 10/56]
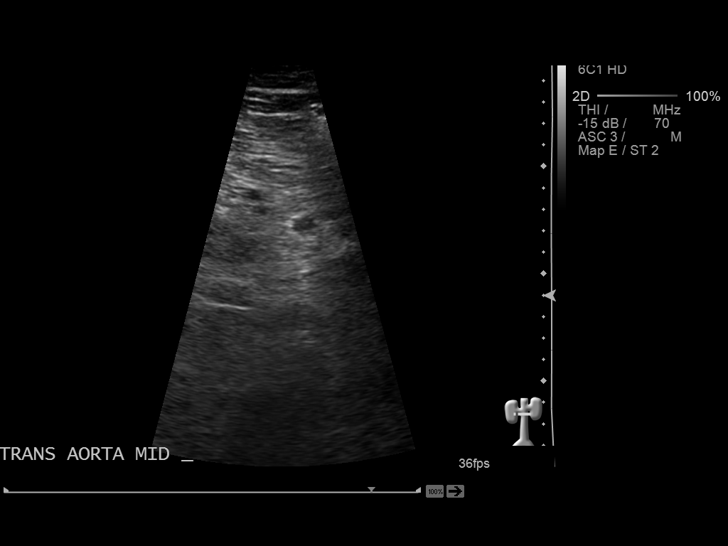
[im 14/56]
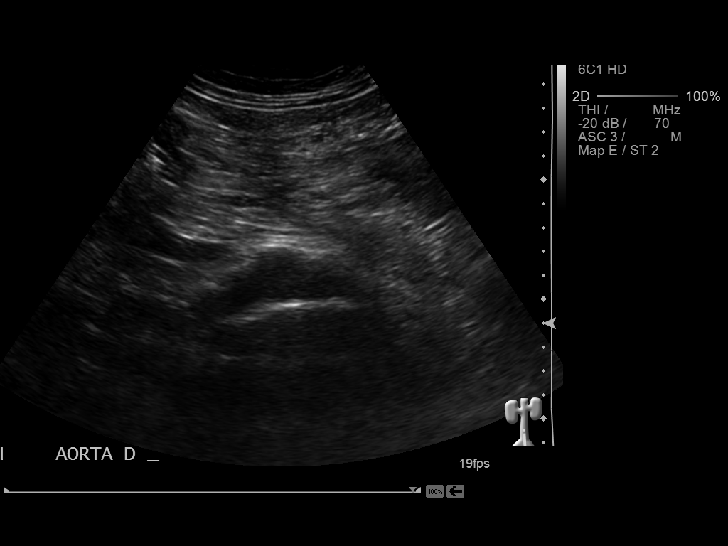
[im 19/56]
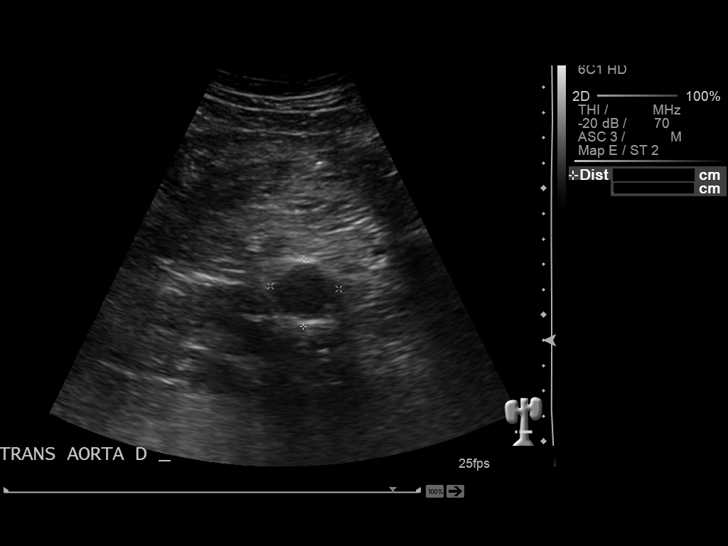
[im 21/56]
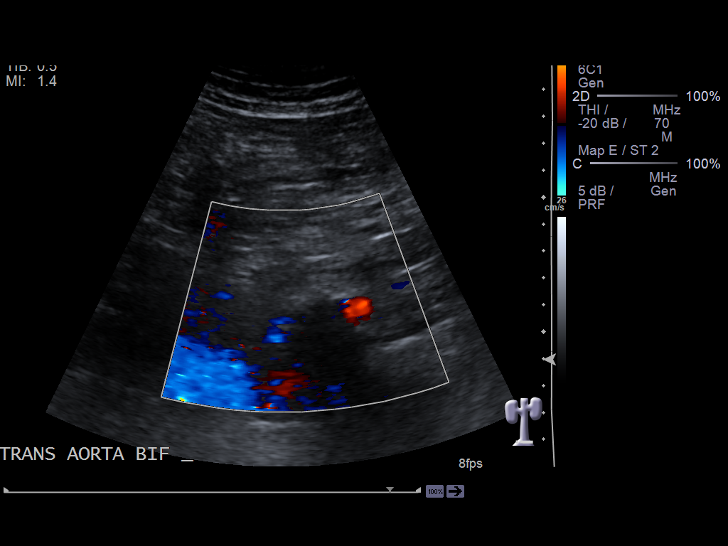
[im 26/56]
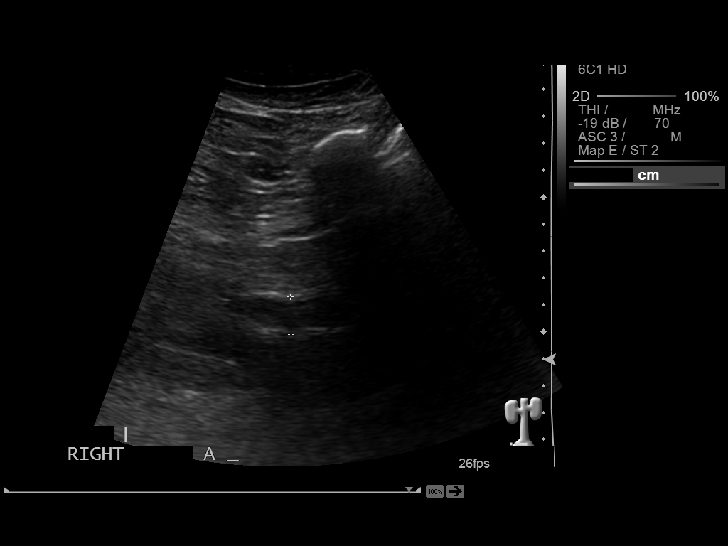
[im 30/56]
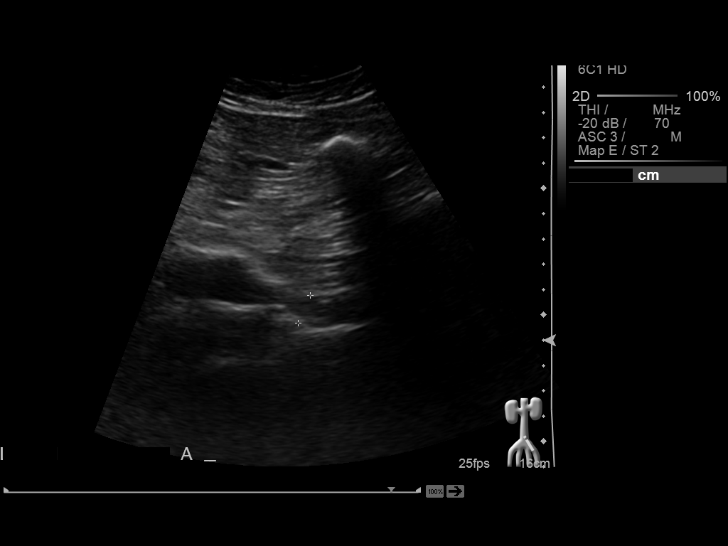
[im 35/56]
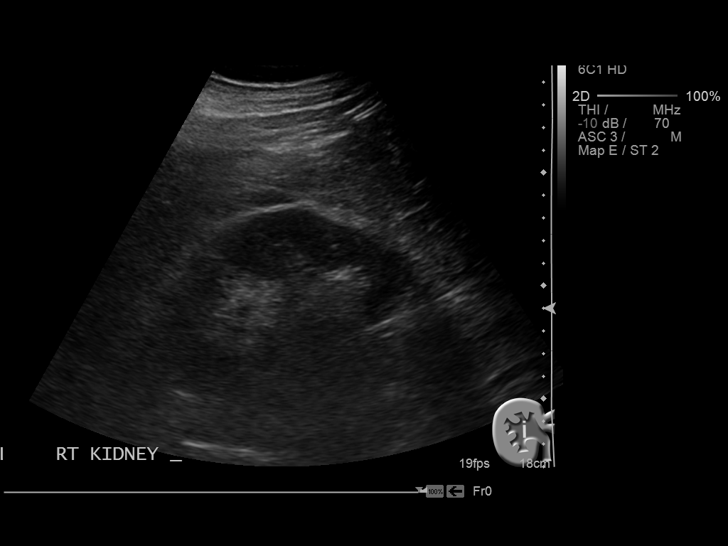
[im 37/56]
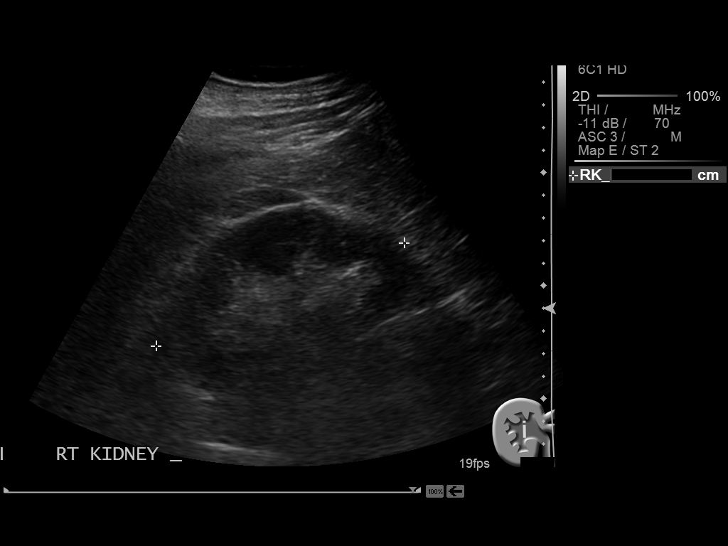
[im 42/56]
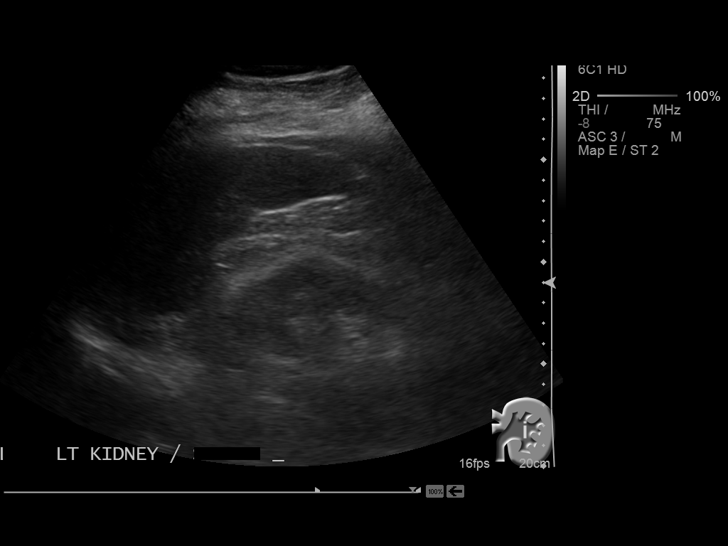
[im 46/56]
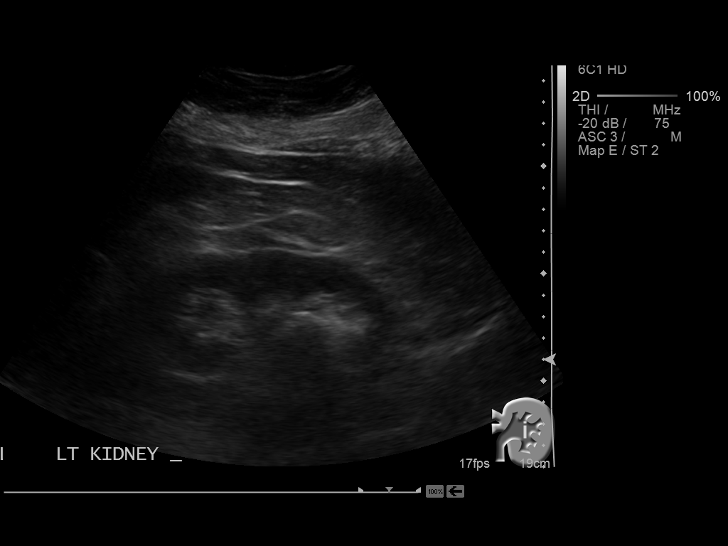
[im 51/56]
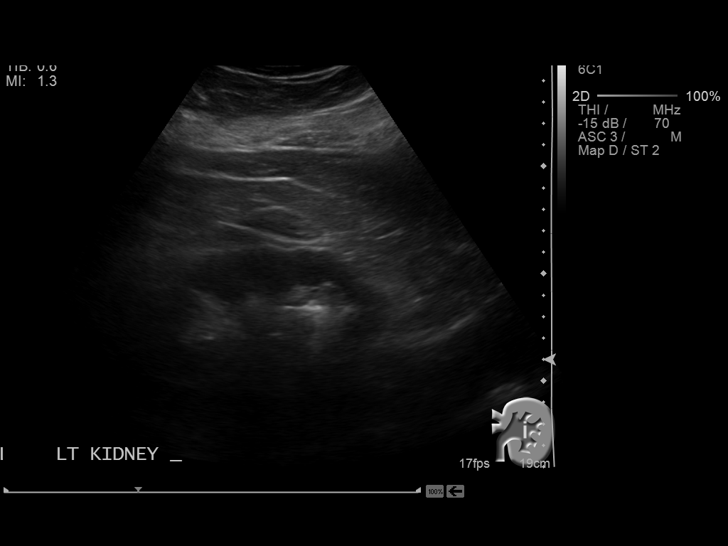
[im 56/56]
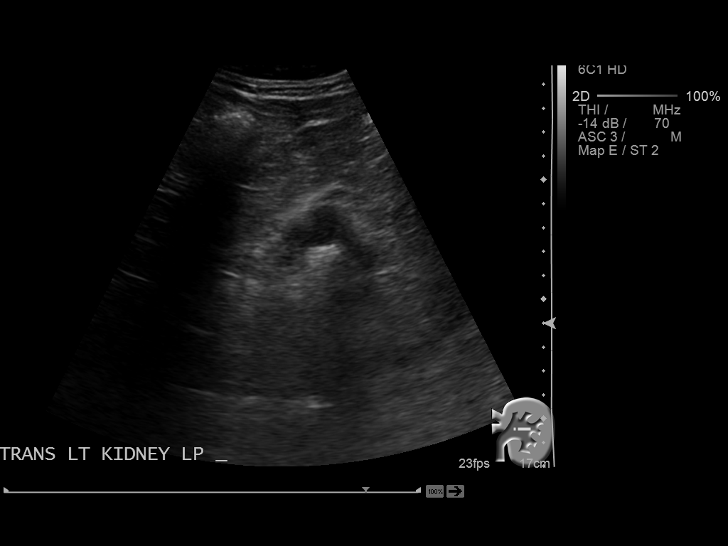

[14 of 25 positions shown; findings below may reference images not displayed]

FINDINGS: Abdominal Aorta

Study limited due to patient body habitus

Maximum AP

Diameter:  2.8 cm

Maximum TRV

Diameter: 2.6 cm

Right Common Iliac Artery

1.4 cm

Left Common Iliac Artery

1.3 cm

IVC

No abnormality visualized.

Right Kidney

Length: 11.9 cm  limited visualization

Left Kidney

Length: 13.4 cm limited visualization. Calcifications lower pole
measure about 1.8 cm.
IMPRESSION: Distal abdominal aortic aneurysm to approximately 28 x 26 mm.
Previous measurement was 29 x 33 mm.

## 2016-04-04 ENCOUNTER — Ambulatory Visit: Payer: PPO | Admitting: Family Medicine

## 2016-04-10 DIAGNOSIS — E1165 Type 2 diabetes mellitus with hyperglycemia: Secondary | ICD-10-CM | POA: Diagnosis not present

## 2016-04-10 DIAGNOSIS — Z125 Encounter for screening for malignant neoplasm of prostate: Secondary | ICD-10-CM | POA: Diagnosis not present

## 2016-04-17 DIAGNOSIS — I10 Essential (primary) hypertension: Secondary | ICD-10-CM | POA: Diagnosis not present

## 2016-04-17 DIAGNOSIS — R972 Elevated prostate specific antigen [PSA]: Secondary | ICD-10-CM | POA: Diagnosis not present

## 2016-04-17 DIAGNOSIS — E785 Hyperlipidemia, unspecified: Secondary | ICD-10-CM | POA: Diagnosis not present

## 2016-04-17 DIAGNOSIS — E119 Type 2 diabetes mellitus without complications: Secondary | ICD-10-CM | POA: Diagnosis not present

## 2016-04-29 ENCOUNTER — Other Ambulatory Visit: Payer: Self-pay | Admitting: Internal Medicine

## 2016-05-13 ENCOUNTER — Encounter: Payer: Self-pay | Admitting: Urology

## 2016-05-13 ENCOUNTER — Ambulatory Visit (INDEPENDENT_AMBULATORY_CARE_PROVIDER_SITE_OTHER): Payer: PPO | Admitting: Urology

## 2016-05-13 VITALS — BP 142/70 | HR 76 | Ht 70.0 in | Wt 289.0 lb

## 2016-05-13 DIAGNOSIS — R972 Elevated prostate specific antigen [PSA]: Secondary | ICD-10-CM | POA: Diagnosis not present

## 2016-05-13 DIAGNOSIS — K439 Ventral hernia without obstruction or gangrene: Secondary | ICD-10-CM | POA: Insufficient documentation

## 2016-05-13 DIAGNOSIS — N200 Calculus of kidney: Secondary | ICD-10-CM | POA: Insufficient documentation

## 2016-05-13 DIAGNOSIS — M1990 Unspecified osteoarthritis, unspecified site: Secondary | ICD-10-CM | POA: Insufficient documentation

## 2016-05-13 DIAGNOSIS — S22009A Unspecified fracture of unspecified thoracic vertebra, initial encounter for closed fracture: Secondary | ICD-10-CM | POA: Insufficient documentation

## 2016-05-13 DIAGNOSIS — N281 Cyst of kidney, acquired: Secondary | ICD-10-CM | POA: Insufficient documentation

## 2016-05-13 NOTE — Progress Notes (Signed)
05/13/2016 12:10 PM   [name] [name] [birth year] [id number]  Referring provider: Leone Haven, MD [address] [place], [place] [address]  Chief Complaint  Patient presents with  . Elevated PSA    New Patient    HPI: 75 year old male who is referred for further evaluation and management of an elevate the patient was found to have an elevated PSA as part of her routine annual screening evaluation. Patient denies any progressive voiding symptoms. Denies any dysuria or gross hematuria. He denies any urinary frequency or urgency. Patient has a strong stream and does not have any nocturia. He feels like he empties his bladder. 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 Over the past month he has had several episodes of renal colic. He has not followed by a urologist for this but prefers to have the stones on his own. It's been quite a while since he had a CT scan this.  The patient has no family history of prostate cancer. The patient had a coronary artery bypass graft surgery 3 years ago. He has subsequently had a postoperative MI.  He has very little physical fitness is unable to climb a flight of steps. He easily winded because of his heart failure.  PSA history:  6.63 on the/17/17 3.35 on 11/02/14    IPSS: 4, QOL 1 SHIM: 5  PMH: Past Medical History:  Diagnosis Date  . AAA (abdominal aortic aneurysm) ([place])   . ACE inhibitor associated hyperkalemia 09/20/2015  . Arthritis   . CAD (coronary artery disease)    a. 03/2014 NSTEMI--> 05/2014 CABG x 5 (LIMA->D1, VG->LAD, VG->OM1, VG->OM2, VG->RPDA);  c. 04/2015 Cath: LM nl, LAD 100ost, 40d, RI 80, LCX 80p/m, OM1 70, OM2 80, RCA 20p, 71m 90d, VG->dLAD min irregs, VG->OM1 100, VG->OM2 100, VG->RPDA nl, LIMA->D2 nl.  . Chronic systolic CHF (congestive heart failure) ([place]    a. EF 25-30% by 2D ECHO 04/20/14. Severely dec global hypokinesis. mildly elevated RVSP;  b. 12/2014 Echo: EF 40-45%, mod dil LA,  mildly dil RV/RA;  c. 04/2015 Echo: EF 30-35%, sev antsept HK, Gr1 DD, mildly dil RA/LA;  d. 07/2015 Echo: EF 35-40%, ant/antsept HK, Gr1 DD, mildly red RV fxn.  . Diabetes mellitus without complication ([place]   . Epigastric hernia   . Frequent PVCs    a. 24 hr Holter 06/2015: >28K PVCs accounting for 25% of all beats, rare PAC-->mexilitene started;  b. 07/2015 Holter: 1700 PVC's/24 hrs.  .Marland KitchenGERD (gastroesophageal reflux disease)   . Gout   . Hyperlipidemia   . Hypertension   . Ischemic cardiomyopathy    a. EF 25-30% 03/2014;  b. EF 40-45% 12/2014.  . Mitral regurgitation    a. 03/2014 TEE: mild to mod MR.  . Nephrolithiasis   . Neuromuscular disorder ([place]    DIABETIC NEUROPATHY  . Obesity   . Umbilical hernia     Surgical History: Past Surgical History:  Procedure Laterality Date  . CARDIAC CATHETERIZATION     [facility]  . CARDIAC CATHETERIZATION N/A 05/23/2015   Procedure: Left Heart Cath and Coronary Angiography;  Surgeon: MWellington Hampshire MD;  [facility];  Service: Cardiovascular;  Laterality: N/A;  . CARPAL TUNNEL RELEASE Right   . CORONARY ARTERY BYPASS GRAFT N/A 06/06/2014   Procedure: CORONARY ARTERY BYPASS GRAFTING (CABG) x 5 USING LEFT AND RIGHT SAPHENOUS LEG VEIN HARVESTED ENDOSCOPICALLY;  Surgeon: PIvin Poot MD;  [facility]  Service: Open Heart  Surgery;  Laterality: N/A;  . ESOPHAGOGASTRODUODENOSCOPY (EGD) WITH PROPOFOL N/A 02/11/2016   Procedure: ESOPHAGOGASTRODUODENOSCOPY (EGD) WITH PROPOFOL with dialation;  Surgeon: Lucilla Lame, MD;  Location: [facility];  Service: Endoscopy;  Laterality: N/A;  diabetic, oral meds  . INTRAOPERATIVE TRANSESOPHAGEAL ECHOCARDIOGRAM N/A 06/06/2014   Procedure: INTRAOPERATIVE TRANSESOPHAGEAL ECHOCARDIOGRAM;  Surgeon: Ivin Poot, MD;  Location: [facility];  Service: Open Heart Surgery;  Laterality: N/A;  . ROTATOR CUFF REPAIR Right 1996,1997,2000  . TEE WITHOUT CARDIOVERSION N/A 04/24/2014   Procedure:  TRANSESOPHAGEAL ECHOCARDIOGRAM (TEE);  Surgeon: Thayer Headings, MD;  Location: [facility];  Service: Cardiovascular;  Laterality: N/A;    Home Medications:    Medication List       Accurate as of 05/13/16 12:10 PM. Always use your most recent med list.          aspirin EC 81 MG tablet Take 1 tablet (81 mg total) by mouth daily.   atorvastatin 40 MG tablet Commonly known as:  LIPITOR TAKE ONE TABLET BY MOUTH ONCE DAILY   carvedilol 12.5 MG tablet Commonly known as:  COREG Take 1 tablet (12.5 mg total) by mouth 2 (two) times daily.   colchicine 0.6 MG tablet Take 0.6 mg by mouth daily as needed (for gout flareup).   furosemide 40 MG tablet Commonly known as:  LASIX TAKE ONE TABLET BY MOUTH ONCE DAILY   gabapentin 300 MG capsule Commonly known as:  NEURONTIN Take 300 mg by mouth 4 (four) times daily.   glucose blood test strip Commonly known as:  RELION GLUCOSE TEST STRIPS Check your blood sugar fasting daily and prior to dinner   losartan 25 MG tablet Commonly known as:  COZAAR TAKE ONE TABLET BY MOUTH ONCE DAILY   mexiletine 150 MG capsule Commonly known as:  MEXITIL TAKE ONE CAPSULE BY MOUTH TWICE DAILY   nitroGLYCERIN 0.4 MG SL tablet Commonly known as:  NITROSTAT Place 0.4 mg under the tongue every 5 (five) minutes as needed for chest pain.   RELION CONFIRM GLUCOSE MONITOR w/Device Kit Check your blood sugar fasting daily and prior to dinner   RELION LANCETS STANDARD 21G Misc Check your blood sugar fasting daily and prior to dinner   TRADJENTA 5 MG Tabs tablet Generic drug:  linagliptin 5 mg daily.       Allergies: No Known Allergies  Family History: Family History  Problem Relation Age of Onset  . Throat cancer Father   . Stroke Father   . Colon polyps Brother   . Lung cancer Brother   . Prostate cancer Neg Hx   . Bladder Cancer Neg Hx   . Kidney cancer Neg Hx     Social History:  reports that he has quit smoking. His smoking use  included Cigarettes. He has a 25.00 pack-year smoking history. He has quit using smokeless tobacco. His smokeless tobacco use included Chew. He reports that he does not drink alcohol or use drugs.  ROS: UROLOGY Frequent Urination?: Yes Hard to postpone urination?: Yes Burning/pain with urination?: No Get up at night to urinate?: No Leakage of urine?: No Urine stream starts and stops?: No Trouble starting stream?: No Do you have to strain to urinate?: No Blood in urine?: No Urinary tract infection?: No Sexually transmitted disease?: No Injury to kidneys or bladder?: No Painful intercourse?: No Weak stream?: No Erection problems?: Yes Penile pain?: No  Gastrointestinal Nausea?: No Vomiting?: No Indigestion/heartburn?: No Diarrhea?: No Constipation?: No  Constitutional Fever: No Night sweats?: No Weight  loss?: No Fatigue?: No  Skin Skin rash/lesions?: No Itching?: No  Eyes Blurred vision?: No Double vision?: No  Ears/Nose/Throat Sore throat?: No Sinus problems?: Yes  Hematologic/Lymphatic Swollen glands?: No Easy bruising?: No  Cardiovascular Leg swelling?: No Chest pain?: No  Respiratory Cough?: No Shortness of breath?: No  Endocrine Excessive thirst?: No  Musculoskeletal Back pain?: Yes Joint pain?: Yes  Neurological Headaches?: No Dizziness?: No  Psychologic Depression?: No Anxiety?: No  Physical Exam: BP (!) 142/70   Pulse 76   Ht 5' 10"  (1.778 m)   Wt 131.1 kg (289 lb)   BMI 41.47 kg/m   Constitutional:  Alert and oriented, No acute distress. HEENT: [place] AT, moist mucus membranes.  Trachea midline, no masses. Cardiovascular: No clubbing, cyanosis, or edema. Respiratory: Normal respiratory effort, no increased work of breathing. GI: Abdomen is soft, nontender, nondistended, no abdominal masses GU: No CVA tenderness. DRE: Normal size prostate, no nodules, symmetric  Skin: No rashes, bruises or suspicious lesions. Lymph: No cervical  or inguinal adenopathy. Neurologic: Grossly intact, no focal deficits, moving all 4 extremities. Psychiatric: Normal mood and affect.  Laboratory Data: Lab Results  Component Value Date   WBC 10.5 05/15/2015   HGB 9.5 (L) 06/13/2014   HCT 40.5 05/15/2015   MCV 91 05/15/2015   PLT 220 05/15/2015    Lab Results  Component Value Date   CREATININE 1.52 (H) 03/03/2016    No results found for: PSA  No results found for: TESTOSTERONE  Lab Results  Component Value Date   HGBA1C 7.1 (H) 03/03/2016    Urinalysis    Component Value Date/Time   COLORURINE YELLOW 06/02/2014 1443   APPEARANCEUR CLEAR 06/02/2014 1443   LABSPEC 1.020 06/02/2014 1443   PHURINE 5.0 06/02/2014 1443   GLUCOSEU NEGATIVE 06/02/2014 1443   [place] 06/02/2014 1443   BILIRUBINUR NEGATIVE 06/02/2014 1443   KETONESUR NEGATIVE 06/02/2014 1443   PROTEINUR NEGATIVE 06/02/2014 1443   UROBILINOGEN 0.2 06/02/2014 1443   NITRITE NEGATIVE 06/02/2014 1443   LEUKOCYTESUR NEGATIVE 06/02/2014 1443    Pertinent Imaging: None  Assessment & Plan:  The patient has an isolated elevation in his PSA. He has no other symptoms. He has been passing uric acid stones in the past month. Question as to whether this is relevant and conjunctivae to his rising PSA and clear. However, the patient does have severe coronary artery disease and a poor functional capacity.   1. Elevated PSA I discussed the implications of an elevated PSA this patient. We discussed his overall well-being and life expectancy. At this point, our plan is to repeat the PSA today and then again in 6 months. We would likely not treat any diagnosed prostate cancer grossly but would consider hormonal therapy if necessary. As such, we will trend his PSA and ultimately if he needs a prostate biopsy we will proceed  So that he may be started on hormonal therapy if needed.  However, we would not treat his prostate cancer with curative intent given the morbidity  associated with it questionable benefit. - PSA - PSA   Return in 6 months (on 11/10/2016).  Ardis Hughs, [facility] [address], [place], [place] [address] [phone]

## 2016-05-14 LAB — PSA: PROSTATE SPECIFIC AG, SERUM: 7.5 ng/mL — AB (ref 0.0–4.0)

## 2016-05-26 ENCOUNTER — Telehealth: Payer: Self-pay

## 2016-05-26 NOTE — Telephone Encounter (Signed)
Spoke with pt in reference to PSA results. Pt voiced understanding.  

## 2016-05-26 NOTE — Telephone Encounter (Signed)
Left message with pt "friend" to have pt return the call.  

## 2016-05-26 NOTE — Telephone Encounter (Signed)
-----   Message from Crist Fat, MD sent at 05/24/2016  7:19 AM EDT ----- Regarding: PSA Please let patient know that his PSA is slightly higher than when previously checked and that we will continue to follow it. ----- Message ----- From: SYSTEM Sent: 05/18/2016  12:04 AM To: Crist Fat, MD

## 2016-05-30 ENCOUNTER — Ambulatory Visit (INDEPENDENT_AMBULATORY_CARE_PROVIDER_SITE_OTHER): Payer: PPO | Admitting: Cardiovascular Disease

## 2016-05-30 ENCOUNTER — Encounter: Payer: Self-pay | Admitting: Cardiovascular Disease

## 2016-05-30 VITALS — BP 128/62 | HR 65 | Ht 72.0 in | Wt 295.5 lb

## 2016-05-30 DIAGNOSIS — I493 Ventricular premature depolarization: Secondary | ICD-10-CM | POA: Diagnosis not present

## 2016-05-30 DIAGNOSIS — I10 Essential (primary) hypertension: Secondary | ICD-10-CM | POA: Diagnosis not present

## 2016-05-30 DIAGNOSIS — Z23 Encounter for immunization: Secondary | ICD-10-CM

## 2016-05-30 DIAGNOSIS — I5022 Chronic systolic (congestive) heart failure: Secondary | ICD-10-CM

## 2016-05-30 DIAGNOSIS — I2510 Atherosclerotic heart disease of native coronary artery without angina pectoris: Secondary | ICD-10-CM

## 2016-05-30 NOTE — Progress Notes (Signed)
Cardiology Office Note   Date:  05/30/2016   ID:  [name] [name], DOB 1941/01/24, MRN [id number]  PCP:  [place] Pink, MD  Cardiologist:   Kathlyn Sacramento, MD   Chief Complaint  Patient presents with  . Other    3 month f/u c/o irregular heart beat with glimepiride. Meds reviewed verbally with pt.      History of Present Illness: [name] [name] is a 75 y.o. male who presents for a follow-up visit regarding coronary artery disease, chronic systolic heart failure and frequent PVCs. He underwent CABG x 5 in 05/2014 after presenting with non-ST elevation myocardial infarction and heart failure. EF pre-CABG was 25-30%. F/U echo in 12/2014 showed EF of 40-45%.  Cardiac catheterization done in September 2016 showed 3 of 5 patent grafts with new occlusions of the vein grafts to the OM1 and OM 2. The native obtuse marginals were small and not felt to be amenable to PCI. Medical therapy was recommended.  He had worsening of LV systolic function due to very frequent PVCs. He was placed on mexiletine therapy. Follow-up Holter monitoring showed significant improvement in PVC burden down to 1724 hours. Follow-up echocardiography showed modestly improved LV function, now with an EF of 35-40%.  He has been doing well overall with no chest pain or worsening dyspnea. No palpitations. His biggest issue seems to be dealing with his diabetes and he reports multiple side effects to his diabetic medications.    Past Medical History:  Diagnosis Date  . AAA (abdominal aortic aneurysm) ([place])   . ACE inhibitor associated hyperkalemia 09/20/2015  . Arthritis   . CAD (coronary artery disease)    a. 03/2014 NSTEMI--> 05/2014 CABG x 5 (LIMA->D1, VG->LAD, VG->OM1, VG->OM2, VG->RPDA);  c. 04/2015 Cath: LM nl, LAD 100ost, 40d, RI 80, LCX 80p/m, OM1 70, OM2 80, RCA 20p, 22m 90d, VG->dLAD min irregs, VG->OM1 100, VG->OM2 100, VG->RPDA nl, LIMA->D2 nl.  . Chronic systolic CHF (congestive heart failure) ([facility]     a. EF 25-30% by 2D ECHO 04/20/14. Severely dec global hypokinesis. mildly elevated RVSP;  b. 12/2014 Echo: EF 40-45%, mod dil LA, mildly dil RV/RA;  c. 04/2015 Echo: EF 30-35%, sev antsept HK, Gr1 DD, mildly dil RA/LA;  d. 07/2015 Echo: EF 35-40%, ant/antsept HK, Gr1 DD, mildly red RV fxn.  . Diabetes mellitus without complication ([place]   . Epigastric hernia   . Frequent PVCs    a. 24 hr Holter 06/2015: >28K PVCs accounting for 25% of all beats, rare PAC-->mexilitene started;  b. 07/2015 Holter: 1700 PVC's/24 hrs.  .Marland KitchenGERD (gastroesophageal reflux disease)   . Gout   . Hyperlipidemia   . Hypertension   . Ischemic cardiomyopathy    a. EF 25-30% 03/2014;  b. EF 40-45% 12/2014.  . Mitral regurgitation    a. 03/2014 TEE: mild to mod MR.  . Nephrolithiasis   . Neuromuscular disorder ([facility]    DIABETIC NEUROPATHY  . Obesity   . Umbilical hernia     Past Surgical History:  Procedure Laterality Date  . CARDIAC CATHETERIZATION     [facility]  . CARDIAC CATHETERIZATION N/A 05/23/2015   Procedure: Left Heart Cath and Coronary Angiography;  Surgeon: MWellington Hampshire MD;  [facility];  Service: Cardiovascular;  Laterality: N/A;  . CARPAL TUNNEL RELEASE Right   . CORONARY ARTERY BYPASS GRAFT N/A 06/06/2014   Procedure: CORONARY ARTERY BYPASS GRAFTING (CABG) x 5 USING LEFT AND RIGHT SAPHENOUS LEG VEIN HARVESTED ENDOSCOPICALLY;  Surgeon: Ivin Poot, MD;  Location: [place];  Service: Open Heart Surgery;  Laterality: N/A;  . ESOPHAGOGASTRODUODENOSCOPY (EGD) WITH PROPOFOL N/A 02/11/2016   Procedure: ESOPHAGOGASTRODUODENOSCOPY (EGD) WITH PROPOFOL with dialation;  Surgeon: Lucilla Lame, MD;  Location: [place];  Service: Endoscopy;  Laterality: N/A;  diabetic, oral meds  . INTRAOPERATIVE TRANSESOPHAGEAL ECHOCARDIOGRAM N/A 06/06/2014   Procedure: INTRAOPERATIVE TRANSESOPHAGEAL ECHOCARDIOGRAM;  Surgeon: Ivin Poot, MD;  Location: [place];  Service: Open Heart Surgery;  Laterality: N/A;   . ROTATOR CUFF REPAIR Right 1996,1997,2000  . TEE WITHOUT CARDIOVERSION N/A 04/24/2014   Procedure: TRANSESOPHAGEAL ECHOCARDIOGRAM (TEE);  Surgeon: Thayer Headings, MD;  Location: [facility] ENDOSCOPY;  Service: Cardiovascular;  Laterality: N/A;     Current Outpatient Prescriptions  Medication Sig Dispense Refill  . aspirin EC 81 MG tablet Take 1 tablet (81 mg total) by mouth daily.    [name] atorvastatin (LIPITOR) 40 MG tablet TAKE ONE TABLET BY MOUTH ONCE DAILY 90 tablet 3  . Blood Glucose Monitoring Suppl (RELION CONFIRM GLUCOSE MONITOR) w/Device KIT Check your blood sugar fasting daily and prior to dinner 1 kit 0  . carvedilol (COREG) 12.5 MG tablet Take 1 tablet (12.5 mg total) by mouth 2 (two) times daily.    . colchicine 0.6 MG tablet Take 0.6 mg by mouth daily as needed (for gout flareup).     . diphenhydramine-acetaminophen (TYLENOL PM) 25-500 MG TABS tablet Take 1 tablet by mouth at bedtime as needed.    . furosemide (LASIX) 40 MG tablet TAKE ONE TABLET BY MOUTH ONCE DAILY 30 tablet 6  . gabapentin (NEURONTIN) 300 MG capsule Take 300 mg by mouth 4 (four) times daily.     [name] glucose blood (RELION GLUCOSE TEST STRIPS) test strip Check your blood sugar fasting daily and prior to dinner 100 each 12  . losartan (COZAAR) 25 MG tablet TAKE ONE TABLET BY MOUTH ONCE DAILY 30 tablet 3  . mexiletine (MEXITIL) 150 MG capsule TAKE ONE CAPSULE BY MOUTH TWICE DAILY 60 capsule 6  . nitroGLYCERIN (NITROSTAT) 0.4 MG SL tablet Place 0.4 mg under the tongue every 5 (five) minutes as needed for chest pain.    [name] RELION LANCETS STANDARD 21G MISC Check your blood sugar fasting daily and prior to dinner 200 each 1   No current facility-administered medications for this visit.     Allergies:   Glimepiride    Social History:  The patient  reports that he has quit smoking. His smoking use included Cigarettes. He has a 25.00 pack-year smoking history. He has quit using smokeless tobacco. His smokeless tobacco use  included Chew. He reports that he does not drink alcohol or use drugs.   Family History:  The patient's family history includes Colon polyps in his brother; Lung cancer in his brother; Stroke in his father; Throat cancer in his father.    ROS:  Please see the history of present illness.   Otherwise, review of systems are positive for none.   All other systems are reviewed and negative.    PHYSICAL EXAM: VS:  BP 128/62 (BP Location: Left Arm, Patient Position: Sitting, Cuff Size: Large)   Pulse 65   Ht 6' (1.829 m)   Wt 295 lb 8 oz (134 kg)   BMI 40.08 kg/m  , BMI Body mass index is 40.08 kg/m. GEN: Well nourished, well developed, in no acute distress  HEENT: normal  Neck: no JVD, carotid bruits, or masses Cardiac: RRR; no murmurs, rubs, or gallops.  Trace leg edema  Respiratory:  clear to auscultation bilaterally, normal work of breathing GI: soft, nontender, nondistended, + BS MS: no deformity or atrophy  Skin: warm and dry, no rash Neuro:  Strength and sensation are intact Psych: euthymic mood, full affect   EKG:  EKG is ordered today. EKG showed sinus rhythm with first degree AV block.   Recent Labs: 07/17/2015: Magnesium 2.2 03/03/2016: ALT 15; BUN 28; Creatinine, Ser 1.52; Potassium 4.2; Sodium 138    Lipid Panel    Component Value Date/Time   CHOL 95 (L) 12/25/2014 1215   TRIG 129 12/25/2014 1215   HDL 25 (L) 12/25/2014 1215   CHOLHDL 3.8 12/25/2014 1215   CHOLHDL 3.8 04/24/2014 0407   VLDL 23 04/24/2014 0407   LDLCALC 44 12/25/2014 1215      Wt Readings from Last 3 Encounters:  05/30/16 295 lb 8 oz (134 kg)  05/13/16 289 lb (131.1 kg)  03/03/16 292 lb (132.5 kg)       ASSESSMENT AND PLAN:  1.  Chronic systolic heart failure:  He is doing well overall with no evidence of volume overload. He did have previous hyperkalemia with ACE inhibitor but he has been tolerating small dose losartan. Continue same dose of carvedilol.  2. Symptomatic PVCs:  Significant improvement with mexiletine which is currently tolerated at the current dose.   3. Coronary artery disease: Status post CABG: He has no anginal symptoms at the present time.  4. Essential hypertension: Blood pressure is controlled on current medications. I reviewed his labs from August which showed a creatinine 1.3, BUN of 24 and potassium of 4.8.  5. Hyperlipidemia: Continue treatment with atorvastatin. I reviewed his labs from March 2017 which showed a total cholesterol of 102, triglyceride of 161, HDL of 24 and LDL of 46.   Disposition:   FU with me in 6 months  Signed,  Kathlyn Sacramento, MD  05/30/2016 10:42 AM    [place]

## 2016-05-30 NOTE — Patient Instructions (Signed)
Medication Instructions: Continue same medications.   Labwork: None.   Procedures/Testing: None.   Follow-Up: 6 months with Dr. Verland Sprinkle.   Any Additional Special Instructions Will Be Listed Below (If Applicable).     If you need a refill on your cardiac medications before your next appointment, please call your pharmacy.   

## 2016-06-04 DIAGNOSIS — M10032 Idiopathic gout, left wrist: Secondary | ICD-10-CM | POA: Diagnosis not present

## 2016-06-04 NOTE — Addendum Note (Signed)
Addended by: Kendrick Fries on: 06/04/2016 03:10 PM   Modules accepted: Orders

## 2016-07-14 ENCOUNTER — Other Ambulatory Visit: Payer: Self-pay | Admitting: Cardiovascular Disease

## 2016-08-13 DIAGNOSIS — E119 Type 2 diabetes mellitus without complications: Secondary | ICD-10-CM | POA: Diagnosis not present

## 2016-08-28 DIAGNOSIS — R972 Elevated prostate specific antigen [PSA]: Secondary | ICD-10-CM | POA: Diagnosis not present

## 2016-08-28 DIAGNOSIS — R251 Tremor, unspecified: Secondary | ICD-10-CM | POA: Diagnosis not present

## 2016-08-28 DIAGNOSIS — E669 Obesity, unspecified: Secondary | ICD-10-CM | POA: Diagnosis not present

## 2016-08-28 DIAGNOSIS — E119 Type 2 diabetes mellitus without complications: Secondary | ICD-10-CM | POA: Diagnosis not present

## 2016-08-28 DIAGNOSIS — I10 Essential (primary) hypertension: Secondary | ICD-10-CM | POA: Diagnosis not present

## 2016-08-29 ENCOUNTER — Other Ambulatory Visit: Payer: Self-pay | Admitting: Nurse Practitioner

## 2016-09-02 ENCOUNTER — Other Ambulatory Visit: Payer: Self-pay | Admitting: Cardiovascular Disease

## 2016-10-09 DIAGNOSIS — E119 Type 2 diabetes mellitus without complications: Secondary | ICD-10-CM | POA: Diagnosis not present

## 2016-10-09 DIAGNOSIS — H5203 Hypermetropia, bilateral: Secondary | ICD-10-CM | POA: Diagnosis not present

## 2016-10-09 DIAGNOSIS — H2513 Age-related nuclear cataract, bilateral: Secondary | ICD-10-CM | POA: Diagnosis not present

## 2016-10-09 DIAGNOSIS — H52223 Regular astigmatism, bilateral: Secondary | ICD-10-CM | POA: Diagnosis not present

## 2016-10-09 DIAGNOSIS — H524 Presbyopia: Secondary | ICD-10-CM | POA: Diagnosis not present

## 2016-10-09 LAB — HM DIABETES EYE EXAM

## 2016-10-23 ENCOUNTER — Other Ambulatory Visit: Payer: Self-pay | Admitting: Cardiovascular Disease

## 2016-10-29 ENCOUNTER — Other Ambulatory Visit: Payer: Self-pay | Admitting: *Deleted

## 2016-10-29 MED ORDER — LOSARTAN POTASSIUM 25 MG PO TABS: 25.0000 mg | ORAL_TABLET | Freq: Every day | ORAL | 3 refills | Status: DC

## 2016-11-10 ENCOUNTER — Ambulatory Visit: Payer: PPO

## 2016-11-18 ENCOUNTER — Ambulatory Visit: Payer: PPO

## 2016-12-01 ENCOUNTER — Ambulatory Visit: Payer: PPO | Admitting: Cardiovascular Disease

## 2016-12-01 ENCOUNTER — Other Ambulatory Visit: Payer: Self-pay | Admitting: Internal Medicine

## 2016-12-23 DIAGNOSIS — E119 Type 2 diabetes mellitus without complications: Secondary | ICD-10-CM | POA: Diagnosis not present

## 2016-12-23 DIAGNOSIS — Z0000 Encounter for general adult medical examination without abnormal findings: Secondary | ICD-10-CM | POA: Diagnosis not present

## 2016-12-30 DIAGNOSIS — M109 Gout, unspecified: Secondary | ICD-10-CM | POA: Diagnosis not present

## 2016-12-30 DIAGNOSIS — E1142 Type 2 diabetes mellitus with diabetic polyneuropathy: Secondary | ICD-10-CM | POA: Diagnosis not present

## 2016-12-30 DIAGNOSIS — Z0000 Encounter for general adult medical examination without abnormal findings: Secondary | ICD-10-CM | POA: Diagnosis not present

## 2016-12-30 DIAGNOSIS — E785 Hyperlipidemia, unspecified: Secondary | ICD-10-CM | POA: Diagnosis not present

## 2016-12-31 ENCOUNTER — Other Ambulatory Visit: Payer: Self-pay | Admitting: Internal Medicine

## 2017-01-26 ENCOUNTER — Telehealth: Payer: Self-pay | Admitting: Cardiovascular Disease

## 2017-01-26 NOTE — Telephone Encounter (Signed)
Patient came into office to rs April appt with Kirke Corin. Scheduled next available with arida 03/31/17 .  ptient offered a sooner appt with Eula Listen  01/29/17 but refused.  Added to waitlist

## 2017-01-29 ENCOUNTER — Other Ambulatory Visit: Payer: Self-pay | Admitting: Internal Medicine

## 2017-01-29 ENCOUNTER — Other Ambulatory Visit: Payer: Self-pay

## 2017-01-29 MED ORDER — MEXILETINE HCL 150 MG PO CAPS: 150.0000 mg | ORAL_CAPSULE | Freq: Two times a day (BID) | ORAL | 0 refills | Status: DC

## 2017-01-29 NOTE — Telephone Encounter (Signed)
[place]- was Dr. Kirke Corin ok to refill?

## 2017-01-29 NOTE — Telephone Encounter (Signed)
Pt requesting 90 day refill. Pt is due for appointment w/ Dr. Graciela Husbands . Please advise if ok to refill.

## 2017-02-02 NOTE — Telephone Encounter (Signed)
Yes ma'am. We sent in just enough to get him through until his appointment with [name] on July 2nd.   mexiletine (MEXITIL) 150 MG capsule 60 capsule 0 01/29/2017    Sig - Route: Take 1 capsule (150 mg total) by mouth 2 (two) times daily. - Oral   E-Prescribing Status: Receipt confirmed by pharmacy (01/29/2017 4:17 PM EDT)

## 2017-02-23 ENCOUNTER — Encounter: Payer: Self-pay | Admitting: Physician Assistant

## 2017-02-23 ENCOUNTER — Ambulatory Visit (INDEPENDENT_AMBULATORY_CARE_PROVIDER_SITE_OTHER): Payer: PPO | Admitting: Physician Assistant

## 2017-02-23 VITALS — BP 130/60 | HR 73 | Ht 72.0 in | Wt 280.0 lb

## 2017-02-23 DIAGNOSIS — I340 Nonrheumatic mitral (valve) insufficiency: Secondary | ICD-10-CM

## 2017-02-23 DIAGNOSIS — R946 Abnormal results of thyroid function studies: Secondary | ICD-10-CM | POA: Diagnosis not present

## 2017-02-23 DIAGNOSIS — E785 Hyperlipidemia, unspecified: Secondary | ICD-10-CM | POA: Diagnosis not present

## 2017-02-23 DIAGNOSIS — I2510 Atherosclerotic heart disease of native coronary artery without angina pectoris: Secondary | ICD-10-CM | POA: Diagnosis not present

## 2017-02-23 DIAGNOSIS — I5022 Chronic systolic (congestive) heart failure: Secondary | ICD-10-CM

## 2017-02-23 DIAGNOSIS — I493 Ventricular premature depolarization: Secondary | ICD-10-CM

## 2017-02-23 DIAGNOSIS — I10 Essential (primary) hypertension: Secondary | ICD-10-CM | POA: Diagnosis not present

## 2017-02-23 NOTE — Progress Notes (Signed)
Cardiology Office Note Date:  02/23/2017  Patient ID:  [name] [name], [name] [name] 09/26/1940, MRN [id number] PCP:  [name], MD  Cardiologist:  Dr. Kirke Corin, MD    Chief Complaint: Follow up CAD/frequent PVCs  History of Present Illness: [name] [name] is a 76 y.o. male with history of CAD s/p 5 vessel CABG in 05/2014 in the setting of NSTEMI with acute systolic CHF, ICM, AAA, mild to moderate mitral regurgitation, ACEi-induced hyperkalemia, frequent PVCs, DM2 with diabetic neuropathy, HTN, HLD, and obesity who presents for routine follow up of his CAD and frequent PVCs.  Most recent cardiac cath in 04/2015 showed 3 of 5 patent grafts with new occlusions of the vein grafts to the OM1 and OM2. The native marginals were small and not felt to be amenable to PCI. Medical therapy was advised. He had worsening LV systolic function to 30-35% in 04/2015 due to very frequent PVCs with Holter monitoring in 06/2015 demonstrating 28,000 PVCs and was placed on mexiletine therapy at the recommendation of EP. Follow up Holter monitor in 07/2015 showed significant improvement in PVC burden down to 1724. Follow-up echocardiography in 07/2015 showed modestly improved LV function, with an EF of 35-40%. Unfortunately, his mexiletine was stopped due to poor tolerance and his Coreg dose was increased. Though he did self restart the mexiletine on his own shortly after it was discontinued and he has been tolerating it without issues. He underwent repeat Holter monitoring in 10/2015 with a decreased dose of mexiletine that showed 7001 PVCs. He was last seen by Dr. Kirke Corin on 05/30/16 and doing well at that time.   Potassium 02/2016 - 4.2  Magnesium 06/2015 - 2.2   He comes in today doing well. No complaints of chest pain, shortness of breath, palpitations, lower extremity swelling, nausea, vomiting, dizziness, presyncope, or syncope. He is tolerating all medications without issues. Weight is down approximately 15 pounds today  when compared to October 2017 weight. He continues to walk the aisles of [employer] on an almost daily basis.   Past Medical History:  Diagnosis Date  . AAA (abdominal aortic aneurysm) ([facility])   . ACE inhibitor associated hyperkalemia 09/20/2015  . Arthritis   . CAD (coronary artery disease)    a. 03/2014 NSTEMI--> 05/2014 CABG x 5 (LIMA->D1, VG->LAD, VG->OM1, VG->OM2, VG->RPDA);  c. 04/2015 Cath: LM nl, LAD 100ost, 40d, RI 80, LCX 80p/m, OM1 70, OM2 80, RCA 20p, 72m, 90d, VG->dLAD min irregs, VG->OM1 100, VG->OM2 100, VG->RPDA nl, LIMA->D2 nl.  . Chronic systolic CHF (congestive heart failure) ([facility])    a. EF 25-30% by 2D ECHO 04/20/14. Severely dec global hypokinesis. mildly elevated RVSP;  b. 12/2014 Echo: EF 40-45%, mod dil LA, mildly dil RV/RA;  c. 04/2015 Echo: EF 30-35%, sev antsept HK, Gr1 DD, mildly dil RA/LA;  d. 07/2015 Echo: EF 35-40%, ant/antsept HK, Gr1 DD, mildly red RV fxn.  . Diabetes mellitus without complication ([facility])   . Epigastric hernia   . Frequent PVCs    a. 24 hr Holter 06/2015: >28K PVCs accounting for 25% of all beats, rare PAC-->mexilitene started;  b. 07/2015 Holter: 1700 PVC's/24 hrs.  [name] GERD (gastroesophageal reflux disease)   . Gout   . Hyperlipidemia   . Hypertension   . Ischemic cardiomyopathy    a. EF 25-30% 03/2014;  b. EF 40-45% 12/2014.  . Mitral regurgitation    a. 03/2014 TEE: mild to mod MR.  . Nephrolithiasis   . Neuromuscular disorder ([facility])    DIABETIC NEUROPATHY  .  Obesity   . Umbilical hernia     Past Surgical History:  Procedure Laterality Date  . CARDIAC CATHETERIZATION     [facility]  . CARDIAC CATHETERIZATION N/A 05/23/2015   Procedure: Left Heart Cath and Coronary Angiography;  Surgeon: Iran Ouch, MD;  [facility];  Service: Cardiovascular;  Laterality: N/A;  . CARPAL TUNNEL RELEASE Right   . CORONARY ARTERY BYPASS GRAFT N/A 06/06/2014   Procedure: CORONARY ARTERY BYPASS GRAFTING (CABG) x 5 USING LEFT AND RIGHT SAPHENOUS LEG  VEIN HARVESTED ENDOSCOPICALLY;  Surgeon: Kerin Perna, MD;  Location: [facility];  Service: Open Heart Surgery;  Laterality: N/A;  . ESOPHAGOGASTRODUODENOSCOPY (EGD) WITH PROPOFOL N/A 02/11/2016   Procedure: ESOPHAGOGASTRODUODENOSCOPY (EGD) WITH PROPOFOL with dialation;  Surgeon: [name], MD;  Location[facility];  Service: Endoscopy;  Laterality: N/A;  diabetic, oral meds  . INTRAOPERATIVE TRANSESOPHAGEAL ECHOCARDIOGRAM N/A 06/06/2014   Procedure: INTRAOPERATIVE TRANSESOPHAGEAL ECHOCARDIOGRAM;  Surgeon: Kerin Perna, MD;  Location: [facility] OR;  Service: Open Heart Surgery;  Laterality: N/A;  . ROTATOR CUFF REPAIR Right 1996,1997,2000  . TEE WITHOUT CARDIOVERSION N/A 04/24/2014   Procedure: TRANSESOPHAGEAL ECHOCARDIOGRAM (TEE);  Surgeon: [name], MD;  [facility];  Service: Cardiovascular;  Laterality: N/A;    Current Meds  Medication Sig  . aspirin EC 81 MG tablet Take 1 tablet (81 mg total) by mouth daily.  [name] atorvastatin (LIPITOR) 40 MG tablet TAKE ONE TABLET BY MOUTH ONCE DAILY  . carvedilol (COREG) 12.5 MG tablet Take 1 tablet (12.5 mg total) by mouth 2 (two) times daily.  . colchicine 0.6 MG tablet Take 0.6 mg by mouth daily as needed (for gout flareup).   . diphenhydramine-acetaminophen (TYLENOL PM) 25-500 MG TABS tablet Take 1 tablet by mouth at bedtime as needed.  . furosemide (LASIX) 40 MG tablet TAKE ONE TABLET BY MOUTH ONCE DAILY  . gabapentin (NEURONTIN) 300 MG capsule Take 300 mg by mouth 4 (four) times daily.   [name] losartan (COZAAR) 25 MG tablet Take 1 tablet (25 mg total) by mouth daily.  [name] mexiletine (MEXITIL) 150 MG capsule Take 1 capsule (150 mg total) by mouth 2 (two) times daily.  . nitroGLYCERIN (NITROSTAT) 0.4 MG SL tablet Place 0.4 mg under the tongue every 5 (five) minutes as needed for chest pain.    Allergies:   Glimepiride   Social History:  The patient  reports that he has quit smoking. His smoking use included Cigarettes. He has a 25.00  pack-year smoking history. He has quit using smokeless tobacco. His smokeless tobacco use included Chew. He reports that he does not drink alcohol or use drugs.   Family History:  The patient's family history includes Colon polyps in his brother; Lung cancer in his brother; Stroke in his father; Throat cancer in his father.  ROS:   Review of Systems  Constitutional: Negative for chills, diaphoresis, fever, malaise/fatigue and weight loss.  HENT: Negative for congestion.   Eyes: Negative for discharge and redness.  Respiratory: Negative for cough, hemoptysis, sputum production, shortness of breath and wheezing.   Cardiovascular: Negative for chest pain, palpitations, orthopnea, claudication, leg swelling and PND.  Gastrointestinal: Negative for abdominal pain, blood in stool, heartburn, melena, nausea and vomiting.  Genitourinary: Negative for hematuria.  Musculoskeletal: Negative for falls and myalgias.  Skin: Negative for rash.  Neurological: Negative for dizziness, tingling, tremors, sensory change, speech change, focal weakness, loss of consciousness and weakness.  Endo/Heme/Allergies: Does not bruise/bleed easily.  Psychiatric/Behavioral: Negative  for substance abuse. The patient is not nervous/anxious.   All other systems reviewed and are negative.    PHYSICAL EXAM:  VS:  BP 130/60 (BP Location: Left Arm, Patient Position: Sitting, Cuff Size: Large)   Pulse 73   Ht 6' (1.829 m)   Wt 280 lb (127 kg)   BMI 37.97 kg/m  BMI: Body mass index is 37.97 kg/m.  Physical Exam  Constitutional: He is oriented to person, place, and time. He appears well-developed and well-nourished.  HENT:  Head: Normocephalic and atraumatic.  Eyes: Right eye exhibits no discharge. Left eye exhibits no discharge.  Neck: Normal range of motion. No JVD present.  Cardiovascular: Normal rate, regular rhythm, S1 normal, S2 normal and normal heart sounds.  Exam reveals no distant heart sounds, no friction rub,  no midsystolic click and no opening snap.   No murmur heard. Pulmonary/Chest: Effort normal and breath sounds normal. No respiratory distress. He has no decreased breath sounds. He has no wheezes. He has no rales. He exhibits no tenderness.  Abdominal: Soft. He exhibits no distension. There is no tenderness.  Musculoskeletal: He exhibits no edema.  Neurological: He is alert and oriented to person, place, and time.  Skin: Skin is warm and dry. No cyanosis. Nails show no clubbing.  Psychiatric: He has a normal mood and affect. His speech is normal and behavior is normal. Judgment and thought content normal.     EKG:  Was ordered and interpreted by me today. Shows NSR, 71 bpm, prior inferior infarct, poor R wave progression, no acute st/t changes  Recent Labs: 03/03/2016: ALT 15; BUN 28; Creatinine, Ser 1.52; Potassium 4.2; Sodium 138  No results found for requested labs within last 8760 hours.   CrCl cannot be calculated (Patient's most recent lab result is older than the maximum 21 days allowed.).   Wt Readings from Last 3 Encounters:  02/23/17 280 lb (127 kg)  05/30/16 295 lb 8 oz (134 kg)  05/13/16 289 lb (131.1 kg)     Other studies reviewed: Additional studies/records reviewed today include: summarized above  ASSESSMENT AND PLAN:  1. CAD as above: No symptoms concerning for angina. Continue carvedilol, Lipitor, ASA. No plans for further ischemic evaluation at this time.  2. Frequent PVCs: Asymptomatic. Tolerating carvedilol 12.5 mg twice a day along with mexiletine 150 mg twice a day without issues. We will schedule a 24 hour Holter monitor to evaluate for increased ventricular ectopy. Should he have further increase in ventricular ectopy would recommend follow-up echocardiogram to evaluate for further reduction in LV systolic function. We will check CMP, TSH, magnesium, and CBC.  3. Chronic systolic CHF. Patient does not appear volume overloaded at this time. Weight is down  approximately 15 pounds today from his October 2017 visit. He is watching what she eats. Continues to take carvedilol, losartan, and Lasix without issues. No changes to his heart failure regimen at this time. Not on ACE inhibitor or spironolactone secondary to ACE inhibitor induced hyperkalemia.  4. Mitral regurgitation: Plan for follow-up echocardiogram in the fall of 2018.  5. Hypertension: Well-controlled. Continue current medications without changes.  6. Hyperlipidemia: Continue Lipitor. Will need fasting labs prior to next visit.  7. Patient behavior: Patient made inappropriate comment to PA student prior to leaving the exam room, asking for her to sit on his lap. Student declined to report this.   Disposition: F/u with Dr. Kirke Corin, MD in 6 months.   Current medicines are reviewed at length with the patient  today.  The patient did not have any concerns regarding medicines.  Elinor Dodge PA-C 02/23/2017 1:36 PM     [facility] [place] [address] [place], [place] [address] [phone]

## 2017-02-23 NOTE — Patient Instructions (Addendum)
Labwork: We will call you with your results from today.   Testing/Procedures: Your physician has recommended that you wear a holter monitor. Holter monitors are medical devices that record the heart's electrical activity. Doctors most often use these monitors to diagnose arrhythmias. Arrhythmias are problems with the speed or rhythm of the heartbeat. The monitor is a small, portable device. You can wear one while you do your normal daily activities. This is usually used to diagnose what is causing palpitations/syncope (passing out).    Follow-Up: Your physician wants you to follow-up in: 6 months with Dr. Kirke Corin. You will receive a reminder letter in the mail two months in advance. If you don't receive a letter, please call our office to schedule the follow-up appointment.  It was a pleasure seeing you today here in the office. Please do not hesitate to give [place] a call back if you have any further questions. [phone]  Beaconsfield Cellar RN, BSN     Holter Monitoring A Holter monitor is a small device that is used to detect abnormal heart rhythms. It clips to your clothing and is connected by wires to flat, sticky disks (electrodes) that attach to your chest. It is worn continuously for 24-48 hours. Follow these instructions at home:  Wear your Holter monitor at all times, even while exercising and sleeping, for as long as directed by your health care provider.  Make sure that the Holter monitor is safely clipped to your clothing or close to your body as recommended by your health care provider.  Do not get the monitor or wires wet.  Do not put body lotion or moisturizer on your chest.  Keep your skin clean.  Keep a diary of your daily activities, such as walking and doing chores. If you feel that your heartbeat is abnormal or that your heart is fluttering or skipping a beat: ? Record what you are doing when it happens. ? Record what time of day the symptoms occur.  Return your Holter  monitor as directed by your health care provider.  Keep all follow-up visits as directed by your health care provider. This is important. Get help right away if:  You feel lightheaded or you faint.  You have trouble breathing.  You feel pain in your chest, upper arm, or jaw.  You feel sick to your stomach and your skin is pale, cool, or damp.  You heartbeat feels unusual or abnormal. This information is not intended to replace advice given to you by your health care provider. Make sure you discuss any questions you have with your health care provider. Document Released: 05/09/2004 Document Revised: 01/17/2016 Document Reviewed: 03/20/2014 Elsevier Interactive Patient Education  [employer].

## 2017-02-24 ENCOUNTER — Other Ambulatory Visit: Payer: Self-pay

## 2017-02-24 DIAGNOSIS — R7989 Other specified abnormal findings of blood chemistry: Secondary | ICD-10-CM

## 2017-02-24 LAB — CBC WITH DIFFERENTIAL/PLATELET
BASOS ABS: 0.1 10*3/uL (ref 0.0–0.2)
Basos: 1 %
EOS (ABSOLUTE): 0.4 10*3/uL (ref 0.0–0.4)
EOS: 5 %
HEMATOCRIT: 49.1 % (ref 37.5–51.0)
Hemoglobin: 16.4 g/dL (ref 13.0–17.7)
IMMATURE GRANULOCYTES: 0 %
Immature Grans (Abs): 0 10*3/uL (ref 0.0–0.1)
LYMPHS ABS: 2.4 10*3/uL (ref 0.7–3.1)
Lymphs: 27 %
MCH: 29.9 pg (ref 26.6–33.0)
MCHC: 33.4 g/dL (ref 31.5–35.7)
MCV: 89 fL (ref 79–97)
MONOS ABS: 0.8 10*3/uL (ref 0.1–0.9)
Monocytes: 9 %
NEUTROS PCT: 58 %
Neutrophils Absolute: 5.1 10*3/uL (ref 1.4–7.0)
PLATELETS: 175 10*3/uL (ref 150–379)
RBC: 5.49 x10E6/uL (ref 4.14–5.80)
RDW: 13.4 % (ref 12.3–15.4)
WBC: 8.8 10*3/uL (ref 3.4–10.8)

## 2017-02-24 LAB — BASIC METABOLIC PANEL
BUN/Creatinine Ratio: 16 (ref 10–24)
BUN: 22 mg/dL (ref 8–27)
CALCIUM: 9.6 mg/dL (ref 8.6–10.2)
CHLORIDE: 102 mmol/L (ref 96–106)
CO2: 25 mmol/L (ref 20–29)
Creatinine, Ser: 1.34 mg/dL — ABNORMAL HIGH (ref 0.76–1.27)
GFR, EST AFRICAN AMERICAN: 60 mL/min/{1.73_m2} (ref >59)
GFR, EST NON AFRICAN AMERICAN: 52 mL/min/{1.73_m2} — AB (ref >59)
Glucose: 101 mg/dL — ABNORMAL HIGH (ref 65–99)
POTASSIUM: 4.5 mmol/L (ref 3.5–5.2)
Sodium: 144 mmol/L (ref 134–144)

## 2017-02-24 LAB — TSH: TSH: 5.15 u[IU]/mL — AB (ref 0.450–4.500)

## 2017-02-24 LAB — MAGNESIUM: Magnesium: 2.2 mg/dL (ref 1.6–2.3)

## 2017-02-27 LAB — SPECIMEN STATUS REPORT

## 2017-02-27 LAB — T4, FREE: Free T4: 1.18 ng/dL (ref 0.82–1.77)

## 2017-03-02 ENCOUNTER — Ambulatory Visit (INDEPENDENT_AMBULATORY_CARE_PROVIDER_SITE_OTHER): Payer: PPO

## 2017-03-02 ENCOUNTER — Other Ambulatory Visit: Payer: Self-pay | Admitting: Cardiovascular Disease

## 2017-03-02 DIAGNOSIS — I493 Ventricular premature depolarization: Secondary | ICD-10-CM

## 2017-03-04 ENCOUNTER — Ambulatory Visit
Admission: RE | Admit: 2017-03-04 | Discharge: 2017-03-04 | Disposition: A | Discharge status: Home / Self Care | Payer: PPO | Source: Ambulatory Visit | Attending: Physician Assistant | Admitting: Physician Assistant

## 2017-03-04 DIAGNOSIS — I493 Ventricular premature depolarization: Secondary | ICD-10-CM | POA: Insufficient documentation

## 2017-03-26 ENCOUNTER — Telehealth: Payer: Self-pay | Admitting: *Deleted

## 2017-03-26 NOTE — Telephone Encounter (Signed)
-----   Message from Duke Salvia, MD sent at 03/26/2017  1:16 PM EDT ----- [place]asia, I got the message from [place]. The best thing probably is just to put him back on my schedule have him come in so we can come up with a plan.

## 2017-03-26 NOTE — Telephone Encounter (Signed)
I called and spoke with the patient regarding Dr. Odessa Fleming recommendation regarding increased PVC burden on his holter.  He was under the impression that he still had follow up with Dr. Kirke Corin on 03/31/17- I advised this had been previously cancelled.  I have scheduled him to see Dr. Graciela Husbands on 8/7 at 11:45 am.  He is agreeable.

## 2017-03-31 ENCOUNTER — Encounter: Payer: Self-pay | Admitting: Internal Medicine

## 2017-03-31 ENCOUNTER — Ambulatory Visit: Payer: PPO | Admitting: Cardiovascular Disease

## 2017-03-31 ENCOUNTER — Ambulatory Visit (INDEPENDENT_AMBULATORY_CARE_PROVIDER_SITE_OTHER): Payer: PPO | Admitting: Internal Medicine

## 2017-03-31 VITALS — BP 100/56 | HR 65 | Ht 69.0 in | Wt 278.5 lb

## 2017-03-31 DIAGNOSIS — I255 Ischemic cardiomyopathy: Secondary | ICD-10-CM | POA: Diagnosis not present

## 2017-03-31 DIAGNOSIS — I493 Ventricular premature depolarization: Secondary | ICD-10-CM | POA: Diagnosis not present

## 2017-03-31 NOTE — Progress Notes (Signed)
Patient Care Team: [name], MD as PCP - General (Family Medicine) [name], MD as Referring Physician (Family Medicine) Lemar Livings Merrily Pew, MD (General Surgery)   HPI  [name] [name] is a 76 y.o. male Seen in follow-up because of frequent PVCs unassociated with palpitations.  He has a history of ischemic heart disease with prior MI. He underwent bypass surgery 10/15. Catheterization 9/16 demonstrated patent grafts.  Because of the PVC burden, he was started on mexiletine. This was associated with interval improvement.   Date PVC  count  10/16 28K  3/17 7K  7/18 31K    DATE TEST    9/16 Cath  3/5 patent grafts T OM1/2  9/16   Echo   EF *30-35 %   12/16    Echo   EF 35-40 %              Past Medical History:  Diagnosis Date  . AAA (abdominal aortic aneurysm) (HCC)   . ACE inhibitor associated hyperkalemia 09/20/2015  . Arthritis   . CAD (coronary artery disease)    a. 03/2014 NSTEMI--> 05/2014 CABG x 5 (LIMA->D1, VG->LAD, VG->OM1, VG->OM2, VG->RPDA);  c. 04/2015 Cath: LM nl, LAD 100ost, 40d, RI 80, LCX 80p/m, OM1 70, OM2 80, RCA 20p, 28m, 90d, VG->dLAD min irregs, VG->OM1 100, VG->OM2 100, VG->RPDA nl, LIMA->D2 nl.  . Chronic systolic CHF (congestive heart failure) (HCC)    a. EF 25-30% by 2D ECHO 04/20/14. Severely dec global hypokinesis. mildly elevated RVSP;  b. 12/2014 Echo: EF 40-45%, mod dil LA, mildly dil RV/RA;  c. 04/2015 Echo: EF 30-35%, sev antsept HK, Gr1 DD, mildly dil RA/LA;  d. 07/2015 Echo: EF 35-40%, ant/antsept HK, Gr1 DD, mildly red RV fxn.  . Diabetes mellitus without complication (HCC)   . Epigastric hernia   . Frequent PVCs    a. 24 hr Holter 06/2015: >28K PVCs accounting for 25% of all beats, rare PAC-->mexilitene started;  b. 07/2015 Holter: 1700 PVC's/24 hrs.  [name] GERD (gastroesophageal reflux disease)   . Gout   . Hyperlipidemia   . Hypertension   . Ischemic cardiomyopathy    a. EF 25-30% 03/2014;  b. EF 40-45% 12/2014.  .  Mitral regurgitation    a. 03/2014 TEE: mild to mod MR.  . Nephrolithiasis   . Neuromuscular disorder (HCC)    DIABETIC NEUROPATHY  . Obesity   . Umbilical hernia     Past Surgical History:  Procedure Laterality Date  . CARDIAC CATHETERIZATION     [facility]  . CARDIAC CATHETERIZATION N/A 05/23/2015   Procedure: Left Heart Cath and Coronary Angiography;  Surgeon: Iran Ouch, MD;  Location: [facility];  Service: Cardiovascular;  Laterality: N/A;  . CARPAL TUNNEL RELEASE Right   . CORONARY ARTERY BYPASS GRAFT N/A 06/06/2014   Procedure: CORONARY ARTERY BYPASS GRAFTING (CABG) x 5 USING LEFT AND RIGHT SAPHENOUS LEG VEIN HARVESTED ENDOSCOPICALLY;  Surgeon: Kerin Perna, MD;  Location: [facility] OR;  Service: Open Heart Surgery;  Laterality: N/A;  . ESOPHAGOGASTRODUODENOSCOPY (EGD) WITH PROPOFOL N/A 02/11/2016   Procedure: ESOPHAGOGASTRODUODENOSCOPY (EGD) WITH PROPOFOL with dialation;  Surgeon: [name], MD;  Location: [facility];  Service: Endoscopy;  Laterality: N/A;  diabetic, oral meds  . INTRAOPERATIVE TRANSESOPHAGEAL ECHOCARDIOGRAM N/A 06/06/2014   Procedure: INTRAOPERATIVE TRANSESOPHAGEAL ECHOCARDIOGRAM;  Surgeon: Kerin Perna, MD;  Location: [facility] OR;  Service: Open Heart Surgery;  Laterality: N/A;  . ROTATOR CUFF REPAIR Right 1996,1997,2000  . TEE WITHOUT  CARDIOVERSION N/A 04/24/2014   Procedure: TRANSESOPHAGEAL ECHOCARDIOGRAM (TEE);  Surgeon: [name], MD;  Location: [facility] ENDOSCOPY;  Service: Cardiovascular;  Laterality: N/A;    Current Outpatient Prescriptions  Medication Sig Dispense Refill  . aspirin EC 81 MG tablet Take 1 tablet (81 mg total) by mouth daily.    [name] atorvastatin (LIPITOR) 40 MG tablet TAKE ONE TABLET BY MOUTH ONCE DAILY 90 tablet 3  . carvedilol (COREG) 12.5 MG tablet Take 1 tablet (12.5 mg total) by mouth 2 (two) times daily.    . colchicine 0.6 MG tablet Take 0.6 mg by mouth daily as needed (for gout flareup).     .  diphenhydramine-acetaminophen (TYLENOL PM) 25-500 MG TABS tablet Take 1 tablet by mouth at bedtime as needed.    . furosemide (LASIX) 40 MG tablet TAKE ONE TABLET BY MOUTH ONCE DAILY 30 tablet 6  . gabapentin (NEURONTIN) 300 MG capsule Take 300 mg by mouth 4 (four) times daily.     [name] losartan (COZAAR) 25 MG tablet Take 1 tablet (25 mg total) by mouth daily. 90 tablet 3  . mexiletine (MEXITIL) 150 MG capsule TAKE 1 CAPSULE BY MOUTH TWICE DAILY 60 capsule 0  . nitroGLYCERIN (NITROSTAT) 0.4 MG SL tablet Place 0.4 mg under the tongue every 5 (five) minutes as needed for chest pain.     No current facility-administered medications for this visit.     Allergies  Allergen Reactions  . Glimepiride     Unusual sweating and irregular heart beat      Review of Systems negative except from HPI and PMH  Physical Exam BP (!) 100/56 (BP Location: Left Arm, Patient Position: Sitting, Cuff Size: Large)   Pulse 65   Ht 5\' 9"  (1.753 m)   Wt 278 lb 8 oz (126.3 kg)   BMI 41.13 kg/m  Well developed and well nourished in no acute distress HENT normal E scleral and icterus clear Neck Supple JVP flat; carotids brisk and full Clear to ausculation  Regular rate and rhythm, no murmurs gallops or rub Soft with active bowel sounds No clubbing cyanosis  Edema Alert and oriented, grossly normal motor and sensory function Skin Warm and Dry  ECG demonstrates sinus rhythm at 65 Intervals 28/10/36 PVCs trigeminy--Penta Gemini with an intrinsicoid deflection 100 ms  Assessment and  Plan  PVCs right bundle left axis deviation  Ischemic cardiomyopathy/component of ectopy cardiomyopathy  Bypass surgery   Hyperkalemia on ACE inhibitor    The patient has a high PVC frequency. His ejection fraction has been surprisingly stable. Perhaps this is related to the relatively long coupling interval. At this point it is not clear whether the cardiomyopathy is related to his ischemic heart disease or to his  PVCs. Clearly mexiletine is not advantageous in either needs to be uptitrated or discontinued. We will wait and see what his ejection fraction looks like prior to making that determination.  We spent more than 50% of our >25 min visit in face to face counseling regarding the above    Current medicines are reviewed at length with the patient today .  The patient does not  have concerns regarding medicines.

## 2017-03-31 NOTE — Patient Instructions (Signed)
Medication Instructions: - Your physician recommends that you continue on your current medications as directed. Please refer to the Current Medication list given to you today.  Labwork: - none ordered  Procedures/Testing: - Your physician has requested that you have an echocardiogram. Echocardiography is a painless test that uses sound waves to create images of your heart. It provides your doctor with information about the size and shape of your heart and how well your heart's chambers and valves are working. This procedure takes approximately one hour. There are no restrictions for this procedure.  Follow-Up: - pending echo results  Any Additional Special Instructions Will Be Listed Below (If Applicable).     If you need a refill on your cardiac medications before your next appointment, please call your pharmacy.   

## 2017-04-02 ENCOUNTER — Other Ambulatory Visit: Payer: Self-pay | Admitting: Cardiovascular Disease

## 2017-04-03 ENCOUNTER — Other Ambulatory Visit: Payer: Self-pay

## 2017-04-03 ENCOUNTER — Ambulatory Visit (INDEPENDENT_AMBULATORY_CARE_PROVIDER_SITE_OTHER): Payer: PPO

## 2017-04-03 ENCOUNTER — Telehealth: Payer: Self-pay | Admitting: Cardiovascular Disease

## 2017-04-03 DIAGNOSIS — I493 Ventricular premature depolarization: Secondary | ICD-10-CM

## 2017-04-03 NOTE — Telephone Encounter (Signed)
Pt in office for echo. HR upper 30s per pulse ox and echo. BP 140/70, 95% on room air. Pt denies any sx.  EKG done in office; shows NSR w/PVCs Dr. Kirke Corin reviewed, s/w pt in room. Advised to continue medications as prescribed and await echo results.  Pt agreeable.

## 2017-04-14 ENCOUNTER — Other Ambulatory Visit: Payer: Self-pay

## 2017-04-14 ENCOUNTER — Telehealth: Payer: Self-pay | Admitting: Cardiovascular Disease

## 2017-04-14 MED ORDER — MEXILETINE HCL 150 MG PO CAPS: 300.0000 mg | ORAL_CAPSULE | Freq: Two times a day (BID) | ORAL | 3 refills | Status: DC

## 2017-04-14 NOTE — Telephone Encounter (Signed)
Reviewed echo results w/pt while in the office. See results note

## 2017-04-14 NOTE — Telephone Encounter (Signed)
PT is here and wishes to speak with nurse about test results Is willing to wait

## 2017-04-28 DIAGNOSIS — E1142 Type 2 diabetes mellitus with diabetic polyneuropathy: Secondary | ICD-10-CM | POA: Diagnosis not present

## 2017-05-04 DIAGNOSIS — I714 Abdominal aortic aneurysm, without rupture: Secondary | ICD-10-CM | POA: Diagnosis not present

## 2017-05-04 DIAGNOSIS — M109 Gout, unspecified: Secondary | ICD-10-CM | POA: Diagnosis not present

## 2017-05-04 DIAGNOSIS — Z6841 Body Mass Index (BMI) 40.0 and over, adult: Secondary | ICD-10-CM | POA: Diagnosis not present

## 2017-05-04 DIAGNOSIS — E119 Type 2 diabetes mellitus without complications: Secondary | ICD-10-CM | POA: Diagnosis not present

## 2017-05-05 ENCOUNTER — Other Ambulatory Visit: Payer: Self-pay

## 2017-05-05 ENCOUNTER — Ambulatory Visit (INDEPENDENT_AMBULATORY_CARE_PROVIDER_SITE_OTHER): Payer: PPO

## 2017-05-05 VITALS — BP 138/70 | HR 76

## 2017-05-05 DIAGNOSIS — I493 Ventricular premature depolarization: Secondary | ICD-10-CM | POA: Diagnosis not present

## 2017-05-05 NOTE — Patient Instructions (Signed)
1.) Reason for visit: EKG  2.) Name of MD requesting visit: Dr. Graciela Husbands  3.) H&P: Pt has history of frequent PVCs. Echo 8/12  ROS related to problem: Mexitil 300mg  increased to BID on August 21. Prolonged EKG today.  4.) Assessment and plan per MD: EKG reviewed by Dr. Graciela Husbands who instructs pt to have repeat echo and follow up in 2 months. No medication changes needed at this time.

## 2017-05-06 ENCOUNTER — Other Ambulatory Visit: Payer: Self-pay | Admitting: Family Medicine

## 2017-05-06 ENCOUNTER — Other Ambulatory Visit: Payer: Self-pay

## 2017-05-06 DIAGNOSIS — I714 Abdominal aortic aneurysm, without rupture, unspecified: Secondary | ICD-10-CM

## 2017-05-06 DIAGNOSIS — I493 Ventricular premature depolarization: Secondary | ICD-10-CM

## 2017-05-18 ENCOUNTER — Ambulatory Visit
Admission: RE | Admit: 2017-05-18 | Discharge: 2017-05-18 | Disposition: A | Discharge status: Home / Self Care | Payer: PPO | Source: Ambulatory Visit | Attending: Family Medicine | Admitting: Family Medicine

## 2017-05-18 DIAGNOSIS — I714 Abdominal aortic aneurysm, without rupture, unspecified: Secondary | ICD-10-CM

## 2017-05-19 DIAGNOSIS — J208 Acute bronchitis due to other specified organisms: Secondary | ICD-10-CM | POA: Diagnosis not present

## 2017-05-25 ENCOUNTER — Other Ambulatory Visit: Payer: Self-pay | Admitting: Cardiovascular Disease

## 2017-07-06 ENCOUNTER — Other Ambulatory Visit: Payer: PPO

## 2017-07-07 ENCOUNTER — Other Ambulatory Visit: Payer: Self-pay

## 2017-07-07 ENCOUNTER — Ambulatory Visit (INDEPENDENT_AMBULATORY_CARE_PROVIDER_SITE_OTHER): Payer: PPO

## 2017-07-07 DIAGNOSIS — I493 Ventricular premature depolarization: Secondary | ICD-10-CM | POA: Diagnosis not present

## 2017-07-09 ENCOUNTER — Ambulatory Visit: Payer: PPO | Admitting: Internal Medicine

## 2017-07-09 ENCOUNTER — Encounter: Payer: Self-pay | Admitting: Internal Medicine

## 2017-07-09 VITALS — BP 120/62 | HR 71 | Ht 72.0 in | Wt 276.0 lb

## 2017-07-09 DIAGNOSIS — I493 Ventricular premature depolarization: Secondary | ICD-10-CM | POA: Diagnosis not present

## 2017-07-09 DIAGNOSIS — I10 Essential (primary) hypertension: Secondary | ICD-10-CM

## 2017-07-09 NOTE — Patient Instructions (Signed)
Medication Instructions:  Your physician recommends that you continue on your current medications as directed. Please refer to the Current Medication list given to you today.   Labwork: none  Testing/Procedures: Your physician has recommended that you wear a 24 HOUR holter monitor. Holter monitors are medical devices that record the heart's electrical activity. Doctors most often use these monitors to diagnose arrhythmias. Arrhythmias are problems with the speed or rhythm of the heartbeat. The monitor is a small, portable device. You can wear one while you do your normal daily activities. This is usually used to diagnose what is causing palpitations/syncope (passing out).    Follow-Up: Your physician wants you to follow-up in: 6 MONTHS WITH DR Graciela Husbands. You will receive a reminder letter in the mail two months in advance. If you don't receive a letter, please call our office to schedule the follow-up appointment.   If you need a refill on your cardiac medications before your next appointment, please call your pharmacy.    Holter Monitoring A Holter monitor is a small device that is used to detect abnormal heart rhythms. It clips to your clothing and is connected by wires to flat, sticky disks (electrodes) that attach to your chest. It is worn continuously for 24-48 hours. Follow these instructions at home:  Wear your Holter monitor at all times, even while exercising and sleeping, for as long as directed by your health care provider.  Make sure that the Holter monitor is safely clipped to your clothing or close to your body as recommended by your health care provider.  Do not get the monitor or wires wet.  Do not put body lotion or moisturizer on your chest.  Keep your skin clean.  Keep a diary of your daily activities, such as walking and doing chores. If you feel that your heartbeat is abnormal or that your heart is fluttering or skipping a beat: ? Record what you are doing when it  happens. ? Record what time of day the symptoms occur.  Return your Holter monitor as directed by your health care provider.  Keep all follow-up visits as directed by your health care provider. This is important. Get help right away if:  You feel lightheaded or you faint.  You have trouble breathing.  You feel pain in your chest, upper arm, or jaw.  You feel sick to your stomach and your skin is pale, cool, or damp.  You heartbeat feels unusual or abnormal. This information is not intended to replace advice given to you by your health care provider. Make sure you discuss any questions you have with your health care provider. Document Released: 05/09/2004 Document Revised: 01/17/2016 Document Reviewed: 03/20/2014 Elsevier Interactive Patient Education  [employer].

## 2017-07-09 NOTE — Progress Notes (Signed)
Patient Care Team: Jerl MinaHedrick, James, MD as PCP - General (Family Medicine) Jerl MinaHedrick, James, MD as Referring Physician (Family Medicine) Lemar LivingsByrnett, Merrily PewJeffrey W, MD (General Surgery)   HPI  [name] [name] is a 76 y.o. male Seen in follow-up because of frequent PVCs unassociated with palpitations.  He has a history of ischemic heart disease with prior MI. He underwent bypass surgery 10/15. Catheterization 9/16 demonstrated patent grafts.  Because of the PVC burden, he was started on mexiletine. This was associated with interval improvement. As Below  His PCP has not noted PVC on prolonged auscultation   Some improvement in DOE,  No edema  No chest pain  No palps or syncope  Date PVC  count  10/16 28K  3/17 7K  7/18 31K    DATE TEST    9/16 Cath  3/5 patent grafts T OM1/2  9/16   Echo   EF *30-35 %   12/16    Echo   EF 35-40 %   8/18 Echo EF 35-40% Increased Mex  11/18 Echo EF 35-40%    Date Cr K TSH  11/16 1.51 6.0   7/18 1.34 4.5 5.15  9/18 1.3 4.4    Personally reviewed       Past Medical History:  Diagnosis Date  . AAA (abdominal aortic aneurysm) (HCC)   . ACE inhibitor associated hyperkalemia 09/20/2015  . Arthritis   . CAD (coronary artery disease)    a. 03/2014 NSTEMI--> 05/2014 CABG x 5 (LIMA->D1, VG->LAD, VG->OM1, VG->OM2, VG->RPDA);  c. 04/2015 Cath: LM nl, LAD 100ost, 40d, RI 80, LCX 80p/m, OM1 70, OM2 80, RCA 20p, 10271m, 90d, VG->dLAD min irregs, VG->OM1 100, VG->OM2 100, VG->RPDA nl, LIMA->D2 nl.  . Chronic systolic CHF (congestive heart failure) (HCC)    a. EF 25-30% by 2D ECHO 04/20/14. Severely dec global hypokinesis. mildly elevated RVSP;  b. 12/2014 Echo: EF 40-45%, mod dil LA, mildly dil RV/RA;  c. 04/2015 Echo: EF 30-35%, sev antsept HK, Gr1 DD, mildly dil RA/LA;  d. 07/2015 Echo: EF 35-40%, ant/antsept HK, Gr1 DD, mildly red RV fxn.  . Diabetes mellitus without complication (HCC)   . Epigastric hernia   . Frequent PVCs    a. 24 hr Holter 06/2015:  >28K PVCs accounting for 25% of all beats, rare PAC-->mexilitene started;  b. 07/2015 Holter: 1700 PVC's/24 hrs.  [name]. GERD (gastroesophageal reflux disease)   . Gout   . Hyperlipidemia   . Hypertension   . Ischemic cardiomyopathy    a. EF 25-30% 03/2014;  b. EF 40-45% 12/2014.  . Mitral regurgitation    a. 03/2014 TEE: mild to mod MR.  . Nephrolithiasis   . Neuromuscular disorder (HCC)    DIABETIC NEUROPATHY  . Obesity   . Umbilical hernia     Past Surgical History:  Procedure Laterality Date  . CARDIAC CATHETERIZATION     [facility]  . CARDIAC CATHETERIZATION N/A 05/23/2015   Procedure: Left Heart Cath and Coronary Angiography;  Surgeon: Iran OuchMuhammad A Arida, MD;  Location: [facility];  Service: Cardiovascular;  Laterality: N/A;  . CARPAL TUNNEL RELEASE Right   . CORONARY ARTERY BYPASS GRAFT N/A 06/06/2014   Procedure: CORONARY ARTERY BYPASS GRAFTING (CABG) x 5 USING LEFT AND RIGHT SAPHENOUS LEG VEIN HARVESTED ENDOSCOPICALLY;  Surgeon: Kerin PernaPeter Van Trigt, MD;  [facility];  Service: Open Heart Surgery;  Laterality: N/A;  . ESOPHAGOGASTRODUODENOSCOPY (EGD) WITH PROPOFOL N/A 02/11/2016   Procedure: ESOPHAGOGASTRODUODENOSCOPY (EGD) WITH PROPOFOL with dialation;  Surgeon: [name], MD;  Location: [facility];  Service: Endoscopy;  Laterality: N/A;  diabetic, oral meds  . INTRAOPERATIVE TRANSESOPHAGEAL ECHOCARDIOGRAM N/A 06/06/2014   Procedure: INTRAOPERATIVE TRANSESOPHAGEAL ECHOCARDIOGRAM;  Surgeon: Kerin Perna, MD;  Location: [facility] OR;  Service: Open Heart Surgery;  Laterality: N/A;  . ROTATOR CUFF REPAIR Right 1996,1997,2000  . TEE WITHOUT CARDIOVERSION N/A 04/24/2014   Procedure: TRANSESOPHAGEAL ECHOCARDIOGRAM (TEE);  Surgeon: [name], MD;  Location: [facility] ENDOSCOPY;  Service: Cardiovascular;  Laterality: N/A;    Current Outpatient Medications  Medication Sig Dispense Refill  . aspirin EC 81 MG tablet Take 1 tablet (81 mg total) by mouth daily.    [name] atorvastatin (LIPITOR)  40 MG tablet TAKE ONE TABLET BY MOUTH ONCE DAILY 90 tablet 3  . carvedilol (COREG) 12.5 MG tablet Take 1 tablet (12.5 mg total) by mouth 2 (two) times daily.    . colchicine 0.6 MG tablet Take 0.6 mg by mouth daily as needed (for gout flareup).     . diphenhydramine-acetaminophen (TYLENOL PM) 25-500 MG TABS tablet Take 1 tablet by mouth at bedtime as needed.    . furosemide (LASIX) 40 MG tablet TAKE 1 TABLET BY MOUTH ONCE DAILY 90 tablet 2  . gabapentin (NEURONTIN) 300 MG capsule Take 300 mg by mouth 4 (four) times daily.     [name] losartan (COZAAR) 25 MG tablet Take 1 tablet (25 mg total) by mouth daily. 90 tablet 3  . mexiletine (MEXITIL) 150 MG capsule Take 2 capsules (300 mg total) by mouth 2 (two) times daily. 120 capsule 3  . nitroGLYCERIN (NITROSTAT) 0.4 MG SL tablet Place 0.4 mg under the tongue every 5 (five) minutes as needed for chest pain.     No current facility-administered medications for this visit.     Allergies  Allergen Reactions  . Glimepiride     Unusual sweating and irregular heart beat   /   Review of Systems negative except from HPI and PMH  Physical Exam Ht 6' (1.829 m)   Wt 276 lb (125.2 kg)   BMI 37.43 kg/m  Well developed and nourished in no acute distress HENT normal Neck supple with JVP-flat Clear Regular rate and rhythm, no murmurs or gallops Abd-soft with active BS No Clubbing cyanosis edema Skin-warm and dry A & Oriented  Grossly normal sensory and motor function   ECG demonstrates sinus rhythm at 71 19/09/38 IMI  Assessment and  Plan  PVCs right bundle left axis deviation  Ischemic cardiomyopathy/component of ectopy cardiomyopathy  Bypass surgery  CHF chronic systolic    Hyperkalemia on ACE inhibitor   No palplable PVCs to me or apparently to his PCP-- will repeat 24 monitor  If indeed PVCs are suppressed, then it is likely the dominant component of his cardiomypathy is ischemic and augmented Guideline directed medical therapy  is goal  He may be candidate for entresto-- will discuss with Dr MA. Not candidate for aldactone 2/2 hyperkalemia  Will try and simplify the mex regime to bid   Euvolemic continue current meds  Check TSH as    Current medicines are reviewed at length with the patient today .  The patient does not  have concerns regarding medicines.

## 2017-07-27 ENCOUNTER — Ambulatory Visit (INDEPENDENT_AMBULATORY_CARE_PROVIDER_SITE_OTHER): Payer: PPO

## 2017-07-27 DIAGNOSIS — I10 Essential (primary) hypertension: Secondary | ICD-10-CM

## 2017-07-27 DIAGNOSIS — I493 Ventricular premature depolarization: Secondary | ICD-10-CM

## 2017-07-30 ENCOUNTER — Ambulatory Visit
Admission: RE | Admit: 2017-07-30 | Discharge: 2017-07-30 | Disposition: A | Discharge status: Home / Self Care | Payer: PPO | Source: Ambulatory Visit | Attending: Internal Medicine | Admitting: Internal Medicine

## 2017-07-30 DIAGNOSIS — I10 Essential (primary) hypertension: Secondary | ICD-10-CM | POA: Diagnosis not present

## 2017-07-30 DIAGNOSIS — I493 Ventricular premature depolarization: Secondary | ICD-10-CM | POA: Insufficient documentation

## 2017-08-21 ENCOUNTER — Other Ambulatory Visit: Payer: Self-pay | Admitting: Nurse Practitioner

## 2017-08-21 NOTE — Telephone Encounter (Signed)
This is a [place] pt 

## 2017-08-26 NOTE — Progress Notes (Signed)
Cardiology Office Note Date:  08/31/2017  Patient ID:  [name] [name], [name] [birth year], MRN [id number] PCP:  [name], MD  Cardiologist:  Dr. Kirke Corin, MD Electrophysiologist: Dr. Graciela Husbands, MD    Chief Complaint: Follow up ICM/frequent PVCs  History of Present Illness: [name] [name] is a 77 y.o. male with history of CAD s/p 5 vessel CABG in 05/2014 in the setting of NSTEMI with acute systolic CHF mixed ICM and NICM, AAA, mild to moderate mitral regurgitation, ACEi-induced hyperkalemia, frequent PVCs, DM2 with diabetic neuropathy, HTN, HLD, and obesity who presents for routine follow up of his ICM and frequent PVCs.  Most recent cardiac cath in 04/2015 showed 3 of 5 patent grafts with new occlusions of the vein grafts to the OM1 and OM2. The native marginals were small and not felt to be amenable to PCI. Medical therapy was advised. He had worsening LV systolic function to 30-35% in 04/2015 due to very frequent PVCs with Holter monitoring in 06/2015 demonstrating 28,000 PVCs and was placed on mexiletine therapy at the recommendation of EP. Follow up Holter monitor in 07/2015 showed significant improvement in PVC burden down to 1724. Follow-up echocardiography in 07/2015 showed modestly improved LV function, with an EF of 35-40%. Unfortunately, his mexiletine was stopped due to poor tolerance and his Coreg dose was increased. Though he did self restart the mexiletine on his own shortly after it was discontinued and was tolerated without issues. He underwent repeat Holter monitoring in 10/2015 with a decreased dose of mexiletine that showed 7001 PVCs. He was seen by Dr. Kirke Corin on 05/30/16 and doing well at that time. I saw him on 7/2/018, and he was doing well. Repeat 24-hour Holter monitoring at that time showed 31,000 PVCs. He was referred to Dr. Graciela Husbands given his PVC burden and underwent repeat echo on 04/03/2017 that showed stable EF of 35-40%, moderate biatrial enlargement, and frequent PVCs noted  during the exam. His mexilitine was doubled per EP. Follow up RN visit for rhythm strip on 05/05/2017 showed NSR without PVCs. Repeat echo on 07/07/2017 showed stable EF of 35-40%, Gr2DD, mildly dilated left atrium, moderately dilated RV with moderately reduced RV systolic function, moderately dilated right atrium. In follow up with EP on 07/09/2017, without palpable PVC by EP. Repeat 24-hour Holter on 07/27/2017 showed 9,240 PVCs (9%) which was improved from prior. EP has felt the patient's cardiomyopathy is ischemic rather than PVC burden. He follows up today for possible transition from losartan to [facility].   He comes in doing well today. Continues to walk daily without symptoms. Has a scale at home, though this is broken. He would like to decrease his mexiletine as 4 tabs (150 mg each) leads to GI upset. This has happened to him in the past as well, leading to decrease of dose. He has previously tolerated 3 tabs daily and has even self decreased to 3 tabs daily at times. He is otherwise without complaints today.     Past Medical History:  Diagnosis Date  . AAA (abdominal aortic aneurysm) (HCC)   . ACE inhibitor associated hyperkalemia 09/20/2015  . Arthritis   . CAD (coronary artery disease)    a. 03/2014 NSTEMI--> 05/2014 CABG x 5 (LIMA->D1, VG->LAD, VG->OM1, VG->OM2, VG->RPDA);  c. 04/2015 Cath: LM nl, LAD 100ost, 40d, RI 80, LCX 80p/m, OM1 70, OM2 80, RCA 20p, 40m, 90d, VG->dLAD min irregs, VG->OM1 100, VG->OM2 100, VG->RPDA nl, LIMA->D2 nl.  . Chronic systolic CHF (congestive heart failure) (HCC)  a. EF 25-30% by 2D ECHO 04/20/14. Severely dec global hypokinesis. mildly elevated RVSP;  b. 12/2014 Echo: EF 40-45%, mod dil LA, mildly dil RV/RA;  c. 04/2015 Echo: EF 30-35%, sev antsept HK, Gr1 DD, mildly dil RA/LA;  d. 07/2015 Echo: EF 35-40%, ant/antsept HK, Gr1 DD, mildly red RV fxn.  . Diabetes mellitus without complication (HCC)   . Epigastric hernia   . Frequent PVCs    a. 24 hr Holter 06/2015:  >28K PVCs accounting for 25% of all beats, rare PAC-->mexilitene started;  b. 07/2015 Holter: 1700 PVC's/24 hrs.  [name] GERD (gastroesophageal reflux disease)   . Gout   . Hyperlipidemia   . Hypertension   . Ischemic cardiomyopathy    a. EF 25-30% 03/2014;  b. EF 40-45% 12/2014.  . Mitral regurgitation    a. 03/2014 TEE: mild to mod MR.  . Nephrolithiasis   . Neuromuscular disorder (HCC)    DIABETIC NEUROPATHY  . Obesity   . Umbilical hernia     Past Surgical History:  Procedure Laterality Date  . CARDIAC CATHETERIZATION     [facility]  . CARDIAC CATHETERIZATION N/A 05/23/2015   Procedure: Left Heart Cath and Coronary Angiography;  Surgeon: Iran Ouch, MD;  Location: [facility] INV[facility]IVE [facility] LAB;  Service: Cardiovascular;  Laterality: N/A;  . CARPAL TUNNEL RELEASE Right   . CORONARY ARTERY BYPASS GRAFT N/A 06/06/2014   Procedure: CORONARY ARTERY BYPASS GRAFTING (CABG) x 5 USING LEFT AND RIGHT SAPHENOUS LEG VEIN HARVESTED ENDOSCOPICALLY;  Surgeon: Kerin Perna, MD;  Location: [facility] OR;  Service: Open Heart Surgery;  Laterality: N/A;  . ESOPHAGOGASTRODUODENOSCOPY (EGD) WITH PROPOFOL N/A 02/11/2016   Procedure: ESOPHAGOGASTRODUODENOSCOPY (EGD) WITH PROPOFOL with dialation;  Surgeon: [name], MD;  Location: [facility];  Service: Endoscopy;  Laterality: N/A;  diabetic, oral meds  . INTRAOPERATIVE TRANSESOPHAGEAL ECHOCARDIOGRAM N/A 06/06/2014   Procedure: INTRAOPERATIVE TRANSESOPHAGEAL ECHOCARDIOGRAM;  Surgeon: Kerin Perna, MD;  Location: [facility] OR;  Service: Open Heart Surgery;  Laterality: N/A;  . ROTATOR CUFF REPAIR Right 1996,1997,2000  . TEE WITHOUT CARDIOVERSION N/A 04/24/2014   Procedure: TRANSESOPHAGEAL ECHOCARDIOGRAM (TEE);  Surgeon: [name], MD;  Location: [facility] END[facility]COPY;  Service: Cardiovascular;  Laterality: N/A;    Current Meds  Medication Sig  . aspirin EC 81 MG tablet Take 1 tablet (81 mg total) by mouth daily.  [name] atorvastatin (LIPITOR) 40 MG tablet TAKE ONE TABLET BY  MOUTH ONCE DAILY  . carvedilol (COREG) 12.5 MG tablet Take 1 tablet (12.5 mg total) by mouth 2 (two) times daily.  . carvedilol (COREG) 12.5 MG tablet TAKE ONE TABLET BY MOUTH TWICE DAILY  . colchicine 0.6 MG tablet Take 0.6 mg by mouth daily as needed (for gout flareup).   . diphenhydramine-acetaminophen (TYLENOL PM) 25-500 MG TABS tablet Take 1 tablet by mouth at bedtime as needed.  . furosemide (LASIX) 40 MG tablet TAKE 1 TABLET BY MOUTH ONCE DAILY  . gabapentin (NEURONTIN) 300 MG capsule Take 300 mg by mouth 4 (four) times daily.   [name] mexiletine (MEXITIL) 150 MG capsule Take 2 capsules (300 mg total) by mouth 2 (two) times daily. (Patient taking differently: Take 150 mg by mouth 4 (four) times daily. )  . nitroGLYCERIN (NITROSTAT) 0.4 MG SL tablet Place 1 tablet (0.4 mg total) under the tongue every 5 (five) minutes as needed for chest pain.  . [DISCONTINUED] losartan (COZAAR) 25 MG tablet Take 1 tablet (25 mg total) by mouth daily.  . [DISCONTINUED] nitroGLYCERIN (NITROSTAT) 0.4 MG SL  tablet Place 0.4 mg under the tongue every 5 (five) minutes as needed for chest pain.    Allergies:   Glimepiride   Social History:  The patient  reports that he has quit smoking. His smoking use included cigarettes. He has a 25.00 pack-year smoking history. He has quit using smokeless tobacco. His smokeless tobacco use included chew. He reports that he does not drink alcohol or use drugs.   Family History:  The patient's family history includes Colon polyps in his brother; Lung cancer in his brother; Stroke in his father; Throat cancer in his father.  ROS:   Review of Systems  Constitutional: Negative for chills, diaphoresis, fever, malaise/fatigue and weight loss.  HENT: Negative for congestion.   Eyes: Negative for discharge and redness.  Respiratory: Negative for cough, hemoptysis, sputum production, shortness of breath and wheezing.   Cardiovascular: Negative for chest pain, palpitations, orthopnea,  claudication, leg swelling and PND.  Gastrointestinal: Positive for heartburn and nausea. Negative for abdominal pain, blood in stool, melena and vomiting.  Genitourinary: Negative for hematuria.  Musculoskeletal: Negative for falls and myalgias.  Skin: Negative for rash.  Neurological: Negative for dizziness, tingling, tremors, sensory change, speech change, focal weakness, loss of consciousness and weakness.  Endo/Heme/Allergies: Does not bruise/bleed easily.  Psychiatric/Behavioral: Negative for substance abuse. The patient is not nervous/anxious.   All other systems reviewed and are negative.    PHYSICAL EXAM:  VS:  BP 110/60 (BP Location: Left Arm, Patient Position: Sitting, Cuff Size: Large)   Pulse 72   Ht 5\' 9"  (1.753 m)   Wt 260 lb 12 oz (118.3 kg)   BMI 38.51 kg/m  BMI: Body mass index is 38.51 kg/m.  Physical Exam  Constitutional: He is oriented to person, place, and time. He appears well-developed and well-nourished.  HENT:  Head: Normocephalic and atraumatic.  Eyes: Right eye exhibits no discharge. Left eye exhibits no discharge.  Neck: Normal range of motion. No JVD present.  Cardiovascular: Normal rate, regular rhythm, S1 normal and S2 normal. Exam reveals no distant heart sounds, no friction rub, no midsystolic click and no opening snap.  Murmur heard. High-pitched blowing holosystolic murmur is present with a grade of 2/6 at the apex. Pulmonary/Chest: Effort normal and breath sounds normal. No respiratory distress. He has no decreased breath sounds. He has no wheezes. He has no rales. He exhibits no tenderness.  Abdominal: Soft. He exhibits no distension. There is no tenderness.  Musculoskeletal: He exhibits no edema.  Neurological: He is alert and oriented to person, place, and time.  Skin: Skin is warm and dry. No cyanosis. Nails show no clubbing.  Psychiatric: He has a normal mood and affect. His speech is normal and behavior is normal. Judgment and thought  content normal.     EKG:  Was ordered and interpreted by me today. Shows NSR, 72 bpm, occasional PVCs, prior inferior infarct, poor R wave progression, nonspecific st/t changes  Recent Labs: 02/23/2017: BUN 22; Creatinine, Ser 1.34; Hemoglobin 16.4; Magnesium 2.2; Platelets 175; Potassium 4.5; Sodium 144; TSH 5.150  No results found for requested labs within last 8760 hours.   CrCl cannot be calculated (Patient's most recent lab result is older than the maximum 21 days allowed.).   Wt Readings from Last 3 Encounters:  08/31/17 260 lb 12 oz (118.3 kg)  07/09/17 276 lb (125.2 kg)  03/31/17 278 lb 8 oz (126.3 kg)     Other studies reviewed: Additional studies/records reviewed today include: summarized above  ASSESSMENT  AND PLAN:  1. Chronic systolic CHF/mixed ICM and NICM: He does not appear grossly volume overloaded at this time. His EF has not improved with improvement in his PVC burden on mexiletine, leading EP to feel that his cardiomyopathy is not solely related to PVC burden and likely mixed in the setting of ICM and NICM. We stopped his losartan and he will start Entresto 24/26 mg bid (has not been on ACEi, so no need for wash out). He has had ACEi-induced hyperkalemia so we will check a bmet today and again in 1 week to assess for stability on Entresto (has tolerated ARB without issues). I doubt we will be able to titrate him much further on Entresto given his relative soft BP with a SBP of 110 mmHg today. Continue Coreg 12.5 mg bid. Not on spironolactone given prior hyperkalemia. Advised patient to pick up a scale to weight daily.   2. Frequent PVCs: Improved on mexiletine 150 mg 4 tabs daily. Notes worsening GI upset on 4 tabs daily (has noted this in the past as well). Has tolerated 3 tabs daily without issues. Will decrease mexiletine to 150 mg 3 tabs daily and forward to EP for review. Coreg as above.   3. CAD: No symptoms concerning for ischemia at this time. Continue current  medications.   Disposition: F/u with Dr. Kirke Corin in 3 months.   Current medicines are reviewed at length with the patient today.  The patient did not have any concerns regarding medicines.  [name] Dodge PA-C 08/31/2017 4:34 PM     [facility] [place] [address] [place], [place] [address] [phone]

## 2017-08-27 DIAGNOSIS — E119 Type 2 diabetes mellitus without complications: Secondary | ICD-10-CM | POA: Diagnosis not present

## 2017-08-31 ENCOUNTER — Telehealth: Payer: Self-pay | Admitting: Physician Assistant

## 2017-08-31 ENCOUNTER — Encounter: Payer: Self-pay | Admitting: Physician Assistant

## 2017-08-31 ENCOUNTER — Ambulatory Visit: Payer: PPO | Admitting: Physician Assistant

## 2017-08-31 VITALS — BP 110/60 | HR 72 | Ht 69.0 in | Wt 260.8 lb

## 2017-08-31 DIAGNOSIS — I2510 Atherosclerotic heart disease of native coronary artery without angina pectoris: Secondary | ICD-10-CM

## 2017-08-31 DIAGNOSIS — I255 Ischemic cardiomyopathy: Secondary | ICD-10-CM | POA: Diagnosis not present

## 2017-08-31 DIAGNOSIS — I5022 Chronic systolic (congestive) heart failure: Secondary | ICD-10-CM

## 2017-08-31 DIAGNOSIS — I493 Ventricular premature depolarization: Secondary | ICD-10-CM | POA: Diagnosis not present

## 2017-08-31 DIAGNOSIS — I428 Other cardiomyopathies: Secondary | ICD-10-CM

## 2017-08-31 MED ORDER — NITROGLYCERIN 0.4 MG SL SUBL: 0.4000 mg | SUBLINGUAL_TABLET | SUBLINGUAL | 3 refills | Status: DC | PRN

## 2017-08-31 MED ORDER — MEXILETINE HCL 150 MG PO CAPS: 150.0000 mg | ORAL_CAPSULE | Freq: Three times a day (TID) | ORAL | 3 refills | Status: DC

## 2017-08-31 MED ORDER — SACUBITRIL-VALSARTAN 24-26 MG PO TABS: 1.0000 | ORAL_TABLET | Freq: Two times a day (BID) | ORAL | 5 refills | Status: DC

## 2017-08-31 NOTE — Telephone Encounter (Signed)
S/w significant other, ok per DPR. She wanted to clarify how patient is to take Mexitil as they thought patient was to take 1 cap three times a day.  S/w Eula Listen, PA who advised patient to take Mexitil 1 capsule (150 mg) by  Mouth three times a day.  She verbalized understanding. Rx changed in Epic.

## 2017-08-31 NOTE — Telephone Encounter (Signed)
Please call regarding change in Mexitil.

## 2017-08-31 NOTE — Patient Instructions (Addendum)
Medication Instructions:  Your physician has recommended you make the following change in your medication:  STOP taking losartan START taking entresto 24-26 mg twice daily    Labwork: BMET today  Testing/Procedures: none  Follow-Up: Your physician recommends that you schedule a follow-up appointment in: 3 months with Dr. Kirke Corin.    Any Other Special Instructions Will Be Listed Below (If Applicable).   Samples of this drug were given to the patient, quantity 1 box, Lot Number [id number] Exp: 6/21   If you need a refill on your cardiac medications before your next appointment, please call your pharmacy.  Sacubitril; Valsartan oral tablet What is this medicine? SACUBITRIL; VALSARTAN (sak UE bi tril; val SAR tan) is a combination of 2 drugs used to reduce the risk of death and hospitalizations in people with long-lasting heart failure. It is usually used with other medicines to treat heart failure. This medicine may be used for other purposes; ask your health care provider or pharmacist if you have questions. COMMON BRAND NAME(S): Entresto What should I tell my health care provider before I take this medicine? They need to know if you have any of these conditions: -diabetes and take a medicine that contains aliskiren -kidney disease -liver disease -an unusual or allergic reaction to sacubitril; valsartan, drugs called angiotensin converting enzyme (ACE) inhibitors, angiotensin II receptor blockers (ARBs), other medicines, foods, dyes, or preservatives -pregnant or trying to get pregnant -breast-feeding How should I use this medicine? Take this medicine by mouth with a glass of water. Follow the directions on the prescription label. You can take it with or without food. If it upsets your stomach, take it with food. Take your medicine at regular intervals. Do not take it more often than directed. Do not stop taking except on your doctor's advice. Do not take this medicine for at least  36 hours before or after you take an ACE inhibitor medicine. Talk to your health care provider if you are not sure if you take an ACE inhibitor. Talk to your pediatrician regarding the use of this medicine in children. Special care may be needed. Overdosage: If you think you have taken too much of this medicine contact a poison control center or emergency room at once. NOTE: This medicine is only for you. Do not share this medicine with others. What if I miss a dose? If you miss a dose, take it as soon as you can. If it is almost time for next dose, take only that dose. Do not take double or extra doses. What may interact with this medicine? Do not take this medicine with any of the following medicines: -aliskiren if you have diabetes -angiotensin-converting enzyme (ACE) inhibitors, like benazepril, captopril, enalapril, fosinopril, lisinopril, or ramipril This medicine may also interact with the following medicines: -angiotensin II receptor blockers (ARBs) like azilsartan, candesartan, eprosartan, irbesartan, losartan, olmesartan, telmisartan, or valsartan -lithium -NSAIDS, medicines for pain and inflammation, like ibuprofen or naproxen -potassium-sparing diuretics like amiloride, spironolactone, and triamterene -potassium supplements This list may not describe all possible interactions. Give your health care provider a list of all the medicines, herbs, non-prescription drugs, or dietary supplements you use. Also tell them if you smoke, drink alcohol, or use illegal drugs. Some items may interact with your medicine. What should I watch for while using this medicine? Tell your doctor or healthcare professional if your symptoms do not start to get better or if they get worse. Do not become pregnant while taking this medicine. Women should inform their  doctor if they wish to become pregnant or think they might be pregnant. There is a potential for serious side effects to an unborn child. Talk to  your health care professional or pharmacist for more information. You may get dizzy. Do not drive, use machinery, or do anything that needs mental alertness until you know how this medicine affects you. Do not stand or sit up quickly, especially if you are an older patient. This reduces the risk of dizzy or fainting spells. Avoid alcoholic drinks; they can make you more dizzy. What side effects may I notice from receiving this medicine? Side effects that you should report to your doctor or health care professional as soon as possible: -allergic reactions like skin rash, itching or hives, swelling of the face, lips, or tongue -signs and symptoms of increased potassium like muscle weakness; chest pain; or fast, irregular heartbeat -signs and symptoms of kidney injury like trouble passing urine or change in the amount of urine -signs and symptoms of low blood pressure like feeling dizzy or lightheaded, or if you develop extreme fatigue Side effects that usually do not require medical attention (report to your doctor or health care professional if they continue or are bothersome): -cough This list may not describe all possible side effects. Call your doctor for medical advice about side effects. You may report side effects to FDA at 1-800-FDA-1088. Where should I keep my medicine? Keep out of the reach of children. Store at room temperature between 15 and 30 degrees C (59 and 86 degrees F). Throw away any unused medicine after the expiration date. NOTE: This sheet is a summary. It may not cover all possible information. If you have questions about this medicine, talk to your doctor, pharmacist, or health care provider.  2018 Elsevier/Gold Standard (2015-09-26 13:54:19)

## 2017-09-01 ENCOUNTER — Other Ambulatory Visit: Payer: Self-pay

## 2017-09-01 DIAGNOSIS — Z79899 Other long term (current) drug therapy: Secondary | ICD-10-CM

## 2017-09-01 LAB — BASIC METABOLIC PANEL
BUN/Creatinine Ratio: 19 (ref 10–24)
BUN: 22 mg/dL (ref 8–27)
CALCIUM: 9.3 mg/dL (ref 8.6–10.2)
CHLORIDE: 105 mmol/L (ref 96–106)
CO2: 22 mmol/L (ref 20–29)
Creatinine, Ser: 1.13 mg/dL (ref 0.76–1.27)
GFR calc Af Amer: 74 mL/min/{1.73_m2} (ref >59)
GFR calc non Af Amer: 64 mL/min/{1.73_m2} (ref >59)
GLUCOSE: 143 mg/dL — AB (ref 65–99)
POTASSIUM: 4.5 mmol/L (ref 3.5–5.2)
Sodium: 144 mmol/L (ref 134–144)

## 2017-09-02 ENCOUNTER — Other Ambulatory Visit: Payer: Self-pay | Admitting: Internal Medicine

## 2017-09-02 ENCOUNTER — Other Ambulatory Visit: Payer: Self-pay | Admitting: Cardiovascular Disease

## 2017-09-03 ENCOUNTER — Telehealth: Payer: Self-pay | Admitting: *Deleted

## 2017-09-03 DIAGNOSIS — E785 Hyperlipidemia, unspecified: Secondary | ICD-10-CM | POA: Diagnosis not present

## 2017-09-03 DIAGNOSIS — M109 Gout, unspecified: Secondary | ICD-10-CM | POA: Diagnosis not present

## 2017-09-03 DIAGNOSIS — E119 Type 2 diabetes mellitus without complications: Secondary | ICD-10-CM | POA: Diagnosis not present

## 2017-09-03 DIAGNOSIS — I10 Essential (primary) hypertension: Secondary | ICD-10-CM | POA: Diagnosis not present

## 2017-09-03 NOTE — Telephone Encounter (Signed)
Pt requiring PA for Entresto 24-26 mg tablet.  PA has been authorized through [employer].  Rep is faxing paper work to initiate PA. Confirmation #[id number]

## 2017-09-10 NOTE — Telephone Encounter (Signed)
I spoke to rep. Concerning PA for Entresto.  PA didn't go through due to pt specific plan. I was given new department to contact to initiate PA. [phone] Order # [id number] Forms will be sent once clinical team reviews request.

## 2017-09-10 NOTE — Telephone Encounter (Signed)
I spoke too Clinical Advisor and answered criteria for appeal. Fax will be sent to verify approval.

## 2017-09-11 NOTE — Telephone Encounter (Signed)
Spoke with the patient and Nicolette Bang pharmacy Theme park manager.) the [employer] 24-26 mg has been approved from Jan. 17, 2019 through Dec. 31, 2019.

## 2017-09-16 ENCOUNTER — Telehealth: Payer: Self-pay | Admitting: Physician Assistant

## 2017-09-16 ENCOUNTER — Other Ambulatory Visit
Admission: RE | Admit: 2017-09-16 | Discharge: 2017-09-16 | Disposition: A | Discharge status: Home / Self Care | Payer: PPO | Source: Ambulatory Visit | Attending: Dermatopathology | Admitting: Dermatopathology

## 2017-09-16 DIAGNOSIS — Z79899 Other long term (current) drug therapy: Secondary | ICD-10-CM

## 2017-09-16 DIAGNOSIS — I5022 Chronic systolic (congestive) heart failure: Secondary | ICD-10-CM

## 2017-09-16 LAB — BASIC METABOLIC PANEL
Anion gap: 9 (ref 5–15)
BUN: 39 mg/dL — AB (ref 6–20)
CHLORIDE: 106 mmol/L (ref 101–111)
CO2: 24 mmol/L (ref 22–32)
CREATININE: 1.39 mg/dL — AB (ref 0.61–1.24)
Calcium: 8.8 mg/dL — ABNORMAL LOW (ref 8.9–10.3)
GFR calc Af Amer: 56 mL/min — ABNORMAL LOW (ref >60)
GFR calc non Af Amer: 48 mL/min — ABNORMAL LOW (ref >60)
GLUCOSE: 140 mg/dL — AB (ref 65–99)
Potassium: 4.8 mmol/L (ref 3.5–5.1)
Sodium: 139 mmol/L (ref 135–145)

## 2017-09-16 NOTE — Telephone Encounter (Signed)
I spoke with the patient- he is aware of his lab results. He will repeat a BMP in 2 week at the [facility].  He states that he did get a notice from his insurance on Monday that the [facility] was not approved.  I advised him that according to our records, his entresto did receive prior approval at the end of last week as documented on 09/11/17 and that [facility] was notified of this. He states he will call him insurance to verify. I advised him he may do this if he wishes, but most likely the letter from his insurance was being generated as we were going through the PA process. He states the pharmacy had priced him $600 for a 90-day supply. I advised we had only sent a 30-day supply to the pharmacy.  He will contact his insurance, and then I have advised him to contact the pharmacy. He does currently have samples that he is using.

## 2017-10-05 ENCOUNTER — Other Ambulatory Visit
Admission: RE | Admit: 2017-10-05 | Discharge: 2017-10-05 | Disposition: A | Discharge status: Home / Self Care | Payer: PPO | Source: Ambulatory Visit | Attending: Physician Assistant | Admitting: Physician Assistant

## 2017-10-05 DIAGNOSIS — I5022 Chronic systolic (congestive) heart failure: Secondary | ICD-10-CM | POA: Diagnosis not present

## 2017-10-05 LAB — BASIC METABOLIC PANEL
ANION GAP: 10 (ref 5–15)
BUN: 21 mg/dL — ABNORMAL HIGH (ref 6–20)
CHLORIDE: 104 mmol/L (ref 101–111)
CO2: 26 mmol/L (ref 22–32)
CREATININE: 1.48 mg/dL — AB (ref 0.61–1.24)
Calcium: 8.9 mg/dL (ref 8.9–10.3)
GFR calc non Af Amer: 45 mL/min — ABNORMAL LOW (ref >60)
GFR, EST AFRICAN AMERICAN: 52 mL/min — AB (ref >60)
Glucose, Bld: 130 mg/dL — ABNORMAL HIGH (ref 65–99)
Potassium: 4.7 mmol/L (ref 3.5–5.1)
SODIUM: 140 mmol/L (ref 135–145)

## 2017-11-10 DIAGNOSIS — H52223 Regular astigmatism, bilateral: Secondary | ICD-10-CM | POA: Diagnosis not present

## 2017-11-10 DIAGNOSIS — H524 Presbyopia: Secondary | ICD-10-CM | POA: Diagnosis not present

## 2017-11-10 DIAGNOSIS — H2513 Age-related nuclear cataract, bilateral: Secondary | ICD-10-CM | POA: Diagnosis not present

## 2017-11-10 DIAGNOSIS — E119 Type 2 diabetes mellitus without complications: Secondary | ICD-10-CM | POA: Diagnosis not present

## 2017-11-10 DIAGNOSIS — H5203 Hypermetropia, bilateral: Secondary | ICD-10-CM | POA: Diagnosis not present

## 2017-12-17 ENCOUNTER — Other Ambulatory Visit: Payer: Self-pay | Admitting: Cardiovascular Disease

## 2017-12-21 ENCOUNTER — Other Ambulatory Visit: Payer: Self-pay | Admitting: Cardiovascular Disease

## 2017-12-23 ENCOUNTER — Telehealth: Payer: Self-pay | Admitting: Cardiovascular Disease

## 2017-12-23 DIAGNOSIS — I10 Essential (primary) hypertension: Secondary | ICD-10-CM | POA: Diagnosis not present

## 2017-12-23 DIAGNOSIS — I5022 Chronic systolic (congestive) heart failure: Secondary | ICD-10-CM | POA: Diagnosis not present

## 2017-12-23 DIAGNOSIS — Z6841 Body Mass Index (BMI) 40.0 and over, adult: Secondary | ICD-10-CM | POA: Diagnosis not present

## 2017-12-23 DIAGNOSIS — E785 Hyperlipidemia, unspecified: Secondary | ICD-10-CM | POA: Diagnosis not present

## 2017-12-23 NOTE — Telephone Encounter (Signed)
Triage nurse unable to speak with patient at this time.  Patient aware.  Please call cell. [phone].

## 2017-12-23 NOTE — Telephone Encounter (Signed)
Patient states that he is not able to afford the [facility] and needs to switch to something else cheaper. Advised that I would check with provider and then would give him a call back. He verbalized understanding with no further questions at this time.

## 2017-12-23 NOTE — Telephone Encounter (Signed)
Please check to see if he would qualify for any patient assistance through the pharmacy or pharmaceutical company.  If so, please have patient complete paperwork.

## 2017-12-23 NOTE — Telephone Encounter (Signed)
Pt c/o medication issue:  1. Name of Medication: Losartan 25 mg po q day   2. How are you currently taking this medication (dosage and times per day)?   3. Are you having a reaction (difficulty breathing--STAT)? no  4. What is your medication issue?    PATIENT IN LOBBY WAITING DEMANDED REPEATEDLY TO SPEAK WITH NURSE  Patient states he was given this med before and it was not affordable .  He cannot get this filled and needs to be on a medication for BP .

## 2017-12-24 NOTE — Telephone Encounter (Signed)
Patient came to office to pick up samples and complete patient portion of entresto application.  Placed in Nurse Box.

## 2017-12-24 NOTE — Telephone Encounter (Signed)
Spoke with patients room mate and she expressed frustration with medications that are ordered and not covered by medicare which cause undue stress on them to try and afford. Reviewed that I have assistance paperwork available for pick up and if approved they will sometimes cover 100% depending on income. She again expressed that healthcare, social security, and all assistance programs are causing frustration when sending in these types of new medications that are not covered. Asked if she would like me to mail her this application and then she expressed that the mail is not the best and they are always not getting their stuff. She then requested for him to come by and if we had some samples available.   Medication Samples have been provided to the patient.  Drug name: Sherryll Burger       Strength: 24/26        Qty: 1 box  LOT: [id number]  Exp.Date: June 2021  Along with Free 30 day coupon and application for assistance.

## 2018-01-04 NOTE — Telephone Encounter (Signed)
Heard back from Joe with CoverMyMeds- he states he spoke with the pharmacy and they were able to process the patient's entresto.  This will be a $45 co-pay. Will forward to Dr. Windell Hummingbird nurse Elita Quick as an Lorain Childes- unsure if the patient will pay this for his medication.

## 2018-01-04 NOTE — Telephone Encounter (Signed)
I went on CoverMyMeds to try to process the patient's PA for his Entresto 24/26 mg tablets. Message received in the PA process on CoverMyMeds stating: "Available without authorization"  I called and spoke with a CoverMyMeds rep and notified them that I had received this message. Per [employer]Meds, they are going to reach out to the patient's pharmacy to verify if the drug can be processed with pursuing anything further.   Per CoverMyMeds rep, he will call me back with an update from the pharmacy.

## 2018-01-04 NOTE — Telephone Encounter (Signed)
Cover my meds calling to check status of PA for entresto please call with update.

## 2018-01-05 NOTE — Telephone Encounter (Signed)
Spoke with patients friend per release form and made her aware of costs and she verbalized understanding with no further questions at this time. She did mention that she would discuss with him and that they are concerned this will push him in the donut hole. Advised they could call back if we can assist any further.

## 2018-01-11 NOTE — Telephone Encounter (Signed)
[employer] patient assistance calling stating they do need a PA on Entresto    Please call back

## 2018-01-12 ENCOUNTER — Encounter: Payer: Self-pay | Admitting: *Deleted

## 2018-01-12 NOTE — Telephone Encounter (Signed)
S/w representative, Jasmine, from [employer]. She said a PA was needed and would need to start by calling [facility]. I said how [email or web address] said no PA was needed. She said documentation that is not needed needs to be faxed to them at [phone]. She suggested I call the pharmacy.  [facility] said that prescription is ready and will have a co-pay of $45. She cannot provide any documentation that PA is not needed.  Called [facility]. Spoke w/ representative. She said Sherryll Burger was approved from 09/10/17 to 08/24/18. She will re-fax a letter. I will go ahead and fax this encounter and telephone encounter from 09/03/17 that has PA approval to [phone].

## 2018-01-14 NOTE — Telephone Encounter (Signed)
Fax received from [employer] stating that a redetermination request had been submitted for approval for entresto 24/26 mg tablets.  "This redetermination request has been: APPROVED"  Entresto 24/26 mg tablets has been approved from 08/25/17-08/24/18.  A copy of this fax was also faxed to [employer] at [phone]. Confirmation received.

## 2018-01-19 MED ORDER — LOSARTAN POTASSIUM 25 MG PO TABS: 25.0000 mg | ORAL_TABLET | Freq: Every day | ORAL | 3 refills | Status: DC

## 2018-01-19 NOTE — Telephone Encounter (Signed)
Patient would like to stop the [facility] and take the losartan. He said if he uses the Sherryll Burger is will put him into the donut hole. Patient verbalized understanding to stop entresto and start losartan 25 mg daily. Rx sent to pharmacy.  Will route to Dr Graciela Husbands to see about Mexiletine.

## 2018-01-19 NOTE — Telephone Encounter (Signed)
S/w [employer] stated that they did receive the fax for approval from [employer].  However, [employer] has not approved patient assistance yet. They are sending request to to insurance for verification from [employer]' end so they can determine what portion of the medication the patient will need to pay with insurance coverage. Then [employer] will determine if patient qualifies for the patient assistance at that point.

## 2018-01-19 NOTE — Telephone Encounter (Signed)
Patient aware he will have to wait in lobby for a response from the nurse.  He wants to know if there a cheaper alternative to the entresto. He also says this medication upsets his stomach and he can olny tolerate taking once daily with breakfast.

## 2018-01-19 NOTE — Telephone Encounter (Signed)
Patient was in the lobby. Spoke to him face to face. He determined he would like to be on a different medication that would be more affordable as well as not upset his stomach. He says he tried taking Entresto twice a day, however, he would get "upset stomach." He's been taking once a day in the morning for a while. He would like to know what alternative there is to [place]. Advised I will route to provider for advice.

## 2018-01-19 NOTE — Addendum Note (Signed)
Addended by: Stann Mainland on: 01/19/2018 02:06 PM   Modules accepted: Orders

## 2018-01-19 NOTE — Telephone Encounter (Signed)
Some of his stomach upset may be from mexiletine. I will defer management of this to EP. If he wants, we can change him from [facility] to losartan 25 mg daily. However, Sherryll Burger is a better medication. He will need to notify us if his BP runs high with this change for titration of medication.

## 2018-01-19 NOTE — Telephone Encounter (Signed)
Patient came to office to check status of approval.  He was told at pharmacy 30 day voucher could not be used more than once and is unsure what to do about refill.

## 2018-01-27 ENCOUNTER — Telehealth: Payer: Self-pay | Admitting: Physician Assistant

## 2018-01-27 NOTE — Telephone Encounter (Signed)
Fax received from "[facility]" stating that they had been informed that the prior authorization for the patient had been approved by their insurance on 01/05/18.   "Your patient requested to be referred to the [employer] Patient [employer] for financial assistance for [employer]. The [employer] Patient Assistance [employer] will contact your patient regarding enrollment and eligibility."  Any questions can be directed to [facility] Patient Support Program at [phone].

## 2018-02-15 ENCOUNTER — Telehealth: Payer: Self-pay | Admitting: Cardiovascular Disease

## 2018-02-15 NOTE — Telephone Encounter (Signed)
Lm for patient to call back and confirm or reschedule new time off appointment this week

## 2018-02-15 NOTE — Telephone Encounter (Signed)
-----   Message from Sondra Barges, [place] sent at 02/12/2018  1:04 PM EDT ----- I have this patient on my schedule for 1 PM on 7/2. We have a provider meeting with Harriett Sine until 1:30 that day. Can you move him to another date/time?

## 2018-02-15 NOTE — Telephone Encounter (Signed)
Patient confirmed new time of 6/27 at 2pm will work

## 2018-02-17 NOTE — Progress Notes (Signed)
Cardiology Office Note Date:  02/18/2018  Patient ID:  [name] [name], [name] [name] 1941/04/29, MRN [id number] PCP:  [name], MD  Cardiologist:  Dr. Kirke Corin, MD Electrophysiologist: Dr. Graciela Husbands, MD     Chief Complaint: Follow up  History of Present Illness: [name] [name] is a 77 y.o. male with history of CAD s/p 5 vessel CABG in 05/2014 in the setting of NSTEMI with acute systolic CHF mixed ICM and NICM, mild AAA noted to be stable in 04/2017, mild to moderate mitral regurgitation, ACEi-induced hyperkalemia, frequent PVCs, DM2 with diabetic neuropathy, HTN, HLD, and obesity who presents for routine follow up of his ICM and frequent PVCs.  Most recent cardiac cath in 04/2015 showed 3 of 5 patent grafts with new occlusions of the vein grafts to the OM1 and OM2. The native marginals were small and not felt to be amenable to PCI. Medical therapy was advised. He had worsening LV systolic function to 30-35% in 04/2015 due to very frequent PVCs with Holter monitoring in 06/2015 demonstrating 28,000 PVCs and was placed on mexiletine therapy at the recommendation of EP. Follow up Holter monitor in 07/2015 showed significant improvement in PVC burden down to 1724. Follow-up echocardiography in 07/2015 showed modestly improved LV function, with an EF of 35-40%. Unfortunately, his mexiletine was stopped due to poor tolerance and his Coreg dose was increased. Though he did self restart the mexiletine on his own shortly after it was discontinued and was tolerated without issues. He underwent repeat Holter monitoring in 10/2015 with a decreased dose of mexiletine that showed 7001 PVCs. He was seen by Dr. Kirke Corin on 05/30/16 and doing well at that time. I saw him on 7/2/018, and he was doing well. Repeat 24-hour Holter monitoring at that time showed 31,000 PVCs. He was referred to Dr. Graciela Husbands given his PVC burden and underwent repeat echo on 04/03/2017 that showed stable EF of 35-40%, moderate biatrial enlargement, and  frequent PVCs noted during the exam. His mexilitine was doubled per EP. Follow up RN visit for rhythm strip on 05/05/2017 showed NSR without PVCs. Repeat echo on 07/07/2017 showed stable EF of 35-40%, Gr2DD, mildly dilated left atrium, moderately dilated RV with moderately reduced RV systolic function, moderately dilated right atrium. In follow up with EP on 07/09/2017, without palpable PVC by EP. Repeat 24-hour Holter on 07/27/2017 showed 9,240 PVCs (9%) which was improved from prior. EP has felt the patient's cardiomyopathy is ischemic rather than PVC burden.  He was seen and follow-up in 08/2017 and was doing well.  He again noted nausea with mexiletine, leading to the decrease in his dose from 150 mg 4 times daily to 150 mg 3 times daily.  His losartan was stopped and he was placed on Entresto.  He was not weighing himself and he was advised to pick up a scale to weigh daily.  Patient contacted our office in 12/2016 requesting to stop Entresto and take losartan secondary to finances.  We have asked EP to review his mexiletine dose and are awaiting their input.  Patient comes in doing well today.  He is not taking Entresto or losartan.  Refuses to take any further losartan due to recalls.  Not checking blood pressure routinely though states it has "been good."  Not weighing himself routinely.  Has not noticed any orthopnea, lower extremity swelling, abdominal distention, cough, or early satiety.  Reports he has been trying to lose weight.  Denies eating foods high in salt.  Drinks approximately 5 "big glasses"  of water daily along with a glass of tea and sometimes a small glass of Coke at bedtime on a daily basis.  No chest pain, shortness of breath, palpitations, dizziness, presyncope, or syncope.  Reports he is in a hurry today as he has to go get a haircut.   Past Medical History:  Diagnosis Date  . AAA (abdominal aortic aneurysm) (HCC)   . ACE inhibitor associated hyperkalemia 09/20/2015  . Arthritis   .  CAD (coronary artery disease)    a. 03/2014 NSTEMI--> 05/2014 CABG x 5 (LIMA->D1, VG->LAD, VG->OM1, VG->OM2, VG->RPDA);  c. 04/2015 Cath: LM nl, LAD 100ost, 40d, RI 80, LCX 80p/m, OM1 70, OM2 80, RCA 20p, 4m, 90d, VG->dLAD min irregs, VG->OM1 100, VG->OM2 100, VG->RPDA nl, LIMA->D2 nl.  . Chronic systolic CHF (congestive heart failure) (HCC)    a. EF 25-30% by 2D ECHO 04/20/14. Severely dec global hypokinesis. mildly elevated RVSP;  b. 12/2014 Echo: EF 40-45%, mod dil LA, mildly dil RV/RA;  c. 04/2015 Echo: EF 30-35%, sev antsept HK, Gr1 DD, mildly dil RA/LA;  d. 07/2015 Echo: EF 35-40%, ant/antsept HK, Gr1 DD, mildly red RV fxn.  . Diabetes mellitus without complication (HCC)   . Epigastric hernia   . Frequent PVCs    a. 24 hr Holter 06/2015: >28K PVCs accounting for 25% of all beats, rare PAC-->mexilitene started;  b. 07/2015 Holter: 1700 PVC's/24 hrs.  [name] GERD (gastroesophageal reflux disease)   . Gout   . Hyperlipidemia   . Hypertension   . Ischemic cardiomyopathy    a. EF 25-30% 03/2014;  b. EF 40-45% 12/2014.  . Mitral regurgitation    a. 03/2014 TEE: mild to mod MR.  . Nephrolithiasis   . Neuromuscular disorder (HCC)    DIABETIC NEUROPATHY  . Obesity   . Umbilical hernia     Past Surgical History:  Procedure Laterality Date  . CARDIAC CATHETERIZATION     [facility]  . CARDIAC CATHETERIZATION N/A 05/23/2015   Procedure: Left Heart Cath and Coronary Angiography;  Surgeon: Iran Ouch, MD;  Location: [facility] INVASIVE CV LAB;  Service: Cardiovascular;  Laterality: N/A;  . CARPAL TUNNEL RELEASE Right   . CORONARY ARTERY BYPASS GRAFT N/A 06/06/2014   Procedure: CORONARY ARTERY BYPASS GRAFTING (CABG) x 5 USING LEFT AND RIGHT SAPHENOUS LEG VEIN HARVESTED ENDOSCOPICALLY;  Surgeon: Kerin Perna, MD;  Location: [facility] OR;  Service: Open Heart Surgery;  Laterality: N/A;  . ESOPHAGOGASTRODUODENOSCOPY (EGD) WITH PROPOFOL N/A 02/11/2016   Procedure: ESOPHAGOGASTRODUODENOSCOPY (EGD) WITH PROPOFOL with  dialation;  Surgeon: [name], MD;  Location: [facility];  Service: Endoscopy;  Laterality: N/A;  diabetic, oral meds  . INTRAOPERATIVE TRANSESOPHAGEAL ECHOCARDIOGRAM N/A 06/06/2014   Procedure: INTRAOPERATIVE TRANSESOPHAGEAL ECHOCARDIOGRAM;  Surgeon: Kerin Perna, MD;  Location: [facility] OR;  Service: Open Heart Surgery;  Laterality: N/A;  . ROTATOR CUFF REPAIR Right 1996,1997,2000  . TEE WITHOUT CARDIOVERSION N/A 04/24/2014   Procedure: TRANSESOPHAGEAL ECHOCARDIOGRAM (TEE);  Surgeon: [name], MD;  Location: [facility] ENDOSCOPY;  Service: Cardiovascular;  Laterality: N/A;    Current Meds  Medication Sig  . aspirin EC 81 MG tablet Take 1 tablet (81 mg total) by mouth daily.  [name] atorvastatin (LIPITOR) 40 MG tablet TAKE ONE TABLET BY MOUTH ONCE DAILY.  . carvedilol (COREG) 12.5 MG tablet Take 1 tablet (12.5 mg total) by mouth 2 (two) times daily.  . colchicine 0.6 MG tablet Take 0.6 mg by mouth daily as needed (for gout flareup).   . diphenhydramine-acetaminophen (TYLENOL  PM) 25-500 MG TABS tablet Take 1 tablet by mouth at bedtime as needed.  . furosemide (LASIX) 40 MG tablet TAKE 1 TABLET BY MOUTH ONCE DAILY  . gabapentin (NEURONTIN) 300 MG capsule Take 300 mg by mouth 4 (four) times daily.   [name] mexiletine (MEXITIL) 150 MG capsule Take 1 capsule (150 mg total) by mouth 3 (three) times daily.  . nitroGLYCERIN (NITROSTAT) 0.4 MG SL tablet Place 1 tablet (0.4 mg total) under the tongue every 5 (five) minutes as needed for chest pain.    Allergies:   Glimepiride   Social History:  The patient  reports that he has quit smoking. His smoking use included cigarettes. He has a 25.00 pack-year smoking history. He has quit using smokeless tobacco. His smokeless tobacco use included chew. He reports that he does not drink alcohol or use drugs.   Family History:  The patient's family history includes Colon polyps in his brother; Lung cancer in his brother; Stroke in his father; Throat cancer in his  father.  ROS:   Review of Systems  Constitutional: Positive for malaise/fatigue. Negative for chills, diaphoresis, fever and weight loss.  HENT: Negative for congestion.   Eyes: Negative for discharge and redness.  Respiratory: Negative for cough, hemoptysis, sputum production, shortness of breath and wheezing.   Cardiovascular: Negative for chest pain, palpitations, orthopnea, claudication, leg swelling and PND.  Gastrointestinal: Negative for abdominal pain, blood in stool, heartburn, melena, nausea and vomiting.  Genitourinary: Negative for hematuria.  Musculoskeletal: Negative for falls and myalgias.  Skin: Negative for rash.  Neurological: Negative for dizziness, tingling, tremors, sensory change, speech change, focal weakness, loss of consciousness and weakness.  Endo/Heme/Allergies: Does not bruise/bleed easily.  Psychiatric/Behavioral: Negative for substance abuse. The patient is not nervous/anxious.   All other systems reviewed and are negative.    PHYSICAL EXAM:  VS:  BP 132/80 (BP Location: Left Arm, Patient Position: Sitting, Cuff Size: Normal)   Pulse 67   Ht 5\' 10"  (1.778 m)   Wt 275 lb 12 oz (125.1 kg)   BMI 39.57 kg/m  BMI: Body mass index is 39.57 kg/m.  Physical Exam  Constitutional: He is oriented to person, place, and time. He appears well-developed and well-nourished.  HENT:  Head: Normocephalic and atraumatic.  Eyes: Right eye exhibits no discharge. Left eye exhibits no discharge.  Neck: Normal range of motion. No JVD present.  JVD difficult to assess secondary to body habitus  Cardiovascular: Normal rate, regular rhythm, S1 normal and S2 normal. Exam reveals no distant heart sounds, no friction rub, no midsystolic click and no opening snap.  Murmur heard. High-pitched blowing holosystolic murmur is present with a grade of 2/6 at the apex. Pulses:      Posterior tibial pulses are 2+ on the right side, and 2+ on the left side.  Pulmonary/Chest: Effort  normal and breath sounds normal. No respiratory distress. He has no decreased breath sounds. He has no wheezes. He has no rales. He exhibits no tenderness.  Abdominal: Soft. He exhibits no distension. There is no tenderness.  Musculoskeletal: He exhibits no edema.  Neurological: He is alert and oriented to person, place, and time.  Skin: Skin is warm and dry. No cyanosis. Nails show no clubbing.  Psychiatric: He has a normal mood and affect. His speech is normal and behavior is normal. Judgment and thought content normal.     EKG:  Was ordered and interpreted by me today. Shows NSR, 67 bpm, frequent PVCs, possible prior inferior  infarct, nonspecific ST-T changes  Recent Labs: 02/23/2017: Hemoglobin 16.4; Magnesium 2.2; Platelets 175; TSH 5.150 10/05/2017: BUN 21; Creatinine, Ser 1.48; Potassium 4.7; Sodium 140  No results found for requested labs within last 8760 hours.   CrCl cannot be calculated (Patient's most recent lab result is older than the maximum 21 days allowed.).   Wt Readings from Last 3 Encounters:  02/18/18 275 lb 12 oz (125.1 kg)  08/31/17 260 lb 12 oz (118.3 kg)  07/09/17 276 lb (125.2 kg)     Other studies reviewed: Additional studies/records reviewed today include: summarized above  ASSESSMENT AND PLAN:  1. 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  However, his weight is up 15 pounds from his office visit in 08/2017.  He denies excess sodium intake though has been drinking what sounds to be like greater than 64 ounces of fluids in a day.  He is not interested in checking any labs today as he has to go get a haircut.  On exam, he does not appear grossly volume overloaded though it would have been helpful to draw labs such as a BNP, CBC, and BMP.  Has a cardiomyopathy is felt to be multifactorial including PVC burden as well as ischemic.  He does not have any symptoms  concerning for angina at this time.  He remains on mexiletine per EP for his PVCs as well as carvedilol.  He refuses to take losartan as well as Entresto.  He has agreed to start valsartan 40 mg daily.  He is not on an ACE inhibitor secondary to ACE inhibitor induced hyperkalemia.  He has not been maintained on spironolactone secondary to his history of hyperkalemia as well.  If blood pressure/heart rate allow and follow-up consider escalation of carvedilol to 25 mg twice daily.  2. Frequent PVCs: Asymptomatic.  PVC burden was improved on mexiletine 150 mg 4 tabs daily however, patient preferred to take this as 3 tabs daily secondary to GI upset.  At this current dose he is able to tolerate mexiletine though does appear to have increased PVCs on twelve-lead EKG today.  Continue carvedilol as above.  Recommend he follow-up with EP.  3. CAD: No symptoms concerning for angina at this time.  Continue current medications including aspirin, Lipitor, Coreg, valsartan, and as needed sublingual nitroglycerin.  No plans for ischemic evaluation at this time.  4. HTN: Blood pressure reasonably controlled.  Continue current medications as above.  5. AAA: Stable and mild noted on ultrasound in 04/2017. Optimal BP/HR/lipid control. Followed by PCP.  6. HLD: LDL of 44 from 12/2016. On Lipitor 40 mg daily.   Disposition: F/u with Dr. Kirke Corin in 6 months.  Current medicines are reviewed at length with the patient today.  The patient did not have any concerns regarding medicines.  Signed, Eula Listen, PA-C 02/18/2018 2:13 PM     [facility] [address] [place], [place] [address] [phone]

## 2018-02-18 ENCOUNTER — Ambulatory Visit: Payer: PPO | Admitting: Physician Assistant

## 2018-02-18 ENCOUNTER — Encounter: Payer: Self-pay | Admitting: Physician Assistant

## 2018-02-18 VITALS — BP 132/80 | HR 67 | Ht 70.0 in | Wt 275.8 lb

## 2018-02-18 DIAGNOSIS — I5022 Chronic systolic (congestive) heart failure: Secondary | ICD-10-CM | POA: Diagnosis not present

## 2018-02-18 DIAGNOSIS — E785 Hyperlipidemia, unspecified: Secondary | ICD-10-CM

## 2018-02-18 DIAGNOSIS — I10 Essential (primary) hypertension: Secondary | ICD-10-CM | POA: Diagnosis not present

## 2018-02-18 DIAGNOSIS — I428 Other cardiomyopathies: Secondary | ICD-10-CM | POA: Diagnosis not present

## 2018-02-18 DIAGNOSIS — I255 Ischemic cardiomyopathy: Secondary | ICD-10-CM | POA: Diagnosis not present

## 2018-02-18 DIAGNOSIS — I2510 Atherosclerotic heart disease of native coronary artery without angina pectoris: Secondary | ICD-10-CM

## 2018-02-18 DIAGNOSIS — I493 Ventricular premature depolarization: Secondary | ICD-10-CM

## 2018-02-18 MED ORDER — VALSARTAN 40 MG PO TABS: 40.0000 mg | ORAL_TABLET | Freq: Every day | ORAL | 6 refills | Status: DC

## 2018-02-18 NOTE — Patient Instructions (Signed)
Medication Instructions: - Your physician has recommended you make the following change in your medication:   1) START diovan (valsartan) 40 mg- take 1 tablet by mouth once daily  Labwork: - none ordered  Procedures/Testing: - none ordered  Follow-Up: - Your physician wants you to follow-up in: 6 months with Dr. Kirke Corin. You will receive a reminder letter in the mail two months in advance. If you don't receive a letter, please call our office to schedule the follow-up appointment.   Any Additional Special Instructions Will Be Listed Below (If Applicable).     If you need a refill on your cardiac medications before your next appointment, please call your pharmacy.

## 2018-02-23 ENCOUNTER — Ambulatory Visit: Payer: PPO | Admitting: Physician Assistant

## 2018-02-23 DIAGNOSIS — E785 Hyperlipidemia, unspecified: Secondary | ICD-10-CM | POA: Diagnosis not present

## 2018-02-23 DIAGNOSIS — I10 Essential (primary) hypertension: Secondary | ICD-10-CM | POA: Diagnosis not present

## 2018-02-23 DIAGNOSIS — E119 Type 2 diabetes mellitus without complications: Secondary | ICD-10-CM | POA: Diagnosis not present

## 2018-03-02 DIAGNOSIS — E785 Hyperlipidemia, unspecified: Secondary | ICD-10-CM | POA: Diagnosis not present

## 2018-03-02 DIAGNOSIS — Z125 Encounter for screening for malignant neoplasm of prostate: Secondary | ICD-10-CM | POA: Diagnosis not present

## 2018-03-02 DIAGNOSIS — Z0000 Encounter for general adult medical examination without abnormal findings: Secondary | ICD-10-CM | POA: Diagnosis not present

## 2018-03-02 DIAGNOSIS — E1129 Type 2 diabetes mellitus with other diabetic kidney complication: Secondary | ICD-10-CM | POA: Diagnosis not present

## 2018-03-02 DIAGNOSIS — I10 Essential (primary) hypertension: Secondary | ICD-10-CM | POA: Diagnosis not present

## 2018-03-02 DIAGNOSIS — M109 Gout, unspecified: Secondary | ICD-10-CM | POA: Diagnosis not present

## 2018-03-16 ENCOUNTER — Other Ambulatory Visit: Payer: Self-pay | Admitting: Physician Assistant

## 2018-04-07 DIAGNOSIS — I499 Cardiac arrhythmia, unspecified: Secondary | ICD-10-CM | POA: Diagnosis not present

## 2018-04-07 DIAGNOSIS — R972 Elevated prostate specific antigen [PSA]: Secondary | ICD-10-CM | POA: Diagnosis not present

## 2018-04-21 ENCOUNTER — Telehealth: Payer: Self-pay | Admitting: Cardiovascular Disease

## 2018-04-21 ENCOUNTER — Encounter: Payer: Self-pay | Admitting: Urology

## 2018-04-21 ENCOUNTER — Ambulatory Visit: Payer: PPO | Admitting: Urology

## 2018-04-21 VITALS — BP 76/35 | HR 50 | Ht 70.0 in | Wt 276.0 lb

## 2018-04-21 DIAGNOSIS — R972 Elevated prostate specific antigen [PSA]: Secondary | ICD-10-CM | POA: Diagnosis not present

## 2018-04-21 LAB — MICROSCOPIC EXAMINATION
Epithelial Cells (non renal): NONE SEEN /hpf (ref 0–10)
RBC MICROSCOPIC, UA: NONE SEEN /HPF (ref 0–2)
WBC UA: NONE SEEN /HPF (ref 0–5)

## 2018-04-21 LAB — URINALYSIS, COMPLETE
Bilirubin, UA: NEGATIVE
Glucose, UA: NEGATIVE
KETONES UA: NEGATIVE
Leukocytes, UA: NEGATIVE
NITRITE UA: NEGATIVE
Protein, UA: NEGATIVE
RBC UA: NEGATIVE
SPEC GRAV UA: 1.015 (ref 1.005–1.030)
UUROB: 0.2 mg/dL (ref 0.2–1.0)
pH, UA: 5 (ref 5.0–7.5)

## 2018-04-21 LAB — BLADDER SCAN AMB NON-IMAGING: SCAN RESULT: 88

## 2018-04-21 NOTE — Telephone Encounter (Signed)
STAT if HR is under 50 or over 120 (normal HR is 60-100 beats per minute)  1) What is your heart rate? 50 in right finger and 35 in left finger - taken at another appointment today  2) Do you have a log of your heart rate readings (document readings)? no  3) Do you have any other symptoms? no   Please call to discuss

## 2018-04-21 NOTE — Progress Notes (Signed)
   04/21/2018 3:37 PM   [name] [name] [birth year] [id number]  Reason for visit: Follow up elevated PSA, 9.4  HPI: I saw [name] [name] in urology clinic to discuss elevated PSA of 9.4.  He is a very comorbid 77 year old male with past medical history notable for obesity, diabetes, ischemic cardiomyopathy, coronary artery disease status post 5 vessel CABG, and AAA.  He has had a steady rise in his PSA from 3.3 in 2016, and 7.5 in 2017.  He ambulates with a cane and does get short of breath if he walks further than 1-2 blocks.  He reports he does have some urinary urgency and frequency, however he is on diuretics.  Denies weak stream.  Denies family history of prostate cancer.  He was seen by Dr. Marlou Porch in 2017 for elevated PSA to 7.5, and they jointly agreed to forego biopsy secondary to his comorbidities.   ROS: Please see flowsheet from today's date for complete review of systems.  Physical Exam: BP (!) 76/35 (BP Location: Left Arm, Patient Position: Sitting, Cuff Size: Normal)   Pulse (!) 50   Ht 5\' 10"  (1.778 m)   Wt 276 lb (125.2 kg)   BMI 39.60 kg/m    Constitutional:  Alert and oriented, No acute distress. Respiratory: Normal respiratory effort, no increased work of breathing. GI: Abdomen is soft, nontender, nondistended, no abdominal masses DRE: 40g smooth, no nodules, irregularity or masses Skin: No rashes, bruises or suspicious lesions. Neurologic: Grossly intact, no focal deficits, moving all 4 extremities. Psychiatric: Normal mood and affect  Laboratory Data: PSA 02/2018: 9.39 04/2016: 7.5 03/2016: 6.63 10/2014 3.35  Assessment & Plan:   In summary, [name] [name] is a 77 year old extremely co-morbid male with steadily rising PSA, now 9.4.  We reviewed the implications of an elevated PSA and the uncertainty surrounding it. In general, a man's PSA increases with age and is produced by both normal and cancerous prostate tissue. The differential diagnosis for elevated  PSA includes BPH, prostate cancer, infection, recent intercourse/ejaculation, recent urethroscopic manipulation (foley placement/cystoscopy) or trauma, and prostatitis.   Management of an elevated PSA can include observation or prostate biopsy and we discussed this in detail. Our goal is to detect clinically significant prostate cancers, and manage with either active surveillance, surgery, or radiation for localized disease. Risks of prostate biopsy include bleeding, infection (including life threatening sepsis), pain, and lower urinary symptoms. Hematuria, hematospermia, and blood in the stool are all common after biopsy and can persist up to 4 weeks.   I offered him prostate biopsy, or stopping PSA screening moving forward.  He would like to stop PSA screening as he recognizes that he has numerous comorbidities and his projected life expectancy is likely less than 5 to 10 years.  I think this is a very reasonable decision to forego prostate biopsy.  We did discuss there is a small risk of missing a clinically significant cancer that could potentially metastasize and cause symptoms and or death.  He is willing to accept this risk.   Stop PSA screening, follow up as needed  Sondra Come, MD  [facility] [address] [place], [place] [address] [phone]

## 2018-04-21 NOTE — Telephone Encounter (Signed)
I attempted to call the patient. No answer/ voice mail not set up yet.   I had received a skype message from the front desk at 3:24 pm that the patient had walked in after being told by another MD office that his HR was low. He was asking to have this checked.  I reviewed the patient's chart while he was here. He is followed by Dr. Kirke Corin & Dr. Graciela Husbands. He has a history of frequent PVC's. I advised the front desk I would add him on for a nurse visit, but would need to perform an EKG to clarify if he was truly bradycardic or having PVC's. Per the front desk, the patient refused an EKG and asked for a call back.   Will attempt to call the patient back.

## 2018-04-28 NOTE — Telephone Encounter (Signed)
Call placed to the patient. There was no answer and no voicemail available.  

## 2018-04-29 NOTE — Telephone Encounter (Signed)
Third attempt. No answer and line was disconnected.

## 2018-05-31 IMAGING — US US RETROPERITONEAL COMPLETE
1 series · 14 of 25 positions shown · non-contrast
Comparison: [DATE]

CLINICAL DATA: Follow-up abdominal aortic aneurysm.

EXAM:
ULTRASOUND RETROPERITONEAL COMPLETE
TECHNIQUE: Ultrasound examination of the abdominal aorta was performed to
evaluate for abdominal aortic aneurysm. The common iliac arteries,
IVC, and kidneys were also evaluated.

[Series 1: us retroperitoneal complete · 0.36mm/px · 14 of 54 slices shown]
[im 1/54]
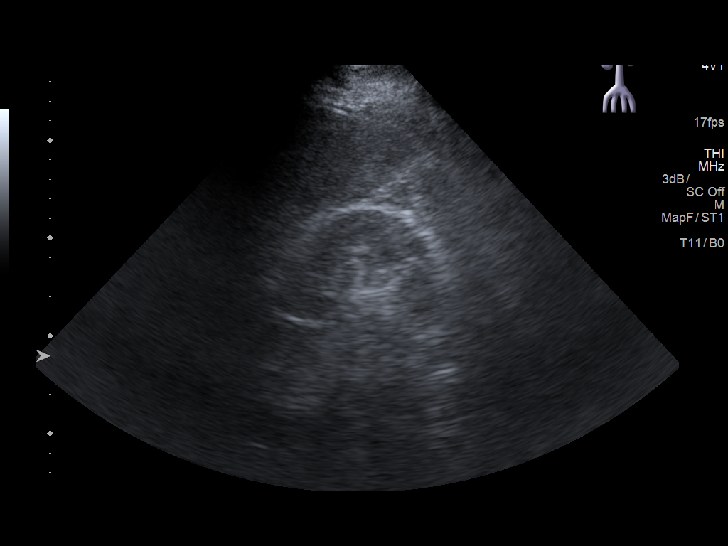
[im 5/54]
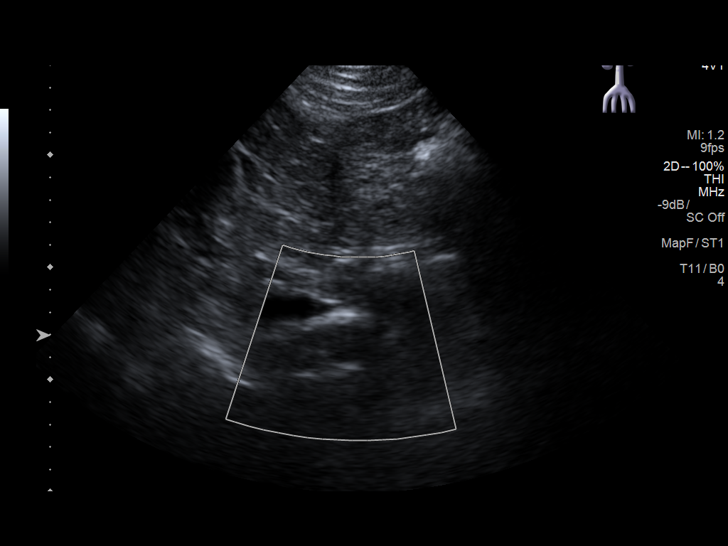
[im 9/54]
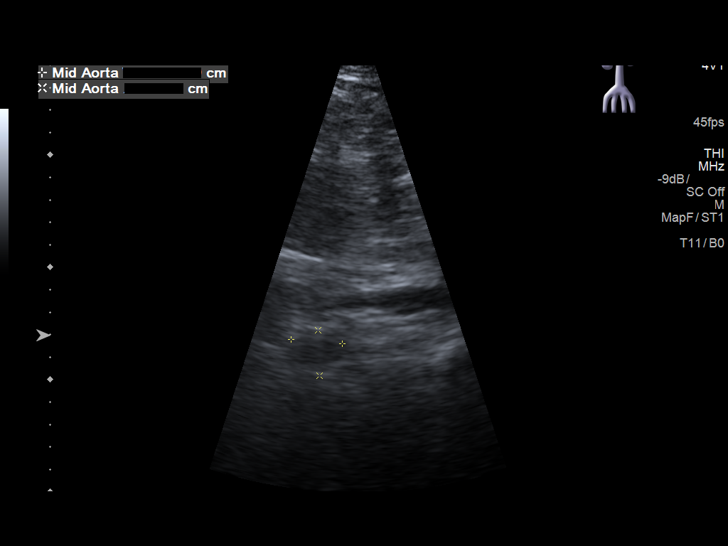
[im 14/54]
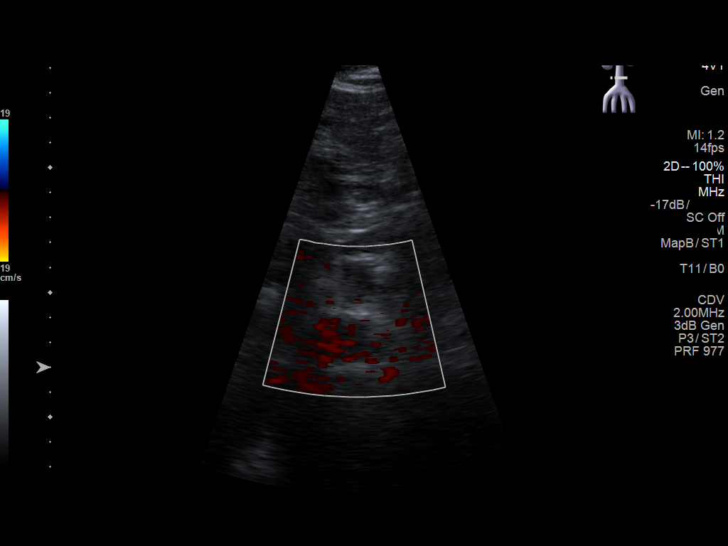
[im 18/54]
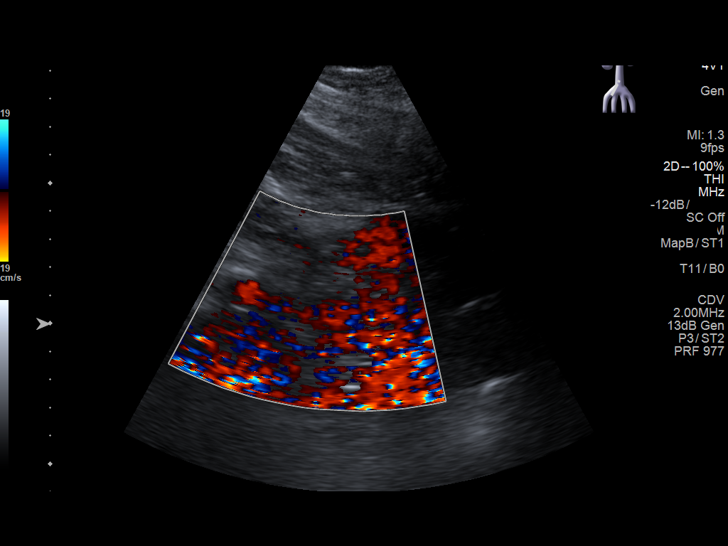
[im 20/54]
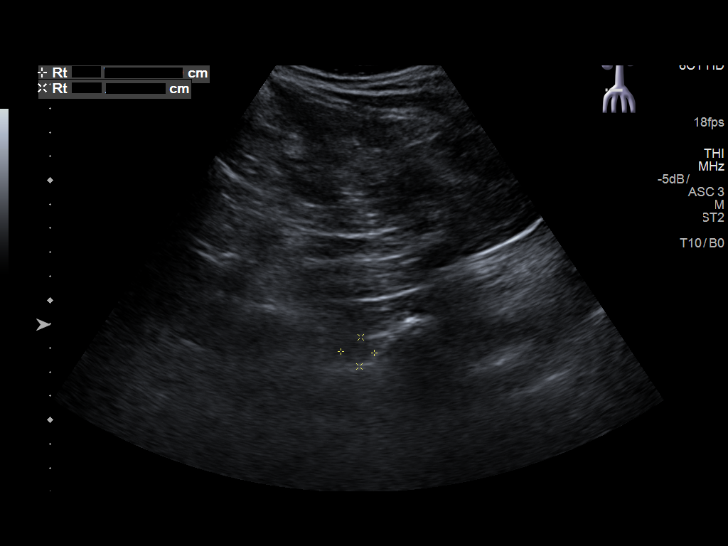
[im 25/54]
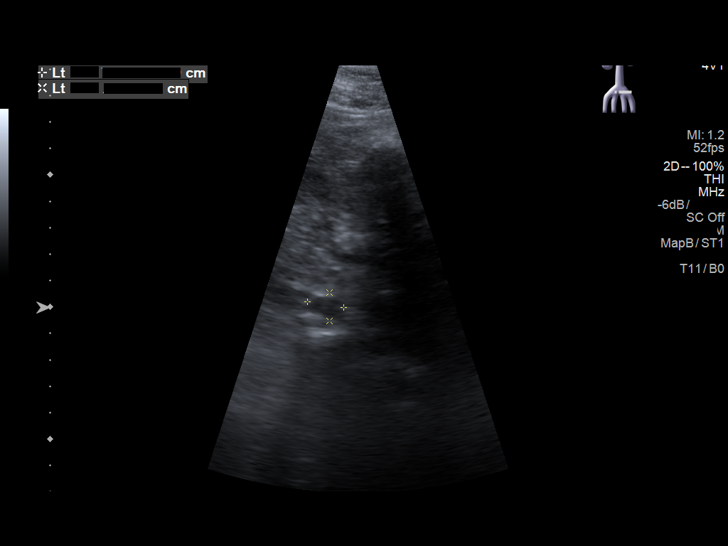
[im 29/54]
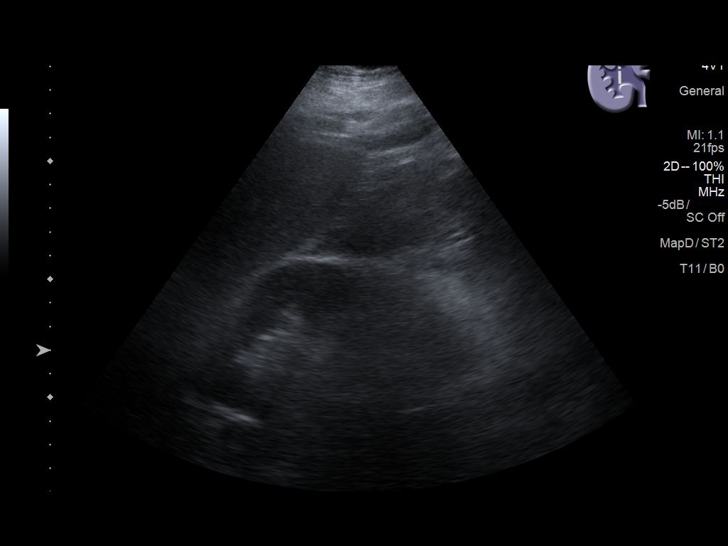
[im 34/54]
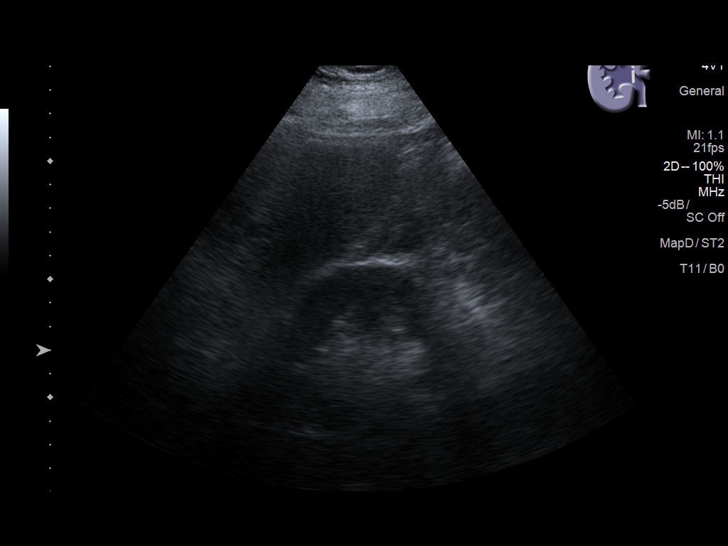
[im 36/54]
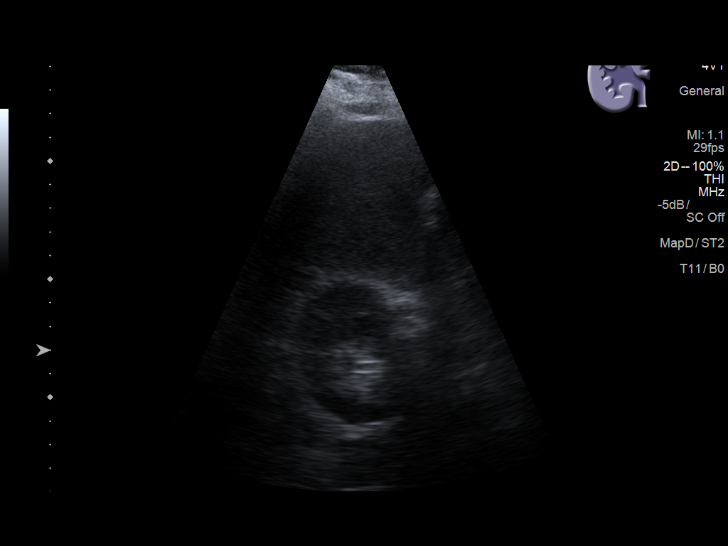
[im 40/54]
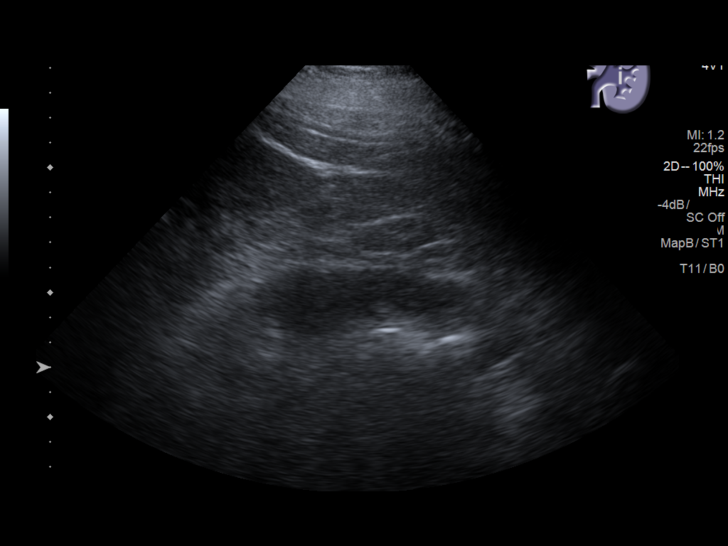
[im 45/54]
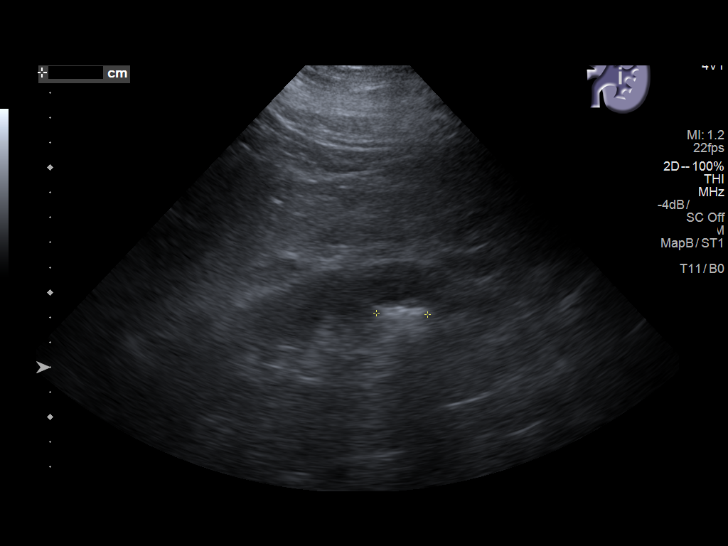
[im 49/54]
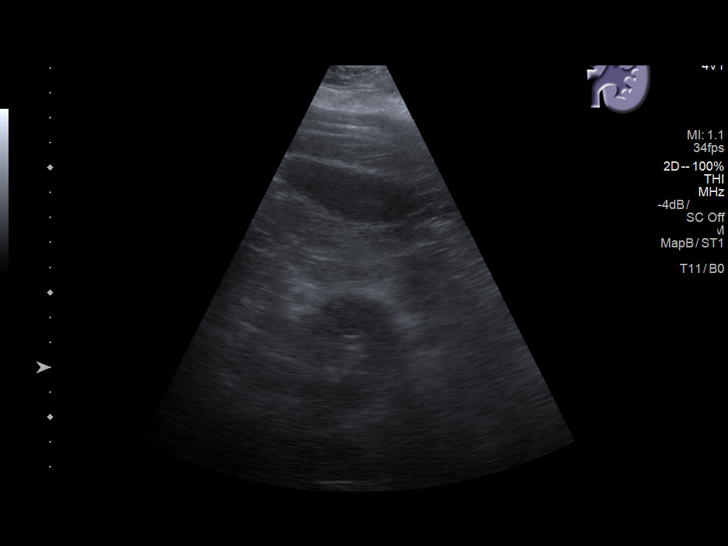
[im 54/54]
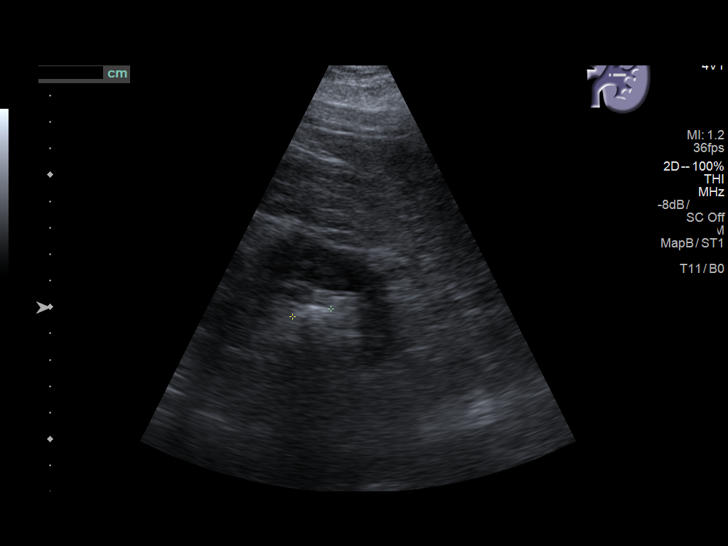

[14 of 25 positions shown; findings below may reference images not displayed]

FINDINGS: Abdominal Aorta

Mild aneurysmal dilatation of the distal abdominal aorta.

Maximum AP

Diameter:  2.6 cm today versus 2.7 cm previously

Maximum TRV

Diameter: 2.8 cm today versus 2.7 cm previously

Right Common Iliac Artery

1.2 cm

Left Common Iliac Artery

1.3 cm

IVC

No abnormality visualized.

Right Kidney

Length: 12.4 cm Echogenicity within normal limits. No mass or
hydronephrosis visualized.

Left Kidney

Length: 13.2 cm  probable nonobstructing calculus in the lower pole.
IMPRESSION: 1. Mild aneurysmal dilatation of the distal abdominal aorta is not
significantly changed.

## 2018-06-04 DIAGNOSIS — G8929 Other chronic pain: Secondary | ICD-10-CM | POA: Diagnosis not present

## 2018-06-04 DIAGNOSIS — M545 Low back pain: Secondary | ICD-10-CM | POA: Diagnosis not present

## 2018-06-10 DIAGNOSIS — G8929 Other chronic pain: Secondary | ICD-10-CM | POA: Diagnosis not present

## 2018-06-10 DIAGNOSIS — M545 Low back pain: Secondary | ICD-10-CM | POA: Diagnosis not present

## 2018-06-30 DIAGNOSIS — R972 Elevated prostate specific antigen [PSA]: Secondary | ICD-10-CM | POA: Diagnosis not present

## 2018-06-30 DIAGNOSIS — E1159 Type 2 diabetes mellitus with other circulatory complications: Secondary | ICD-10-CM | POA: Diagnosis not present

## 2018-06-30 DIAGNOSIS — M545 Low back pain: Secondary | ICD-10-CM | POA: Diagnosis not present

## 2018-06-30 DIAGNOSIS — Z23 Encounter for immunization: Secondary | ICD-10-CM | POA: Diagnosis not present

## 2018-06-30 DIAGNOSIS — M1990 Unspecified osteoarthritis, unspecified site: Secondary | ICD-10-CM | POA: Diagnosis not present

## 2018-06-30 DIAGNOSIS — I10 Essential (primary) hypertension: Secondary | ICD-10-CM | POA: Diagnosis not present

## 2018-08-11 ENCOUNTER — Other Ambulatory Visit: Payer: Self-pay | Admitting: Internal Medicine

## 2018-08-11 ENCOUNTER — Other Ambulatory Visit: Payer: Self-pay | Admitting: Physician Assistant

## 2018-08-26 DIAGNOSIS — R972 Elevated prostate specific antigen [PSA]: Secondary | ICD-10-CM | POA: Diagnosis not present

## 2018-08-26 DIAGNOSIS — E1129 Type 2 diabetes mellitus with other diabetic kidney complication: Secondary | ICD-10-CM | POA: Diagnosis not present

## 2018-09-02 DIAGNOSIS — E119 Type 2 diabetes mellitus without complications: Secondary | ICD-10-CM | POA: Diagnosis not present

## 2018-09-02 DIAGNOSIS — I10 Essential (primary) hypertension: Secondary | ICD-10-CM | POA: Diagnosis not present

## 2018-09-02 DIAGNOSIS — N183 Chronic kidney disease, stage 3 (moderate): Secondary | ICD-10-CM | POA: Diagnosis not present

## 2018-09-02 DIAGNOSIS — I714 Abdominal aortic aneurysm, without rupture: Secondary | ICD-10-CM | POA: Diagnosis not present

## 2018-09-02 DIAGNOSIS — R972 Elevated prostate specific antigen [PSA]: Secondary | ICD-10-CM | POA: Diagnosis not present

## 2018-09-02 DIAGNOSIS — E785 Hyperlipidemia, unspecified: Secondary | ICD-10-CM | POA: Diagnosis not present

## 2018-09-13 ENCOUNTER — Other Ambulatory Visit: Payer: Self-pay | Admitting: Physician Assistant

## 2018-09-13 ENCOUNTER — Other Ambulatory Visit: Payer: Self-pay | Admitting: Cardiovascular Disease

## 2018-10-12 ENCOUNTER — Other Ambulatory Visit: Payer: Self-pay | Admitting: Physician Assistant

## 2018-10-13 ENCOUNTER — Other Ambulatory Visit: Payer: Self-pay | Admitting: Physician Assistant

## 2018-11-12 ENCOUNTER — Telehealth: Payer: Self-pay | Admitting: *Deleted

## 2018-11-12 ENCOUNTER — Other Ambulatory Visit: Payer: Self-pay | Admitting: Physician Assistant

## 2018-11-12 NOTE — Telephone Encounter (Signed)
.     Cardiac Questionnaire:    Since your last visit or hospitalization:    1. Have you been having new or worsening chest pain? No   2. Have you been having new or worsening shortness of breath? No 3. Have you been having new or worsening leg swelling, wt gain, or increase in abdominal girth (pants fitting more tightly)? No   4. Have you had any passing out spells? No     Primary Cardiologist:  Muhammad Arida, MD   Patient contacted.  History reviewed.  No symptoms to suggest any unstable cardiac conditions.  Based on discussion, with current pandemic situation, we will be postponing this appointment for the patient.  If symptoms change, he has been instructed to contact our office.   Routing to C19 CANCEL pool for tracking (P CV DIV CV19 CANCEL) and assigning priority (1 = 4-6 wks, 2 = 6-12 wks, 3 = >12 wks).        .             

## 2018-11-16 ENCOUNTER — Ambulatory Visit: Payer: PPO | Admitting: Cardiovascular Disease

## 2018-11-22 NOTE — Telephone Encounter (Signed)
Virtual Visit Pre-Appointment Phone Call  Steps For Call:  1. Confirm consent - "In the setting of the current Covid19 crisis, you are scheduled for a (phone or video) visit with your provider on (date) at (time).  Just as we do with many in-office visits, in order for you to participate in this visit, we must obtain consent.  If you'd like, I can send this to your mychart (if signed up) or email for you to review.  Otherwise, I can obtain your verbal consent now.  All virtual visits are billed to your insurance company just like a normal visit would be.  By agreeing to a virtual visit, we'd like you to understand that the technology does not allow for your provider to perform an examination, and thus may limit your provider's ability to fully assess your condition.  Finally, though the technology is pretty good, we cannot assure that it will always work on either your or our end, and in the setting of a video visit, we may have to convert it to a phone-only visit.  In either situation, we cannot ensure that we have a secure connection.  Are you willing to proceed?"  2. Give patient instructions for WebEx download to smartphone as below if video visit  3. Advise patient to be prepared with any vital sign or heart rhythm information, their current medicines, and a piece of paper and pen handy for any instructions they may receive the day of their visit  4. Inform patient they will receive a phone call 15 minutes prior to their appointment time (may be from unknown caller ID) so they should be prepared to answer  5. Confirm that appointment type is correct in Epic appointment notes (video vs telephone)    TELEPHONE CALL NOTE  [name] [name] has been deemed a candidate for a follow-up tele-health visit to limit community exposure during the Covid-19 pandemic. I spoke with the patient via phone to ensure availability of phone/video source, confirm preferred email & phone number, and discuss  instructions and expectations.  I reminded [name] [name] to be prepared with any vital sign and/or heart rhythm information that could potentially be obtained via home monitoring, at the time of his visit. I reminded [name] [name] to expect a phone call at the time of his visit if his visit.  Did the patient verbally acknowledge consent to treatment? yes  [name] [name] 11/22/2018 3:25 PM   DOWNLOADING THE WEBEX SOFTWARE TO SMARTPHONE  - If Apple, go to [employer] and type in WebEx in the search bar. Download [employer], the blue/green circle. The app is free but as with any other app downloads, their phone may require them to verify saved payment information or Apple password. The patient does NOT have to create an account.  - If Android, ask patient to go to [employer] and type in WebEx in the search bar. Download [employer], the blue/green circle. The app is free but as with any other app downloads, their phone may require them to verify saved payment information or Android password. The patient does NOT have to create an account.   CONSENT FOR TELE-HEALTH VISIT - PLEASE REVIEW  I hereby voluntarily request, consent and authorize [facility] and its employed or contracted physicians, physician assistants, nurse practitioners or other licensed health care professionals (the Practitioner), to provide me with telemedicine health care services (the Services") as deemed necessary by the treating Practitioner. I  acknowledge and consent to receive the Services by the Practitioner via telemedicine. I understand that the telemedicine visit will involve communicating with the Practitioner through live audiovisual communication technology and the disclosure of certain medical information by electronic transmission. I acknowledge that I have been given the opportunity to request an in-person assessment or other available alternative prior to the telemedicine visit  and am voluntarily participating in the telemedicine visit.  I understand that I have the right to withhold or withdraw my consent to the use of telemedicine in the course of my care at any time, without affecting my right to future care or treatment, and that the Practitioner or I may terminate the telemedicine visit at any time. I understand that I have the right to inspect all information obtained and/or recorded in the course of the telemedicine visit and may receive copies of available information for a reasonable fee.  I understand that some of the potential risks of receiving the Services via telemedicine include:   Delay or interruption in medical evaluation due to technological equipment failure or disruption;  Information transmitted may not be sufficient (e.g. poor resolution of images) to allow for appropriate medical decision making by the Practitioner; and/or   In rare instances, security protocols could fail, causing a breach of personal health information.  Furthermore, I acknowledge that it is my responsibility to provide information about my medical history, conditions and care that is complete and accurate to the best of my ability. I acknowledge that Practitioner's advice, recommendations, and/or decision may be based on factors not within their control, such as incomplete or inaccurate data provided by me or distortions of diagnostic images or specimens that may result from electronic transmissions. I understand that the practice of medicine is not an exact science and that Practitioner makes no warranties or guarantees regarding treatment outcomes. I acknowledge that I will receive a copy of this consent concurrently upon execution via email to the email address I last provided but may also request a printed copy by calling the office of [place].    I understand that my insurance will be billed for this visit.   I have read or had this consent read to me.  I understand  the contents of this consent, which adequately explains the benefits and risks of the Services being provided via telemedicine.   I have been provided ample opportunity to ask questions regarding this consent and the Services and have had my questions answered to my satisfaction.  I give my informed consent for the services to be provided through the use of telemedicine in my medical care  By participating in this telemedicine visit I agree to the above.

## 2018-11-23 ENCOUNTER — Other Ambulatory Visit: Payer: Self-pay

## 2018-11-23 ENCOUNTER — Telehealth (INDEPENDENT_AMBULATORY_CARE_PROVIDER_SITE_OTHER): Payer: PPO | Admitting: Cardiovascular Disease

## 2018-11-23 DIAGNOSIS — I2510 Atherosclerotic heart disease of native coronary artery without angina pectoris: Secondary | ICD-10-CM | POA: Diagnosis not present

## 2018-11-23 DIAGNOSIS — E785 Hyperlipidemia, unspecified: Secondary | ICD-10-CM

## 2018-11-23 DIAGNOSIS — I110 Hypertensive heart disease with heart failure: Secondary | ICD-10-CM | POA: Diagnosis not present

## 2018-11-23 DIAGNOSIS — I5022 Chronic systolic (congestive) heart failure: Secondary | ICD-10-CM

## 2018-11-23 MED ORDER — ATORVASTATIN CALCIUM 40 MG PO TABS: 40.0000 mg | ORAL_TABLET | Freq: Every day | ORAL | 6 refills | Status: DC

## 2018-11-23 MED ORDER — FUROSEMIDE 40 MG PO TABS: 40.0000 mg | ORAL_TABLET | Freq: Every day | ORAL | 2 refills | Status: DC

## 2018-11-23 MED ORDER — CARVEDILOL 12.5 MG PO TABS: 12.5000 mg | ORAL_TABLET | Freq: Two times a day (BID) | ORAL | 2 refills | Status: DC

## 2018-11-23 MED ORDER — VALSARTAN 40 MG PO TABS: 40.0000 mg | ORAL_TABLET | Freq: Every day | ORAL | 6 refills | Status: DC

## 2018-11-23 NOTE — Patient Instructions (Signed)
Medication Instructions:  Continue same medications If you need a refill on your cardiac medications before your next appointment, please call your pharmacy.   Lab work: None If you have labs (blood work) drawn today and your tests are completely normal, you will receive your results only by: . MyChart Message (if you have MyChart) OR . A paper copy in the mail If you have any lab test that is abnormal or we need to change your treatment, we will call you to review the results.  Testing/Procedures: None  Follow-Up: At [facility], you and your health needs are our priority.  As part of our continuing mission to provide you with exceptional heart care, we have created designated Provider Care Teams.  These Care Teams include your primary Cardiologist (physician) and Advanced Practice Providers (APPs -  Physician Assistants and Nurse Practitioners) who all work together to provide you with the care you need, when you need it. You will need a follow up appointment in 4 months.  Please call our office 2 months in advance to schedule this appointment.  You may see No primary care provider on file. or one of the following Advanced Practice Providers on your designated Care Team:   Christopher Berge, NP Ryan Dunn, PA-C . Jacquelyn Visser, PA-C  Any Other Special Instructions Will Be Listed Below (If Applicable).    

## 2018-11-23 NOTE — Progress Notes (Signed)
Virtual Visit via Telephone Note    Evaluation Performed:  Follow-up visit  This visit type was conducted due to national recommendations for restrictions regarding the COVID-19 Pandemic (e.g. social distancing).  This format is felt to be most appropriate for this patient at this time.  All issues noted in this document were discussed and addressed.  No physical exam was performed (except for noted visual exam findings with Video Visits).  Please refer to the patient's chart (MyChart message for video visits and phone note for telephone visits) for the patient's consent to telehealth for [facility].  Date:  11/23/2018   ID:  [name] [name], DOB 1940-09-14, MRN [id number]  Patient Location:    Provider location:   [facility]  PCP:  [name], MD  Cardiologist:  Kirke Corin Electrophysiologist:  None   Chief Complaint:  follow up   History of Present Illness:    [name] [name] is a 78 y.o. male who presents via audio/video conferencing for a telehealth visit today.   He has known history of CAD s/p 5 vessel CABG in 05/2014 in the setting of NSTEMI with acute systolic CHF mixedICMand NICM,  small AAA, ACEi-induced hyperkalemia, frequent PVCs, DM2 with diabetic neuropathy, HTN, HLD, and obesity who presents for routine follow up of his ICM and frequent PVCs.  Most recent cardiac cath in 04/2015 showed 3 of 5 patent grafts with new occlusions of the vein grafts to the OM1 and OM2. The native marginals were small and not felt to be amenable to PCI.   He has worsening LV systolic functionto 30-35% in 02/8294 due to very frequent PVCs with Holter monitoring in 06/2015 demonstrating 28,000 PVCs and was placed on mexiletine therapy with significant improvement.   He could not afford Entresto due to cost.  Most recent echocardiogram in November 2018 showed an EF of 35 to 40%.  He has been doing reasonably well from a cardiac standpoint with no worsening shortness of breath.   His blood pressure today was 111/67.  His weight was 260 pounds. He follows with his primary care physician regularly.  He does have gradually increasing PSA which is currently being monitored closely.  The patient does not have symptoms concerning for COVID-19 infection (fever, chills, cough, or new shortness of breath).    Prior CV studies:   The following studies were reviewed today:    Past Medical History:  Diagnosis Date  . AAA (abdominal aortic aneurysm) (HCC)   . ACE inhibitor associated hyperkalemia 09/20/2015  . Arthritis   . CAD (coronary artery disease)    a. 03/2014 NSTEMI--> 05/2014 CABG x 5 (LIMA->D1, VG->LAD, VG->OM1, VG->OM2, VG->RPDA);  c. 04/2015 Cath: LM nl, LAD 100ost, 40d, RI 80, LCX 80p/m, OM1 70, OM2 80, RCA 20p, 42m, 90d, VG->dLAD min irregs, VG->OM1 100, VG->OM2 100, VG->RPDA nl, LIMA->D2 nl.  . Chronic systolic CHF (congestive heart failure) (HCC)    a. EF 25-30% by 2D ECHO 04/20/14. Severely dec global hypokinesis. mildly elevated RVSP;  b. 12/2014 Echo: EF 40-45%, mod dil LA, mildly dil RV/RA;  c. 04/2015 Echo: EF 30-35%, sev antsept HK, Gr1 DD, mildly dil RA/LA;  d. 07/2015 Echo: EF 35-40%, ant/antsept HK, Gr1 DD, mildly red RV fxn.  . Diabetes mellitus without complication (HCC)   . Epigastric hernia   . Frequent PVCs    a. 24 hr Holter 06/2015: >28K PVCs accounting for 25% of all beats, rare PAC-->mexilitene started;  b. 07/2015 Holter: 1700 PVC's/24 hrs.  [name] GERD (  gastroesophageal reflux disease)   . Gout   . Hyperlipidemia   . Hypertension   . Ischemic cardiomyopathy    a. EF 25-30% 03/2014;  b. EF 40-45% 12/2014.  . Mitral regurgitation    a. 03/2014 TEE: mild to mod MR.  . Nephrolithiasis   . Neuromuscular disorder (HCC)    DIABETIC NEUROPATHY  . Obesity   . Umbilical hernia    Past Surgical History:  Procedure Laterality Date  . CARDIAC CATHETERIZATION     [facility]  . CARDIAC CATHETERIZATION N/A 05/23/2015   Procedure: Left Heart Cath and Coronary  Angiography;  Surgeon: Iran Ouch, MD;  Location: [facility] INVASIVE CV LAB;  Service: Cardiovascular;  Laterality: N/A;  . CARPAL TUNNEL RELEASE Right   . CORONARY ARTERY BYPASS GRAFT N/A 06/06/2014   Procedure: CORONARY ARTERY BYPASS GRAFTING (CABG) x 5 USING LEFT AND RIGHT SAPHENOUS LEG VEIN HARVESTED ENDOSCOPICALLY;  Surgeon: Kerin Perna, MD;  Location: [facility] OR;  Service: Open Heart Surgery;  Laterality: N/A;  . ESOPHAGOGASTRODUODENOSCOPY (EGD) WITH PROPOFOL N/A 02/11/2016   Procedure: ESOPHAGOGASTRODUODENOSCOPY (EGD) WITH PROPOFOL with dialation;  Surgeon: [name], MD;  Location: [facility];  Service: Endoscopy;  Laterality: N/A;  diabetic, oral meds  . INTRAOPERATIVE TRANSESOPHAGEAL ECHOCARDIOGRAM N/A 06/06/2014   Procedure: INTRAOPERATIVE TRANSESOPHAGEAL ECHOCARDIOGRAM;  Surgeon: Kerin Perna, MD;  Location[facility];  Service: Open Heart Surgery;  Laterality: N/A;  . ROTATOR CUFF REPAIR Right 1996,1997,2000  . TEE WITHOUT CARDIOVERSION N/A 04/24/2014   Procedure: TRANSESOPHAGEAL ECHOCARDIOGRAM (TEE);  Surgeon: [name], MD;  Location: [facility] ENDOSCOPY;  Service: Cardiovascular;  Laterality: N/A;     Current Meds  Medication Sig  . aspirin EC 81 MG tablet Take 1 tablet (81 mg total) by mouth daily.  [name] atorvastatin (LIPITOR) 40 MG tablet Take 1 tablet (40 mg total) by mouth daily at 6 PM.  . carvedilol (COREG) 12.5 MG tablet Take 1 tablet (12.5 mg total) by mouth 2 (two) times daily with a meal.  . colchicine 0.6 MG tablet Take 0.6 mg by mouth daily as needed (for gout flareup).   . diphenhydramine-acetaminophen (TYLENOL PM) 25-500 MG TABS tablet Take 1 tablet by mouth at bedtime as needed.  . furosemide (LASIX) 40 MG tablet Take 1 tablet (40 mg total) by mouth daily.  [name] gabapentin (NEURONTIN) 300 MG capsule Take 300 mg by mouth 4 (four) times daily.   [name] mexiletine (MEXITIL) 150 MG capsule TAKE 2 CAPSULES BY MOUTH TWICE DAILY  . nitroGLYCERIN (NITROSTAT) 0.4 MG SL tablet  Place 1 tablet (0.4 mg total) under the tongue every 5 (five) minutes as needed for chest pain.  . valsartan (DIOVAN) 40 MG tablet Take 1 tablet (40 mg total) by mouth daily.  . [DISCONTINUED] atorvastatin (LIPITOR) 40 MG tablet TAKE 1 TABLET BY MOUTH ONCE DAILY (NEEDS  APPOINTMENT  FOR  FURTHER  REFILLS)  . [DISCONTINUED] carvedilol (COREG) 12.5 MG tablet TAKE 1 TABLET BY MOUTH TWICE DAILY. PLEASE SCHEDULE AN APPOINTMENT FOR FURTHER REFILLS.  . [DISCONTINUED] furosemide (LASIX) 40 MG tablet Take 1 tablet by mouth once daily  . [DISCONTINUED] mexiletine (MEXITIL) 150 MG capsule Take 1 capsule (150 mg total) by mouth 3 (three) times daily.  . [DISCONTINUED] valsartan (DIOVAN) 40 MG tablet Take 1 tablet by mouth once daily     Allergies:   Glimepiride   Social History   Tobacco Use  . Smoking status: Former Smoker    Packs/day: 1.00    Years: 25.00  Pack years: 25.00    Types: Cigarettes  . Smokeless tobacco: Former Neurosurgeon    Types: Chew  . Tobacco comment: quit early 1990's  Substance Use Topics  . Alcohol use: No  . Drug use: No     Family Hx: The patient's family history includes Colon polyps in his brother; Lung cancer in his brother; Stroke in his father; Throat cancer in his father. There is no history of Prostate cancer, Bladder Cancer, or Kidney cancer.  ROS:   Please see the history of present illness.     All other systems reviewed and are negative.   Labs/Other Tests and Data Reviewed:    Recent Labs: No results found for requested labs within last 8760 hours.   Recent Lipid Panel Lab Results  Component Value Date/Time   CHOL 95 (L) 12/25/2014 12:15 PM   TRIG 129 12/25/2014 12:15 PM   HDL 25 (L) 12/25/2014 12:15 PM   CHOLHDL 3.8 12/25/2014 12:15 PM   CHOLHDL 3.8 04/24/2014 04:07 AM   LDLCALC 44 12/25/2014 12:15 PM    Wt Readings from Last 3 Encounters:  04/21/18 276 lb (125.2 kg)  02/18/18 275 lb 12 oz (125.1 kg)  08/31/17 260 lb 12 oz (118.3 kg)      Objective:    Vital Signs:  There were no vitals taken for this visit.     ASSESSMENT & PLAN:    1.  Chronic systolic heart failure: His weight went down to 260 pounds and has been stable overall according to him.  He is not having symptoms suggestive of decompensated heart failure.  Continue same dose of furosemide, carvedilol and valsartan.  He could not afford Entresto.  I reviewed his labs done in January and they were stable overall.  2.  Coronary artery disease involving native coronary arteries: No anginal symptoms at the present time.  Continue medical therapy.  3.  Frequent PVCs: He seems to be asymptomatic.  Currently on mexiletine 150 mg twice daily.  4.  Essential hypertension: Blood pressures controlled on his home readings.  5.  Hyperlipidemia: Continue atorvastatin.  Most recent LDL was 30 in July 2019.  COVID-19 Education: The signs and symptoms of COVID-19 were discussed with the patient and how to seek care for testing (follow up with PCP or arrange E-visit).  The importance of social distancing was discussed today.  Patient Risk:   After full review of this patient's clinical status, I feel that they are at least moderate risk at this time.  Time:   Today, I have spent 22 minutes with the patient with telehealth technology discussing .     Medication Adjustments/Labs and Tests Ordered: Current medicines are reviewed at length with the patient today.  Concerns regarding medicines are outlined above.  Tests Ordered: No orders of the defined types were placed in this encounter.  Medication Changes: Meds ordered this encounter  Medications  . atorvastatin (LIPITOR) 40 MG tablet    Sig: Take 1 tablet (40 mg total) by mouth daily at 6 PM.    Dispense:  30 tablet    Refill:  6  . carvedilol (COREG) 12.5 MG tablet    Sig: Take 1 tablet (12.5 mg total) by mouth 2 (two) times daily with a meal.    Dispense:  180 tablet    Refill:  2  . furosemide (LASIX) 40 MG  tablet    Sig: Take 1 tablet (40 mg total) by mouth daily.    Dispense:  90 tablet  Refill:  2  . valsartan (DIOVAN) 40 MG tablet    Sig: Take 1 tablet (40 mg total) by mouth daily.    Dispense:  30 tablet    Refill:  6    Disposition:  Follow up in 4 month(s)  Signed, [name], MD  11/23/2018 2:39 PM    [facility]

## 2018-12-14 ENCOUNTER — Other Ambulatory Visit: Payer: Self-pay | Admitting: Cardiovascular Disease

## 2018-12-29 DIAGNOSIS — E119 Type 2 diabetes mellitus without complications: Secondary | ICD-10-CM | POA: Diagnosis not present

## 2018-12-29 DIAGNOSIS — R972 Elevated prostate specific antigen [PSA]: Secondary | ICD-10-CM | POA: Diagnosis not present

## 2019-01-03 DIAGNOSIS — R972 Elevated prostate specific antigen [PSA]: Secondary | ICD-10-CM | POA: Diagnosis not present

## 2019-01-03 DIAGNOSIS — E1159 Type 2 diabetes mellitus with other circulatory complications: Secondary | ICD-10-CM | POA: Diagnosis not present

## 2019-01-03 DIAGNOSIS — I10 Essential (primary) hypertension: Secondary | ICD-10-CM | POA: Diagnosis not present

## 2019-01-20 DIAGNOSIS — Z7984 Long term (current) use of oral hypoglycemic drugs: Secondary | ICD-10-CM | POA: Diagnosis not present

## 2019-01-20 DIAGNOSIS — E119 Type 2 diabetes mellitus without complications: Secondary | ICD-10-CM | POA: Diagnosis not present

## 2019-01-20 DIAGNOSIS — H52223 Regular astigmatism, bilateral: Secondary | ICD-10-CM | POA: Diagnosis not present

## 2019-01-20 DIAGNOSIS — H2513 Age-related nuclear cataract, bilateral: Secondary | ICD-10-CM | POA: Diagnosis not present

## 2019-01-20 DIAGNOSIS — H5203 Hypermetropia, bilateral: Secondary | ICD-10-CM | POA: Diagnosis not present

## 2019-01-20 DIAGNOSIS — H524 Presbyopia: Secondary | ICD-10-CM | POA: Diagnosis not present

## 2019-03-28 ENCOUNTER — Other Ambulatory Visit: Payer: Self-pay | Admitting: Physician Assistant

## 2019-05-03 DIAGNOSIS — R972 Elevated prostate specific antigen [PSA]: Secondary | ICD-10-CM | POA: Diagnosis not present

## 2019-05-03 DIAGNOSIS — E1159 Type 2 diabetes mellitus with other circulatory complications: Secondary | ICD-10-CM | POA: Diagnosis not present

## 2019-05-09 DIAGNOSIS — R972 Elevated prostate specific antigen [PSA]: Secondary | ICD-10-CM | POA: Diagnosis not present

## 2019-05-09 DIAGNOSIS — M109 Gout, unspecified: Secondary | ICD-10-CM | POA: Diagnosis not present

## 2019-05-09 DIAGNOSIS — E785 Hyperlipidemia, unspecified: Secondary | ICD-10-CM | POA: Diagnosis not present

## 2019-05-09 DIAGNOSIS — E1159 Type 2 diabetes mellitus with other circulatory complications: Secondary | ICD-10-CM | POA: Diagnosis not present

## 2019-07-11 ENCOUNTER — Other Ambulatory Visit: Payer: Self-pay | Admitting: Cardiovascular Disease

## 2019-07-11 NOTE — Telephone Encounter (Signed)
Please schedule overdue F/U with Dr. Arida. Thank you! 

## 2019-07-12 NOTE — Telephone Encounter (Signed)
Scheduled

## 2019-07-13 ENCOUNTER — Other Ambulatory Visit: Payer: Self-pay | Admitting: Cardiovascular Disease

## 2019-08-02 DIAGNOSIS — R972 Elevated prostate specific antigen [PSA]: Secondary | ICD-10-CM | POA: Diagnosis not present

## 2019-08-02 DIAGNOSIS — E1159 Type 2 diabetes mellitus with other circulatory complications: Secondary | ICD-10-CM | POA: Diagnosis not present

## 2019-08-08 DIAGNOSIS — E1159 Type 2 diabetes mellitus with other circulatory complications: Secondary | ICD-10-CM | POA: Diagnosis not present

## 2019-08-08 DIAGNOSIS — E669 Obesity, unspecified: Secondary | ICD-10-CM | POA: Diagnosis not present

## 2019-08-08 DIAGNOSIS — M79641 Pain in right hand: Secondary | ICD-10-CM | POA: Diagnosis not present

## 2019-08-08 DIAGNOSIS — R972 Elevated prostate specific antigen [PSA]: Secondary | ICD-10-CM | POA: Diagnosis not present

## 2019-08-08 DIAGNOSIS — Z0000 Encounter for general adult medical examination without abnormal findings: Secondary | ICD-10-CM | POA: Diagnosis not present

## 2019-08-08 DIAGNOSIS — M79642 Pain in left hand: Secondary | ICD-10-CM | POA: Diagnosis not present

## 2019-08-08 DIAGNOSIS — M1990 Unspecified osteoarthritis, unspecified site: Secondary | ICD-10-CM | POA: Diagnosis not present

## 2019-08-08 DIAGNOSIS — I10 Essential (primary) hypertension: Secondary | ICD-10-CM | POA: Diagnosis not present

## 2019-09-09 ENCOUNTER — Ambulatory Visit: Payer: PPO | Admitting: Cardiovascular Disease

## 2019-09-30 ENCOUNTER — Other Ambulatory Visit: Payer: Self-pay

## 2019-09-30 ENCOUNTER — Telehealth (INDEPENDENT_AMBULATORY_CARE_PROVIDER_SITE_OTHER): Payer: PPO | Admitting: Cardiovascular Disease

## 2019-09-30 VITALS — Ht 72.0 in | Wt 258.0 lb

## 2019-09-30 DIAGNOSIS — I714 Abdominal aortic aneurysm, without rupture, unspecified: Secondary | ICD-10-CM

## 2019-09-30 DIAGNOSIS — I5022 Chronic systolic (congestive) heart failure: Secondary | ICD-10-CM

## 2019-09-30 DIAGNOSIS — I10 Essential (primary) hypertension: Secondary | ICD-10-CM

## 2019-09-30 DIAGNOSIS — E785 Hyperlipidemia, unspecified: Secondary | ICD-10-CM

## 2019-09-30 DIAGNOSIS — I2510 Atherosclerotic heart disease of native coronary artery without angina pectoris: Secondary | ICD-10-CM

## 2019-09-30 DIAGNOSIS — I110 Hypertensive heart disease with heart failure: Secondary | ICD-10-CM

## 2019-09-30 DIAGNOSIS — I493 Ventricular premature depolarization: Secondary | ICD-10-CM | POA: Diagnosis not present

## 2019-09-30 NOTE — Progress Notes (Signed)
Virtual Visit via Telephone Note    Evaluation Performed:  Follow-up visit  This visit type was conducted due to national recommendations for restrictions regarding the COVID-19 Pandemic (e.g. social distancing).  This format is felt to be most appropriate for this patient at this time.  All issues noted in this document were discussed and addressed.  No physical exam was performed (except for noted visual exam findings with Video Visits).  Please refer to the patient's chart (MyChart message for video visits and phone note for telephone visits) for the patient's consent to telehealth for [facility]  Date:  09/30/2019   ID:  LEWIS KEATS, DOB 1940-08-29, MRN [id number]  Patient Location:    Provider location:   [facility]  PCP:  [name], MD  Cardiologist:  Kirke Corin Electrophysiologist:  None   Chief Complaint:  follow up   History of Present Illness:    IAAN OREGEL is a 79 y.o. male who presents via audio/video conferencing for a telehealth visit today.   He has known history of CAD s/p 5 vessel CABG in 05/2014 in the setting of NSTEMI with acute systolic CHF mixedICMand NICM,  small AAA, ACEi-induced hyperkalemia, frequent PVCs, DM2 with diabetic neuropathy, HTN, HLD, and obesity.  Most recent cardiac cath in 04/2015 showed 3 of 5 patent grafts with new occlusions of the vein grafts to the OM1 and OM2. The native marginals were small and not felt to be amenable to PCI.   He has worsening LV systolic function to 30-35% in 04/2015 due to very frequent PVCs with Holter monitoring in 06/2015 demonstrating 28,000 PVCs and was placed on mexiletine therapy with significant improvement.   He could not afford Entresto due to cost.  Most recent echocardiogram in November 2018 showed an EF of 35 to 40%.  He has been doing very well with no recent chest pain, shortness of breath or palpitations.  He takes his medications regularly.  No reported side effects. He saw  his primary care physician Dr. [name] in December for a physical.  I reviewed the note and the labs done there.  His blood pressure was excellent during that visit at 124/60.  His labs were overall unremarkable with stable creatinine at 1.3.   The patient does not have symptoms concerning for COVID-19 infection (fever, chills, cough, or new shortness of breath).    Prior CV studies:   The following studies were reviewed today:    Past Medical History:  Diagnosis Date  . AAA (abdominal aortic aneurysm) (HCC)   . ACE inhibitor associated hyperkalemia 09/20/2015  . Arthritis   . CAD (coronary artery disease)    a. 03/2014 NSTEMI--> 05/2014 CABG x 5 (LIMA->D1, VG->LAD, VG->OM1, VG->OM2, VG->RPDA);  c. 04/2015 Cath: LM nl, LAD 100ost, 40d, RI 80, LCX 80p/m, OM1 70, OM2 80, RCA 20p, 39m, 90d, VG->dLAD min irregs, VG->OM1 100, VG->OM2 100, VG->RPDA nl, LIMA->D2 nl.  . Chronic systolic CHF (congestive heart failure) (HCC)    a. EF 25-30% by 2D ECHO 04/20/14. Severely dec global hypokinesis. mildly elevated RVSP;  b. 12/2014 Echo: EF 40-45%, mod dil LA, mildly dil RV/RA;  c. 04/2015 Echo: EF 30-35%, sev antsept HK, Gr1 DD, mildly dil RA/LA;  d. 07/2015 Echo: EF 35-40%, ant/antsept HK, Gr1 DD, mildly red RV fxn.  . Diabetes mellitus without complication (HCC)   . Epigastric hernia   . Frequent PVCs    a. 24 hr Holter 06/2015: >28K PVCs accounting for 25% of all beats, rare  PAC-->mexilitene started;  b. 07/2015 Holter: 1700 PVC's/24 hrs.  [name] GERD (gastroesophageal reflux disease)   . Gout   . Hyperlipidemia   . Hypertension   . Ischemic cardiomyopathy    a. EF 25-30% 03/2014;  b. EF 40-45% 12/2014.  . Mitral regurgitation    a. 03/2014 TEE: mild to mod MR.  . Nephrolithiasis   . Neuromuscular disorder ([place])    DIABETIC NEUROPATHY  . Obesity   . Umbilical hernia    Past Surgical History:  Procedure Laterality Date  . CARDIAC CATHETERIZATION     [facility]  . CARDIAC CATHETERIZATION N/A 05/23/2015    Procedure: Left Heart Cath and Coronary Angiography;  Surgeon: Wellington Hampshire, MD;  Location: [place] [facility];  Service: Cardiovascular;  Laterality: N/A;  . CARPAL TUNNEL RELEASE Right   . CORONARY ARTERY BYPASS GRAFT N/A 06/06/2014   Procedure: CORONARY ARTERY BYPASS GRAFTING (CABG) x 5 USING LEFT AND RIGHT SAPHENOUS LEG VEIN HARVESTED ENDOSCOPICALLY;  Surgeon: Ivin Poot, MD;  Location: [place];  Service: Open Heart Surgery;  Laterality: N/A;  . ESOPHAGOGASTRODUODENOSCOPY (EGD) WITH PROPOFOL N/A 02/11/2016   Procedure: ESOPHAGOGASTRODUODENOSCOPY (EGD) WITH PROPOFOL with dialation;  Surgeon: Lucilla Lame, MD;  Location: [place];  Service: Endoscopy;  Laterality: N/A;  diabetic, oral meds  . INTRAOPERATIVE TRANSESOPHAGEAL ECHOCARDIOGRAM N/A 06/06/2014   Procedure: INTRAOPERATIVE TRANSESOPHAGEAL ECHOCARDIOGRAM;  Surgeon: Ivin Poot, MD;  Location: [place];  Service: Open Heart Surgery;  Laterality: N/A;  . ROTATOR CUFF REPAIR Right 1996,1997,2000  . TEE WITHOUT CARDIOVERSION N/A 04/24/2014   Procedure: TRANSESOPHAGEAL ECHOCARDIOGRAM (TEE);  Surgeon: Thayer Headings, MD;  [facility];  Service: Cardiovascular;  Laterality: N/A;     Current Meds  Medication Sig  . aspirin EC 81 MG tablet Take 1 tablet (81 mg total) by mouth daily.  [name] atorvastatin (LIPITOR) 40 MG tablet TAKE 1 TABLET BY MOUTH ONCE DAILY AT 6 PM.  . carvedilol (COREG) 12.5 MG tablet Take 1 tablet (12.5 mg total) by mouth 2 (two) times daily with a meal.  . colchicine 0.6 MG tablet Take 0.6 mg by mouth daily as needed (for gout flareup).   . diphenhydramine-acetaminophen (TYLENOL PM) 25-500 MG TABS tablet Take 1 tablet by mouth at bedtime as needed.  . furosemide (LASIX) 40 MG tablet Take 1 tablet (40 mg total) by mouth daily.  [name] gabapentin (NEURONTIN) 300 MG capsule Take 300 mg by mouth 4 (four) times daily.   [name] mexiletine (MEXITIL) 150 MG capsule Take 2 capsules by mouth twice daily  . nitroGLYCERIN  (NITROSTAT) 0.4 MG SL tablet Place 1 tablet (0.4 mg total) under the tongue every 5 (five) minutes as needed for chest pain.  . valsartan (DIOVAN) 40 MG tablet Take 1 tablet by mouth once daily     Allergies:   Glimepiride   Social History   Tobacco Use  . Smoking status: Former Smoker    Packs/day: 1.00    Years: 25.00    Pack years: 25.00    Types: Cigarettes  . Smokeless tobacco: Former Systems developer    Types: Chew  . Tobacco comment: quit early 1990's  Substance Use Topics  . Alcohol use: No  . Drug use: No     Family Hx: The patient's family history includes Colon polyps in his brother; Lung cancer in his brother; Stroke in his father; Throat cancer in his father. There is no history of Prostate cancer, Bladder Cancer, or Kidney cancer.  ROS:   Please see  the history of present illness.     All other systems reviewed and are negative.   Labs/Other Tests and Data Reviewed:    Recent Labs: No results found for requested labs within last 8760 hours.   Recent Lipid Panel Lab Results  Component Value Date/Time   CHOL 95 (L) 12/25/2014 12:15 PM   TRIG 129 12/25/2014 12:15 PM   HDL 25 (L) 12/25/2014 12:15 PM   CHOLHDL 3.8 12/25/2014 12:15 PM   CHOLHDL 3.8 04/24/2014 04:07 AM   LDLCALC 44 12/25/2014 12:15 PM    Wt Readings from Last 3 Encounters:  09/30/19 258 lb (117 kg)  04/21/18 276 lb (125.2 kg)  02/18/18 275 lb 12 oz (125.1 kg)     Objective:    Vital Signs:  Ht 6' (1.829 m)   Wt 258 lb (117 kg)   BMI 34.99 kg/m      ASSESSMENT & PLAN:    1.  Chronic systolic heart failure: He seems to be euvolemic based on his weight trend.  His dry weight appears to be around 260 pounds.  2.  Coronary artery disease involving native coronary arteries: No anginal symptoms at the present time.  Continue medical therapy.  3.  Frequent PVCs: He seems to be asymptomatic.  Currently on mexiletine 150 mg twice daily.  We will bring him back in 3 months for an office follow-up  with EKG  4.  Essential hypertension: Blood pressure has been well controlled.  5.  Hyperlipidemia: Continue atorvastatin.  Most recent LDL was 30 in July 2019.  6.  Small abdominal aortic aneurysm: Most recent ultrasound was done measured 2.6 cm.  I will plan on repeat AAA ultrasound later this year.  COVID-19 Education: The signs and symptoms of COVID-19 were discussed with the patient and how to seek care for testing (follow up with PCP or arrange E-visit).  The importance of social distancing was discussed today.    Time:   Today, I have spent 8 minutes with the patient with telehealth technology discussing .     Medication Adjustments/Labs and Tests Ordered: Current medicines are reviewed at length with the patient today.  Concerns regarding medicines are outlined above.  Tests Ordered: No orders of the defined types were placed in this encounter.  Medication Changes: No orders of the defined types were placed in this encounter.   Disposition: Office follow-up in 3 months.  Signed, [name], MD  09/30/2019 9:29 AM    [facility]

## 2019-09-30 NOTE — Patient Instructions (Signed)
Medication Instructions:  Your physician recommends that you continue on your current medications as directed. Please refer to the Current Medication list given to you today.  *If you need a refill on your cardiac medications before your next appointment, please call your pharmacy*  Lab Work: None ordered If you have labs (blood work) drawn today and your tests are completely normal, you will receive your results only by: [name] MyChart Message (if you have MyChart) OR . A paper copy in the mail If you have any lab test that is abnormal or we need to change your treatment, we will call you to review the results.  Testing/Procedures: None ordered  Follow-Up: At [facility], you and your health needs are our priority.  As part of our continuing mission to provide you with exceptional heart care, we have created designated Provider Care Teams.  These Care Teams include your primary Cardiologist (physician) and Advanced Practice Providers (APPs -  Physician Assistants and Nurse Practitioners) who all work together to provide you with the care you need, when you need it.  Your next appointment:   3 month(s)  The format for your next appointment:   In Person  Provider:    You may see  Dr. Kirke Corin or one of the following Advanced Practice Providers on your designated Care Team:    Nicolasa Ducking, NP  Eula Listen, PA-C  Marisue Ivan, PA-C   Other Instructions N/A

## 2019-10-10 ENCOUNTER — Other Ambulatory Visit: Payer: Self-pay | Admitting: Cardiovascular Disease

## 2019-10-12 ENCOUNTER — Other Ambulatory Visit: Payer: Self-pay | Admitting: Family Medicine

## 2019-10-14 ENCOUNTER — Other Ambulatory Visit: Payer: Self-pay | Admitting: Family Medicine

## 2019-11-09 ENCOUNTER — Other Ambulatory Visit: Payer: Self-pay | Admitting: Family Medicine

## 2019-11-09 DIAGNOSIS — I714 Abdominal aortic aneurysm, without rupture, unspecified: Secondary | ICD-10-CM

## 2019-11-15 ENCOUNTER — Other Ambulatory Visit: Payer: Self-pay

## 2019-11-15 ENCOUNTER — Other Ambulatory Visit: Payer: Self-pay | Admitting: Physician Assistant

## 2019-11-15 ENCOUNTER — Ambulatory Visit
Admission: RE | Admit: 2019-11-15 | Discharge: 2019-11-15 | Disposition: A | Discharge status: Home / Self Care | Payer: PPO | Source: Ambulatory Visit | Attending: Family Medicine | Admitting: Family Medicine

## 2019-11-15 ENCOUNTER — Other Ambulatory Visit: Payer: Self-pay | Admitting: Cardiovascular Disease

## 2019-11-15 DIAGNOSIS — I714 Abdominal aortic aneurysm, without rupture, unspecified: Secondary | ICD-10-CM

## 2019-11-15 IMAGING — US US AORTA SCREENING (MEDICARE)
1 series · 14 of 25 positions shown · non-contrast
Comparison: None.

CLINICAL DATA: Evaluate abdominal aortic aneurysm. History of CAD,
hypertension and hyperlipidemia.

EXAM:
ULTRASOUND OF ABDOMINAL AORTA
TECHNIQUE: Ultrasound examination of the abdominal aorta and proximal common
iliac arteries was performed to evaluate for aneurysm. Additional
color and Doppler images of the distal aorta were obtained to
document patency.

[Series 1: us aorta screening (medicare) · 0.33mm/px · 14 of 25 slices shown]
[im 1/25]
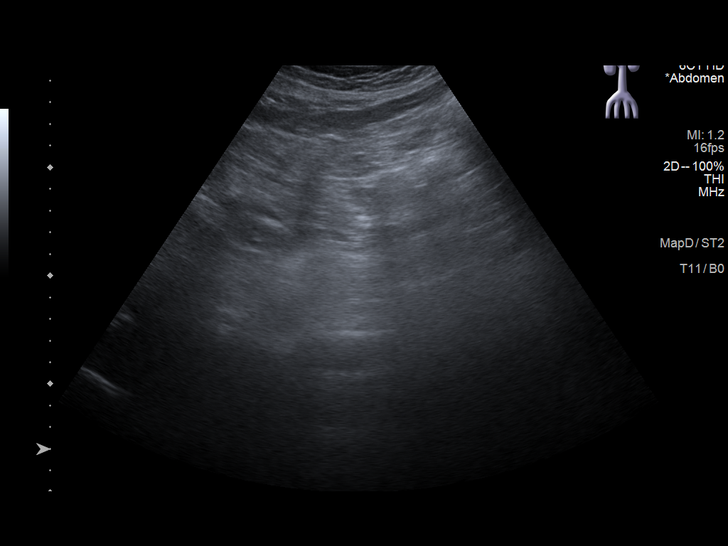
[im 3/25]
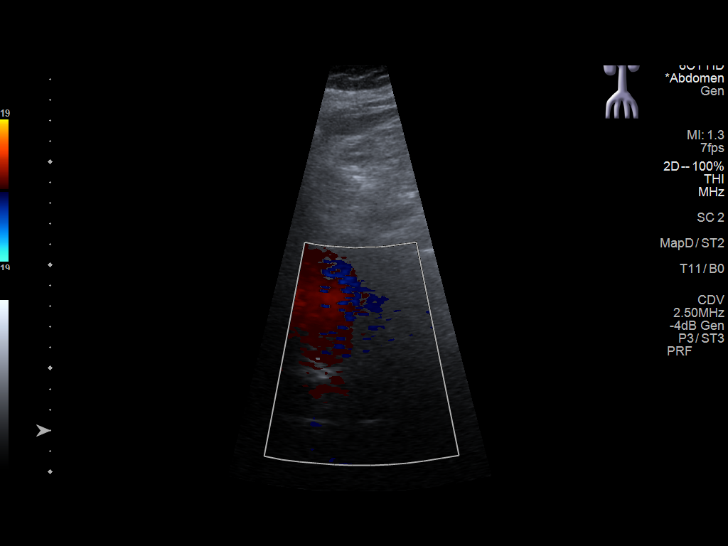
[im 5/25]
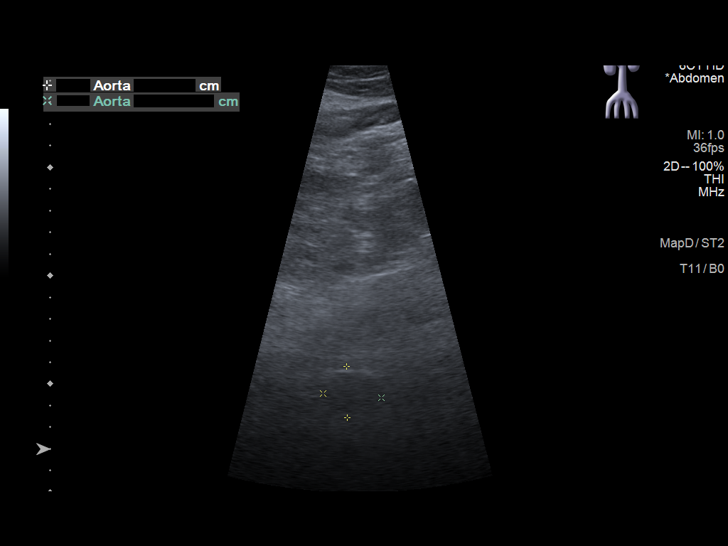
[im 7/25]
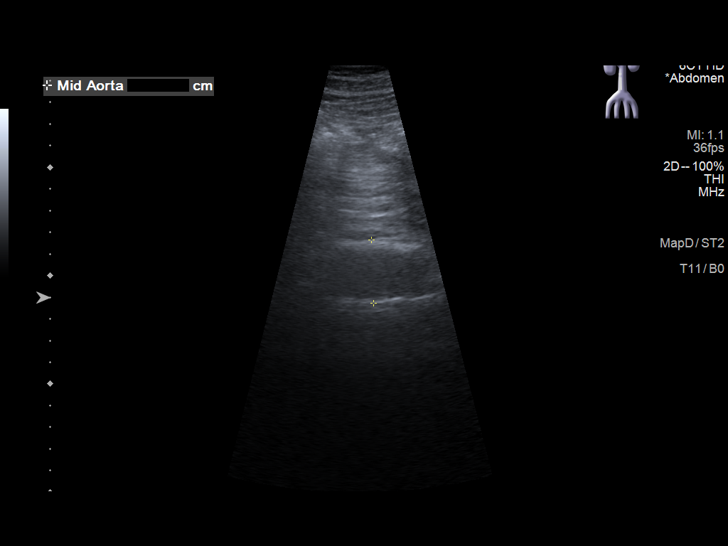
[im 9/25]
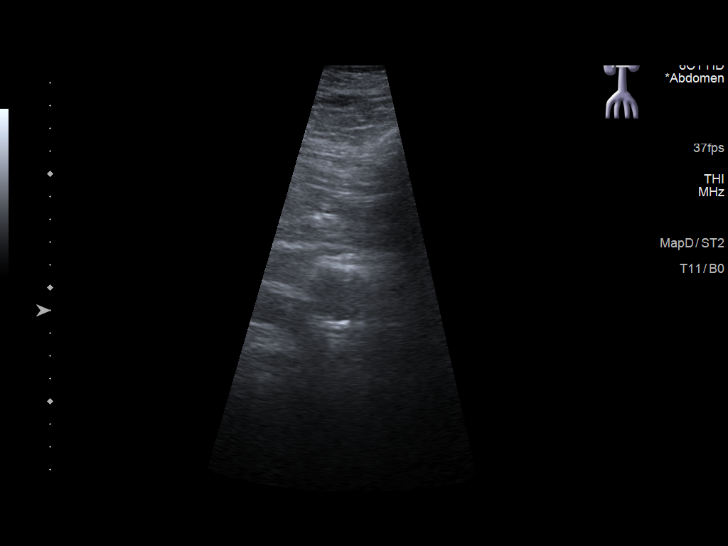
[im 10/25]
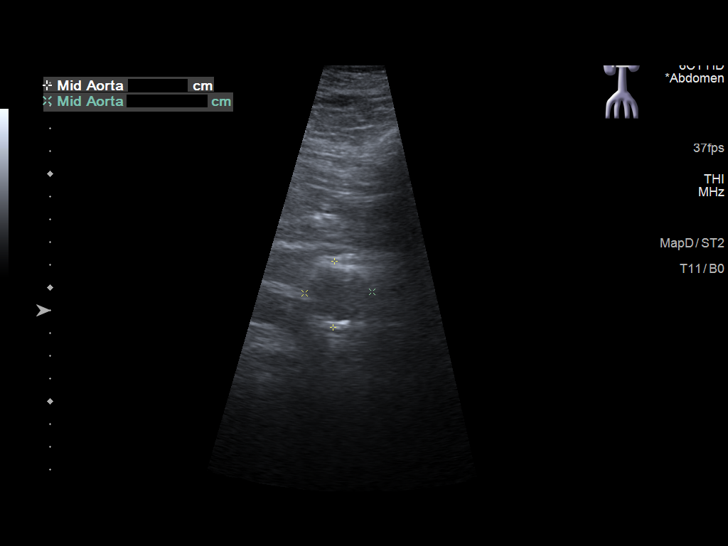
[im 12/25]
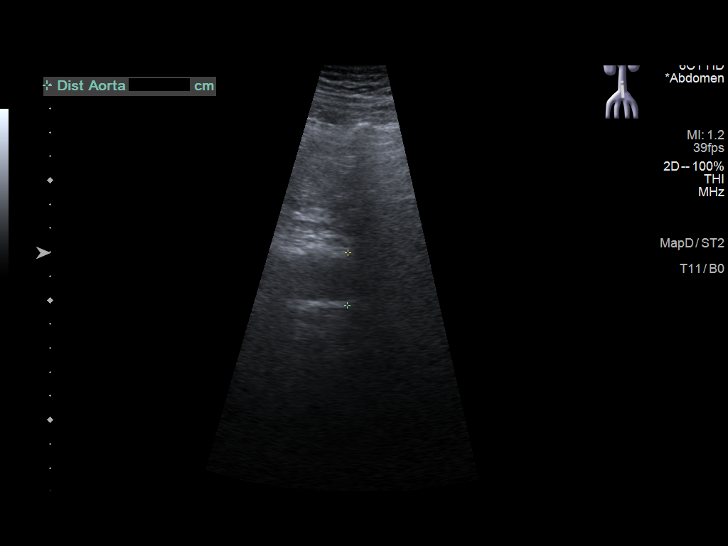
[im 14/25]
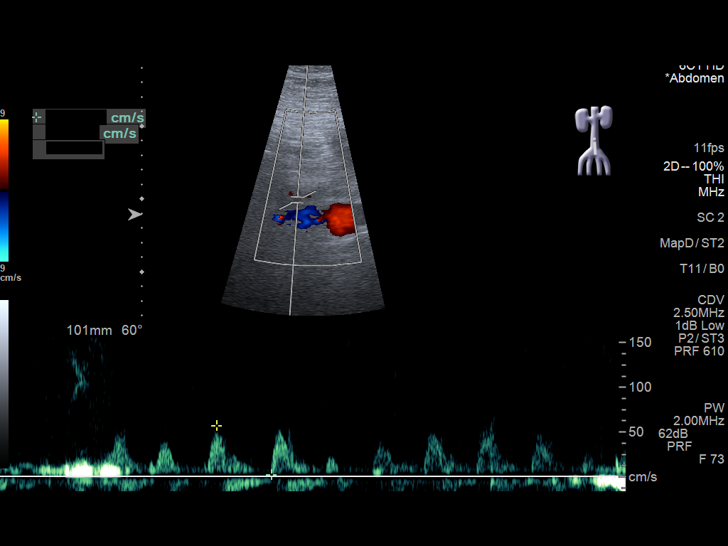
[im 16/25]
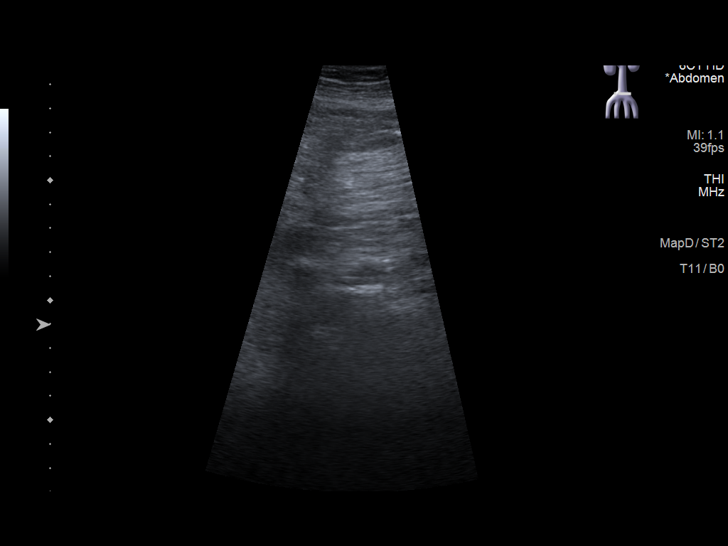
[im 17/25]
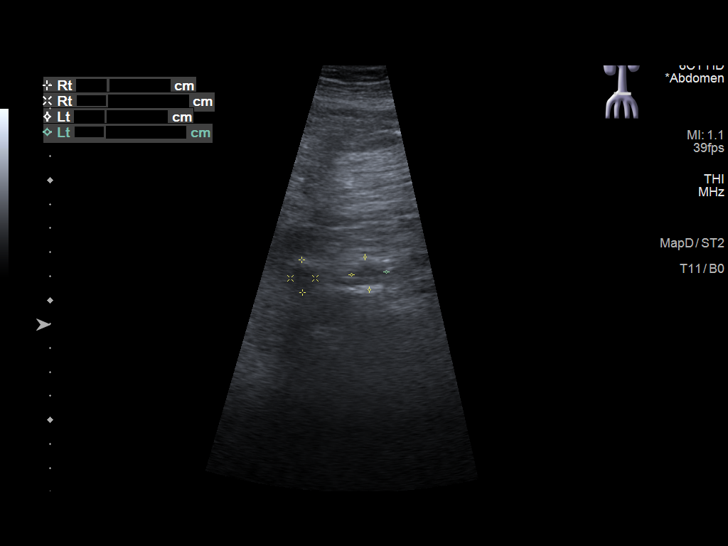
[im 19/25]
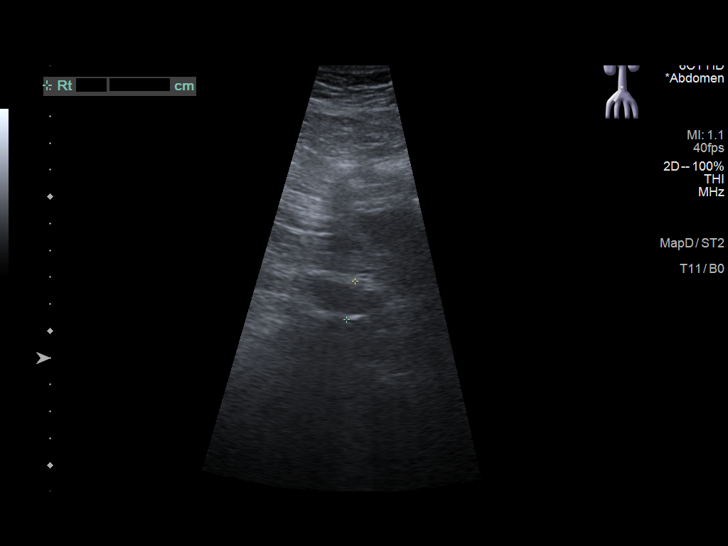
[im 21/25]
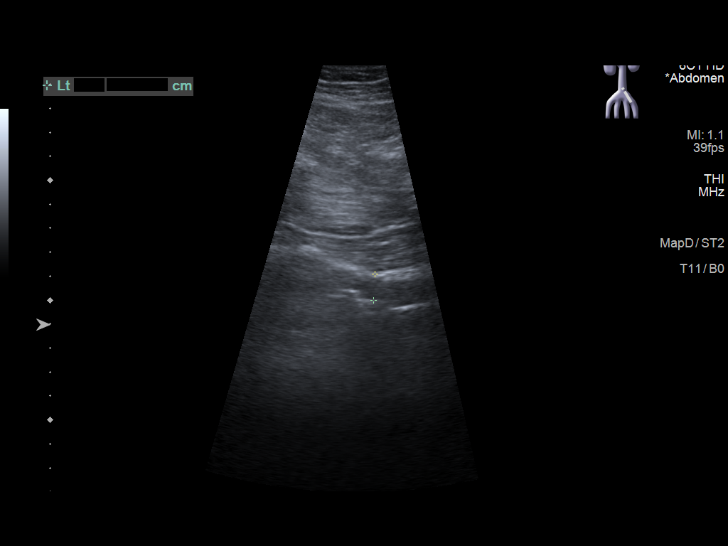
[im 23/25]
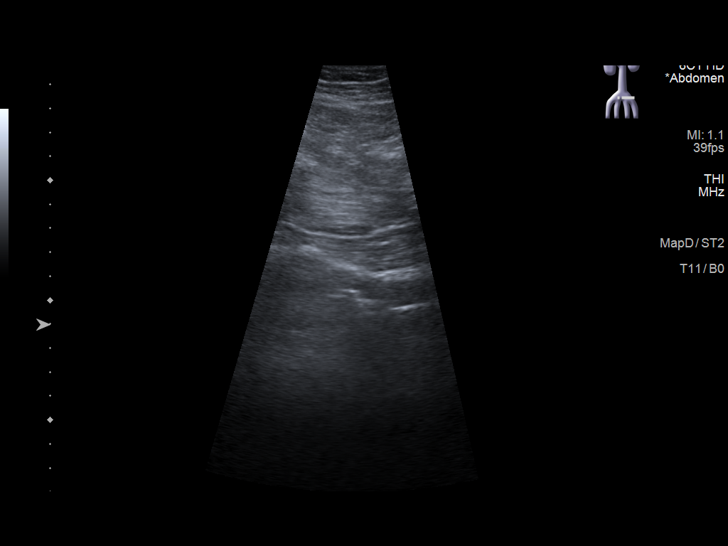
[im 25/25]
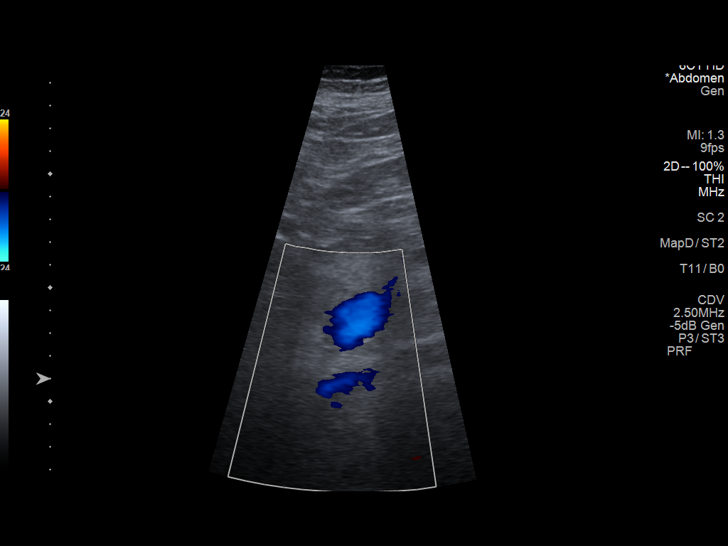

[14 of 25 positions shown; findings below may reference images not displayed]

FINDINGS: Examination is degraded due to patient body habitus and poor
sonographic window

Abdominal aortic measurements as follows:

Proximal:  2.7 x 2.4 cm

Mid:  3.0 x 2.9 cm, previously, 2.8 x 2.6 cm

Distal:  2.2 x 1.8 cm
Patent: Yes, peak systolic velocity is 57.3 cm/s

Right common iliac artery: 1.4 x 1.0 cm

Left common iliac artery: 1.5 x 1.4 cm
IMPRESSION: Suspected slight increase in aneurysmal dilatation the mid aspect of
the abdominal aorta on this body habitus degraded examination,
currently measuring 3 cm in maximal diameter, previously, 2.8 cm.
Abdominal Aortic Aneurysm ([DM]-[DM]). Recommend follow-up aortic
ultrasound in 3 years. This recommendation follows ACR consensus
guidelines: White Paper of the ACR Incidental Findings Committee II

## 2019-12-06 DIAGNOSIS — B372 Candidiasis of skin and nail: Secondary | ICD-10-CM | POA: Diagnosis not present

## 2019-12-23 NOTE — Progress Notes (Signed)
Cardiology Office Note    Date:  12/29/2019   ID:  [name] [name], DOB March 13, 1941, MRN [id number]  PCP:  [name] Mina, MD  Cardiologist:  [name] Bears, MD  Electrophysiologist:  [name] Manges, MD   Chief Complaint: Follow-up  History of Present Illness:   [name] [name] is a 79 y.o. 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  Most recent cardiac cath in 04/2015 showed 3-5 patent grafts with new occlusions of the SVG to OM1 and OM 2.  The native marginals were small and not felt to be amenable to PCI.  He had worsening LV systolic function with an EF of 30 to 35% in 04/2015 secondary to frequent PVCs with Holter monitoring in 06/2015 demonstrating 28,000 PVCs.  In this setting, he was placed on mexiletine with significant improvement.  Follow up Holter monitor in 07/2015 showed significant improvement in PVC burden down to 1724. Follow-up echo in 07/2015 showed modestly improved LV function, with an EF of 35-40%.  He underwent repeat Holter monitoring in 10/2015 with a decreased dose of mexiletine that showed 7001 PVCs.  Repeat 24-hour Holter on 07/27/2017 showed 9,240 PVCs (9%).  He is followed by EP for this.  He was unable to afford Entresto secondary to cost.  Echo from 06/2017 showed an EF of 35 to 40%.  He was last seen virtually in 09/2019 and doing well from a cardiac perspective.  Most recent aortic ultrasound from 11/15/2019 showed a suspected slight increase in aneurysmal dilatation estimated at 3 cm (previously 2.8 cm).  He comes in doing well from a cardiac perspective.  He denies any chest pain, dyspnea, palpitations, dizziness, presyncope, syncope, lower extremity swelling, abdominal  distention, orthopnea, PND, early satiety.  His weight has been stable at home.  Tolerating all medications without issues.  His main complaint today is a recent gout flareup involving the right ankle which seems to be improving.  He denies any recent falls, hematochezia, or melena.   Labs independently reviewed: 07/2019 - potassium 4.4, BUN 29, serum creatinine 1.3, albumin 4.2, AST/ALT normal, A1c 7.0 02/2018 - Hgb 15.6, PLT 157, TC 81, TG 142, HDL 22, LDL 30  Past Medical History:  Diagnosis Date  . AAA (abdominal aortic aneurysm) (HCC)   . ACE inhibitor associated hyperkalemia 09/20/2015  . Arthritis   . CAD (coronary artery disease)    a. 03/2014 NSTEMI--> 05/2014 CABG x 5 (LIMA->D1, VG->LAD, VG->OM1, VG->OM2, VG->RPDA);  c. 04/2015 Cath: LM nl, LAD 100ost, 40d, RI 80, LCX 80p/m, OM1 70, OM2 80, RCA 20p, 14m, 90d, VG->dLAD min irregs, VG->OM1 100, VG->OM2 100, VG->RPDA nl, LIMA->D2 nl.  . Chronic systolic CHF (congestive heart failure) (HCC)    a. EF 25-30% by 2D ECHO 04/20/14. Severely dec global hypokinesis. mildly elevated RVSP;  b. 12/2014 Echo: EF 40-45%, mod dil LA, mildly dil RV/RA;  c. 04/2015 Echo: EF 30-35%, sev antsept HK, Gr1 DD, mildly dil RA/LA;  d. 07/2015 Echo: EF 35-40%, ant/antsept HK, Gr1 DD, mildly red RV fxn.  . Diabetes mellitus without complication (HCC)   . Epigastric hernia   . Frequent PVCs    a. 24 hr Holter 06/2015: >28K PVCs  accounting for 25% of all beats, rare PAC-->mexilitene started;  b. 07/2015 Holter: 1700 PVC's/24 hrs.  [name] GERD (gastroesophageal reflux disease)   . Gout   . Hyperlipidemia   . Hypertension   . Ischemic cardiomyopathy    a. EF 25-30% 03/2014;  b. EF 40-45% 12/2014.  . Mitral regurgitation    a. 03/2014 TEE: mild to mod MR.  . Nephrolithiasis   . Neuromuscular disorder (HCC)    DIABETIC NEUROPATHY  . Obesity   . Umbilical hernia     Past Surgical History:  Procedure Laterality Date  . CARDIAC CATHETERIZATION     [facility]  . CARDIAC  CATHETERIZATION N/A 05/23/2015   Procedure: Left Heart Cath and Coronary Angiography;  Surgeon: Iran Ouch, MD;  Location: [facility];  Service: Cardiovascular;  Laterality: N/A;  . CARPAL TUNNEL RELEASE Right   . CORONARY ARTERY BYPASS GRAFT N/A 06/06/2014   Procedure: CORONARY ARTERY BYPASS GRAFTING (CABG) x 5 USING LEFT AND RIGHT SAPHENOUS LEG VEIN HARVESTED ENDOSCOPICALLY;  Surgeon: Kerin Perna, MD;  Location: [facility] OR;  Service: Open Heart Surgery;  Laterality: N/A;  . ESOPHAGOGASTRODUODENOSCOPY (EGD) WITH PROPOFOL N/A 02/11/2016   Procedure: ESOPHAGOGASTRODUODENOSCOPY (EGD) WITH PROPOFOL with dialation;  Surgeon: [name], MD;  Location: [facility];  Service: Endoscopy;  Laterality: N/A;  diabetic, oral meds  . INTRAOPERATIVE TRANSESOPHAGEAL ECHOCARDIOGRAM N/A 06/06/2014   Procedure: INTRAOPERATIVE TRANSESOPHAGEAL ECHOCARDIOGRAM;  Surgeon: Kerin Perna, MD;  Location: [facility] OR;  Service: Open Heart Surgery;  Laterality: N/A;  . ROTATOR CUFF REPAIR Right 1996,1997,2000  . TEE WITHOUT CARDIOVERSION N/A 04/24/2014   Procedure: TRANSESOPHAGEAL ECHOCARDIOGRAM (TEE);  Surgeon: [name], MD;  Location: [facility] ENDOSCOPY;  Service: Cardiovascular;  Laterality: N/A;    Current Medications: Current Meds  Medication Sig  . aspirin EC 81 MG tablet Take 1 tablet (81 mg total) by mouth daily.  [name] atorvastatin (LIPITOR) 40 MG tablet TAKE 1 TABLET BY MOUTH ONCE DAILY AT 6 PM.  . carvedilol (COREG) 12.5 MG tablet TAKE 1 TABLET BY MOUTH TWICE DAILY WITH A MEAL  . clotrimazole-betamethasone (LOTRISONE) cream APPLY CREAM TO AFFECTED AREA TWICE DAILY  . colchicine 0.6 MG tablet Take 0.6 mg by mouth daily as needed (for gout flareup).   . diphenhydramine-acetaminophen (TYLENOL PM) 25-500 MG TABS tablet Take 1 tablet by mouth at bedtime as needed.  . furosemide (LASIX) 40 MG tablet Take 1 tablet by mouth once daily  . gabapentin (NEURONTIN) 300 MG capsule Take 300 mg by mouth 4 (four)  times daily.   [name] mexiletine (MEXITIL) 150 MG capsule Take 2 capsules by mouth twice daily  . nitroGLYCERIN (NITROSTAT) 0.4 MG SL tablet Place 1 tablet (0.4 mg total) under the tongue every 5 (five) minutes as needed for chest pain.  . valsartan (DIOVAN) 40 MG tablet Take 1 tablet by mouth once daily    Allergies:   Glimepiride   Social History   Socioeconomic History  . Marital status: Single    Spouse name: Not on file  . Number of children: Not on file  . Years of education: Not on file  . Highest education level: Not on file  Occupational History  . Not on file  Tobacco Use  . Smoking status: Former Smoker    Packs/day: 1.00    Years: 25.00    Pack years: 25.00    Types: Cigarettes  . Smokeless tobacco: Former Neurosurgeon    Types: Chew  . Tobacco comment: quit early 1990's  Substance and Sexual Activity  . Alcohol use: No  . Drug use: No  . Sexual activity: Not on file  Other Topics Concern  . Not on file  Social History Narrative  . Not on file   Social Determinants of Health   Financial Resource Strain:   . Difficulty of Paying Living Expenses:   Food Insecurity:   . Worried About Programme researcher, broadcasting/film/video in the Last Year:   . Barista in the Last Year:   Transportation Needs:   . Freight forwarder (Medical):   [name] Lack of Transportation (Non-Medical):   Physical Activity:   . Days of Exercise per Week:   . Minutes of Exercise per Session:   Stress:   . Feeling of Stress :   Social Connections:   . Frequency of Communication with Friends and Family:   . Frequency of Social Gatherings with Friends and Family:   . Attends Religious Services:   . Active Member of Clubs or Organizations:   . Attends Banker Meetings:   [name] Marital Status:      Family History:  The patient's family history includes Colon polyps in his brother; Lung cancer in his brother; Stroke in his father; Throat cancer in his father. There is no history of Prostate cancer,  Bladder Cancer, or Kidney cancer.  ROS:   Review of Systems  Constitutional: Positive for malaise/fatigue. Negative for chills, diaphoresis, fever and weight loss.  HENT: Negative for congestion.   Eyes: Negative for discharge and redness.  Respiratory: Negative for cough, hemoptysis, sputum production, shortness of breath and wheezing.   Cardiovascular: Negative for chest pain, palpitations, orthopnea, claudication, leg swelling and PND.  Gastrointestinal: Negative for abdominal pain, blood in stool, heartburn, melena, nausea and vomiting.  Genitourinary: Negative for hematuria.  Musculoskeletal: Positive for joint pain. Negative for falls and myalgias.       Right ankle pain secondary to recent gout flare  Skin: Negative for rash.  Neurological: Negative for dizziness, tingling, tremors, sensory change, speech change, focal weakness, loss of consciousness and weakness.  Endo/Heme/Allergies: Does not bruise/bleed easily.  Psychiatric/Behavioral: Negative for substance abuse. The patient has insomnia. The patient is not nervous/anxious.        Sleeping 6 to 7 hours per night  All other systems reviewed and are negative.    EKGs/Labs/Other Studies Reviewed:    Studies reviewed were summarized above. The additional studies were reviewed today: As above.   EKG:  EKG is ordered today.  The EKG ordered today demonstrates A. fib, 84 bpm, inferior Q waves, poor R wave progression along the precordial leads, no acute ST-T changes  Recent Labs: No results found for requested labs within last 8760 hours.  Recent Lipid Panel    Component Value Date/Time   CHOL 95 (L) 12/25/2014 1215   TRIG 129 12/25/2014 1215   HDL 25 (L) 12/25/2014 1215   CHOLHDL 3.8 12/25/2014 1215   CHOLHDL 3.8 04/24/2014 0407   VLDL 23 04/24/2014 0407   LDLCALC 44 12/25/2014 1215    PHYSICAL EXAM:    VS:  BP 130/70 (BP Location: Right Arm, Patient Position: Sitting, Cuff Size: Normal)   Pulse 89   Ht 6' (1.829  m)   Wt 269 lb (122 kg)   SpO2 96%   BMI 36.48 kg/m   BMI: Body mass index is 36.48 kg/m.  Physical Exam  Constitutional: He is oriented to person, place, and time. He appears well-developed and well-nourished.  HENT:  Head: Normocephalic and atraumatic.  Eyes: Right eye exhibits no discharge. Left eye exhibits no discharge.  Neck: No JVD present.  Cardiovascular: Normal rate, S1 normal, S2 normal and normal heart sounds. An irregularly irregular rhythm present. Exam reveals no distant heart sounds, no friction rub, no midsystolic click and no opening snap.  No murmur heard. Pulses:      Posterior tibial pulses are 2+ on the left side.  Unable to palpate right posterior tibialis pulse secondary to ankle brace  Pulmonary/Chest: Effort normal and breath sounds normal. No respiratory distress. He has no decreased breath sounds. He has no wheezes. He has no rales. He exhibits no tenderness.  Abdominal: Soft. He exhibits no distension. There is no abdominal tenderness.  Musculoskeletal:        General: No edema.     Cervical back: Normal range of motion.  Neurological: He is alert and oriented to person, place, and time.  Skin: Skin is warm and dry. No cyanosis. Nails show no clubbing.  Psychiatric: He has a normal mood and affect. His speech is normal and behavior is normal. Judgment and thought content normal.    Wt Readings from Last 3 Encounters:  12/29/19 269 lb (122 kg)  09/30/19 258 lb (117 kg)  04/21/18 276 lb (125.2 kg)     ASSESSMENT & PLAN:   1. New onset A. fib with controlled ventricular response: He was unaware of this.  Arrhythmia is of uncertain chronicity.  Case was discussed in detail with Dr. Saunders Revel.  CHADS2VASc at least 5 (CHF, HTN, age x 2, vascular disease).  Start Eliquis 5 mg twice daily.  He will otherwise continue current dose carvedilol.  Check CMP, CBC, magnesium, and TSH.  Patient to follow-up with EP early next week for further recommendations.  Long  discussion with patient regarding diagnosis of A. fib including management and potential complications.  Risks and benefits of anticoagulation were discussed in detail.  2. CAD involving the native coronary arteries as well as bypass grafts without angina: He is doing well without any symptoms concerning for angina.  Continue secondary prevention with aspirin (given underlying disease in his bypass grafts), carvedilol, atorvastatin, and valsartan.  No plans for ischemic evaluation at this time.  3. 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 fib certainly is at increased risk for volume overload.  He denies any symptoms of dyspnea or fluid retention at this time.  Continue current dose carvedilol, valsartan, and Lasix.  Financial constraints have previously precluded transition to [employer].  4. Frequent PVCs: Well controlled on mexiletine.  Check labs as above.  Follow-up with EP.  5. HTN: Blood pressure is reasonably controlled today.  Continue current medications as outlined above.  6. HLD: Most recent LDL of 30 from 02/2018.  He is not fasting at this time.  Remains on atorvastatin.  Recommend obtaining fasting lipids at next appointment.  7. AAA: Stable by most recent imaging in 10/2019.  Optimal BP and lipid control recommended.  Disposition: F/u with Dr. Fletcher Anon in 2 to 3 weeks, and EP at first available appointment.   Medication Adjustments/Labs and Tests Ordered: Current medicines are reviewed at length with the patient today.  Concerns regarding medicines are outlined above. Medication changes, Labs and Tests ordered today are summarized above and listed in the Patient Instructions accessible in Encounters.   SignedChristell Faith, PA-C 12/29/2019 3:19 PM     [facility] -  [address] [place], [place] [address] [phone]

## 2019-12-29 ENCOUNTER — Encounter: Payer: Self-pay | Admitting: Physician Assistant

## 2019-12-29 ENCOUNTER — Ambulatory Visit (INDEPENDENT_AMBULATORY_CARE_PROVIDER_SITE_OTHER): Payer: PPO | Admitting: Physician Assistant

## 2019-12-29 ENCOUNTER — Other Ambulatory Visit: Payer: Self-pay

## 2019-12-29 VITALS — BP 130/70 | HR 89 | Ht 72.0 in | Wt 269.0 lb

## 2019-12-29 DIAGNOSIS — I255 Ischemic cardiomyopathy: Secondary | ICD-10-CM

## 2019-12-29 DIAGNOSIS — I25810 Atherosclerosis of coronary artery bypass graft(s) without angina pectoris: Secondary | ICD-10-CM

## 2019-12-29 DIAGNOSIS — I10 Essential (primary) hypertension: Secondary | ICD-10-CM | POA: Diagnosis not present

## 2019-12-29 DIAGNOSIS — I428 Other cardiomyopathies: Secondary | ICD-10-CM

## 2019-12-29 DIAGNOSIS — I5022 Chronic systolic (congestive) heart failure: Secondary | ICD-10-CM

## 2019-12-29 DIAGNOSIS — E785 Hyperlipidemia, unspecified: Secondary | ICD-10-CM | POA: Diagnosis not present

## 2019-12-29 DIAGNOSIS — I714 Abdominal aortic aneurysm, without rupture, unspecified: Secondary | ICD-10-CM

## 2019-12-29 DIAGNOSIS — I493 Ventricular premature depolarization: Secondary | ICD-10-CM | POA: Diagnosis not present

## 2019-12-29 DIAGNOSIS — I4891 Unspecified atrial fibrillation: Secondary | ICD-10-CM | POA: Diagnosis not present

## 2019-12-29 MED ORDER — APIXABAN 5 MG PO TABS: 5.0000 mg | ORAL_TABLET | Freq: Two times a day (BID) | ORAL | 3 refills | Status: DC

## 2019-12-29 NOTE — Patient Instructions (Signed)
Medication Instructions:  1- START Eliquis Take 1 tablet (5 mg total) by mouth 2 (two) times daily *If you need a refill on your cardiac medications before your next appointment, please call your pharmacy*   Lab Work: Your physician recommends that you have lab work today(CBC, CMET, Mag, Tsh)  If you have labs (blood work) drawn today and your tests are completely normal, you will receive your results only by: [name] MyChart Message (if you have MyChart) OR . A paper copy in the mail If you have any lab test that is abnormal or we need to change your treatment, we will call you to review the results.   Testing/Procedures: None ordered   Follow-Up: At [facility], you and your health needs are our priority.  As part of our continuing mission to provide you with exceptional heart care, we have created designated Provider Care Teams.  These Care Teams include your primary Cardiologist (physician) and Advanced Practice Providers (APPs -  Physician Assistants and Nurse Practitioners) who all work together to provide you with the care you need, when you need it.  We recommend signing up for the patient portal called "MyChart".  Sign up information is provided on this After Visit Summary.  MyChart is used to connect with patients for Virtual Visits (Telemedicine).  Patients are able to view lab/test results, encounter notes, upcoming appointments, etc.  Non-urgent messages can be sent to your provider as well.   To learn more about what you can do with MyChart, go to [email or web address].    Your next appointment:   See appt   Other Instructions Follow up appt with Dr. Graciela Husbands 01/02/20 in [place] @ 3 pm

## 2019-12-30 ENCOUNTER — Telehealth: Payer: Self-pay

## 2019-12-30 LAB — COMPREHENSIVE METABOLIC PANEL
ALT: 16 IU/L (ref 0–44)
AST: 17 IU/L (ref 0–40)
Albumin/Globulin Ratio: 1.5 (ref 1.2–2.2)
Albumin: 4.1 g/dL (ref 3.7–4.7)
Alkaline Phosphatase: 119 IU/L — ABNORMAL HIGH (ref 39–117)
BUN/Creatinine Ratio: 14 (ref 10–24)
BUN: 17 mg/dL (ref 8–27)
Bilirubin Total: 0.5 mg/dL (ref 0.0–1.2)
CO2: 23 mmol/L (ref 20–29)
Calcium: 9.2 mg/dL (ref 8.6–10.2)
Chloride: 100 mmol/L (ref 96–106)
Creatinine, Ser: 1.22 mg/dL (ref 0.76–1.27)
GFR calc Af Amer: 66 mL/min/{1.73_m2} (ref >59)
GFR calc non Af Amer: 57 mL/min/{1.73_m2} — ABNORMAL LOW (ref >59)
Globulin, Total: 2.8 g/dL (ref 1.5–4.5)
Glucose: 103 mg/dL — ABNORMAL HIGH (ref 65–99)
Potassium: 4.4 mmol/L (ref 3.5–5.2)
Sodium: 137 mmol/L (ref 134–144)
Total Protein: 6.9 g/dL (ref 6.0–8.5)

## 2019-12-30 LAB — CBC
Hematocrit: 48.3 % (ref 37.5–51.0)
Hemoglobin: 15.8 g/dL (ref 13.0–17.7)
MCH: 29.1 pg (ref 26.6–33.0)
MCHC: 32.7 g/dL (ref 31.5–35.7)
MCV: 89 fL (ref 79–97)
Platelets: 203 10*3/uL (ref 150–450)
RBC: 5.43 x10E6/uL (ref 4.14–5.80)
RDW: 12.8 % (ref 11.6–15.4)
WBC: 8.9 10*3/uL (ref 3.4–10.8)

## 2019-12-30 LAB — MAGNESIUM: Magnesium: 2.1 mg/dL (ref 1.6–2.3)

## 2019-12-30 LAB — TSH: TSH: 2.58 u[IU]/mL (ref 0.450–4.500)

## 2019-12-30 NOTE — Telephone Encounter (Signed)
-----   Message from Sondra Barges, PA-C sent at 12/30/2019  7:29 AM EDT ----- Random glucose okay Kidney function normal Potassium at goal Liver function normal Blood count normal Magnesium at goal Thyroid function normal  No laboratory abnormalities to account for his newly diagnosed A. Fib Follow-up as directed

## 2019-12-30 NOTE — Telephone Encounter (Signed)
-----   Message from Ryan M Dunn, PA-C sent at 12/30/2019  7:29 AM EDT ----- Random glucose okay Kidney function normal Potassium at goal Liver function normal Blood count normal Magnesium at goal Thyroid function normal  No laboratory abnormalities to account for his newly diagnosed A. Fib Follow-up as directed 

## 2019-12-30 NOTE — Telephone Encounter (Signed)
Call to patient to review labs.    Spoke to [name] [name] who verbalized understanding and has no further questions at this time.    Advised pt to call for any further questions or concerns.  No further orders.

## 2020-01-01 DIAGNOSIS — I4891 Unspecified atrial fibrillation: Secondary | ICD-10-CM | POA: Insufficient documentation

## 2020-01-02 ENCOUNTER — Ambulatory Visit: Payer: PPO | Admitting: Internal Medicine

## 2020-01-03 DIAGNOSIS — I10 Essential (primary) hypertension: Secondary | ICD-10-CM | POA: Diagnosis not present

## 2020-01-03 DIAGNOSIS — N200 Calculus of kidney: Secondary | ICD-10-CM | POA: Diagnosis not present

## 2020-01-03 DIAGNOSIS — I714 Abdominal aortic aneurysm, without rupture: Secondary | ICD-10-CM | POA: Diagnosis not present

## 2020-01-03 DIAGNOSIS — I4891 Unspecified atrial fibrillation: Secondary | ICD-10-CM | POA: Diagnosis not present

## 2020-01-10 DIAGNOSIS — I255 Ischemic cardiomyopathy: Secondary | ICD-10-CM | POA: Diagnosis not present

## 2020-01-10 DIAGNOSIS — I10 Essential (primary) hypertension: Secondary | ICD-10-CM | POA: Diagnosis not present

## 2020-01-10 DIAGNOSIS — I714 Abdominal aortic aneurysm, without rupture: Secondary | ICD-10-CM | POA: Diagnosis not present

## 2020-01-10 DIAGNOSIS — E1159 Type 2 diabetes mellitus with other circulatory complications: Secondary | ICD-10-CM | POA: Diagnosis not present

## 2020-01-10 DIAGNOSIS — I4891 Unspecified atrial fibrillation: Secondary | ICD-10-CM | POA: Diagnosis not present

## 2020-01-10 DIAGNOSIS — I5022 Chronic systolic (congestive) heart failure: Secondary | ICD-10-CM | POA: Diagnosis not present

## 2020-01-19 ENCOUNTER — Ambulatory Visit: Payer: PPO | Admitting: Cardiovascular Disease

## 2020-01-19 ENCOUNTER — Other Ambulatory Visit: Payer: Self-pay

## 2020-01-19 ENCOUNTER — Encounter: Payer: Self-pay | Admitting: Cardiovascular Disease

## 2020-01-19 VITALS — BP 106/62 | HR 89 | Ht 69.0 in | Wt 271.0 lb

## 2020-01-19 DIAGNOSIS — I5022 Chronic systolic (congestive) heart failure: Secondary | ICD-10-CM | POA: Diagnosis not present

## 2020-01-19 DIAGNOSIS — I4891 Unspecified atrial fibrillation: Secondary | ICD-10-CM | POA: Diagnosis not present

## 2020-01-19 DIAGNOSIS — I10 Essential (primary) hypertension: Secondary | ICD-10-CM

## 2020-01-19 DIAGNOSIS — I2510 Atherosclerotic heart disease of native coronary artery without angina pectoris: Secondary | ICD-10-CM

## 2020-01-19 DIAGNOSIS — I493 Ventricular premature depolarization: Secondary | ICD-10-CM

## 2020-01-19 NOTE — Progress Notes (Signed)
Cardiology Office Note   Date:  01/19/2020   ID:  [name] [name], DOB 1941-08-07, MRN [id number]  PCP:  [name], MD  Cardiologist:   [name], MD   Chief Complaint  Patient presents with  . office visit    Pt has no complaints. Meds verbally reviewed w/ pt.       History of Present Illness: [name] [name] is a 79 y.o. male who presents for a follow-up visit regarding coronary artery disease, chronic systolic heart failure ,frequent PVCs and recently diagnosed atrial fibrillation. He has known history of CAD s/p 5 vessel CABG in 05/2014 in the setting of NSTEMI with acute systolic CHF mixedICMand NICM,  small AAA, ACEi-induced hyperkalemia, frequent PVCs, DM2 with diabetic neuropathy, HTN, HLD, and obesity.  Most recent cardiac cath in 04/2015 showed 3 of 5 patent grafts with new occlusions of the vein grafts to the OM1 and OM2. The native marginals were small and not felt to be amenable to PCI.   He had worsening LV systolic function to 30-35% in 04/2015 due to very frequent PVCs with Holter monitoring in 06/2015 demonstrating 28,000 PVCs and was placed on mexiletine therapy with significant improvement.   He could not afford Entresto due to cost.  Most recent echocardiogram in November 2018 showed an EF of 35 to 40%.  He was seen by [name] on May 6 and was noted to be in atrial fibrillation with controlled ventricular rate.  He was started on anticoagulation with Eliquis.  He reports worsening exertional dyspnea over the last few months without chest pain.   He has been taking his medications regularly and has not missed any dose of Eliquis.  He reports having gout in his right foot recently with significant swelling that subsequently improved.       Past Medical History:  Diagnosis Date  . AAA (abdominal aortic aneurysm) (HCC)   . ACE inhibitor associated hyperkalemia 09/20/2015  . Arthritis   . CAD (coronary artery disease)    a. 03/2014 NSTEMI-->  05/2014 CABG x 5 (LIMA->D1, VG->LAD, VG->OM1, VG->OM2, VG->RPDA);  c. 04/2015 Cath: LM nl, LAD 100ost, 40d, RI 80, LCX 80p/m, OM1 70, OM2 80, RCA 20p, 27m, 90d, VG->dLAD min irregs, VG->OM1 100, VG->OM2 100, VG->RPDA nl, LIMA->D2 nl.  . Chronic systolic CHF (congestive heart failure) (HCC)    a. EF 25-30% by 2D ECHO 04/20/14. Severely dec global hypokinesis. mildly elevated RVSP;  b. 12/2014 Echo: EF 40-45%, mod dil LA, mildly dil RV/RA;  c. 04/2015 Echo: EF 30-35%, sev antsept HK, Gr1 DD, mildly dil RA/LA;  d. 07/2015 Echo: EF 35-40%, ant/antsept HK, Gr1 DD, mildly red RV fxn.  . Diabetes mellitus without complication (HCC)   . Epigastric hernia   . Frequent PVCs    a. 24 hr Holter 06/2015: >28K PVCs accounting for 25% of all beats, rare PAC-->mexilitene started;  b. 07/2015 Holter: 1700 PVC's/24 hrs.  [name] GERD (gastroesophageal reflux disease)   . Gout   . Hyperlipidemia   . Hypertension   . Ischemic cardiomyopathy    a. EF 25-30% 03/2014;  b. EF 40-45% 12/2014.  . Mitral regurgitation    a. 03/2014 TEE: mild to mod MR.  . Nephrolithiasis   . Neuromuscular disorder (HCC)    DIABETIC NEUROPATHY  . Obesity   . Umbilical hernia     Past Surgical History:  Procedure Laterality Date  . CARDIAC CATHETERIZATION     [facility]  . CARDIAC CATHETERIZATION N/A 05/23/2015  Procedure: Left Heart Cath and Coronary Angiography;  Surgeon: Mitsuo Budnick A Nithya Meriweather, MD;  Location: [facility];  Service: Cardiovascular;  Laterality: N/A;  . CARPAL TUNNEL RELEASE Right   . CORONARY ARTERY BYPASS GRAFT N/A 06/06/2014   Procedure: CORONARY ARTERY BYPASS GRAFTING (CABG) x 5 USING LEFT AND RIGHT SAPHENOUS LEG VEIN HARVESTED ENDOSCOPICALLY;  Surgeon: Peter Van Trigt, MD;  Location: [facility];  Service: Open Heart Surgery;  Laterality: N/A;  . ESOPHAGOGASTRODUODENOSCOPY (EGD) WITH PROPOFOL N/A 02/11/2016   Procedure: ESOPHAGOGASTRODUODENOSCOPY (EGD) WITH PROPOFOL with dialation;  Surgeon: Darren Wohl, MD;  Location: [facility];  Service: Endoscopy;  Laterality: N/A;  diabetic, oral meds  . INTRAOPERATIVE TRANSESOPHAGEAL ECHOCARDIOGRAM N/A 06/06/2014   Procedure: INTRAOPERATIVE TRANSESOPHAGEAL ECHOCARDIOGRAM;  Surgeon: Peter Van Trigt, MD;  Location: [facility];  Service: Open Heart Surgery;  Laterality: N/A;  . ROTATOR CUFF REPAIR Right 1996,1997,2000  . TEE WITHOUT CARDIOVERSION N/A 04/24/2014   Procedure: TRANSESOPHAGEAL ECHOCARDIOGRAM (TEE);  Surgeon: Philip J Nahser, MD;  Location: [facility];  Service: Cardiovascular;  Laterality: N/A;     Current Outpatient Medications  Medication Sig Dispense Refill  . apixaban (ELIQUIS) 5 MG TABS tablet Take 1 tablet (5 mg total) by mouth 2 (two) times daily. 60 tablet 3  . aspirin EC 81 MG tablet Take 1 tablet (81 mg total) by mouth daily.    . atorvastatin (LIPITOR) 40 MG tablet TAKE 1 TABLET BY MOUTH ONCE DAILY AT 6 PM. 90 tablet 0  . carvedilol (COREG) 12.5 MG tablet TAKE 1 TABLET BY MOUTH TWICE DAILY WITH A MEAL 180 tablet 0  . clotrimazole-betamethasone (LOTRISONE) cream APPLY CREAM TO AFFECTED AREA TWICE DAILY    . colchicine 0.6 MG tablet Take 0.6 mg by mouth daily as needed (for gout flareup).     . diphenhydramine-acetaminophen (TYLENOL PM) 25-500 MG TABS tablet Take 1 tablet by mouth at bedtime as needed.    . furosemide (LASIX) 40 MG tablet Take 1 tablet by mouth once daily 90 tablet 0  . gabapentin (NEURONTIN) 300 MG capsule Take 300 mg by mouth 4 (four) times daily.     . mexiletine (MEXITIL) 150 MG capsule Take 2 capsules by mouth twice daily 360 capsule 0  . nitroGLYCERIN (NITROSTAT) 0.4 MG SL tablet Place 1 tablet (0.4 mg total) under the tongue every 5 (five) minutes as needed for chest pain. 25 tablet 3  . valsartan (DIOVAN) 40 MG tablet Take 1 tablet by mouth once daily 90 tablet 0   No current facility-administered medications for this visit.    Allergies:   Glimepiride    Social History:  The patient  reports that he has quit smoking.  His smoking use included cigarettes. He has a 25.00 pack-year smoking history. He has quit using smokeless tobacco.  His smokeless tobacco use included chew. He reports that he does not drink alcohol or use drugs.   Family History:  The patient's family history includes Colon polyps in his brother; Lung cancer in his brother; Stroke in his father; Throat cancer in his father.    ROS:  Please see the history of present illness.   Otherwise, review of systems are positive for none.   All other systems are reviewed and negative.    PHYSICAL EXAM: VS:  BP 106/62 (BP Location: Left Arm, Patient Position: Sitting, Cuff Size: Normal)   Pulse 89   Ht 5' 9" (1.753 m)   Wt 271 lb (122.9 kg)   SpO2 96%   BMI   40.02 kg/m  , BMI Body mass index is 40.02 kg/m. GEN: Well nourished, well developed, in no acute distress  HEENT: normal  Neck: no JVD, carotid bruits, or masses Cardiac: Irregularly irregular; no murmurs, rubs, or gallops.  Moderate right leg swelling Respiratory:  clear to auscultation bilaterally, normal work of breathing GI: soft, nontender, nondistended, + BS MS: no deformity or atrophy  Skin: warm and dry, no rash Neuro:  Strength and sensation are intact Psych: euthymic mood, full affect   EKG:  EKG is ordered today. EKG showed atrial fibrillation with a ventricular rate of 89 bpm.   Recent Labs: 12/29/2019: ALT 16; BUN 17; Creatinine, Ser 1.22; Hemoglobin 15.8; Magnesium 2.1; Platelets 203; Potassium 4.4; Sodium 137; TSH 2.580    Lipid Panel    Component Value Date/Time   CHOL 95 (L) 12/25/2014 1215   TRIG 129 12/25/2014 1215   HDL 25 (L) 12/25/2014 1215   CHOLHDL 3.8 12/25/2014 1215   CHOLHDL 3.8 04/24/2014 0407   VLDL 23 04/24/2014 0407   LDLCALC 44 12/25/2014 1215      Wt Readings from Last 3 Encounters:  01/19/20 271 lb (122.9 kg)  12/29/19 269 lb (122 kg)  09/30/19 258 lb (117 kg)       ASSESSMENT AND PLAN:  1.  Atrial fibrillation: This was recently  diagnosed.  His symptoms include worsening exertional dyspnea without significant palpitations.  He has been on anticoagulation with Eliquis without interruption.  I recommend proceeding with cardioversion.  I requested an echocardiogram to evaluate LV systolic function and ensure stability.  2. Chronic systolic heart failure: He seems to be euvolemic.  Continue carvedilol and valsartan.  The patient did not afford Entresto in the past.  3.   Coronary artery disease involving native coronary arteries: No anginal symptoms at the present time.  Continue medical therapy.  4.   Frequent PVCs: He seems to be asymptomatic.  Currently on mexiletine 150 mg twice daily.  No PVCs on EKG we will bring him back in 3 months for an office follow-up with EKG  5. Essential hypertension: Blood pressure has been well controlled.  6.   Hyperlipidemia: Continue atorvastatin.    7.   Small abdominal aortic aneurysm: He had ultrasound done in March which showed the aneurysm was 3 cm in diameter.  Repeat ultrasound in 2 to 3 years.   Disposition:   FU with me in 1 months  Signed,  Kathlyn Sacramento, MD  01/19/2020 3:37 PM    [place]

## 2020-01-19 NOTE — H&P (View-Only) (Signed)
Cardiology Office Note   Date:  01/19/2020   ID:  [name] [name], DOB 1941-08-07, MRN [id number]  PCP:  [name], MD  Cardiologist:   [name], MD   Chief Complaint  Patient presents with  . office visit    Pt has no complaints. Meds verbally reviewed w/ pt.       History of Present Illness: [name] [name] is a 79 y.o. male who presents for a follow-up visit regarding coronary artery disease, chronic systolic heart failure ,frequent PVCs and recently diagnosed atrial fibrillation. He has known history of CAD s/p 5 vessel CABG in 05/2014 in the setting of NSTEMI with acute systolic CHF mixedICMand NICM,  small AAA, ACEi-induced hyperkalemia, frequent PVCs, DM2 with diabetic neuropathy, HTN, HLD, and obesity.  Most recent cardiac cath in 04/2015 showed 3 of 5 patent grafts with new occlusions of the vein grafts to the OM1 and OM2. The native marginals were small and not felt to be amenable to PCI.   He had worsening LV systolic function to 30-35% in 04/2015 due to very frequent PVCs with Holter monitoring in 06/2015 demonstrating 28,000 PVCs and was placed on mexiletine therapy with significant improvement.   He could not afford Entresto due to cost.  Most recent echocardiogram in November 2018 showed an EF of 35 to 40%.  He was seen by [name] on May 6 and was noted to be in atrial fibrillation with controlled ventricular rate.  He was started on anticoagulation with Eliquis.  He reports worsening exertional dyspnea over the last few months without chest pain.   He has been taking his medications regularly and has not missed any dose of Eliquis.  He reports having gout in his right foot recently with significant swelling that subsequently improved.       Past Medical History:  Diagnosis Date  . AAA (abdominal aortic aneurysm) (HCC)   . ACE inhibitor associated hyperkalemia 09/20/2015  . Arthritis   . CAD (coronary artery disease)    a. 03/2014 NSTEMI-->  05/2014 CABG x 5 (LIMA->D1, VG->LAD, VG->OM1, VG->OM2, VG->RPDA);  c. 04/2015 Cath: LM nl, LAD 100ost, 40d, RI 80, LCX 80p/m, OM1 70, OM2 80, RCA 20p, 27m, 90d, VG->dLAD min irregs, VG->OM1 100, VG->OM2 100, VG->RPDA nl, LIMA->D2 nl.  . Chronic systolic CHF (congestive heart failure) (HCC)    a. EF 25-30% by 2D ECHO 04/20/14. Severely dec global hypokinesis. mildly elevated RVSP;  b. 12/2014 Echo: EF 40-45%, mod dil LA, mildly dil RV/RA;  c. 04/2015 Echo: EF 30-35%, sev antsept HK, Gr1 DD, mildly dil RA/LA;  d. 07/2015 Echo: EF 35-40%, ant/antsept HK, Gr1 DD, mildly red RV fxn.  . Diabetes mellitus without complication (HCC)   . Epigastric hernia   . Frequent PVCs    a. 24 hr Holter 06/2015: >28K PVCs accounting for 25% of all beats, rare PAC-->mexilitene started;  b. 07/2015 Holter: 1700 PVC's/24 hrs.  [name] GERD (gastroesophageal reflux disease)   . Gout   . Hyperlipidemia   . Hypertension   . Ischemic cardiomyopathy    a. EF 25-30% 03/2014;  b. EF 40-45% 12/2014.  . Mitral regurgitation    a. 03/2014 TEE: mild to mod MR.  . Nephrolithiasis   . Neuromuscular disorder (HCC)    DIABETIC NEUROPATHY  . Obesity   . Umbilical hernia     Past Surgical History:  Procedure Laterality Date  . CARDIAC CATHETERIZATION     [facility]  . CARDIAC CATHETERIZATION N/A 05/23/2015  Procedure: Left Heart Cath and Coronary Angiography;  Surgeon: Iran Ouch, MD;  [facility]: [facility];  Service: Cardiovascular;  Laterality: N/A;  . CARPAL TUNNEL RELEASE Right   . CORONARY ARTERY BYPASS GRAFT N/A 06/06/2014   Procedure: CORONARY ARTERY BYPASS GRAFTING (CABG) x 5 USING LEFT AND RIGHT SAPHENOUS LEG VEIN HARVESTED ENDOSCOPICALLY;  Surgeon: Kerin Perna, MD;  Location: [facility];  Service: Open Heart Surgery;  Laterality: N/A;  . ESOPHAGOGASTRODUODENOSCOPY (EGD) WITH PROPOFOL N/A 02/11/2016   Procedure: ESOPHAGOGASTRODUODENOSCOPY (EGD) WITH PROPOFOL with dialation;  Surgeon: [name], MD;  Location: [facility];  Service: Endoscopy;  Laterality: N/A;  diabetic, oral meds  . INTRAOPERATIVE TRANSESOPHAGEAL ECHOCARDIOGRAM N/A 06/06/2014   Procedure: INTRAOPERATIVE TRANSESOPHAGEAL ECHOCARDIOGRAM;  Surgeon: Kerin Perna, MD;  Location: [facility] OR;  Service: Open Heart Surgery;  Laterality: N/A;  . ROTATOR CUFF REPAIR Right 1996,1997,2000  . TEE WITHOUT CARDIOVERSION N/A 04/24/2014   Procedure: TRANSESOPHAGEAL ECHOCARDIOGRAM (TEE);  Surgeon: [name], MD;  Location: [facility];  Service: Cardiovascular;  Laterality: N/A;     Current Outpatient Medications  Medication Sig Dispense Refill  . apixaban (ELIQUIS) 5 MG TABS tablet Take 1 tablet (5 mg total) by mouth 2 (two) times daily. 60 tablet 3  . aspirin EC 81 MG tablet Take 1 tablet (81 mg total) by mouth daily.    [name] atorvastatin (LIPITOR) 40 MG tablet TAKE 1 TABLET BY MOUTH ONCE DAILY AT 6 PM. 90 tablet 0  . carvedilol (COREG) 12.5 MG tablet TAKE 1 TABLET BY MOUTH TWICE DAILY WITH A MEAL 180 tablet 0  . clotrimazole-betamethasone (LOTRISONE) cream APPLY CREAM TO AFFECTED AREA TWICE DAILY    . colchicine 0.6 MG tablet Take 0.6 mg by mouth daily as needed (for gout flareup).     . diphenhydramine-acetaminophen (TYLENOL PM) 25-500 MG TABS tablet Take 1 tablet by mouth at bedtime as needed.    . furosemide (LASIX) 40 MG tablet Take 1 tablet by mouth once daily 90 tablet 0  . gabapentin (NEURONTIN) 300 MG capsule Take 300 mg by mouth 4 (four) times daily.     [name] mexiletine (MEXITIL) 150 MG capsule Take 2 capsules by mouth twice daily 360 capsule 0  . nitroGLYCERIN (NITROSTAT) 0.4 MG SL tablet Place 1 tablet (0.4 mg total) under the tongue every 5 (five) minutes as needed for chest pain. 25 tablet 3  . valsartan (DIOVAN) 40 MG tablet Take 1 tablet by mouth once daily 90 tablet 0   No current facility-administered medications for this visit.    Allergies:   Glimepiride    Social History:  The patient  reports that he has quit smoking.  His smoking use included cigarettes. He has a 25.00 pack-year smoking history. He has quit using smokeless tobacco.  His smokeless tobacco use included chew. He reports that he does not drink alcohol or use drugs.   Family History:  The patient's family history includes Colon polyps in his brother; Lung cancer in his brother; Stroke in his father; Throat cancer in his father.    ROS:  Please see the history of present illness.   Otherwise, review of systems are positive for none.   All other systems are reviewed and negative.    PHYSICAL EXAM: VS:  BP 106/62 (BP Location: Left Arm, Patient Position: Sitting, Cuff Size: Normal)   Pulse 89   Ht 5\' 9"  (1.753 m)   Wt 271 lb (122.9 kg)   SpO2 96%   BMI  40.02 kg/m  , BMI Body mass index is 40.02 kg/m. GEN: Well nourished, well developed, in no acute distress  HEENT: normal  Neck: no JVD, carotid bruits, or masses Cardiac: Irregularly irregular; no murmurs, rubs, or gallops.  Moderate right leg swelling Respiratory:  clear to auscultation bilaterally, normal work of breathing GI: soft, nontender, nondistended, + BS MS: no deformity or atrophy  Skin: warm and dry, no rash Neuro:  Strength and sensation are intact Psych: euthymic mood, full affect   EKG:  EKG is ordered today. EKG showed atrial fibrillation with a ventricular rate of 89 bpm.   Recent Labs: 12/29/2019: ALT 16; BUN 17; Creatinine, Ser 1.22; Hemoglobin 15.8; Magnesium 2.1; Platelets 203; Potassium 4.4; Sodium 137; TSH 2.580    Lipid Panel    Component Value Date/Time   CHOL 95 (L) 12/25/2014 1215   TRIG 129 12/25/2014 1215   HDL 25 (L) 12/25/2014 1215   CHOLHDL 3.8 12/25/2014 1215   CHOLHDL 3.8 04/24/2014 0407   VLDL 23 04/24/2014 0407   LDLCALC 44 12/25/2014 1215      Wt Readings from Last 3 Encounters:  01/19/20 271 lb (122.9 kg)  12/29/19 269 lb (122 kg)  09/30/19 258 lb (117 kg)       ASSESSMENT AND PLAN:  1.  Atrial fibrillation: This was recently  diagnosed.  His symptoms include worsening exertional dyspnea without significant palpitations.  He has been on anticoagulation with Eliquis without interruption.  I recommend proceeding with cardioversion.  I requested an echocardiogram to evaluate LV systolic function and ensure stability.  2. Chronic systolic heart failure: He seems to be euvolemic.  Continue carvedilol and valsartan.  The patient did not afford Entresto in the past.  3.   Coronary artery disease involving native coronary arteries: No anginal symptoms at the present time.  Continue medical therapy.  4.   Frequent PVCs: He seems to be asymptomatic.  Currently on mexiletine 150 mg twice daily.  No PVCs on EKG we will bring him back in 3 months for an office follow-up with EKG  5. Essential hypertension: Blood pressure has been well controlled.  6.   Hyperlipidemia: Continue atorvastatin.    7.   Small abdominal aortic aneurysm: He had ultrasound done in March which showed the aneurysm was 3 cm in diameter.  Repeat ultrasound in 2 to 3 years.   Disposition:   FU with me in 1 months  Signed,  Kathlyn Sacramento, MD  01/19/2020 3:37 PM    [place]

## 2020-01-19 NOTE — Patient Instructions (Signed)
Medication Instructions:  1- STOP ASA *If you need a refill on your cardiac medications before your next appointment, please call your pharmacy*   Lab Work: 1- Your physician recommends that you have lab work today(CBC, BMET)  2- CV19 Pre admit testing DRIVE THRU  Please report to the PAT testing site ([facility]) on _______June 3__ date ______8-12 AM_____ time for your DRIVE THRU covid testing that is required prior to your procedure.  Following covid testing, please remain in quarantine. If you must be around others, please wash hands, avoid touching face and wear your mask.    If you have labs (blood work) drawn today and your tests are completely normal, you will receive your results only by: [name] MyChart Message (if you have MyChart) OR . A paper copy in the mail If you have any lab test that is abnormal or we need to change your treatment, we will call you to review the results.   Testing/Procedures: 1- Echo  Please return to [facility] on ______________ at _______________ AM/PM for an Echocardiogram. Your physician has requested that you have an echocardiogram. Echocardiography is a painless test that uses sound waves to create images of your heart. It provides your doctor with information about the size and shape of your heart and how well your heart's chambers and valves are working. This procedure takes approximately one hour. There are no restrictions for this procedure. Please note; depending on visual quality an IV may need to be placed.   2-  You are scheduled for a Cardioversion on June 7th with Dr. Fletcher Anon.  Please arrive at the [place] [facility] at 0630 am. (1 hour prior to procedure unless lab work is needed; if lab work is needed arrive 1.5 hours ahead)  DIET: Nothing to eat or drink after midnight except a sip of water with medications (see medication instructions below)  Medication Instructions: Hold (fluid pill/DM meds)  Lasix Take  Carvediolol  Continue your anticoagulant: Eliquis You will need to continue your anticoagulant after your procedure until you are told by your  Provider that it is safe to stop   Labs: Done today  You must have a responsible person to drive you home and stay in the waiting area during your procedure. Failure to do so could result in cancellation.  Bring your insurance cards.  *Special Note: Every effort is made to have your procedure done on time. Occasionally there are emergencies that occur at the hospital that may cause delays. Please be patient if a delay does occur.      Follow-Up: At [facility], you and your health needs are our priority.  As part of our continuing mission to provide you with exceptional heart care, we have created designated Provider Care Teams.  These Care Teams include your primary Cardiologist (physician) and Advanced Practice Providers (APPs -  Physician Assistants and Nurse Practitioners) who all work together to provide you with the care you need, when you need it.  We recommend signing up for the patient portal called "MyChart".  Sign up information is provided on this After Visit Summary.  MyChart is used to connect with patients for Virtual Visits (Telemedicine).  Patients are able to view lab/test results, encounter notes, upcoming appointments, etc.  Non-urgent messages can be sent to your provider as well.   To learn more about what you can do with MyChart, go to [email or web address].    Your next appointment:   1 month(s)  The format  for your next appointment:   In Person  Provider:    You may see [name], MD or one of the following Advanced Practice Providers on your designated Care Team:    Nicolasa Ducking, NP  Eula Listen, PA-C  Marisue Ivan, PA-C

## 2020-01-20 LAB — CBC
Hematocrit: 45.1 % (ref 37.5–51.0)
Hemoglobin: 14.6 g/dL (ref 13.0–17.7)
MCH: 29.3 pg (ref 26.6–33.0)
MCHC: 32.4 g/dL (ref 31.5–35.7)
MCV: 91 fL (ref 79–97)
Platelets: 245 10*3/uL (ref 150–450)
RBC: 4.98 x10E6/uL (ref 4.14–5.80)
RDW: 13 % (ref 11.6–15.4)
WBC: 8.9 10*3/uL (ref 3.4–10.8)

## 2020-01-20 LAB — BASIC METABOLIC PANEL
BUN/Creatinine Ratio: 14 (ref 10–24)
BUN: 18 mg/dL (ref 8–27)
CO2: 23 mmol/L (ref 20–29)
Calcium: 9.4 mg/dL (ref 8.6–10.2)
Chloride: 103 mmol/L (ref 96–106)
Creatinine, Ser: 1.26 mg/dL (ref 0.76–1.27)
GFR calc Af Amer: 64 mL/min/{1.73_m2} (ref >59)
GFR calc non Af Amer: 55 mL/min/{1.73_m2} — ABNORMAL LOW (ref >59)
Glucose: 100 mg/dL — ABNORMAL HIGH (ref 65–99)
Potassium: 4.4 mmol/L (ref 3.5–5.2)
Sodium: 141 mmol/L (ref 134–144)

## 2020-01-26 ENCOUNTER — Other Ambulatory Visit
Admission: RE | Admit: 2020-01-26 | Discharge: 2020-01-26 | Disposition: A | Discharge status: Home / Self Care | Payer: PPO | Source: Ambulatory Visit | Attending: Cardiovascular Disease | Admitting: Cardiovascular Disease

## 2020-01-26 ENCOUNTER — Other Ambulatory Visit: Payer: Self-pay

## 2020-01-26 DIAGNOSIS — Z01812 Encounter for preprocedural laboratory examination: Secondary | ICD-10-CM | POA: Diagnosis not present

## 2020-01-26 DIAGNOSIS — Z20822 Contact with and (suspected) exposure to covid-19: Secondary | ICD-10-CM | POA: Insufficient documentation

## 2020-01-27 LAB — SARS CORONAVIRUS 2 (TAT 6-24 HRS): SARS Coronavirus 2: NEGATIVE

## 2020-01-30 ENCOUNTER — Ambulatory Visit
Admission: RE | Admit: 2020-01-30 | Discharge: 2020-01-30 | Disposition: A | Discharge status: Home / Self Care | Payer: PPO | Attending: Cardiovascular Disease | Admitting: Cardiovascular Disease

## 2020-01-30 ENCOUNTER — Ambulatory Visit: Payer: PPO | Admitting: Registered Nurse

## 2020-01-30 ENCOUNTER — Other Ambulatory Visit: Payer: Self-pay

## 2020-01-30 ENCOUNTER — Encounter
Admission: RE | Disposition: A | Discharge status: Home / Self Care | Payer: Self-pay | Source: Home / Self Care | Attending: Cardiovascular Disease

## 2020-01-30 DIAGNOSIS — I110 Hypertensive heart disease with heart failure: Secondary | ICD-10-CM | POA: Diagnosis not present

## 2020-01-30 DIAGNOSIS — Z79899 Other long term (current) drug therapy: Secondary | ICD-10-CM | POA: Insufficient documentation

## 2020-01-30 DIAGNOSIS — I2510 Atherosclerotic heart disease of native coronary artery without angina pectoris: Secondary | ICD-10-CM | POA: Insufficient documentation

## 2020-01-30 DIAGNOSIS — I340 Nonrheumatic mitral (valve) insufficiency: Secondary | ICD-10-CM | POA: Insufficient documentation

## 2020-01-30 DIAGNOSIS — I255 Ischemic cardiomyopathy: Secondary | ICD-10-CM | POA: Insufficient documentation

## 2020-01-30 DIAGNOSIS — Z951 Presence of aortocoronary bypass graft: Secondary | ICD-10-CM | POA: Diagnosis not present

## 2020-01-30 DIAGNOSIS — Z87891 Personal history of nicotine dependence: Secondary | ICD-10-CM | POA: Insufficient documentation

## 2020-01-30 DIAGNOSIS — E785 Hyperlipidemia, unspecified: Secondary | ICD-10-CM | POA: Diagnosis not present

## 2020-01-30 DIAGNOSIS — E669 Obesity, unspecified: Secondary | ICD-10-CM | POA: Diagnosis not present

## 2020-01-30 DIAGNOSIS — E1140 Type 2 diabetes mellitus with diabetic neuropathy, unspecified: Secondary | ICD-10-CM | POA: Insufficient documentation

## 2020-01-30 DIAGNOSIS — I714 Abdominal aortic aneurysm, without rupture: Secondary | ICD-10-CM | POA: Diagnosis not present

## 2020-01-30 DIAGNOSIS — I4819 Other persistent atrial fibrillation: Secondary | ICD-10-CM

## 2020-01-30 DIAGNOSIS — Z6841 Body Mass Index (BMI) 40.0 and over, adult: Secondary | ICD-10-CM | POA: Diagnosis not present

## 2020-01-30 DIAGNOSIS — I252 Old myocardial infarction: Secondary | ICD-10-CM | POA: Diagnosis not present

## 2020-01-30 DIAGNOSIS — I4891 Unspecified atrial fibrillation: Secondary | ICD-10-CM | POA: Diagnosis not present

## 2020-01-30 DIAGNOSIS — Z7901 Long term (current) use of anticoagulants: Secondary | ICD-10-CM | POA: Diagnosis not present

## 2020-01-30 DIAGNOSIS — K219 Gastro-esophageal reflux disease without esophagitis: Secondary | ICD-10-CM | POA: Diagnosis not present

## 2020-01-30 DIAGNOSIS — I5023 Acute on chronic systolic (congestive) heart failure: Secondary | ICD-10-CM | POA: Insufficient documentation

## 2020-01-30 DIAGNOSIS — M109 Gout, unspecified: Secondary | ICD-10-CM | POA: Insufficient documentation

## 2020-01-30 DIAGNOSIS — Z7982 Long term (current) use of aspirin: Secondary | ICD-10-CM | POA: Diagnosis not present

## 2020-01-30 HISTORY — PX: CARDIOVERSION: SHX1299

## 2020-01-30 SURGERY — CARDIOVERSION
Anesthesia: General

## 2020-01-30 MED ORDER — SODIUM CHLORIDE 0.9 % IV SOLN
INTRAVENOUS | Status: DC
  Administered 2020-01-30: 1000 mL via INTRAVENOUS

## 2020-01-30 MED ORDER — PROPOFOL 10 MG/ML IV BOLUS
INTRAVENOUS | Status: AC
  Filled 2020-01-30: qty 20

## 2020-01-30 MED ORDER — PROPOFOL 10 MG/ML IV BOLUS
INTRAVENOUS | Status: DC | PRN
  Administered 2020-01-30: 70 mg via INTRAVENOUS

## 2020-01-30 NOTE — CV Procedure (Signed)
Cardioversion note: A standard informed consent was obtained. Timeout was performed. The pads were placed in the anterior posterior fashion. The patient was given propofol by the anesthesia team.  Successful cardioversion was performed with a 200 J. The patient converted to sinus rhythm. Pre-and post EKGs were reviewed. The patient tolerated the procedure with no immediate complications.  Recommendations: Continue same medications and follow-up in 2-3 weeks.  

## 2020-01-30 NOTE — Anesthesia Procedure Notes (Signed)
Performed by: Calieb Lichtman, CRNA Pre-anesthesia Checklist: Patient identified, Emergency Drugs available, Suction available and Patient being monitored Patient Re-evaluated:Patient Re-evaluated prior to induction Oxygen Delivery Method: Nasal cannula Induction Type: IV induction Dental Injury: Teeth and Oropharynx as per pre-operative assessment  Comments: Nasal cannula with etCO2 monitoring       

## 2020-01-30 NOTE — Transfer of Care (Signed)
Immediate Anesthesia Transfer of Care Note  Patient: [name] [name]  Procedure(s) Performed: Procedure(s): CARDIOVERSION (N/A)  Patient Location: PACU and Short Stay  Anesthesia Type:General  Level of Consciousness: awake, alert  and oriented  Airway & Oxygen Therapy: Patient Spontanous Breathing and Patient connected to nasal cannula oxygen  Post-op Assessment: Report given to RN and Post -op Vital signs reviewed and stable  Post vital signs: Reviewed and stable  Last Vitals:  Vitals:   01/30/20 0754 01/30/20 0755  BP:  118/63  Pulse: (!) 57   Resp: 12 (!) 26  Temp:    SpO2: 96% 98%    Complications: No apparent anesthesia complications

## 2020-01-30 NOTE — Anesthesia Preprocedure Evaluation (Signed)
Anesthesia Evaluation  Patient identified by MRN, date of birth, ID band Patient awake    Reviewed: Allergy & Precautions, H&P , NPO status , Patient's Chart, lab work & pertinent test results, reviewed documented beta blocker date and time   Airway Mallampati: II   Neck ROM: full    Dental  (+) Poor Dentition   Pulmonary neg pulmonary ROS, former smoker,    Pulmonary exam normal        Cardiovascular Exercise Tolerance: Poor hypertension, On Medications + angina with exertion + CAD, +CHF and + DOE  (-) Orthopnea and (-) PND Normal cardiovascular exam Rhythm:regular Rate:Normal     Neuro/Psych  Neuromuscular disease negative psych ROS   GI/Hepatic Neg liver ROS, GERD  Medicated,  Endo/Other  diabetes, Well Controlled, Type 2Morbid obesity  Renal/GU Renal disease  negative genitourinary   Musculoskeletal   Abdominal   Peds  Hematology negative hematology ROS (+)   Anesthesia Other Findings Past Medical History: No date: AAA (abdominal aortic aneurysm) ([place]) 09/20/2015: ACE inhibitor associated hyperkalemia No date: Arthritis No date: CAD (coronary artery disease)     Comment:  a. 03/2014 NSTEMI--> 05/2014 CABG x 5 (LIMA->D1, VG->LAD,              VG->OM1, VG->OM2, VG->RPDA);  c. 04/2015 Cath: LM nl, LAD               100ost, 40d, RI 80, LCX 80p/m, OM1 70, OM2 80, RCA 20p,               68m, 90d, VG->dLAD min irregs, VG->OM1 100, VG->OM2 100,               VG->RPDA nl, LIMA->D2 nl. No date: Chronic systolic CHF (congestive heart failure) (HCC)     Comment:  a. EF 25-30% by 2D ECHO 04/20/14. Severely dec global               hypokinesis. mildly elevated RVSP;  b. 12/2014 Echo: EF               40-45%, mod dil LA, mildly dil RV/RA;  c. 04/2015 Echo: EF              30-35%, sev antsept HK, Gr1 DD, mildly dil RA/LA;  d.               07/2015 Echo: EF 35-40%, ant/antsept HK, Gr1 DD, mildly               red RV fxn. No  date: Diabetes mellitus without complication (HCC) No date: Epigastric hernia No date: Frequent PVCs     Comment:  a. 24 hr Holter 06/2015: >28K PVCs accounting for 25% of              all beats, rare PAC-->mexilitene started;  b. 07/2015               Holter: 1700 PVC's/24 hrs. No date: GERD (gastroesophageal reflux disease) No date: Gout No date: Hyperlipidemia No date: Hypertension No date: Ischemic cardiomyopathy     Comment:  a. EF 25-30% 03/2014;  b. EF 40-45% 12/2014. No date: Mitral regurgitation     Comment:  a. 03/2014 TEE: mild to mod MR. No date: Nephrolithiasis No date: Neuromuscular disorder ([place])     Comment:  DIABETIC NEUROPATHY No date: Obesity No date: Umbilical hernia Past Surgical History: No date: CARDIAC CATHETERIZATION     Comment:  [place] 05/23/2015: CARDIAC CATHETERIZATION; N/A     Comment:  Procedure: Left Heart Cath and Coronary Angiography;                Surgeon: Iran Ouch, MD;  [facility]: [facility]               [facility];  Service: Cardiovascular;  Laterality: N/A; No date: CARPAL TUNNEL RELEASE; Right 06/06/2014: CORONARY ARTERY BYPASS GRAFT; N/A     Comment:  Procedure: CORONARY ARTERY BYPASS GRAFTING (CABG) x 5               USING LEFT AND RIGHT SAPHENOUS LEG VEIN HARVESTED               ENDOSCOPICALLY;  Surgeon: Kerin Perna, MD;  Location:              [facility] OR;  Service: Open Heart Surgery;  Laterality: N/A; 02/11/2016: ESOPHAGOGASTRODUODENOSCOPY (EGD) WITH PROPOFOL; N/A     Comment:  Procedure: ESOPHAGOGASTRODUODENOSCOPY (EGD) WITH               PROPOFOL with dialation;  Surgeon: [name], MD;                Location: [facility];  Service: Endoscopy;                Laterality: N/A;  diabetic, oral meds 06/06/2014: INTRAOPERATIVE TRANSESOPHAGEAL ECHOCARDIOGRAM; N/A     Comment:  Procedure: INTRAOPERATIVE TRANSESOPHAGEAL               ECHOCARDIOGRAM;  Surgeon: Kerin Perna, MD;  Location:              [facility] OR;  Service: Open Heart  Surgery;  Laterality: N/A; [id number]: ROTATOR CUFF REPAIR; Right 04/24/2014: TEE WITHOUT CARDIOVERSION; N/A     Comment:  Procedure: TRANSESOPHAGEAL ECHOCARDIOGRAM (TEE);                Surgeon: [name], MD;  Location: [facility] ENDOSCOPY;                Service: Cardiovascular;  Laterality: N/A;   Reproductive/Obstetrics negative OB ROS                             Anesthesia Physical Anesthesia Plan  ASA: IV  Anesthesia Plan: General   Post-op Pain Management:    Induction:   PONV Risk Score and Plan:   Airway Management Planned:   Additional Equipment:   Intra-op Plan:   Post-operative Plan:   Informed Consent: I have reviewed the patients History and Physical, chart, labs and discussed the procedure including the risks, benefits and alternatives for the proposed anesthesia with the patient or authorized representative who has indicated his/her understanding and acceptance.     Dental Advisory Given  Plan Discussed with: CRNA  Anesthesia Plan Comments:         Anesthesia Quick Evaluation

## 2020-01-30 NOTE — Interval H&P Note (Signed)
History and Physical Interval Note:  01/30/2020 8:08 AM  [name] [name]  has presented today for surgery, with the diagnosis of Cardioversion   Afib.  The various methods of treatment have been discussed with the patient and family. After consideration of risks, benefits and other options for treatment, the patient has consented to  Procedure(s): CARDIOVERSION (N/A) as a surgical intervention.  The patient's history has been reviewed, patient examined, no change in status, stable for surgery.  I have reviewed the patient's chart and labs.  Questions were answered to the patient's satisfaction.     [name]

## 2020-01-31 NOTE — Anesthesia Postprocedure Evaluation (Signed)
Anesthesia Post Note  Patient: [name] [name]  Procedure(s) Performed: CARDIOVERSION (N/A )  Patient location during evaluation: PACU Anesthesia Type: General Level of consciousness: awake and alert Pain management: pain level controlled Vital Signs Assessment: post-procedure vital signs reviewed and stable Respiratory status: spontaneous breathing, nonlabored ventilation, respiratory function stable and patient connected to nasal cannula oxygen Cardiovascular status: blood pressure returned to baseline and stable Postop Assessment: no apparent nausea or vomiting Anesthetic complications: no     Last Vitals:  Vitals:   01/30/20 0815 01/30/20 0832  BP: 126/68 112/61  Pulse: 63 (!) 31  Resp: 12 16  Temp:    SpO2: 94% 99%    Last Pain:  Vitals:   01/30/20 0832  PainSc: 0-No pain                 Yevette Edwards

## 2020-02-02 DIAGNOSIS — E1159 Type 2 diabetes mellitus with other circulatory complications: Secondary | ICD-10-CM | POA: Diagnosis not present

## 2020-02-02 DIAGNOSIS — R972 Elevated prostate specific antigen [PSA]: Secondary | ICD-10-CM | POA: Diagnosis not present

## 2020-02-06 DIAGNOSIS — I10 Essential (primary) hypertension: Secondary | ICD-10-CM | POA: Diagnosis not present

## 2020-02-06 DIAGNOSIS — E1159 Type 2 diabetes mellitus with other circulatory complications: Secondary | ICD-10-CM | POA: Diagnosis not present

## 2020-02-06 DIAGNOSIS — R972 Elevated prostate specific antigen [PSA]: Secondary | ICD-10-CM | POA: Diagnosis not present

## 2020-02-06 DIAGNOSIS — I4891 Unspecified atrial fibrillation: Secondary | ICD-10-CM | POA: Diagnosis not present

## 2020-02-07 ENCOUNTER — Other Ambulatory Visit: Payer: Self-pay | Admitting: *Deleted

## 2020-02-07 MED ORDER — FUROSEMIDE 40 MG PO TABS: 40.0000 mg | ORAL_TABLET | Freq: Every day | ORAL | 2 refills | Status: DC

## 2020-02-08 ENCOUNTER — Ambulatory Visit (INDEPENDENT_AMBULATORY_CARE_PROVIDER_SITE_OTHER): Payer: PPO

## 2020-02-08 DIAGNOSIS — I4891 Unspecified atrial fibrillation: Secondary | ICD-10-CM

## 2020-02-08 MED ORDER — PERFLUTREN LIPID MICROSPHERE
1.0000 mL | INTRAVENOUS | Status: AC | PRN
  Administered 2020-02-08: 2 mL via INTRAVENOUS

## 2020-02-09 ENCOUNTER — Other Ambulatory Visit: Payer: Self-pay | Admitting: Cardiovascular Disease

## 2020-02-14 ENCOUNTER — Other Ambulatory Visit: Payer: Self-pay | Admitting: Cardiovascular Disease

## 2020-02-22 ENCOUNTER — Other Ambulatory Visit: Payer: Self-pay

## 2020-02-22 ENCOUNTER — Encounter: Payer: Self-pay | Admitting: Family

## 2020-02-22 ENCOUNTER — Ambulatory Visit (INDEPENDENT_AMBULATORY_CARE_PROVIDER_SITE_OTHER): Payer: PPO | Admitting: Family

## 2020-02-22 VITALS — BP 100/52 | HR 65 | Ht 69.0 in | Wt 270.1 lb

## 2020-02-22 DIAGNOSIS — I480 Paroxysmal atrial fibrillation: Secondary | ICD-10-CM | POA: Diagnosis not present

## 2020-02-22 DIAGNOSIS — I5022 Chronic systolic (congestive) heart failure: Secondary | ICD-10-CM | POA: Diagnosis not present

## 2020-02-22 DIAGNOSIS — Z7901 Long term (current) use of anticoagulants: Secondary | ICD-10-CM | POA: Diagnosis not present

## 2020-02-22 DIAGNOSIS — I25118 Atherosclerotic heart disease of native coronary artery with other forms of angina pectoris: Secondary | ICD-10-CM

## 2020-02-22 NOTE — Progress Notes (Signed)
Office Visit    Patient Name: [name] [name] Date of Encounter: 02/22/2020  Primary Care Provider:  [name], MD Primary Cardiologist:  [name], MD Electrophysiologist:  Sherryl Manges, MD   Chief Complaint    [name] [name] is a 79 y.o. male with a hx of CAD, chronic systolic heart failure, frequent PVC, atrial fibrillation presents today for follow-up after cardioversion  Past Medical History    Past Medical History:  Diagnosis Date  . AAA (abdominal aortic aneurysm) (HCC)   . ACE inhibitor associated hyperkalemia 09/20/2015  . Arthritis   . CAD (coronary artery disease)    a. 03/2014 NSTEMI--> 05/2014 CABG x 5 (LIMA->D1, VG->LAD, VG->OM1, VG->OM2, VG->RPDA);  c. 04/2015 Cath: LM nl, LAD 100ost, 40d, RI 80, LCX 80p/m, OM1 70, OM2 80, RCA 20p, 59m, 90d, VG->dLAD min irregs, VG->OM1 100, VG->OM2 100, VG->RPDA nl, LIMA->D2 nl.  . Chronic systolic CHF (congestive heart failure) (HCC)    a. EF 25-30% by 2D ECHO 04/20/14. Severely dec global hypokinesis. mildly elevated RVSP;  b. 12/2014 Echo: EF 40-45%, mod dil LA, mildly dil RV/RA;  c. 04/2015 Echo: EF 30-35%, sev antsept HK, Gr1 DD, mildly dil RA/LA;  d. 07/2015 Echo: EF 35-40%, ant/antsept HK, Gr1 DD, mildly red RV fxn.  . Diabetes mellitus without complication (HCC)   . Epigastric hernia   . Frequent PVCs    a. 24 hr Holter 06/2015: >28K PVCs accounting for 25% of all beats, rare PAC-->mexilitene started;  b. 07/2015 Holter: 1700 PVC's/24 hrs.  [name] GERD (gastroesophageal reflux disease)   . Gout   . Hyperlipidemia   . Hypertension   . Ischemic cardiomyopathy    a. EF 25-30% 03/2014;  b. EF 40-45% 12/2014.  . Mitral regurgitation    a. 03/2014 TEE: mild to mod MR.  . Nephrolithiasis   . Neuromuscular disorder (HCC)    DIABETIC NEUROPATHY  . Obesity   . Umbilical hernia    Past Surgical History:  Procedure Laterality Date  . CARDIAC CATHETERIZATION     [facility]  . CARDIAC CATHETERIZATION N/A 05/23/2015    Procedure: Left Heart Cath and Coronary Angiography;  Surgeon: Iran Ouch, MD;  Location: [facility];  Service: Cardiovascular;  Laterality: N/A;  . CARDIOVERSION N/A 01/30/2020   Procedure: CARDIOVERSION;  Surgeon: Iran Ouch, MD;  Location: [facility] ORS;  Service: Cardiovascular;  Laterality: N/A;  . CARPAL TUNNEL RELEASE Right   . CORONARY ARTERY BYPASS GRAFT N/A 06/06/2014   Procedure: CORONARY ARTERY BYPASS GRAFTING (CABG) x 5 USING LEFT AND RIGHT SAPHENOUS LEG VEIN HARVESTED ENDOSCOPICALLY;  Surgeon: Kerin Perna, MD;  Location: [facility] OR;  Service: Open Heart Surgery;  Laterality: N/A;  . ESOPHAGOGASTRODUODENOSCOPY (EGD) WITH PROPOFOL N/A 02/11/2016   Procedure: ESOPHAGOGASTRODUODENOSCOPY (EGD) WITH PROPOFOL with dialation;  Surgeon: [name], MD;  Location: [facility];  Service: Endoscopy;  Laterality: N/A;  diabetic, oral meds  . INTRAOPERATIVE TRANSESOPHAGEAL ECHOCARDIOGRAM N/A 06/06/2014   Procedure: INTRAOPERATIVE TRANSESOPHAGEAL ECHOCARDIOGRAM;  Surgeon: Kerin Perna, MD;  Location: [facility] OR;  Service: Open Heart Surgery;  Laterality: N/A;  . ROTATOR CUFF REPAIR Right 1996,1997,2000  . TEE WITHOUT CARDIOVERSION N/A 04/24/2014   Procedure: TRANSESOPHAGEAL ECHOCARDIOGRAM (TEE);  Surgeon: [name], MD;  Location: [facility] ENDOSCOPY;  Service: Cardiovascular;  Laterality: N/A;   Allergies  Allergies  Allergen Reactions  . Glimepiride     Unusual sweating and irregular heart beat    History of Present Illness    [name]  TORIN [name] is a 79 y.o. male with a hx of CAD, chronic systolic heart failure, frequent PVC, atrial fibrillation, small AAA, DM2, neuropathy, HTN, HLD last seen for cardioversion 01/30/2020.  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  He has a history of ACE induced hyperkalemia and this was subsequently been discontinued.  September 2016 he had worsening LVEF 30-35% in the setting of  frequent PVCs with Holter monitoring and was placed on mexiletine therapy with significant improvement.  He has been previously unable to afford Entresto due to cost.  Echo November 2018 with LVEF 35 to 40%.    Seen 12/29/2019 by Eula Listen, PA and noted to be in new onset atrial fibrillation with controlled rate,  started on Eliquis.  Consent in clinic 5/27/known by Dr. Goessel Sink and reported worsening exertional dyspnea without chest pain.  He was recommended for cardioversion and echocardiogram.  Cardioversion performed 01/30/20.  Echocardiogram 02/08/2020 LVEF 35-40%, RV systolic function low normal, RV normal size, LA mildly dilated, no significant valvular abnormality.  Reports improvement in DOE and fatigue since cardioversion.  He does endorse that he is having intermittent diarrhea and gas which he attributes to Eliquis.  Discussed that this would be a very uncommon side effect of the Eliquis.  We reviewed the typical side effect profile of Eliquis including hematuria, melena, bleeding.  He does endorse that it could have been the broccoli he ate yesterday.  Has been mowing his yard for exercise. Does note he takes breaks as he has arthritis in his back and hips which causes him some pain.   Reports no palpitations, near-syncope, syncope.   Eliquis costs him $45 per month which is reasonable for him.  EKGs/Labs/Other Studies Reviewed:   The following studies were reviewed today:  Echo 02/08/20  1. Left ventricular ejection fraction, by estimation, is 35 to 40%. The  left ventricle has moderately decreased function. The left ventricle  demonstrates global hypokinesis. Left ventricular diastolic function could  not be evaluated.   2. Right ventricular systolic function is low normal. The right  ventricular size is normal.   3. Left atrial size was mildly dilated.   4. The mitral valve is normal in structure. No evidence of mitral valve  regurgitation.   5. The aortic valve is grossly normal.  Aortic valve regurgitation is not  visualized.   EKG:  EKG is ordered today.  The ekg ordered today demonstrates sinus and 65 bpm with sinus arrhythmia and first-degree AV block PR 222, possible prior inferior infarct with nonspecific ST/T wave changes.  Recent Labs: 12/29/2019: ALT 16; Magnesium 2.1; TSH 2.580 01/19/2020: BUN 18; Creatinine, Ser 1.26; Hemoglobin 14.6; Platelets 245; Potassium 4.4; Sodium 141  Recent Lipid Panel    Component Value Date/Time   CHOL 95 (L) 12/25/2014 1215   TRIG 129 12/25/2014 1215   HDL 25 (L) 12/25/2014 1215   CHOLHDL 3.8 12/25/2014 1215   CHOLHDL 3.8 04/24/2014 0407   VLDL 23 04/24/2014 0407   LDLCALC 44 12/25/2014 1215    Home Medications   Current Meds  Medication Sig  . acetaminophen (TYLENOL) 500 MG tablet Take 500-1,000 mg by mouth every 6 (six) hours as needed (for pain.).  [name] apixaban (ELIQUIS) 5 MG TABS tablet Take 1 tablet (5 mg total) by mouth 2 (two) times daily.  [name] aspirin EC 81 MG tablet Take 1 tablet (81 mg total) by mouth daily.  [name] atorvastatin (LIPITOR)  40 MG tablet TAKE 1 TABLET BY MOUTH ONCE DAILY AT 6 PM. (Patient taking differently: Take 40 mg by mouth daily. )  . carvedilol (COREG) 12.5 MG tablet TAKE 1 TABLET BY MOUTH TWICE DAILY WITH A MEAL  . clotrimazole-betamethasone (LOTRISONE) cream Apply 1 application topically 2 (two) times daily as needed (skin irritation/breakouts).   . colchicine 0.6 MG tablet Take 0.6 mg by mouth daily as needed (for gout flareup).   . diphenhydramine-acetaminophen (TYLENOL PM) 25-500 MG TABS tablet Take 1 tablet by mouth at bedtime as needed (sleep).   . furosemide (LASIX) 40 MG tablet Take 1 tablet by mouth once daily  . gabapentin (NEURONTIN) 300 MG capsule Take 300 mg by mouth 4 (four) times daily.   [name] HYDROcodone-acetaminophen (NORCO/VICODIN) 5-325 MG tablet Take 1 tablet by mouth every 6 (six) hours as needed for moderate pain.  [name] mexiletine (MEXITIL) 150 MG capsule Take 2 capsules by mouth  twice daily (Patient taking differently: Take 150 mg by mouth in the morning, at noon, in the evening, and at bedtime. )  . nitroGLYCERIN (NITROSTAT) 0.4 MG SL tablet Place 1 tablet (0.4 mg total) under the tongue every 5 (five) minutes as needed for chest pain.  . valsartan (DIOVAN) 40 MG tablet Take 1 tablet by mouth once daily      Review of Systems   Review of Systems  Constitutional: Negative for chills, fever and malaise/fatigue.  Cardiovascular: Negative for chest pain, dyspnea on exertion, irregular heartbeat, leg swelling, near-syncope, orthopnea, palpitations and syncope.  Respiratory: Negative for cough, shortness of breath and wheezing.   Gastrointestinal: Negative for melena, nausea and vomiting.  Genitourinary: Negative for hematuria.  Neurological: Negative for dizziness, light-headedness and weakness.   All other systems reviewed and are otherwise negative except as noted above.  Physical Exam    VS:  BP (!) 100/52 (BP Location: Left Arm, Patient Position: Sitting, Cuff Size: Large)   Pulse 65   Ht 5\' 9"  (1.753 m)   Wt 270 lb 2 oz (122.5 kg)   SpO2 98%   BMI 39.89 kg/m  , BMI Body mass index is 39.89 kg/m. GEN: Well nourished, overweight, well developed, in no acute distress. HEENT: normal. Neck: Supple, no JVD, carotid bruits, or masses. Cardiac: RRR, no murmurs, rubs, or gallops. No clubbing, cyanosis, edema.  Radials/DP/PT 2+ and equal bilaterally.  Respiratory:  Respirations regular and unlabored, clear to auscultation bilaterally. GI: Soft, nontender, nondistended, BS + x 4. MS: No deformity or atrophy. Skin: Warm and dry, no rash. Neuro:  Strength and sensation are intact. Psych: Normal affect.  Assessment & Plan    1. Atrial fibrillation - Maintaining NSR after cardioversion. Reports improvement in DOE and fatigue since restoration of NSR. Continue Coreg 12.5mg  twice daily, Eliquis 5mg  twice daily.   2. Chronic anticoagulation- Secondary to PAF.   Denies bleeding complications. Continue Eliquis 5mg  twice daily.  CHA2DS2-VASc of at least 5 (CHF, HTN, Hx 2, vascular disease).  He reports intermittent diarrhea and gas.  We discussed that this is not likely to be caused by the Eliquis but he will let us know if symptoms resolved in the next few weeks.  3. Chronic systolic heart failure- Euvolemic and well compensated on exam.  EF was stable on recent echo 02/08/2020 LVEF 35 to 40%.  GDMT includes Coreg, Lasix, valsartan.  He has previously been unable to afford Entresto.  Continue low-sodium, heart healthy diet.  Regular cardiovascular exercise encouraged.  4. CAD s/p CABG - Stable  with no anginal symptoms. No indication for ischemic evaluation at this time. GDMT includes beta blocker and statin. No aspirin secondary to chronic anticoagulation.   5. Frequent PVC -continue mexiletine 150 mg twice daily. No PVC on EKG today. Echo shows stable decreased LVEF.   6. HTN- BP well controlled. His BP is low normal today and reports no lightheadedness, dizziness.  He reports some diarrhea yesterday which may be contributory.  Encouraged to report symptomatic hypotension.  We will continue his present antihypertensive medications at this time.  7. HLD, LDL goal less than 70 - Continue Atorvastatin 40mg  daily.   8. Small abdominal aortic aneurysm -ultrasound 10/2019 showed aneurysm was 3 cm in diameter.  Repeat ultrasound in 2 to 3 years.  9. Gout - Did take colchicine the other day which could be contributory to his diarrhea.  Disposition: Follow up in 2 month(s) with Dr. 11/2019 or APP.    Kirke Corin, NP 02/22/2020, 1:46 PM

## 2020-02-22 NOTE — Patient Instructions (Addendum)
Medication Instructions:   No medication changes today.   Continue your Eliquis 5mg  twice daily. This prevents you from stroke. It is very important to continue taking this medication twice per day.   Low suspicion that the Eliquis is causing your diarrhea and gas. This would be a very uncommon side effect.   *If you need a refill on your cardiac medications before your next appointment, please call your pharmacy*  Lab Work: No lab work recommended today.   Testing/Procedures:  Your EKG today showed that you are still in normal sinus rhythm.   Your echocardiogram showed that your heart pumping function was stable at 35-40%. This is still reduced, but stable compared to previous echocardiogram.   Follow-Up: At [facility], you and your health needs are our priority.  As part of our continuing mission to provide you with exceptional heart care, we have created designated Provider Care Teams.  These Care Teams include your primary Cardiologist (physician) and Advanced Practice Providers (APPs -  Physician Assistants and Nurse Practitioners) who all work together to provide you with the care you need, when you need it.  We recommend signing up for the patient portal called "MyChart".  Sign up information is provided on this After Visit Summary.  MyChart is used to connect with patients for Virtual Visits (Telemedicine).  Patients are able to view lab/test results, encounter notes, upcoming appointments, etc.  Non-urgent messages can be sent to your provider as well.   To learn more about what you can do with MyChart, go to [facility].    Your next appointment:   2 month(s)  The format for your next appointment:   In Person  Provider:   You may see [email or web address], MD or one of the following Advanced Practice Providers on your designated Care Team:    [name], NP  Nicolasa Ducking, PA-C  Eula Listen, PA-C  Marisue Ivan, NP  Other  Instructions   Continue low salt diet.   You may use Gas-X over the counter as needed for gas.  Food Choices to Help Relieve Diarrhea, Adult When you have diarrhea, the foods you eat and your eating habits are very important. Choosing the right foods and drinks can help:  Relieve diarrhea.  Replace lost fluids and nutrients.  Prevent dehydration. What general guidelines should I follow?  Relieving diarrhea  Choose foods with less than 2 g or .07 oz. of fiber per serving.  Limit fats to less than 8 tsp (38 g or 1.34 oz.) a day.  Avoid the following: ? Foods and beverages sweetened with high-fructose corn syrup, honey, or sugar alcohols such as xylitol, sorbitol, and mannitol. ? Foods that contain a lot of fat or sugar. ? Fried, greasy, or spicy foods. ? High-fiber grains, breads, and cereals. ? Raw fruits and vegetables.  Eat foods that are rich in probiotics. These foods include dairy products such as yogurt and fermented milk products. They help increase healthy bacteria in the stomach and intestines (gastrointestinal tract, or GI tract).  If you have lactose intolerance, avoid dairy products. These may make your diarrhea worse.  Take medicine to help stop diarrhea (antidiarrheal medicine) only as told by your health care provider. Replacing nutrients  Eat small meals or snacks every 3-4 hours.  Eat bland foods, such as white rice, toast, or baked potato, until your diarrhea starts to get better. Gradually reintroduce nutrient-rich foods as tolerated or as told by your health care provider. This includes: ? Well-cooked protein foods. ?  Peeled, seeded, and soft-cooked fruits and vegetables. ? Low-fat dairy products.  Take vitamin and mineral supplements as told by your health care provider. Preventing dehydration  Start by sipping water or a special solution to prevent dehydration (oral rehydration solution, ORS). Urine that is clear or pale yellow means that you are  getting enough fluid.  Try to drink at least 8-10 cups of fluid each day to help replace lost fluids.  You may add other liquids in addition to water, such as clear juice or decaffeinated sports drinks, as tolerated or as told by your health care provider.  Avoid drinks with caffeine, such as coffee, tea, or soft drinks.  Avoid alcohol. What foods are recommended?     The items listed may not be a complete list. Talk with your health care provider about what dietary choices are best for you. Grains White rice. White, [place], or pita breads (fresh or toasted), including plain rolls, buns, or bagels. White pasta. Saltine, soda, or graham crackers. Pretzels. Low-fiber cereal. Cooked cereals made with water (such as cornmeal, farina, or cream cereals). Plain muffins. Matzo. Melba toast. Zwieback. Vegetables Potatoes (without the skin). Most well-cooked and canned vegetables without skins or seeds. Tender lettuce. Fruits Apple sauce. Fruits canned in juice. Cooked apricots, cherries, grapefruit, peaches, pears, or plums. Fresh bananas and cantaloupe. Meats and other protein foods Baked or boiled chicken. Eggs. Tofu. Fish. Seafood. Smooth nut butters. Ground or well-cooked tender beef, ham, veal, lamb, pork, or poultry. Dairy Plain yogurt, kefir, and unsweetened liquid yogurt. Lactose-free milk, buttermilk, skim milk, or soy milk. Low-fat or nonfat hard cheese. Beverages Water. Low-calorie sports drinks. Fruit juices without pulp. Strained tomato and vegetable juices. Decaffeinated teas. Sugar-free beverages not sweetened with sugar alcohols. Oral rehydration solutions, if approved by your health care provider. Seasoning and other foods Bouillon, broth, or soups made from recommended foods. What foods are not recommended? The items listed may not be a complete list. Talk with your health care provider about what dietary choices are best for you. Grains Whole grain, whole wheat, bran, or rye  breads, rolls, pastas, and crackers. Wild or brown rice. Whole grain or bran cereals. Barley. Oats and oatmeal. Corn tortillas or taco shells. Granola. Popcorn. Vegetables Raw vegetables. Fried vegetables. Cabbage, broccoli, Brussels sprouts, artichokes, baked beans, beet greens, corn, kale, legumes, peas, sweet potatoes, and yams. Potato skins. Cooked spinach and cabbage. Fruits Dried fruit, including raisins and dates. Raw fruits. Stewed or dried prunes. Canned fruits with syrup. Meat and other protein foods Fried or fatty meats. Deli meats. Chunky nut butters. Nuts and seeds. Beans and lentils. Tomasa Blase. Hot dogs. Sausage. Dairy High-fat cheeses. Whole milk, chocolate milk, and beverages made with milk, such as milk shakes. Half-and-half. Cream. sour cream. Ice cream. Beverages Caffeinated beverages (such as coffee, tea, soda, or energy drinks). Alcoholic beverages. Fruit juices with pulp. Prune juice. Soft drinks sweetened with high-fructose corn syrup or sugar alcohols. High-calorie sports drinks. Fats and oils Butter. Cream sauces. Margarine. Salad oils. Plain salad dressings. Olives. Avocados. Mayonnaise. Sweets and desserts Sweet rolls, doughnuts, and sweet breads. Sugar-free desserts sweetened with sugar alcohols such as xylitol and sorbitol. Seasoning and other foods Honey. Hot sauce. Chili powder. Gravy. Cream-based or milk-based soups. Pancakes and waffles. Summary  When you have diarrhea, the foods you eat and your eating habits are very important.  Make sure you get at least 8-10 cups of fluid each day, or enough to keep your urine clear or pale yellow.  Eat  bland foods and gradually reintroduce healthy, nutrient-rich foods as tolerated, or as told by your health care provider.  Avoid high-fiber, fried, greasy, or spicy foods. This information is not intended to replace advice given to you by your health care provider. Make sure you discuss any questions you have with your health  care provider. Document Revised: 12/02/2018 Document Reviewed: 08/08/2016 Elsevier Patient Education  2020 [employer].

## 2020-04-02 ENCOUNTER — Other Ambulatory Visit: Payer: Self-pay | Admitting: Cardiovascular Disease

## 2020-04-16 ENCOUNTER — Other Ambulatory Visit: Payer: Self-pay | Admitting: Cardiovascular Disease

## 2020-04-23 ENCOUNTER — Other Ambulatory Visit: Payer: Self-pay

## 2020-04-23 ENCOUNTER — Ambulatory Visit: Payer: PPO | Admitting: Family

## 2020-04-23 ENCOUNTER — Encounter: Payer: Self-pay | Admitting: Family

## 2020-04-23 VITALS — BP 120/52 | HR 69 | Ht 71.0 in | Wt 263.0 lb

## 2020-04-23 DIAGNOSIS — I493 Ventricular premature depolarization: Secondary | ICD-10-CM | POA: Diagnosis not present

## 2020-04-23 DIAGNOSIS — Z7901 Long term (current) use of anticoagulants: Secondary | ICD-10-CM | POA: Diagnosis not present

## 2020-04-23 DIAGNOSIS — I25118 Atherosclerotic heart disease of native coronary artery with other forms of angina pectoris: Secondary | ICD-10-CM

## 2020-04-23 DIAGNOSIS — I480 Paroxysmal atrial fibrillation: Secondary | ICD-10-CM

## 2020-04-23 DIAGNOSIS — I5022 Chronic systolic (congestive) heart failure: Secondary | ICD-10-CM | POA: Diagnosis not present

## 2020-04-23 NOTE — Progress Notes (Signed)
Office Visit    Patient Name: STEVIE CHARTER Date of Encounter: 04/23/2020  Primary Care Provider:  [name], MD Primary Cardiologist:  [name], MD Electrophysiologist:  Sherryl Manges, MD   Chief Complaint    [name] [name] is a 79 y.o. male with a hx of CAD, chronic systolic heart failure, frequent PVC, atrial fibrillation presents today for follow-up after cardioversion  Past Medical History    Past Medical History:  Diagnosis Date  . AAA (abdominal aortic aneurysm) (HCC)   . ACE inhibitor associated hyperkalemia 09/20/2015  . Arthritis   . CAD (coronary artery disease)    a. 03/2014 NSTEMI--> 05/2014 CABG x 5 (LIMA->D1, VG->LAD, VG->OM1, VG->OM2, VG->RPDA);  c. 04/2015 Cath: LM nl, LAD 100ost, 40d, RI 80, LCX 80p/m, OM1 70, OM2 80, RCA 20p, 4m, 90d, VG->dLAD min irregs, VG->OM1 100, VG->OM2 100, VG->RPDA nl, LIMA->D2 nl.  . Chronic systolic CHF (congestive heart failure) (HCC)    a. EF 25-30% by 2D ECHO 04/20/14. Severely dec global hypokinesis. mildly elevated RVSP;  b. 12/2014 Echo: EF 40-45%, mod dil LA, mildly dil RV/RA;  c. 04/2015 Echo: EF 30-35%, sev antsept HK, Gr1 DD, mildly dil RA/LA;  d. 07/2015 Echo: EF 35-40%, ant/antsept HK, Gr1 DD, mildly red RV fxn.  . Diabetes mellitus without complication (HCC)   . Epigastric hernia   . Frequent PVCs    a. 24 hr Holter 06/2015: >28K PVCs accounting for 25% of all beats, rare PAC-->mexilitene started;  b. 07/2015 Holter: 1700 PVC's/24 hrs.  [name] GERD (gastroesophageal reflux disease)   . Gout   . Hyperlipidemia   . Hypertension   . Ischemic cardiomyopathy    a. EF 25-30% 03/2014;  b. EF 40-45% 12/2014.  . Mitral regurgitation    a. 03/2014 TEE: mild to mod MR.  . Nephrolithiasis   . Neuromuscular disorder (HCC)    DIABETIC NEUROPATHY  . Obesity   . Umbilical hernia    Past Surgical History:  Procedure Laterality Date  . CARDIAC CATHETERIZATION     [facility]  . CARDIAC CATHETERIZATION N/A 05/23/2015    Procedure: Left Heart Cath and Coronary Angiography;  Surgeon: Iran Ouch, MD;  Location: [facility] INVASIVE CV LAB;  Service: Cardiovascular;  Laterality: N/A;  . CARDIOVERSION N/A 01/30/2020   Procedure: CARDIOVERSION;  Surgeon: Iran Ouch, MD;  Location: [facility] ORS;  Service: Cardiovascular;  Laterality: N/A;  . CARPAL TUNNEL RELEASE Right   . CORONARY ARTERY BYPASS GRAFT N/A 06/06/2014   Procedure: CORONARY ARTERY BYPASS GRAFTING (CABG) x 5 USING LEFT AND RIGHT SAPHENOUS LEG VEIN HARVESTED ENDOSCOPICALLY;  Surgeon: Kerin Perna, MD;  Location: [facility] OR;  Service: Open Heart Surgery;  Laterality: N/A;  . ESOPHAGOGASTRODUODENOSCOPY (EGD) WITH PROPOFOL N/A 02/11/2016   Procedure: ESOPHAGOGASTRODUODENOSCOPY (EGD) WITH PROPOFOL with dialation;  Surgeon: [name], MD;  Location: [facility];  Service: Endoscopy;  Laterality: N/A;  diabetic, oral meds  . INTRAOPERATIVE TRANSESOPHAGEAL ECHOCARDIOGRAM N/A 06/06/2014   Procedure: INTRAOPERATIVE TRANSESOPHAGEAL ECHOCARDIOGRAM;  Surgeon: Kerin Perna, MD;  Location: [facility] OR;  Service: Open Heart Surgery;  Laterality: N/A;  . ROTATOR CUFF REPAIR Right 1996,1997,2000  . TEE WITHOUT CARDIOVERSION N/A 04/24/2014   Procedure: TRANSESOPHAGEAL ECHOCARDIOGRAM (TEE);  Surgeon: [name], MD;  Location: [facility] ENDOSCOPY;  Service: Cardiovascular;  Laterality: N/A;   Allergies  Allergies  Allergen Reactions  . Glimepiride     Unusual sweating and irregular heart beat    History of Present Illness    [name]  YATES [name] is a 79 y.o. male with a hx of CAD, chronic systolic heart failure, frequent PVC, atrial fibrillation, small AAA, DM2, neuropathy, HTN, HLD last seen for cardioversion 01/30/2020.  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  He has a history of ACE induced hyperkalemia and this was subsequently been discontinued.  September 2016 he had worsening LVEF 30-35% in the setting of  frequent PVCs with Holter monitoring and was placed on mexiletine therapy with significant improvement.  He has been previously unable to afford Entresto due to cost.  Echo November 2018 with LVEF 35 to 40%.    Seen 12/29/2019 by Eula Listen, PA and noted to be in new onset atrial fibrillation with controlled rate,  started on Eliquis.  Consent in clinic 5/27/known by Dr. Sanborn Sink and reported worsening exertional dyspnea without chest pain.  He was recommended for cardioversion and echocardiogram.  Cardioversion performed 01/30/20.  Echocardiogram 02/08/2020 LVEF 35-40%, RV systolic function low normal, RV normal size, LA mildly dilated, no significant valvular abnormality.  Last seen 02/22/20 with improvement in DOE and fatigue since cardioversion.   Presents today today for follow-up feeling overall well. Continues to mow his yard for exercise. Does note he takes breaks as he has arthritis in his back and hips which causes him some pain. Reports no palpitations, near-syncope, syncope.  He has multiple concerns regarding Eliquis which is costing him $45 per month and is very fearful entering the donut hole.  He was provided with patient assistance paperwork.  EKGs/Labs/Other Studies Reviewed:   The following studies were reviewed today:  Echo 02/08/20  1. Left ventricular ejection fraction, by estimation, is 35 to 40%. The  left ventricle has moderately decreased function. The left ventricle  demonstrates global hypokinesis. Left ventricular diastolic function could  not be evaluated.   2. Right ventricular systolic function is low normal. The right  ventricular size is normal.   3. Left atrial size was mildly dilated.   4. The mitral valve is normal in structure. No evidence of mitral valve  regurgitation.   5. The aortic valve is grossly normal. Aortic valve regurgitation is not  visualized.   EKG:  EKG is ordered today.  The ekg ordered today demonstrates sinus and 69 bpm with infrequent PVCs and  possible prior inferior infarct with nonspecific ST/T wave changes.  Recent Labs: 12/29/2019: ALT 16; Magnesium 2.1; TSH 2.580 01/19/2020: BUN 18; Creatinine, Ser 1.26; Hemoglobin 14.6; Platelets 245; Potassium 4.4; Sodium 141  Recent Lipid Panel    Component Value Date/Time   CHOL 95 (L) 12/25/2014 1215   TRIG 129 12/25/2014 1215   HDL 25 (L) 12/25/2014 1215   CHOLHDL 3.8 12/25/2014 1215   CHOLHDL 3.8 04/24/2014 0407   VLDL 23 04/24/2014 0407   LDLCALC 44 12/25/2014 1215    Home Medications   Current Meds  Medication Sig  . acetaminophen (TYLENOL) 500 MG tablet Take 500-1,000 mg by mouth every 6 (six) hours as needed (for pain.).  [name] apixaban (ELIQUIS) 5 MG TABS tablet Take 1 tablet (5 mg total) by mouth 2 (two) times daily.  [name] aspirin EC 81 MG tablet Take 1 tablet (81 mg total) by mouth daily.  [name] atorvastatin (LIPITOR) 40 MG tablet TAKE 1 TABLET BY MOUTH ONCE DAILY AT 6 PM.  . carvedilol (COREG) 12.5 MG tablet TAKE 1 TABLET BY MOUTH TWICE DAILY WITH A MEAL  . clotrimazole-betamethasone (LOTRISONE) cream Apply 1  application topically 2 (two) times daily as needed (skin irritation/breakouts).   . colchicine 0.6 MG tablet Take 0.6 mg by mouth daily as needed (for gout flareup).   . diphenhydramine-acetaminophen (TYLENOL PM) 25-500 MG TABS tablet Take 1 tablet by mouth at bedtime as needed (sleep).   . furosemide (LASIX) 40 MG tablet Take 1 tablet by mouth once daily  . gabapentin (NEURONTIN) 300 MG capsule Take 300 mg by mouth 4 (four) times daily.   [name] HYDROcodone-acetaminophen (NORCO/VICODIN) 5-325 MG tablet Take 1 tablet by mouth every 6 (six) hours as needed for moderate pain.  [name] mexiletine (MEXITIL) 150 MG capsule Take 2 capsules by mouth twice daily (Patient taking differently: Take 150 mg by mouth in the morning, at noon, in the evening, and at bedtime. )  . nitroGLYCERIN (NITROSTAT) 0.4 MG SL tablet Place 1 tablet (0.4 mg total) under the tongue every 5 (five) minutes as needed for  chest pain.  . valsartan (DIOVAN) 40 MG tablet Take 1 tablet by mouth once daily    Review of Systems   Review of Systems  Constitutional: Negative for chills, fever and malaise/fatigue.  Cardiovascular: Negative for chest pain, dyspnea on exertion, irregular heartbeat, leg swelling, near-syncope, orthopnea, palpitations and syncope.  Respiratory: Negative for cough, shortness of breath and wheezing.   Gastrointestinal: Negative for melena, nausea and vomiting.  Genitourinary: Negative for hematuria.  Neurological: Negative for dizziness, light-headedness and weakness.   All other systems reviewed and are otherwise negative except as noted above.  Physical Exam    VS:  BP (!) 120/52 (BP Location: Left Arm, Patient Position: Sitting, Cuff Size: Normal)   Pulse 69   Ht 5\' 11"  (1.803 m)   Wt 263 lb (119.3 kg)   BMI 36.68 kg/m  , BMI Body mass index is 36.68 kg/m. GEN: Well nourished, overweight, well developed, in no acute distress. HEENT: normal. Neck: Supple, no JVD, carotid bruits, or masses. Cardiac: RRR, no murmurs, rubs, or gallops. No clubbing, cyanosis, edema.  Radials/DP/PT 2+ and equal bilaterally.  Respiratory:  Respirations regular and unlabored, clear to auscultation bilaterally. GI: Soft, nontender, nondistended, BS + x 4. MS: No deformity or atrophy. Skin: Warm and dry, no rash. Neuro:  Strength and sensation are intact. Psych: Normal affect.  Assessment & Plan    1. Atrial fibrillation - Maintaining NSR.  No palpitations.  Continue Coreg 12.5mg  twice daily, Eliquis 5mg  twice daily.   2. Chronic anticoagulation- Secondary to PAF.  Denies bleeding complications. Continue Eliquis 5mg  twice daily.  CHA2DS2-VASc of at least 5 (CHF, HTN, Hx 2, vascular disease).  He is concerned regarding cost presently being $45 per prescription but fearful of when he interested alcohol.  Provided with patient assistance paperwork and he will return to clinic for to fill out provider  portion.  3. Chronic systolic heart failure- Euvolemic and well compensated on exam.  EF was stable on recent echo 02/08/2020 LVEF 35 to 40%.  GDMT includes Coreg, Lasix, valsartan.  He has previously been unable to afford Entresto.  Continue low-sodium, heart healthy diet.  Regular cardiovascular exercise encouraged.  4. CAD s/p CABG - Stable with no anginal symptoms. No indication for ischemic evaluation at this time. GDMT includes beta blocker and statin. No aspirin secondary to chronic anticoagulation.   5. Frequent PVC -continue mexiletine 150 mg twice daily.  Infrequent PVC on EKG today. Echo shows stable decreased LVEF.   6. HTN- BP well controlled. Continue current antihypertensive regimen.  Reports very intermittent  lightheadedness for which I recommend making position changes slowly. If this is persistent we could consider reducing dose of Lasix in the future.   7. HLD, LDL goal less than 70 - Continue Atorvastatin 40mg  daily.   8. Small abdominal aortic aneurysm -ultrasound 10/2019 showed aneurysm was 3 cm in diameter.  Repeat ultrasound in 2 to 3 years.  Disposition: Follow up in 3 month(s) with Dr. Kirke Corin or APP.    Alver Sorrow, NP 04/23/2020, 11:56 AM

## 2020-04-23 NOTE — Patient Instructions (Addendum)
Medication Instructions:  Continue your current medications.  It is important to stay on Eliquis 5mg  twice per day as it helps protect you from a stroke. Taking it once per day will not provide adequate protection.   Please bring back your filled out Eliquis paperwork to the office at your convenience.   *If you need a refill on your cardiac medications before your next appointment, please call your pharmacy*  Lab Work: No lab work today.   Testing/Procedures: Your EKG today shows normal sinus rhythm with an occasional early beat called a PVC. These are not dangerous and are very common.  Follow-Up: At [facility], you and your health needs are our priority.  As part of our continuing mission to provide you with exceptional heart care, we have created designated Provider Care Teams.  These Care Teams include your primary Cardiologist (physician) and Advanced Practice Providers (APPs -  Physician Assistants and Nurse Practitioners) who all work together to provide you with the care you need, when you need it.  We recommend signing up for the patient portal called "MyChart".  Sign up information is provided on this After Visit Summary.  MyChart is used to connect with patients for Virtual Visits (Telemedicine).  Patients are able to view lab/test results, encounter notes, upcoming appointments, etc.  Non-urgent messages can be sent to your provider as well.   To learn more about what you can do with MyChart, go to [facility].    Your next appointment:   3-4 month(s)  The format for your next appointment:   In Person  Provider:   You may see [email or web address], MD or one of the following Advanced Practice Providers on your designated Care Team:   [name], NP  Nicolasa Ducking, PA-C  Eula Listen, NP  Gillian Shields, PA-C  Other Instructions   Orthostatic Hypotension Blood pressure is a measurement of how strongly, or weakly, your blood is pressing against the  walls of your arteries. Orthostatic hypotension is a sudden drop in blood pressure that happens when you quickly change positions, such as when you get up from sitting or lying down. Arteries are blood vessels that carry blood from your heart throughout your body. When blood pressure is too low, you may not get enough blood to your brain or to the rest of your organs. This can cause weakness, light-headedness, rapid heartbeat, and fainting. This can last for just a few seconds or for up to a few minutes. Orthostatic hypotension is usually not a serious problem. However, if it happens frequently or gets worse, it may be a sign of something more serious. What are the signs or symptoms? Symptoms of this condition may include:  Weakness.  Light-headedness.  Dizziness.  Blurred vision.  Fatigue.  Rapid heartbeat.  Fainting, in severe cases. Follow these instructions at home: Eating and drinking   Drink enough fluid to keep your urine pale yellow.  Eat a healthy diet, and follow instructions from your health care provider about eating or drinking restrictions. A healthy diet includes: ? Fresh fruits and vegetables. ? Whole grains. ? Lean meats. ? Low-fat dairy products.  Eat frequent, small meals.  Avoid standing up suddenly after eating. General instructions   Wear compression stockings as told by your health care provider.  Get up slowly from lying down or sitting positions. This gives your blood pressure a chance to adjust.  Avoid hot showers and excessive heat as directed by your health care provider.  Return to your normal  activities as told by your health care provider. Ask your health care provider what activities are safe for you.  Do not use any products that contain nicotine or tobacco, such as cigarettes, e-cigarettes, and chewing tobacco. If you need help quitting, ask your health care provider.  Keep all follow-up visits as told by your health care provider. This  is important.

## 2020-05-02 ENCOUNTER — Telehealth: Payer: Self-pay

## 2020-05-02 ENCOUNTER — Other Ambulatory Visit: Payer: Self-pay | Admitting: Physician Assistant

## 2020-05-02 NOTE — Telephone Encounter (Signed)
Patient assistance application and required documentation for Eliquis faxed to [facility] [employer].  Fax confirmation received.

## 2020-05-14 ENCOUNTER — Other Ambulatory Visit: Payer: Self-pay | Admitting: Cardiovascular Disease

## 2020-05-17 NOTE — Telephone Encounter (Signed)
Per fax from [facility], patient has been approved to receive Eliquis free of charge from 05/04/2020 through 08/24/2020.

## 2020-06-17 DIAGNOSIS — T2124XA Burn of second degree of lower back, initial encounter: Secondary | ICD-10-CM | POA: Diagnosis not present

## 2020-06-17 DIAGNOSIS — M5442 Lumbago with sciatica, left side: Secondary | ICD-10-CM | POA: Diagnosis not present

## 2020-06-25 ENCOUNTER — Emergency Department: Payer: PPO

## 2020-06-25 ENCOUNTER — Emergency Department
Admission: EM | Admit: 2020-06-25 | Discharge: 2020-06-25 | Disposition: A | Discharge status: Home / Self Care | Payer: PPO | Attending: Student in an Organized Health Care Education/Training Program | Admitting: Student in an Organized Health Care Education/Training Program

## 2020-06-25 ENCOUNTER — Other Ambulatory Visit: Payer: Self-pay

## 2020-06-25 DIAGNOSIS — I110 Hypertensive heart disease with heart failure: Secondary | ICD-10-CM | POA: Insufficient documentation

## 2020-06-25 DIAGNOSIS — I5022 Chronic systolic (congestive) heart failure: Secondary | ICD-10-CM | POA: Diagnosis not present

## 2020-06-25 DIAGNOSIS — Z87891 Personal history of nicotine dependence: Secondary | ICD-10-CM | POA: Insufficient documentation

## 2020-06-25 DIAGNOSIS — E1142 Type 2 diabetes mellitus with diabetic polyneuropathy: Secondary | ICD-10-CM | POA: Insufficient documentation

## 2020-06-25 DIAGNOSIS — Z7982 Long term (current) use of aspirin: Secondary | ICD-10-CM | POA: Insufficient documentation

## 2020-06-25 DIAGNOSIS — I2510 Atherosclerotic heart disease of native coronary artery without angina pectoris: Secondary | ICD-10-CM | POA: Insufficient documentation

## 2020-06-25 DIAGNOSIS — R109 Unspecified abdominal pain: Secondary | ICD-10-CM | POA: Insufficient documentation

## 2020-06-25 DIAGNOSIS — R52 Pain, unspecified: Secondary | ICD-10-CM | POA: Diagnosis not present

## 2020-06-25 DIAGNOSIS — M545 Low back pain, unspecified: Secondary | ICD-10-CM | POA: Insufficient documentation

## 2020-06-25 DIAGNOSIS — Z951 Presence of aortocoronary bypass graft: Secondary | ICD-10-CM | POA: Diagnosis not present

## 2020-06-25 DIAGNOSIS — I10 Essential (primary) hypertension: Secondary | ICD-10-CM | POA: Diagnosis not present

## 2020-06-25 DIAGNOSIS — Z79899 Other long term (current) drug therapy: Secondary | ICD-10-CM | POA: Insufficient documentation

## 2020-06-25 DIAGNOSIS — M549 Dorsalgia, unspecified: Secondary | ICD-10-CM | POA: Diagnosis not present

## 2020-06-25 DIAGNOSIS — M5459 Other low back pain: Secondary | ICD-10-CM | POA: Diagnosis not present

## 2020-06-25 LAB — URINALYSIS, COMPLETE (UACMP) WITH MICROSCOPIC
Bacteria, UA: NONE SEEN
Bilirubin Urine: NEGATIVE
Glucose, UA: NEGATIVE mg/dL
Ketones, ur: NEGATIVE mg/dL
Leukocytes,Ua: NEGATIVE
Nitrite: NEGATIVE
Protein, ur: NEGATIVE mg/dL
Specific Gravity, Urine: 1.005 (ref 1.005–1.030)
Squamous Epithelial / HPF: NONE SEEN (ref 0–5)
pH: 5 (ref 5.0–8.0)

## 2020-06-25 LAB — COMPREHENSIVE METABOLIC PANEL
ALT: 24 U/L (ref 0–44)
AST: 20 U/L (ref 15–41)
Albumin: 3.7 g/dL (ref 3.5–5.0)
Alkaline Phosphatase: 71 U/L (ref 38–126)
Anion gap: 7 (ref 5–15)
BUN: 36 mg/dL — ABNORMAL HIGH (ref 8–23)
CO2: 30 mmol/L (ref 22–32)
Calcium: 8.9 mg/dL (ref 8.9–10.3)
Chloride: 99 mmol/L (ref 98–111)
Creatinine, Ser: 1.79 mg/dL — ABNORMAL HIGH (ref 0.61–1.24)
GFR, Estimated: 39 mL/min — ABNORMAL LOW (ref >60)
Glucose, Bld: 103 mg/dL — ABNORMAL HIGH (ref 70–99)
Potassium: 5 mmol/L (ref 3.5–5.1)
Sodium: 136 mmol/L (ref 135–145)
Total Bilirubin: 0.7 mg/dL (ref 0.3–1.2)
Total Protein: 7 g/dL (ref 6.5–8.1)

## 2020-06-25 LAB — CBC WITH DIFFERENTIAL/PLATELET
Abs Immature Granulocytes: 0.05 10*3/uL (ref 0.00–0.07)
Basophils Absolute: 0.1 10*3/uL (ref 0.0–0.1)
Basophils Relative: 1 %
Eosinophils Absolute: 0.4 10*3/uL (ref 0.0–0.5)
Eosinophils Relative: 4 %
HCT: 43.4 % (ref 39.0–52.0)
Hemoglobin: 14.2 g/dL (ref 13.0–17.0)
Immature Granulocytes: 1 %
Lymphocytes Relative: 22 %
Lymphs Abs: 2 10*3/uL (ref 0.7–4.0)
MCH: 29.6 pg (ref 26.0–34.0)
MCHC: 32.7 g/dL (ref 30.0–36.0)
MCV: 90.4 fL (ref 80.0–100.0)
Monocytes Absolute: 0.7 10*3/uL (ref 0.1–1.0)
Monocytes Relative: 8 %
Neutro Abs: 5.9 10*3/uL (ref 1.7–7.7)
Neutrophils Relative %: 64 %
Platelets: 244 10*3/uL (ref 150–400)
RBC: 4.8 MIL/uL (ref 4.22–5.81)
RDW: 12.5 % (ref 11.5–15.5)
WBC: 9.1 10*3/uL (ref 4.0–10.5)
nRBC: 0 % (ref 0.0–0.2)

## 2020-06-25 IMAGING — CR DG LUMBAR SPINE 2-3V
1 series · 2 of 2 positions shown · non-contrast
Comparison: None.

CLINICAL DATA: Back pain for 1 week

EXAM:
LUMBAR SPINE - 2-3 VIEW

[Series 1: w lumbar spine ap · 0.14mm/px · 2 of 2 slices shown]
[im 1/2]
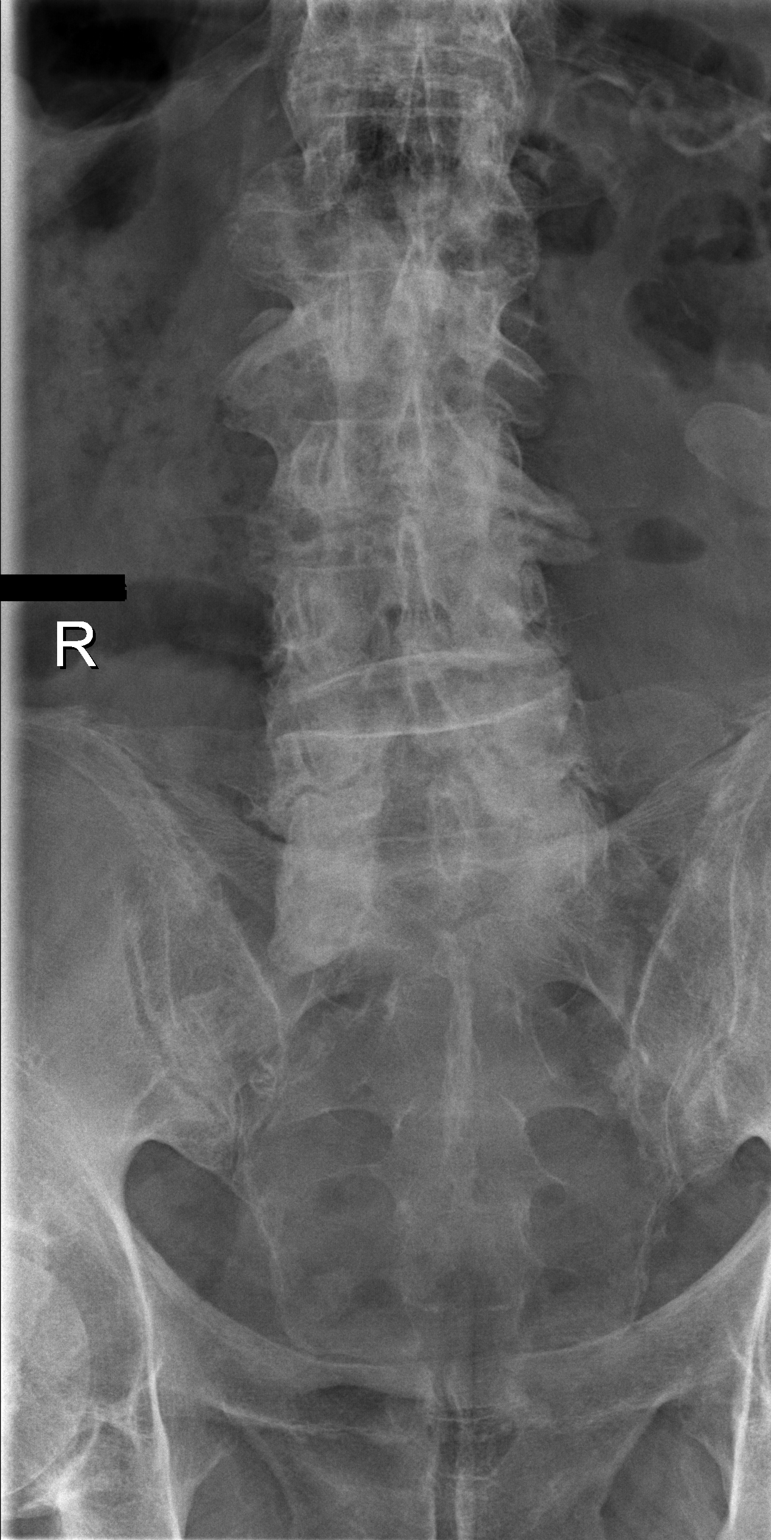
[im 2/2]
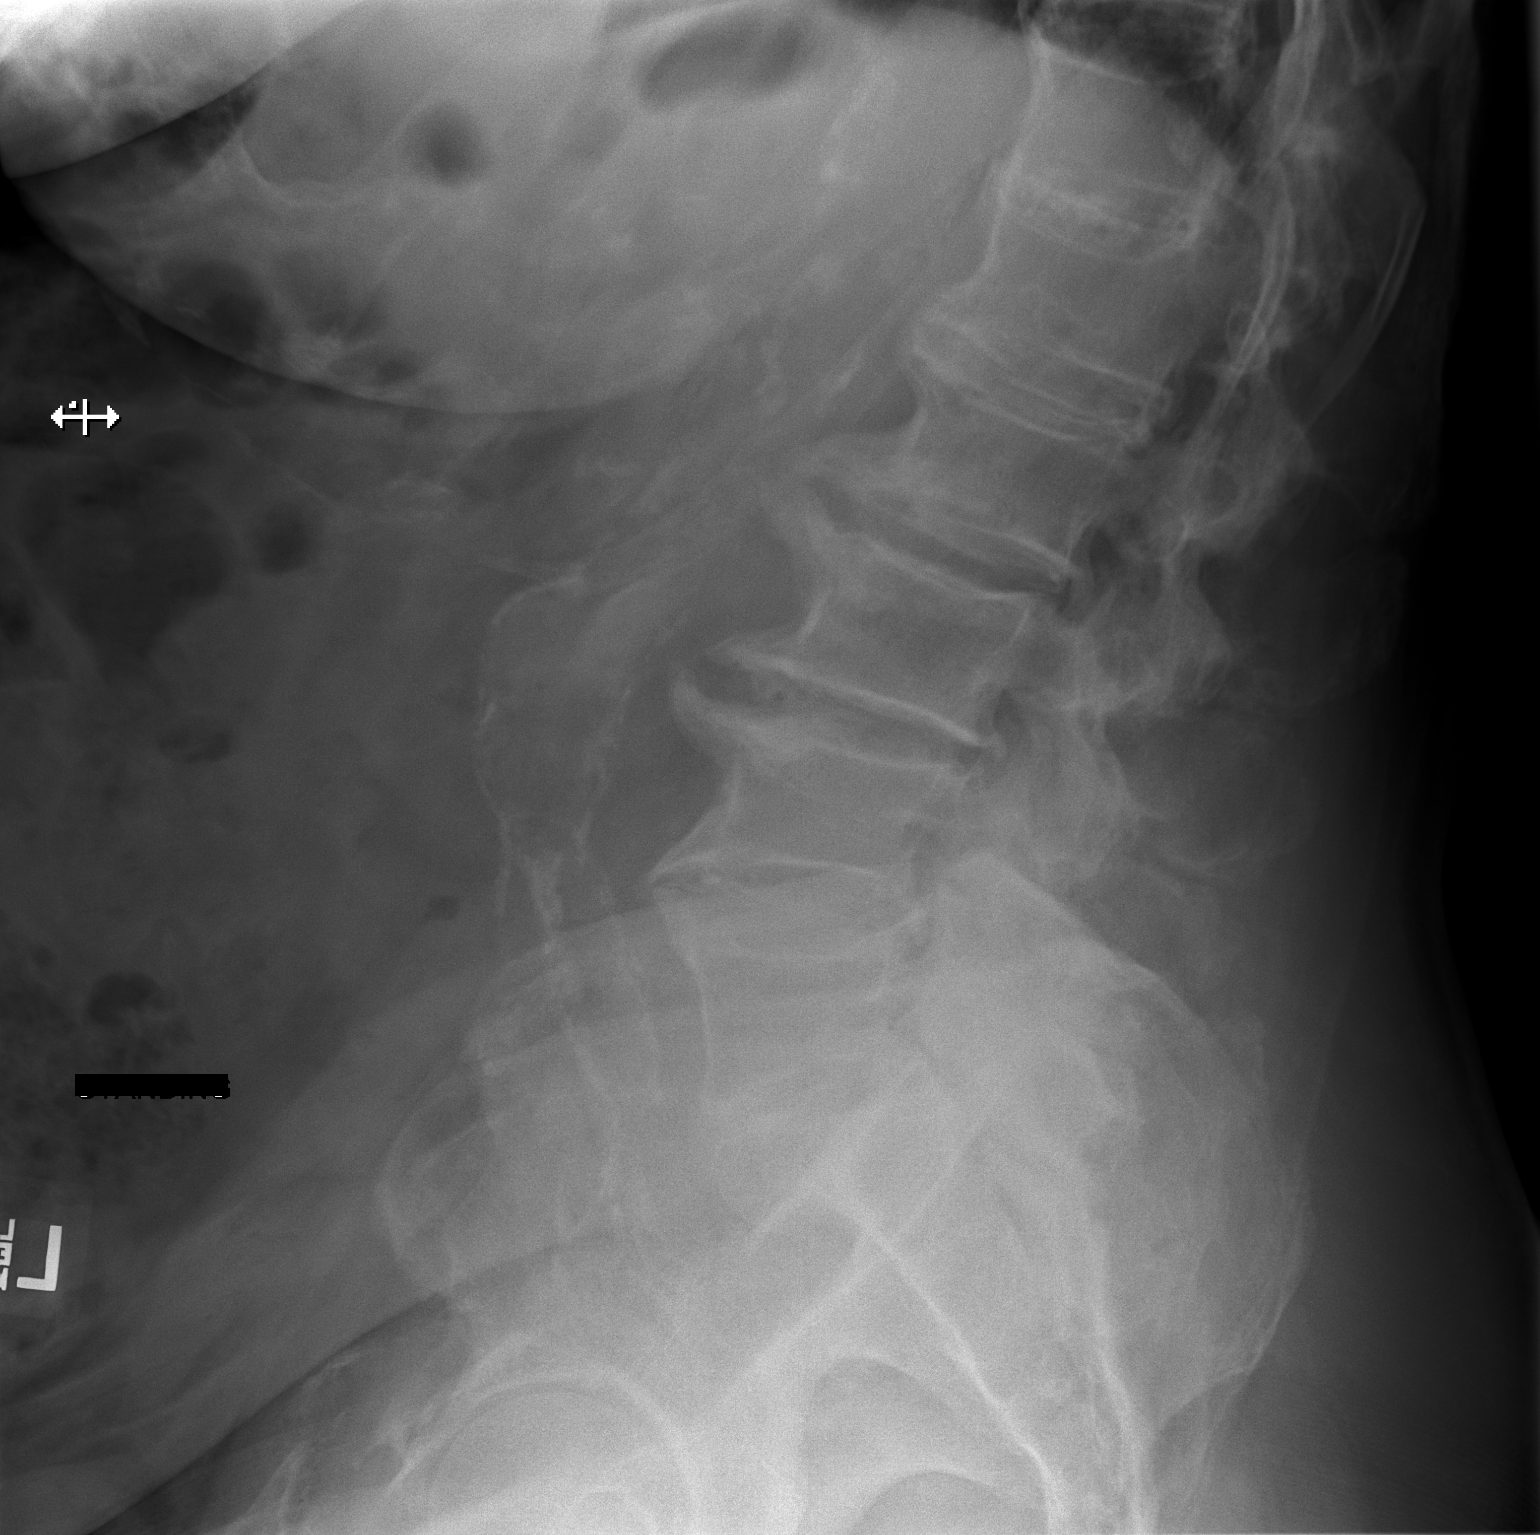

[2 of 2 positions shown; findings below may reference images not displayed]

FINDINGS: Frontal and lateral views of the lumbar spine demonstrate 5
non-rib-bearing lumbar type vertebral bodies with mild right convex
curvature centered at L2. Otherwise alignment is anatomic. There is
severe diffuse spondylosis with disc space narrowing and osteophyte
formation at all levels, greatest at the lumbosacral junction. There
is diffuse facet hypertrophy, greatest at L4-5 and L5-S1. No
fractures. Sacroiliac joints are normal. Extensive atherosclerosis
of the abdominal aorta.
IMPRESSION: 1. Severe diffuse spondylosis and facet hypertrophy throughout the
lumbar spine, greatest at the lumbosacral junction.
2. No acute fracture.
3. Extensive aortic atherosclerosis.

## 2020-06-25 IMAGING — CT CT ABD-PELV W/ CM
2 of 5 series · 15 of 46 positions shown, 17 images · IV contrast (APPLIED)
Comparison: [DATE]

CLINICAL DATA: Low back pain

EXAM:
CT ABDOMEN AND PELVIS WITH CONTRAST
TECHNIQUE: Multidetector CT imaging of the abdomen and pelvis was performed
using the standard protocol following bolus administration of
intravenous contrast.
CONTRAST:  75mL OMNIPAQUE IOHEXOL 350 MG/ML SOLN

[Series 2: routine abd/pel with · axial · 0.98mm/px · z∈[-273,+187]mm · 12 of 104 slices shown, 14 images]
[im 6/104  soft-tissue]
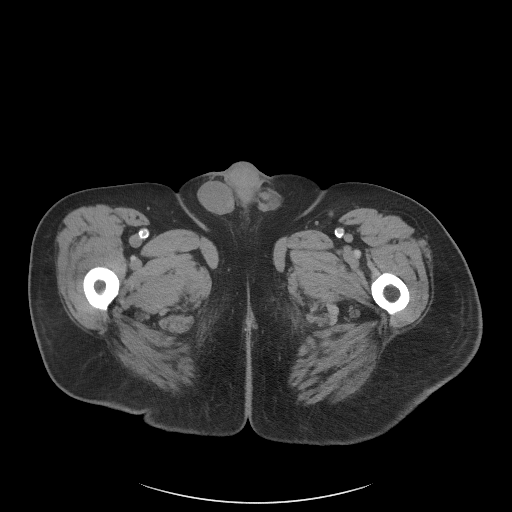
[im 6/104  bone]
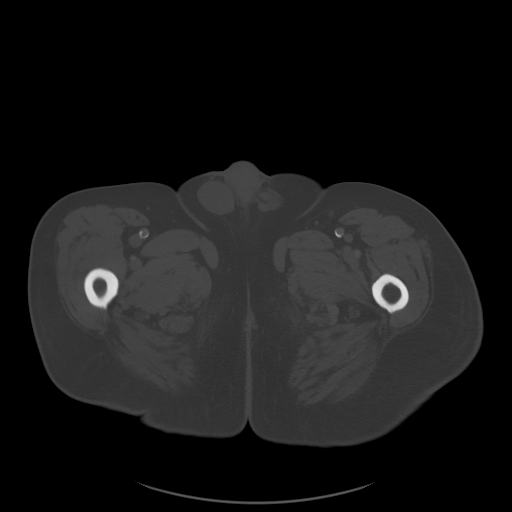
[im 16/104  soft-tissue]
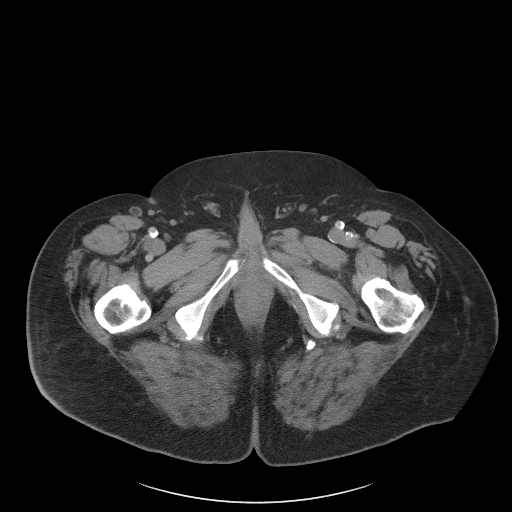
[im 21/104  soft-tissue]
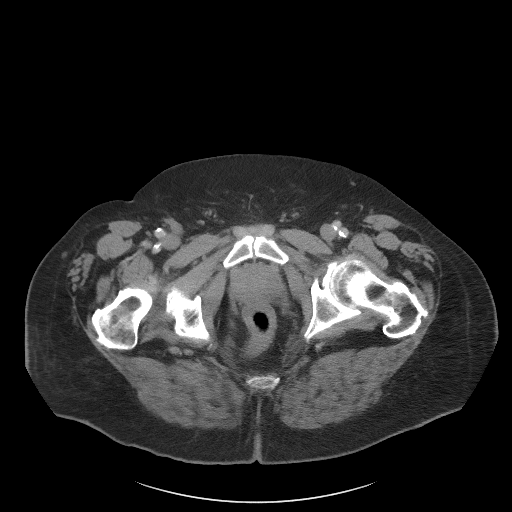
[im 31/104  soft-tissue]
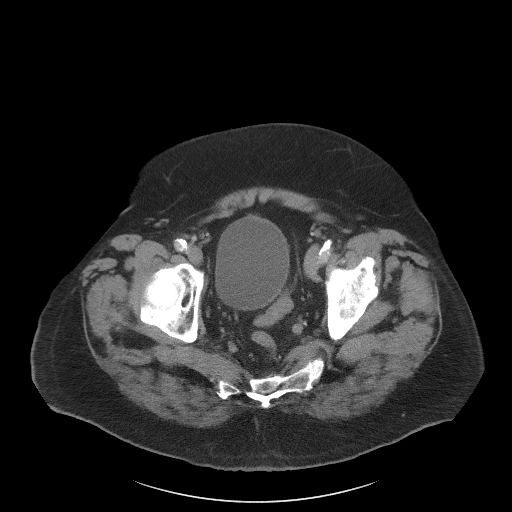
[im 42/104  soft-tissue]
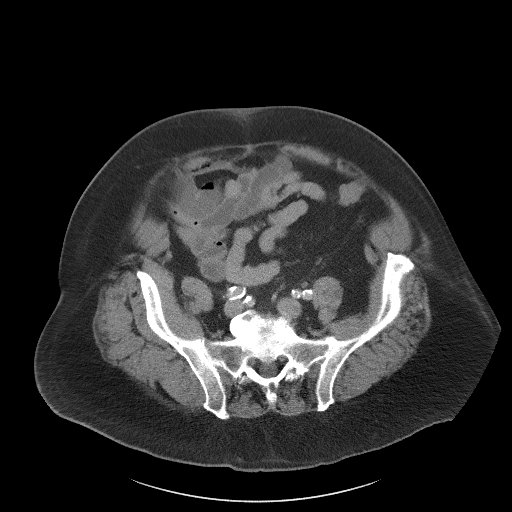
[im 47/104  soft-tissue]
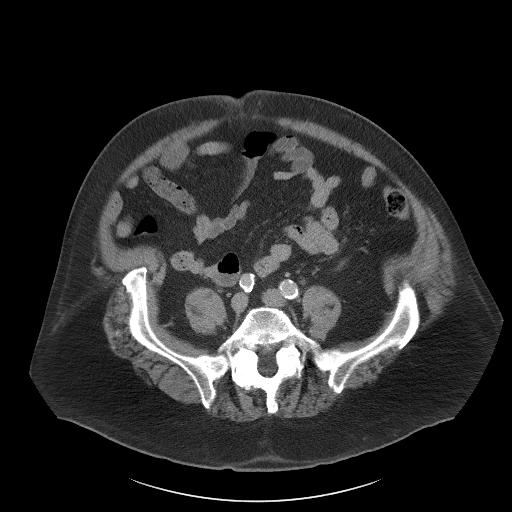
[im 57/104  soft-tissue]
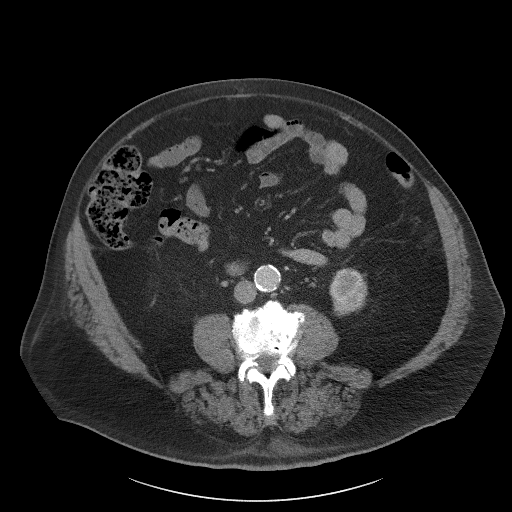
[im 62/104  soft-tissue]
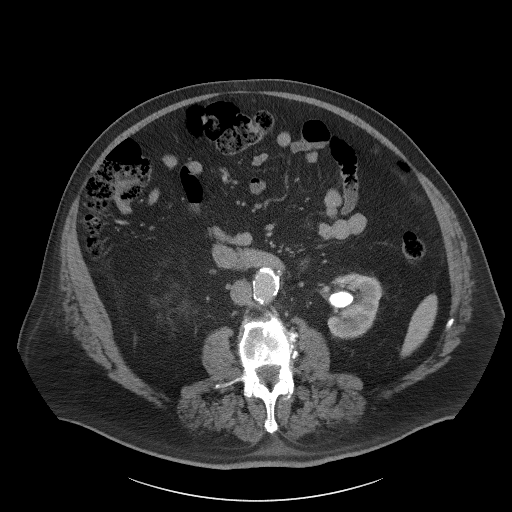
[im 73/104  soft-tissue]
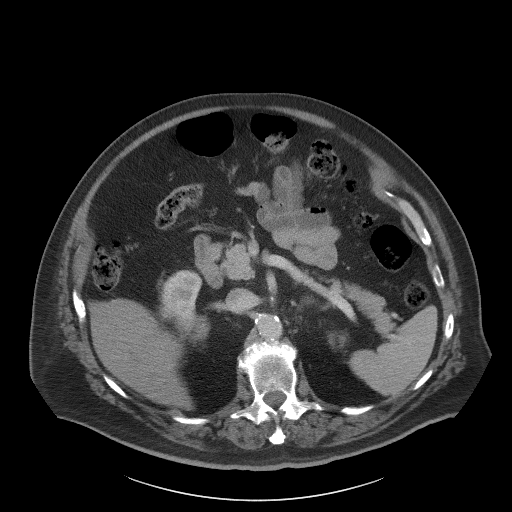
[im 73/104  bone]
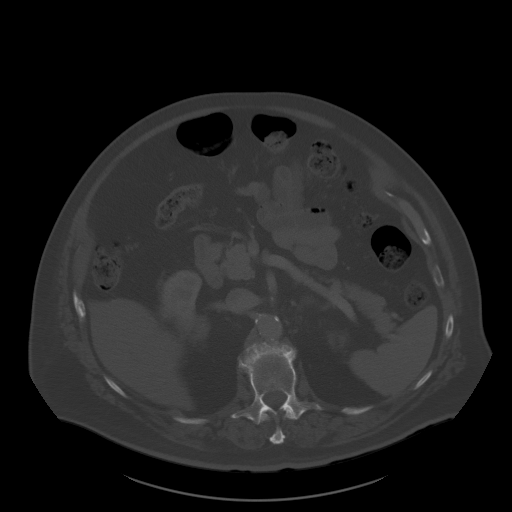
[im 83/104  soft-tissue]
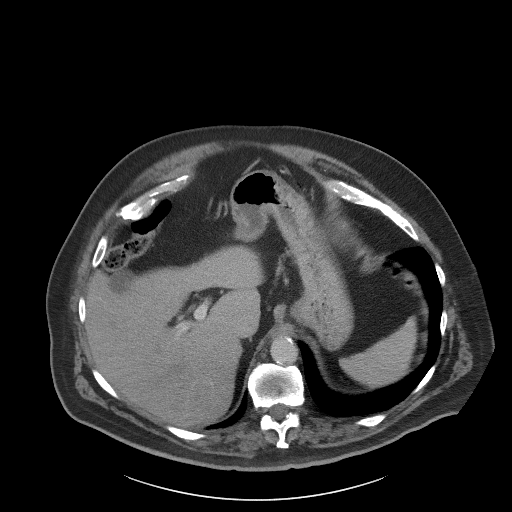
[im 88/104  soft-tissue]
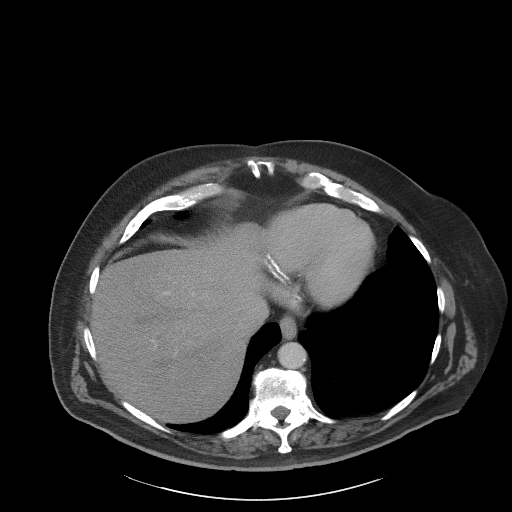
[im 98/104  soft-tissue]
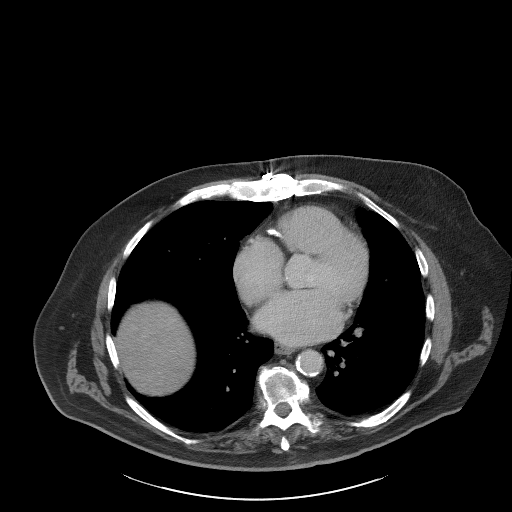

[Series 5: coronal st · coronal · 0.95mm/px · 3 of 129 slices shown]
[im 43/129  soft-tissue]
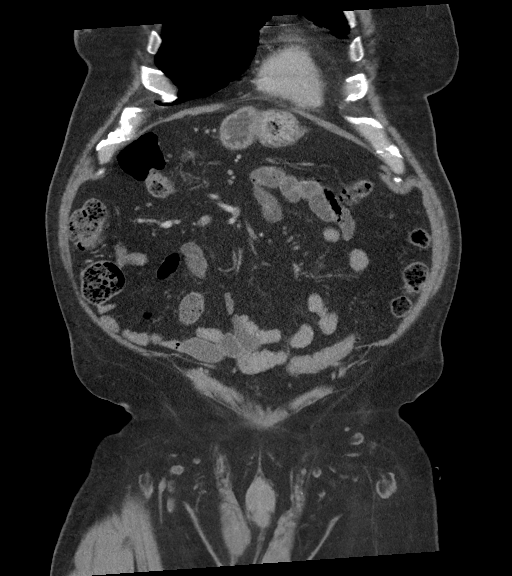
[im 57/129  soft-tissue]
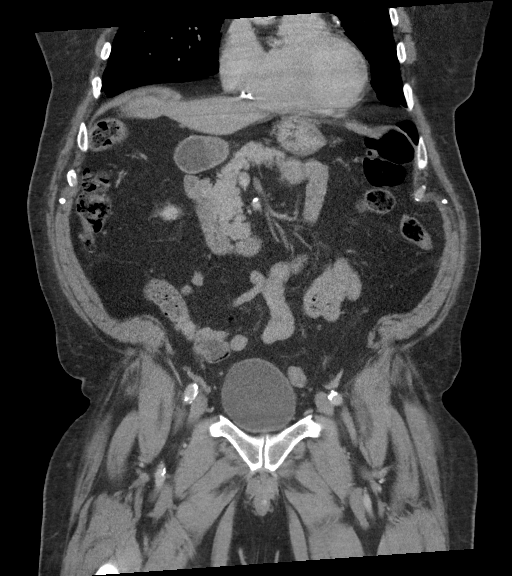
[im 72/129  soft-tissue]
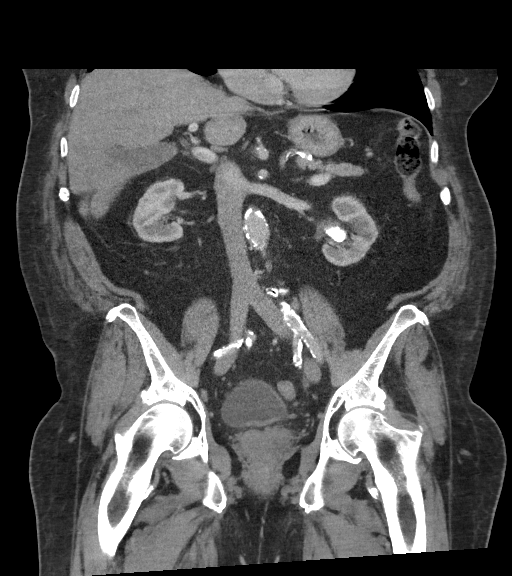

[15 of 46 positions shown; findings below may reference images not displayed]

FINDINGS: Lower chest: No acute abnormality.

Hepatobiliary: Fatty infiltration of the liver is noted. Gallbladder
demonstrates dependent density consistent with small gallstones. No
wall thickening or pericholecystic fluid is noted.

Pancreas: Unremarkable. No pancreatic ductal dilatation or
surrounding inflammatory changes.

Spleen: Normal in size without focal abnormality.

Adrenals/Urinary Tract: Adrenal glands are within normal limits.
Kidneys demonstrate normal enhancement pattern bilaterally. No renal
calculi are noted on the right. No obstructive changes are seen. On
the left is a staghorn type calculus which extends from the renal
pelvis into the lower pole caliceal system and measures
approximately 3.5 cm in greatest length. This is not appear to be
obstructive in nature. The left ureter appears within normal limits
bladder is well distended. A small area of enhancement is noted near
the insertion site of the left ureter early in the bolus. It would
be difficult to exclude a focal lesion. Nonemergent urological
workup is recommended.

Stomach/Bowel: Stomach is decompressed. Colon shows no obstructive
or inflammatory changes. The appendix is within normal limits.
Proximal small bowel appears within normal limits. There are some
fluid-filled more distal small bowel loops with some mild
fecalization of bowel contents. This is primarily in the right lower
quadrant. Possibility of partial small bowel obstruction or ileus
could not be totally excluded.

Vascular/Lymphatic: Atherosclerotic calcifications of the abdominal
aorta are seen. Mild dilatation of the infrarenal aorta is noted to
3 cm similar to that seen on prior ultrasound examination. No
significant lymphadenopathy is noted.

Reproductive: Prostate is unremarkable.

Other: No abdominal wall hernia or abnormality. No abdominopelvic
ascites.

Musculoskeletal: Degenerative changes of lumbar spine are noted.
IMPRESSION: 1. Staghorn type calculus in the left kidney as described. No
obstructive changes are seen.
2. Small area of enhancement near the insertion site of the left
ureter early in the bolus. It would be difficult to exclude a focal
lesion. Nonemergent urological workup is recommended.
3. Fluid-filled more distal small bowel loops with some fecalization
of bowel contents. Possibility of partial small bowel obstruction or
ileus could not be totally excluded.
Correlate with physical exam.
4. Cholelithiasis without complicating factors.
5. Fatty liver.
6. Stable dilatation of the infrarenal aorta. Recommend follow-up
ultrasound every 3 years. This recommendation follows ACR consensus
guidelines: White Paper of the ACR Incidental Findings Committee II

Aortic aneurysm NOS ([WZ]-[WZ]).

## 2020-06-25 MED ORDER — FENTANYL CITRATE (PF) 100 MCG/2ML IJ SOLN
50.0000 ug | Freq: Once | INTRAMUSCULAR | Status: AC
  Administered 2020-06-25: 50 ug via INTRAVENOUS
  Filled 2020-06-25: qty 2

## 2020-06-25 MED ORDER — LIDOCAINE 5 % EX PTCH: 1.0000 | MEDICATED_PATCH | Freq: Two times a day (BID) | CUTANEOUS | 0 refills | Status: DC

## 2020-06-25 MED ORDER — IOHEXOL 350 MG/ML SOLN
75.0000 mL | Freq: Once | INTRAVENOUS | Status: AC | PRN
  Administered 2020-06-25: 75 mL via INTRAVENOUS
  Filled 2020-06-25: qty 75

## 2020-06-25 MED ORDER — OXYCODONE-ACETAMINOPHEN 5-325 MG PO TABS
1.0000 | ORAL_TABLET | Freq: Four times a day (QID) | ORAL | 0 refills | Status: DC | PRN
Start: 2020-06-25 — End: 2020-07-01

## 2020-06-25 MED ORDER — LIDOCAINE 5 % EX PTCH
1.0000 | MEDICATED_PATCH | CUTANEOUS | Status: DC
  Administered 2020-06-25: 1 via TRANSDERMAL
  Filled 2020-06-25: qty 1

## 2020-06-25 MED ORDER — BACITRACIN-NEOMYCIN-POLYMYXIN 400-5-5000 EX OINT
TOPICAL_OINTMENT | Freq: Once | CUTANEOUS | Status: AC
  Administered 2020-06-25: 1 via TOPICAL
  Filled 2020-06-25: qty 1

## 2020-06-25 MED ORDER — SODIUM CHLORIDE 0.9 % IV SOLN: Freq: Once | INTRAVENOUS | Status: DC

## 2020-06-25 NOTE — ED Triage Notes (Signed)
Pt here with back pain that started on last Sunday. Pt went to UC and was prescribed pain relievers and muscle relaxer with no relief. Pt NAD but screaming in pain when moving in triage.

## 2020-06-25 NOTE — ED Provider Notes (Signed)
[facility] Emergency Department Provider Note   ____________________________________________   First MD Initiated Contact with Patient 06/25/20 1445     (approximate)  I have reviewed the triage vital signs and the nursing notes.   HISTORY  Chief Complaint Back Pain   HPI [name] [name] is a 79 y.o. male presents to the ED with complaint of continued low back pain.  Patient was seen at [facility] acute care on 06/17/2020 at which time he was prescribed Skelaxin, meloxicam and hydrocodone 5 mg.  Originally he states that the left-sided back pain began after picking up a basket of wet close and feeling pain in his back.  Patient states that he has had low back pain from lifting when he was working.  He denies any nausea, vomiting or constipation.  He had a normal bowel movement this morning.  Patient has taken 2 hydrocodone since 8:00 AM without any relief of his pain.  This morning he states that his back pain increased when he bent over to wash his leg in the shower and had an immediate pulling sensation in the left lower part of his back.  Patient over the weekend has been using the heating pad and actually has a burn to the left side of his back due to falling asleep.  Patient denies any incontinence of bowel or bladder or paresthesia.  He rates his pain as 10/10.         Past Medical History:  Diagnosis Date  . AAA (abdominal aortic aneurysm) (HCC)   . ACE inhibitor associated hyperkalemia 09/20/2015  . Arthritis   . CAD (coronary artery disease)    a. 03/2014 NSTEMI--> 05/2014 CABG x 5 (LIMA->D1, VG->LAD, VG->OM1, VG->OM2, VG->RPDA);  c. 04/2015 Cath: LM nl, LAD 100ost, 40d, RI 80, LCX 80p/m, OM1 70, OM2 80, RCA 20p, 27m, 90d, VG->dLAD min irregs, VG->OM1 100, VG->OM2 100, VG->RPDA nl, LIMA->D2 nl.  . Chronic systolic CHF (congestive heart failure) (HCC)    a. EF 25-30% by 2D ECHO 04/20/14. Severely dec global hypokinesis. mildly elevated RVSP;  b.  12/2014 Echo: EF 40-45%, mod dil LA, mildly dil RV/RA;  c. 04/2015 Echo: EF 30-35%, sev antsept HK, Gr1 DD, mildly dil RA/LA;  d. 07/2015 Echo: EF 35-40%, ant/antsept HK, Gr1 DD, mildly red RV fxn.  . Diabetes mellitus without complication (HCC)   . Epigastric hernia   . Frequent PVCs    a. 24 hr Holter 06/2015: >28K PVCs accounting for 25% of all beats, rare PAC-->mexilitene started;  b. 07/2015 Holter: 1700 PVC's/24 hrs.  [name] GERD (gastroesophageal reflux disease)   . Gout   . Hyperlipidemia   . Hypertension   . Ischemic cardiomyopathy    a. EF 25-30% 03/2014;  b. EF 40-45% 12/2014.  . Mitral regurgitation    a. 03/2014 TEE: mild to mod MR.  . Nephrolithiasis   . Neuromuscular disorder (HCC)    DIABETIC NEUROPATHY  . Obesity   . Umbilical hernia     Patient Active Problem List   Diagnosis Date Noted  . A-fib (HCC) 01/01/2020  . Arthritis 05/13/2016  . Kidney stones 05/13/2016  . Renal cyst 05/13/2016  . Thoracic spine fracture (HCC) 05/13/2016  . Ventral hernia 05/13/2016  . Type 2 diabetes mellitus with diabetic polyneuropathy, without long-term current use of insulin (HCC) 03/03/2016  . Vertigo 03/03/2016  . Low back pain 03/03/2016  . Problems with swallowing and mastication   . Gastritis   . Other specified diseases of  esophagus   . Morbid obesity with BMI of 40.0-44.9, adult (HCC) 11/27/2015  . ACE inhibitor associated hyperkalemia 09/20/2015  . Weakness of hand -bilateral 09/20/2015  . Cardiomyopathy, ischemic 06/13/2015  . Coronary artery disease involving native coronary artery with other forms of angina pectoris   . AAA (abdominal aortic aneurysm) (HCC) 04/19/2015  . Right foot ulcer (HCC) 01/16/2015  . 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 CAD (coronary artery disease)   . Chronic systolic CHF (congestive heart failure) (HCC)   . Obesity   . Gout   . Hyperlipidemia   . Hypertension   . Diastasis, muscle 01/12/2014  . Umbilical hernia 01/12/2014  . Epigastric hernia  01/12/2014  . Morbid obesity (HCC) 01/12/2014    Past Surgical History:  Procedure Laterality Date  . CARDIAC CATHETERIZATION     [facility]  . CARDIAC CATHETERIZATION N/A 05/23/2015   Procedure: Left Heart Cath and Coronary Angiography;  Surgeon: Iran Ouch, MD;  Location: [facility] INVASIVE CV LAB;  Service: Cardiovascular;  Laterality: N/A;  . CARDIOVERSION N/A 01/30/2020   Procedure: CARDIOVERSION;  Surgeon: Iran Ouch, MD;  Location: [facility] ORS;  Service: Cardiovascular;  Laterality: N/A;  . CARPAL TUNNEL RELEASE Right   . CORONARY ARTERY BYPASS GRAFT N/A 06/06/2014   Procedure: CORONARY ARTERY BYPASS GRAFTING (CABG) x 5 USING LEFT AND RIGHT SAPHENOUS LEG VEIN HARVESTED ENDOSCOPICALLY;  Surgeon: Kerin Perna, MD;  Location: [facility] OR;  Service: Open Heart Surgery;  Laterality: N/A;  . ESOPHAGOGASTRODUODENOSCOPY (EGD) WITH PROPOFOL N/A 02/11/2016   Procedure: ESOPHAGOGASTRODUODENOSCOPY (EGD) WITH PROPOFOL with dialation;  Surgeon: [name], MD;  Location: [facility];  Service: Endoscopy;  Laterality: N/A;  diabetic, oral meds  . INTRAOPERATIVE TRANSESOPHAGEAL ECHOCARDIOGRAM N/A 06/06/2014   Procedure: INTRAOPERATIVE TRANSESOPHAGEAL ECHOCARDIOGRAM;  Surgeon: Kerin Perna, MD;  Location: [facility] OR;  Service: Open Heart Surgery;  Laterality: N/A;  . ROTATOR CUFF REPAIR Right 1996,1997,2000  . TEE WITHOUT CARDIOVERSION N/A 04/24/2014   Procedure: TRANSESOPHAGEAL ECHOCARDIOGRAM (TEE);  Surgeon: [name], MD;  Location: [facility] ENDOSCOPY;  Service: Cardiovascular;  Laterality: N/A;    Prior to Admission medications   Medication Sig Start Date End Date Taking? Authorizing Provider  aspirin EC 81 MG tablet Take 1 tablet (81 mg total) by mouth daily. 09/20/15   Duke Salvia, MD  atorvastatin (LIPITOR) 40 MG tablet TAKE 1 TABLET BY MOUTH ONCE DAILY AT 6 PM. 04/16/20   Iran Ouch, MD  carvedilol (COREG) 12.5 MG tablet TAKE 1 TABLET BY MOUTH TWICE DAILY WITH A MEAL 05/14/20   Iran Ouch, MD  clotrimazole-betamethasone (LOTRISONE) cream Apply 1 application topically 2 (two) times daily as needed (skin irritation/breakouts).  12/06/19   [provider]  colchicine 0.6 MG tablet Take 0.6 mg by mouth daily as needed (for gout flareup).     [provider]  ELIQUIS 5 MG TABS tablet Take 1 tablet by mouth twice daily 05/02/20   Dunn, Raymon Mutton, PA-C  furosemide (LASIX) 40 MG tablet Take 1 tablet by mouth once daily 02/09/20   Iran Ouch, MD  gabapentin (NEURONTIN) 300 MG capsule Take 300 mg by mouth 4 (four) times daily.  01/02/14   [provider]  lidocaine (LIDODERM) 5 % Place 1 patch onto the skin every 12 (twelve) hours. Remove & Discard patch within 12 hours or as directed by MD 06/25/20 06/25/21  Bridget Hartshorn L, PA-C  mexiletine (MEXITIL) 150 MG capsule Take 2 capsules by mouth twice daily Patient taking  differently: Take 150 mg by mouth in the morning, at noon, in the evening, and at bedtime.  11/15/19   Iran Ouch, MD  nitroGLYCERIN (NITROSTAT) 0.4 MG SL tablet Place 1 tablet (0.4 mg total) under the tongue every 5 (five) minutes as needed for chest pain. 08/31/17   Dunn, Raymon Mutton, PA-C  oxyCODONE-acetaminophen (PERCOCET) 5-325 MG tablet Take 1 tablet by mouth every 6 (six) hours as needed for severe pain. 06/25/20   Tommi Rumps, PA-C  valsartan (DIOVAN) 40 MG tablet Take 1 tablet by mouth once daily 04/02/20   Iran Ouch, MD    Allergies Glimepiride  Family History  Problem Relation Age of Onset  . Throat cancer Father   . Stroke Father   . Colon polyps Brother   . Lung cancer Brother   . Prostate cancer Neg Hx   . Bladder Cancer Neg Hx   . Kidney cancer Neg Hx     Social History Social History   Tobacco Use  . Smoking status: Former Smoker    Packs/day: 1.00    Years: 25.00    Pack years: 25.00    Types: Cigarettes  . Smokeless tobacco: Former Neurosurgeon    Types: Chew  . Tobacco comment: quit early 1990's    Substance Use Topics  . Alcohol use: No  . Drug use: No    Review of Systems Constitutional: No fever/chills Eyes: No visual changes. Cardiovascular: Denies chest pain. Respiratory: Denies shortness of breath. Gastrointestinal: No abdominal pain.  No nausea, no vomiting.  No diarrhea.  No constipation. Genitourinary: Negative for dysuria. Musculoskeletal: Positive for low back pain. Skin: Positive for burn on back. Neurological: Negative for headaches, focal weakness or numbness. ____________________________________________   PHYSICAL EXAM:  VITAL SIGNS: ED Triage Vitals [06/25/20 1348]  Enc Vitals Group     BP 135/71     Pulse Rate 81     Resp 18     Temp 97.7 F (36.5 C)     Temp Source Oral     SpO2 98 %     Weight 250 lb (113.4 kg)     Height 5\' 9"  (1.753 m)     Head Circumference      Peak Flow      Pain Score 10     Pain Loc      Pain Edu?      Excl. in GC?     Constitutional: Alert and oriented. Well appearing and in no acute distress. Eyes: Conjunctivae are normal.  Head: Atraumatic. Nose: No congestion/rhinnorhea. Neck: No stridor.   Cardiovascular: Normal rate, regular rhythm. Grossly normal heart sounds.  Good peripheral circulation. Respiratory: Normal respiratory effort.  No retractions. Lungs CTAB. Gastrointestinal: Soft and nontender. No distention. No abdominal bruits.  Bowel sounds normoactive x4 quadrants. Musculoskeletal: On examination of the thoracic and lumbar spine there is no gross deformity however there is moderate tenderness on palpation of the lower lumbar area and left paravertebral muscles.  Range of motion is difficult for the patient secondary to pain.  On the left lateral aspect of the lumbar area there is a linear partial-thickness burn that appears to be healing without any signs of infection at this time.  This is due to patient falling asleep using a heating pad.  There is no active drainage from this area.  Nontender to palpate  the burned area.   Neurologic:  Normal speech and language. No gross focal neurologic deficits are appreciated.  Skin:  Skin  is warm, dry.  4 cm linear burn as noted above. Psychiatric: Mood and affect are normal. Speech and behavior are normal.  ____________________________________________   LABS (all labs ordered are listed, but only abnormal results are displayed)  Labs Reviewed  COMPREHENSIVE METABOLIC PANEL - Abnormal; Notable for the following components:      Result Value   Glucose, Bld 103 (*)    BUN 36 (*)    Creatinine, Ser 1.79 (*)    GFR, Estimated 39 (*)    All other components within normal limits  URINALYSIS, COMPLETE (UACMP) WITH MICROSCOPIC - Abnormal; Notable for the following components:   Color, Urine COLORLESS (*)    APPearance CLEAR (*)    Hgb urine dipstick MODERATE (*)    All other components within normal limits  CBC WITH DIFFERENTIAL/PLATELET     RADIOLOGY I, Tommi Rumps, personally viewed and evaluated these images (plain radiographs) as part of my medical decision making, as well as reviewing the written report by the radiologist.    Official radiology report(s): DG Lumbar Spine 2-3 Views  Result Date: 06/25/2020 CLINICAL DATA:  Back pain for 1 week EXAM: LUMBAR SPINE - 2-3 VIEW COMPARISON:  None. FINDINGS: Frontal and lateral views of the lumbar spine demonstrate 5 non-rib-bearing lumbar type vertebral bodies with mild right convex curvature centered at L2. Otherwise alignment is anatomic. There is severe diffuse spondylosis with disc space narrowing and osteophyte formation at all levels, greatest at the lumbosacral junction. There is diffuse facet hypertrophy, greatest at L4-5 and L5-S1. No fractures. Sacroiliac joints are normal. Extensive atherosclerosis of the abdominal aorta. IMPRESSION: 1. 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 2. No acute fracture. 3. Extensive  aortic atherosclerosis. Electronically Signed   By: Sharlet Salina M.D.   On: 06/25/2020 15:07   CT ABDOMEN PELVIS W CONTRAST  Result Date: 06/25/2020 CLINICAL DATA:  Low back pain EXAM: CT ABDOMEN AND PELVIS WITH CONTRAST TECHNIQUE: Multidetector CT imaging of the abdomen and pelvis was performed using the standard protocol following bolus administration of intravenous contrast. CONTRAST:  35mL OMNIPAQUE IOHEXOL 350 MG/ML SOLN COMPARISON:  11/15/2019 FINDINGS: Lower chest: No acute abnormality. Hepatobiliary: Fatty infiltration of the liver is noted. Gallbladder demonstrates dependent density consistent with small gallstones. No wall thickening or pericholecystic fluid is noted. Pancreas: Unremarkable. No pancreatic ductal dilatation or surrounding inflammatory changes. Spleen: Normal in size without focal abnormality. Adrenals/Urinary Tract: Adrenal glands are within normal limits. Kidneys demonstrate normal enhancement pattern bilaterally. No renal calculi are noted on the right. No obstructive changes are seen. On the left is a staghorn type calculus which extends from the renal pelvis into the lower pole caliceal system and measures approximately 3.5 cm in greatest length. This is not appear to be obstructive in nature. The left ureter appears within normal limits bladder is well distended. A small area of enhancement is noted near the insertion site of the left ureter early in the bolus. It would be difficult to exclude a focal lesion. Nonemergent urological workup is recommended. Stomach/Bowel: Stomach is decompressed. Colon shows no obstructive or inflammatory changes. The appendix is within normal limits. Proximal small bowel appears within normal limits. There are some fluid-filled more distal small bowel loops with some mild fecalization of bowel contents. This is primarily in the right lower quadrant. Possibility of partial small bowel obstruction or ileus could not be totally excluded.  Vascular/Lymphatic: Atherosclerotic calcifications of the abdominal aorta are seen. Mild dilatation  of the infrarenal aorta is noted to 3 cm similar to that seen on prior ultrasound examination. No significant lymphadenopathy is noted. Reproductive: Prostate is unremarkable. Other: No abdominal wall hernia or abnormality. No abdominopelvic ascites. Musculoskeletal: Degenerative changes of lumbar spine are noted. IMPRESSION: 1. 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 No obstructive changes are seen. 2. Small area of enhancement near the insertion site of the left ureter early in the bolus. It would be difficult to exclude a focal lesion. Nonemergent urological workup is recommended. 3. Fluid-filled more distal small bowel loops with some fecalization of bowel contents. Possibility of partial small bowel obstruction or ileus could not be totally excluded. Correlate with physical exam. 4. Cholelithiasis without complicating factors. 5. Fatty liver. 6. Stable dilatation of the infrarenal aorta. Recommend follow-up ultrasound every 3 years. This recommendation follows ACR consensus guidelines: White Paper of the ACR Incidental Findings Committee II on Vascular Findings. J Am Coll Radiol 2013; 10:789-794. Aortic aneurysm NOS (ICD10-I71.9). Electronically Signed   By: Alcide Clever M.D.   On: 06/25/2020 17:03    ____________________________________________   PROCEDURES  Procedure(s) performed (including Critical Care):  Procedures   ____________________________________________   INITIAL IMPRESSION / ASSESSMENT AND PLAN / ED COURSE  As part of my medical decision making, I reviewed the following data within the electronic MEDICAL RECORD NUMBER Notes from prior ED visits and [place] Controlled Substance Database  79 year old male presents to the ED with continued back pain.  He was seen at [facility] acute care where he was prescribed hydrocodone and Skelaxin for "a pulled muscle" when he lifted  a basket of wet clothes.  Patient states that the medication has not helped with his pain.  He also states that while in the shower he bent over to wash his leg and began having pain again.  He also has been using the heating pad and fell asleep causing a burn to his back.  He denies any incontinence of bowel or bladder or saddle anesthesias to suggest cauda equina.  Urinalysis showed hematuria and lab work showed a BUN of 36 and creatinine of 1.79.  Manger of his lab work was normal.  Lumbar spine showed severe diffuse spondylosis but no acute bony injury.  CT abdomen and pelvis showed stable dilation of the infra renal aorta.  Renal calculi was also noted.  Patient was given IV fentanyl while in the emergency department and states that he did get relief of his pain.  IV fluids were ordered however patient stated that he no longer wanted to stay in the emergency department and wanted to get pain medication and a wheelchair to go home.  A Lidoderm patch was applied to his back as well as a Neosporin dressing to the burn.  He was instructed to discontinue taking the hydrocodone.  A prescription for Percocet was sent to his pharmacy and patient was made aware that this may cause drowsiness and increase his risk for falling especially if he takes the medication with Skelaxin as previously prescribed.  A prescription for Lidoderm patches was also sent.  Patient was given information about cleaning the burn on his back and watch for any signs of infection.  He is to follow-up with urologist if he already has 1 and if not a urologist was listed on his discharge papers.  He is to follow-up with his PCP if any continued problems with his back and encouraged to return to the emergency department if any severe worsening of his  symptoms or urgent concerns.   ____________________________________________   FINAL CLINICAL IMPRESSION(S) / ED DIAGNOSES  Final diagnoses:  Acute left-sided low back pain without sciatica      ED Discharge Orders         Ordered    oxyCODONE-acetaminophen (PERCOCET) 5-325 MG tablet  Every 6 hours PRN        06/25/20 1758    lidocaine (LIDODERM) 5 %  Every 12 hours        06/25/20 1801          *Please note:  [name] [name] was evaluated in Emergency Department on 06/25/2020 for the symptoms described in the history of present illness. He was evaluated in the context of the global COVID-19 pandemic, which necessitated consideration that the patient might be at risk for infection with the SARS-CoV-2 virus that causes COVID-19. Institutional protocols and algorithms that pertain to the evaluation of patients at risk for COVID-19 are in a state of rapid change based on information released by regulatory bodies including the CDC and federal and state organizations. These policies and algorithms were followed during the patient's care in the ED.  Some ED evaluations and interventions may be delayed as a result of limited staffing during and the pandemic.*   Note:  This document was prepared using Dragon voice recognition software and may include unintentional dictation errors.    Tommi Rumps, PA-C 06/25/20 1829    Gilles Chiquito, MD 06/25/20 [phone]

## 2020-06-25 NOTE — Discharge Instructions (Addendum)
Continue taking the hydrocodone-acetaminophen which is the pain medication that you were given over at [facility].  A prescription for oxycodone was sent to the pharmacy which is stronger than what you are currently taking for pain.  Be aware that this can cause drowsiness and increase your risk for injury by falling.  Also increased drowsiness if you are taking this medication with the muscle relaxant.  Do not use a heating pad on the left side of your back at this time due to your burn.  Continue to watch this area and clean daily with mild soap and water to avoid getting this infected.  Also a pain patch was sent to the pharmacy for you to use on your back which will not cause any drowsiness.  Return to the emergency department if any severe worsening of your symptoms or urgent concerns.  Also the name of a urologist is listed on your discharge papers to follow-up for your kidney functions and kidney stone if you do not already have a urologist to see. Increase fluids tonight and tomorrow

## 2020-06-25 NOTE — ED Notes (Signed)
Pt to ER via EMS with c/o back pain since last week.  Was diagnosed with a pulled muscle last week, pain has increased to 10/10, pt was mildly hypertensive per EMS.

## 2020-07-01 ENCOUNTER — Observation Stay
Admission: EM | Admit: 2020-07-01 | Discharge: 2020-07-02 | Disposition: A | Discharge status: Home / Self Care | Payer: PPO | Attending: Internal Medicine | Admitting: Internal Medicine

## 2020-07-01 ENCOUNTER — Encounter: Payer: Self-pay | Admitting: Emergency Medicine

## 2020-07-01 ENCOUNTER — Emergency Department: Payer: PPO

## 2020-07-01 ENCOUNTER — Other Ambulatory Visit: Payer: Self-pay

## 2020-07-01 DIAGNOSIS — Z7901 Long term (current) use of anticoagulants: Secondary | ICD-10-CM | POA: Insufficient documentation

## 2020-07-01 DIAGNOSIS — L02612 Cutaneous abscess of left foot: Secondary | ICD-10-CM | POA: Diagnosis not present

## 2020-07-01 DIAGNOSIS — E1140 Type 2 diabetes mellitus with diabetic neuropathy, unspecified: Secondary | ICD-10-CM | POA: Diagnosis not present

## 2020-07-01 DIAGNOSIS — N200 Calculus of kidney: Secondary | ICD-10-CM | POA: Diagnosis not present

## 2020-07-01 DIAGNOSIS — I2510 Atherosclerotic heart disease of native coronary artery without angina pectoris: Secondary | ICD-10-CM

## 2020-07-01 DIAGNOSIS — I5022 Chronic systolic (congestive) heart failure: Secondary | ICD-10-CM | POA: Diagnosis not present

## 2020-07-01 DIAGNOSIS — L989 Disorder of the skin and subcutaneous tissue, unspecified: Secondary | ICD-10-CM | POA: Diagnosis present

## 2020-07-01 DIAGNOSIS — Z951 Presence of aortocoronary bypass graft: Secondary | ICD-10-CM | POA: Insufficient documentation

## 2020-07-01 DIAGNOSIS — N1831 Chronic kidney disease, stage 3a: Secondary | ICD-10-CM | POA: Insufficient documentation

## 2020-07-01 DIAGNOSIS — S91302A Unspecified open wound, left foot, initial encounter: Secondary | ICD-10-CM | POA: Diagnosis not present

## 2020-07-01 DIAGNOSIS — Z79899 Other long term (current) drug therapy: Secondary | ICD-10-CM | POA: Diagnosis not present

## 2020-07-01 DIAGNOSIS — I4891 Unspecified atrial fibrillation: Secondary | ICD-10-CM

## 2020-07-01 DIAGNOSIS — I10 Essential (primary) hypertension: Secondary | ICD-10-CM | POA: Diagnosis not present

## 2020-07-01 DIAGNOSIS — Z794 Long term (current) use of insulin: Secondary | ICD-10-CM | POA: Insufficient documentation

## 2020-07-01 DIAGNOSIS — T148XXA Other injury of unspecified body region, initial encounter: Secondary | ICD-10-CM

## 2020-07-01 DIAGNOSIS — Z20822 Contact with and (suspected) exposure to covid-19: Secondary | ICD-10-CM | POA: Insufficient documentation

## 2020-07-01 DIAGNOSIS — L03032 Cellulitis of left toe: Secondary | ICD-10-CM | POA: Diagnosis not present

## 2020-07-01 DIAGNOSIS — M1990 Unspecified osteoarthritis, unspecified site: Secondary | ICD-10-CM | POA: Diagnosis present

## 2020-07-01 DIAGNOSIS — L089 Local infection of the skin and subcutaneous tissue, unspecified: Secondary | ICD-10-CM | POA: Diagnosis not present

## 2020-07-01 DIAGNOSIS — I130 Hypertensive heart and chronic kidney disease with heart failure and stage 1 through stage 4 chronic kidney disease, or unspecified chronic kidney disease: Secondary | ICD-10-CM | POA: Insufficient documentation

## 2020-07-01 DIAGNOSIS — M7732 Calcaneal spur, left foot: Secondary | ICD-10-CM | POA: Diagnosis not present

## 2020-07-01 LAB — SEDIMENTATION RATE: Sed Rate: 46 mm/hr — ABNORMAL HIGH (ref 0–20)

## 2020-07-01 LAB — URINALYSIS, COMPLETE (UACMP) WITH MICROSCOPIC
Bacteria, UA: NONE SEEN
Bilirubin Urine: NEGATIVE
Glucose, UA: NEGATIVE mg/dL
Hgb urine dipstick: NEGATIVE
Ketones, ur: NEGATIVE mg/dL
Leukocytes,Ua: NEGATIVE
Nitrite: NEGATIVE
Protein, ur: NEGATIVE mg/dL
Specific Gravity, Urine: 1.006 (ref 1.005–1.030)
pH: 5 (ref 5.0–8.0)

## 2020-07-01 LAB — CBG MONITORING, ED: Glucose-Capillary: 151 mg/dL — ABNORMAL HIGH (ref 70–99)

## 2020-07-01 LAB — CBC WITH DIFFERENTIAL/PLATELET
Abs Immature Granulocytes: 0.05 10*3/uL (ref 0.00–0.07)
Basophils Absolute: 0.1 10*3/uL (ref 0.0–0.1)
Basophils Relative: 0 %
Eosinophils Absolute: 0.3 10*3/uL (ref 0.0–0.5)
Eosinophils Relative: 2 %
HCT: 39.2 % (ref 39.0–52.0)
Hemoglobin: 12.7 g/dL — ABNORMAL LOW (ref 13.0–17.0)
Immature Granulocytes: 0 %
Lymphocytes Relative: 11 %
Lymphs Abs: 1.5 10*3/uL (ref 0.7–4.0)
MCH: 29.1 pg (ref 26.0–34.0)
MCHC: 32.4 g/dL (ref 30.0–36.0)
MCV: 89.9 fL (ref 80.0–100.0)
Monocytes Absolute: 0.9 10*3/uL (ref 0.1–1.0)
Monocytes Relative: 7 %
Neutro Abs: 10.5 10*3/uL — ABNORMAL HIGH (ref 1.7–7.7)
Neutrophils Relative %: 80 %
Platelets: 215 10*3/uL (ref 150–400)
RBC: 4.36 MIL/uL (ref 4.22–5.81)
RDW: 12.4 % (ref 11.5–15.5)
WBC: 13.3 10*3/uL — ABNORMAL HIGH (ref 4.0–10.5)
nRBC: 0 % (ref 0.0–0.2)

## 2020-07-01 LAB — COMPREHENSIVE METABOLIC PANEL
ALT: 32 U/L (ref 0–44)
AST: 36 U/L (ref 15–41)
Albumin: 3.3 g/dL — ABNORMAL LOW (ref 3.5–5.0)
Alkaline Phosphatase: 86 U/L (ref 38–126)
Anion gap: 13 (ref 5–15)
BUN: 33 mg/dL — ABNORMAL HIGH (ref 8–23)
CO2: 24 mmol/L (ref 22–32)
Calcium: 8.8 mg/dL — ABNORMAL LOW (ref 8.9–10.3)
Chloride: 96 mmol/L — ABNORMAL LOW (ref 98–111)
Creatinine, Ser: 1.41 mg/dL — ABNORMAL HIGH (ref 0.61–1.24)
GFR, Estimated: 51 mL/min — ABNORMAL LOW (ref >60)
Glucose, Bld: 169 mg/dL — ABNORMAL HIGH (ref 70–99)
Potassium: 3.8 mmol/L (ref 3.5–5.1)
Sodium: 133 mmol/L — ABNORMAL LOW (ref 135–145)
Total Bilirubin: 1.1 mg/dL (ref 0.3–1.2)
Total Protein: 6.9 g/dL (ref 6.5–8.1)

## 2020-07-01 LAB — RESPIRATORY PANEL BY RT PCR (FLU A&B, COVID)
Influenza A by PCR: NEGATIVE
Influenza B by PCR: NEGATIVE
SARS Coronavirus 2 by RT PCR: NEGATIVE

## 2020-07-01 LAB — LACTIC ACID, PLASMA
Lactic Acid, Venous: 1.3 mmol/L (ref 0.5–1.9)
Lactic Acid, Venous: 0.8 mmol/L (ref 0.5–1.9)

## 2020-07-01 IMAGING — CR DG FOOT COMPLETE 3+V*L*
3 series · 3 of 3 positions shown · non-contrast
Comparison: [DATE]

CLINICAL DATA: Wound at LEFT fifth metatarsal

EXAM:
LEFT FOOT - COMPLETE 3+ VIEW

[foot ap]
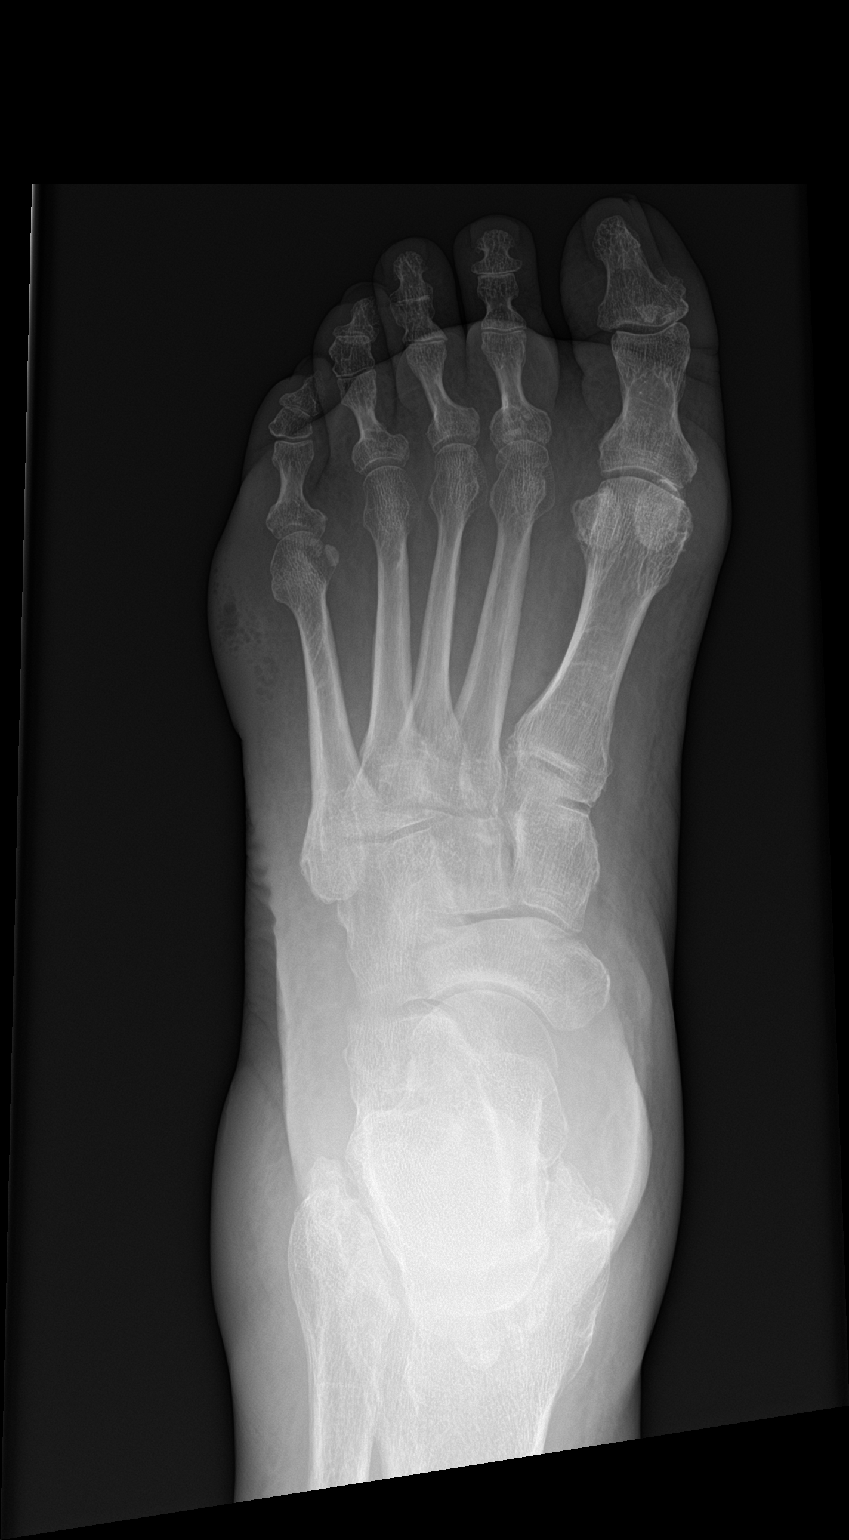

[foot obl]
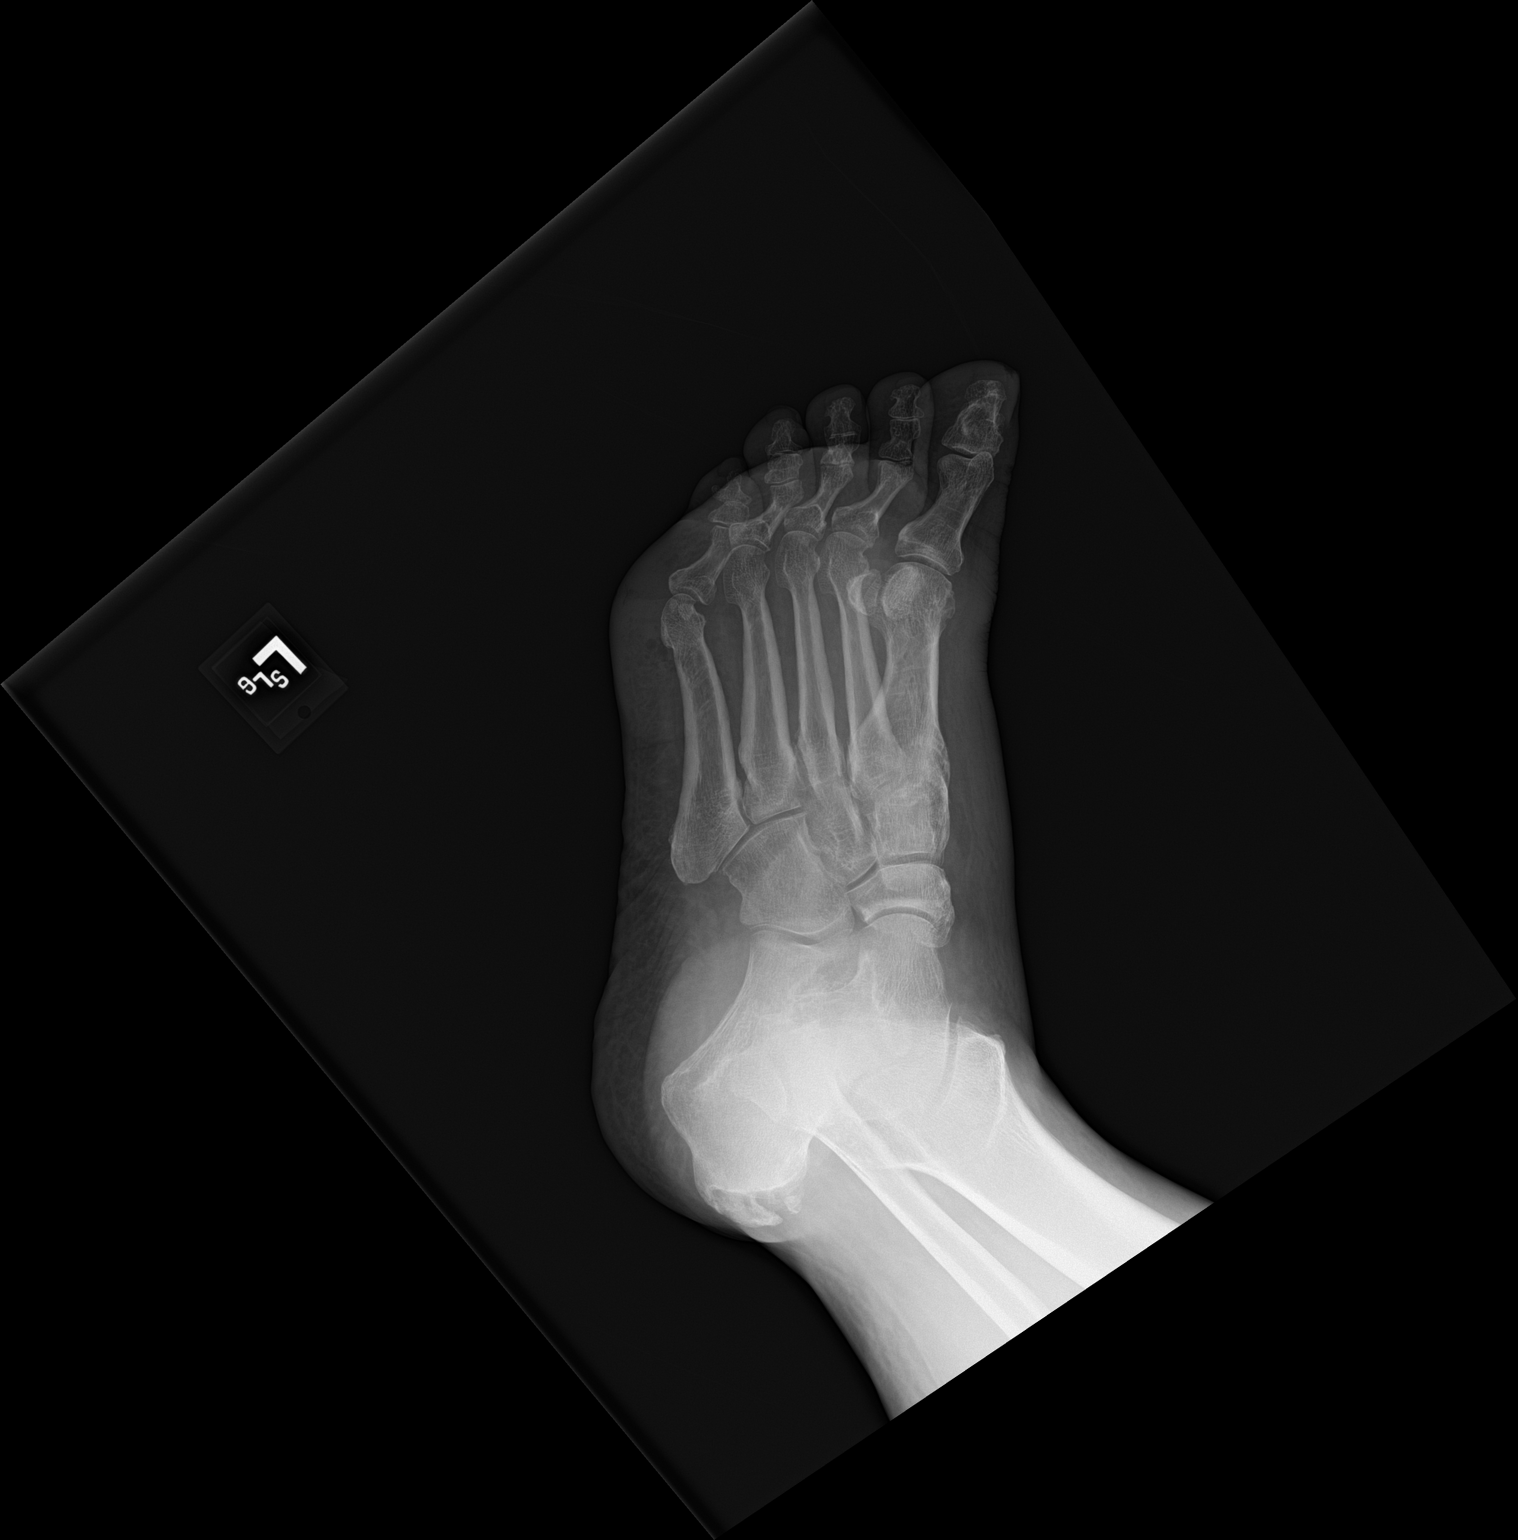

[foot lat]
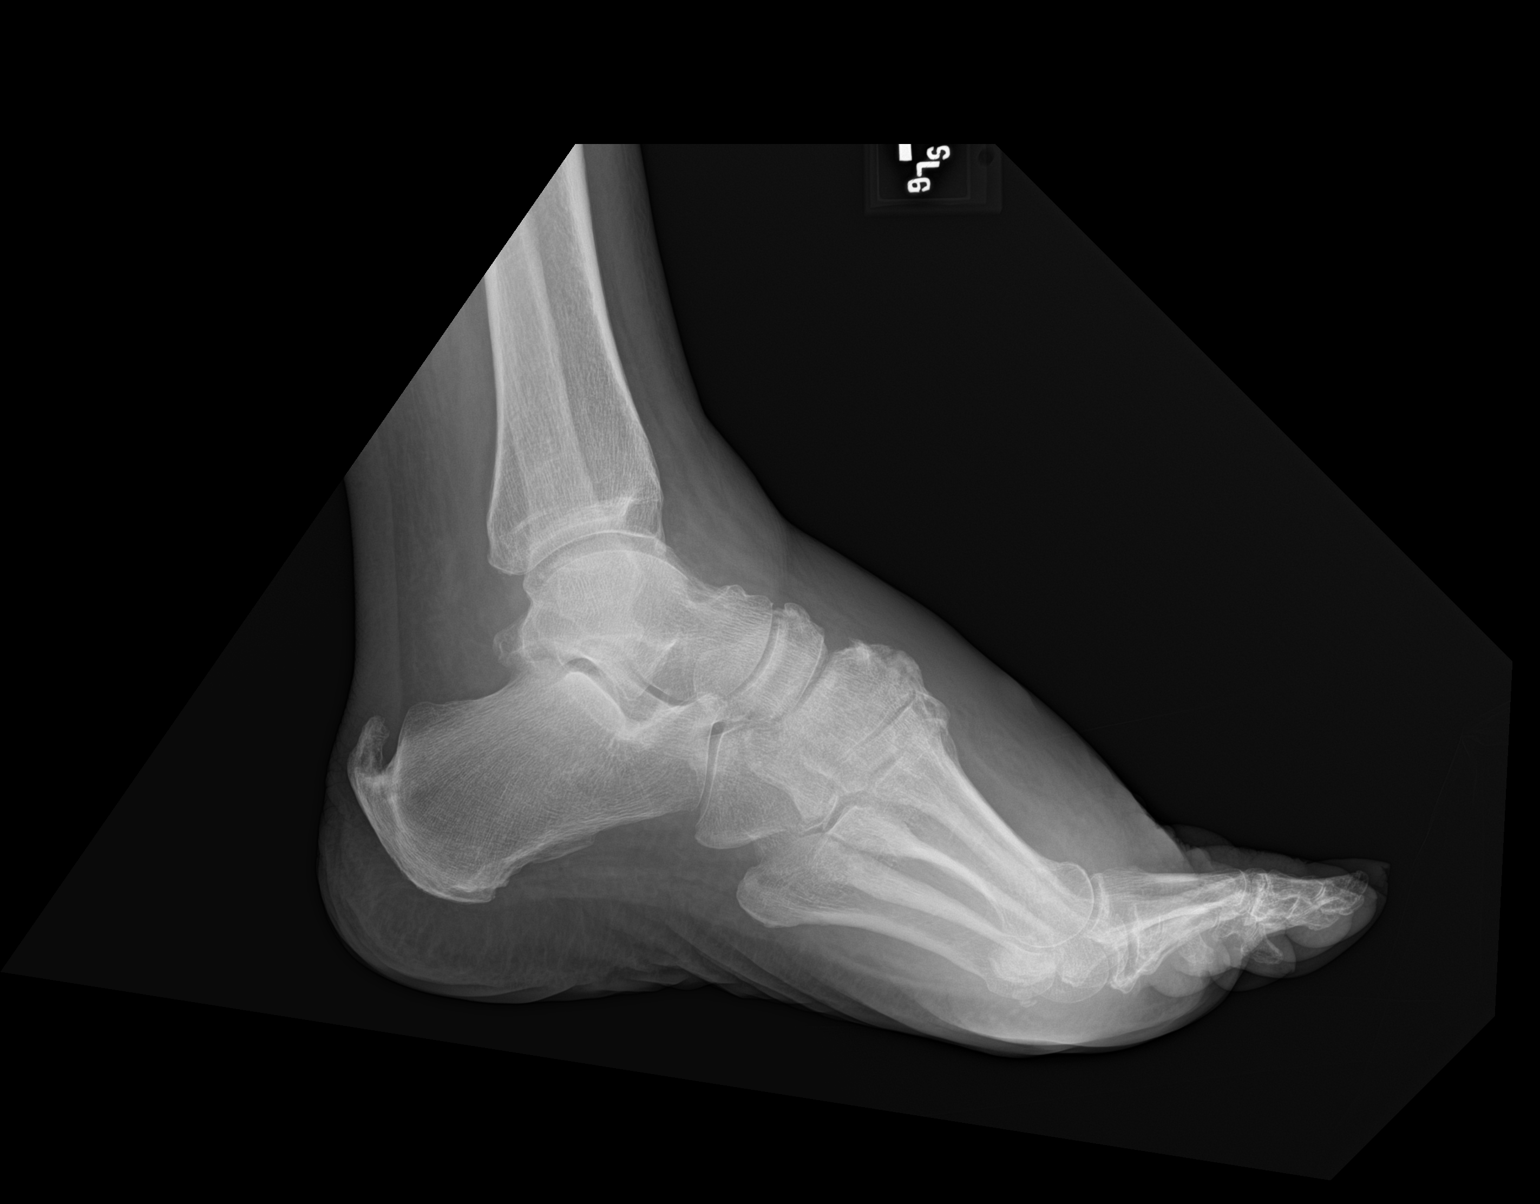

[3 of 3 positions shown; findings below may reference images not displayed]

FINDINGS: Foci of soft tissue gas lateral to the distal LEFT fifth metatarsal.

Diffuse soft tissue swelling LEFT foot.

Osseous demineralization.

Joint spaces preserved.

No acute fracture, dislocation, or bone destruction.

Degenerative changes at first TMT joint.

Bulky Achilles insertion calcaneal spur.
IMPRESSION: No acute osseous abnormalities.

Soft tissue swelling LEFT foot with foci of gas lateral to the
distal fifth metatarsal, question site of ulcer and or infection by
gas-forming organism.

Findings called to Dr. JUMPER on [DATE] at [9R] hrs.

## 2020-07-01 MED ORDER — LACTATED RINGERS IV SOLN: INTRAVENOUS | Status: DC

## 2020-07-01 MED ORDER — ONDANSETRON HCL 4 MG/2ML IJ SOLN: 4.0000 mg | Freq: Four times a day (QID) | INTRAMUSCULAR | Status: DC | PRN

## 2020-07-01 MED ORDER — FENTANYL CITRATE (PF) 100 MCG/2ML IJ SOLN: 12.5000 ug | INTRAMUSCULAR | Status: DC | PRN

## 2020-07-01 MED ORDER — ONDANSETRON HCL 4 MG PO TABS: 4.0000 mg | ORAL_TABLET | Freq: Four times a day (QID) | ORAL | Status: DC | PRN

## 2020-07-01 MED ORDER — VANCOMYCIN HCL IN DEXTROSE 1-5 GM/200ML-% IV SOLN
1000.0000 mg | Freq: Once | INTRAVENOUS | Status: AC
  Administered 2020-07-01: 1000 mg via INTRAVENOUS
  Filled 2020-07-01: qty 200

## 2020-07-01 MED ORDER — VANCOMYCIN HCL 1250 MG/250ML IV SOLN
1250.0000 mg | INTRAVENOUS | Status: DC
  Administered 2020-07-01: 1250 mg via INTRAVENOUS
  Filled 2020-07-01 (×2): qty 250

## 2020-07-01 MED ORDER — SACUBITRIL-VALSARTAN 24-26 MG PO TABS
1.0000 | ORAL_TABLET | Freq: Two times a day (BID) | ORAL | Status: DC
  Administered 2020-07-02: 1 via ORAL
  Filled 2020-07-01 (×2): qty 1

## 2020-07-01 MED ORDER — CARVEDILOL 12.5 MG PO TABS
12.5000 mg | ORAL_TABLET | Freq: Two times a day (BID) | ORAL | Status: DC
  Administered 2020-07-02 (×2): 12.5 mg via ORAL
  Filled 2020-07-01 (×2): qty 1

## 2020-07-01 MED ORDER — ACETAMINOPHEN 325 MG PO TABS: 650.0000 mg | ORAL_TABLET | Freq: Four times a day (QID) | ORAL | Status: DC | PRN

## 2020-07-01 MED ORDER — GABAPENTIN 300 MG PO CAPS
300.0000 mg | ORAL_CAPSULE | Freq: Four times a day (QID) | ORAL | Status: DC
  Administered 2020-07-01 – 2020-07-02 (×3): 300 mg via ORAL
  Filled 2020-07-01 (×3): qty 1

## 2020-07-01 MED ORDER — VANCOMYCIN HCL IN DEXTROSE 1-5 GM/200ML-% IV SOLN: 1000.0000 mg | Freq: Once | INTRAVENOUS | Status: DC

## 2020-07-01 MED ORDER — FUROSEMIDE 40 MG PO TABS: 40.0000 mg | ORAL_TABLET | Freq: Every day | ORAL | Status: DC

## 2020-07-01 MED ORDER — ATORVASTATIN CALCIUM 20 MG PO TABS
40.0000 mg | ORAL_TABLET | Freq: Every day | ORAL | Status: DC
  Administered 2020-07-01: 40 mg via ORAL
  Filled 2020-07-01: qty 2

## 2020-07-01 MED ORDER — IRBESARTAN 75 MG PO TABS
75.0000 mg | ORAL_TABLET | Freq: Every day | ORAL | Status: DC
  Filled 2020-07-01: qty 1

## 2020-07-01 MED ORDER — ACETAMINOPHEN 650 MG RE SUPP: 650.0000 mg | Freq: Four times a day (QID) | RECTAL | Status: DC | PRN

## 2020-07-01 MED ORDER — SODIUM CHLORIDE 0.9 % IV SOLN
2.0000 g | Freq: Once | INTRAVENOUS | Status: AC
  Administered 2020-07-01: 2 g via INTRAVENOUS
  Filled 2020-07-01: qty 20

## 2020-07-01 MED ORDER — NITROGLYCERIN 0.4 MG SL SUBL: 0.4000 mg | SUBLINGUAL_TABLET | SUBLINGUAL | Status: DC | PRN

## 2020-07-01 MED ORDER — LACTATED RINGERS IV BOLUS
500.0000 mL | Freq: Once | INTRAVENOUS | Status: AC
  Administered 2020-07-01: 500 mL via INTRAVENOUS

## 2020-07-01 MED ORDER — FENTANYL CITRATE (PF) 100 MCG/2ML IJ SOLN
12.5000 ug | INTRAMUSCULAR | Status: DC | PRN
  Administered 2020-07-02: 12.5 ug via INTRAVENOUS
  Filled 2020-07-01: qty 2

## 2020-07-01 MED ORDER — ENOXAPARIN SODIUM 40 MG/0.4ML ~~LOC~~ SOLN
40.0000 mg | SUBCUTANEOUS | Status: DC
  Administered 2020-07-01: 40 mg via SUBCUTANEOUS
  Filled 2020-07-01: qty 0.4

## 2020-07-01 NOTE — ED Triage Notes (Addendum)
Pt arrived via w/c from [facility] with reports of foot wound to left foot to the 5th metatarsal. Pt states he has been having back issues and states he has not been able to bathe in a several days due to his pain and states today when he took his socks off after 3 days he noticed drainage and wound. Pt has hx of diabetes.  Pt denies any previous wounds to foot, states he has hx of gout. Swelling noted to L foot.   Pt denies any pain to foot, states he does have feeling in his toes.

## 2020-07-01 NOTE — Progress Notes (Signed)
Following Pt for Code Sepsis

## 2020-07-01 NOTE — H&P (Signed)
History and Physical   [name] [name] OZD:[id number] DOB: [birth year] DOA: 07/01/2020  PCP: [name] Mina, MD Outpatient Specialists: Dr. Lorine [name] is his cardiologist Patient coming from: home,   I have personally briefly reviewed patient's old medical records in [facility] EMR.  Chief Concern: swelling of lower left extremity and wound  HPI: [name] [name] is a 79 y.o. male with medical history significant for afib on eliquis, CAD s/p CABG in 2015, A. fib status post cardioversion on 01/30/2020, hypertension, heart failure reduced ejection fraction, left renal staghorn calculi, chronic low back pain presented to the emergency department at the urging of PCP.  [name] [name] presented via private vehicle.  He states that the noticed right lower foot pain today.  He denies fever, nausea, vomiting, shortness of breath, chest pain, abdominal pain, H/A, vision changes, trouble swallowing, cough, diarrhea, dysuria, hematuria.  He states that he last saw his foot 3 days ago.  3 days ago the foot was fine.  He put on a sock and due to his back pain has been having trouble bathing therefore he has not taken off his sock in 3 days.  He denies new back pain. Persistent left lower back pain. Sometimes it's a 10/10.    Social history: lives by himself, smoked for 10 years, quit in the 1980s, quit etoh in 2015. Denies recreational drug use. 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  ED Course: ED provider discussed.  ED provider requests admission for Celllulitis.  Foot x-ray showed no acute osseous abnormalities.  Soft tissue swelling left foot with foci of gas lateral to the distal fifth metatarsal, question site of ulcer and/or infection by gas-forming organism  Review of Systems: As per HPI otherwise 10 point review of systems negative.  Assessment/Plan  Active Problems:   Cellulitis and abscess of toe of left foot   Abscess of the lateral fifth  metatarsal-present on admission -Sepsis code was called in the ED, on my evaluation of patient's chart and patient's presentation, ED vitals were RR 19-20, heart rate 91, blood pressure 112/62, satting at 98% on room air, negative lactic acid x2, afebrile.  Leukocytosis of 13.3.  His mentation was alert awake x4.  No signs of organ derangement.  Sepsis vitals and labs were negative. -Status post ceftriaxone IV and vancomycin per ED provider and LR IVF -Continue vancomycin pending MRSA results -Podiatry has been consulted for I&D, podiatry will see patient -Check MRSA -N.p.o. at midnight  Left lower thoracic back wound-present on admission -On physical exam, visually has white eschar on red base, negative for physical exam of crepitus or drainage -Wound consult has been placed  CKD 3-at baseline -Creatinine on presentation was 1.41, baseline creatinine is 1.26-1.48 in the last 2 years -Chem-7 in the a.m.  History of coronary artery disease status post CABG in 2015-clinically stable at this time -No chest pain or shortness of breath on presentation -Continue atorvastatin 40 mg daily, carvedilol 12.5 mg p.o. twice daily, entresto 24-26 mg BID -Per cardiology note at [facility], patient's recent cath was in 2016, showing 3 of 5 grafts patent.  He did have occlusion of the vein graft to the OM1 and OM 2.  Native marginals were small and not felt amendable to PCI.  Heart failure reduced ejection fraction-appears dehydrated at this time -Echo on 02/08/2019 showed EF of 35 to 40%, left ventricle demonstrates global hypokinesis, left ventricle diastolic function could not be evaluated.  Right  ventricular systolic function is low normal.  Right ventricular size is normal.  Left atrial size is mildly dilated.  Mitral valve is normal in structure.  No evidence of mitral valve regurgitation.  Aortic valve is grossly normal. -Continue Coreg 12.5 mg twice daily, entresto 24-26 mg BID  -Holding Lasix at this time  as patient appears to be dehydrated -Gave 500 cc of lactated ringer bolus -Primary team to reassess and determine if Lasix is indicated  Atrial fibrillation status post cardioversion -Coreg at home continue -Eliquis is held pending surgical evaluation in the a.m.  Left staghorn type calculus extending from the renal pelvis into the lower pole of the caliceal system-measuring 3.5 cm in greatest length -Per CT read, does not appear to be obstructive in nature, left ureter appears within normal limits, bladder is well distended, -Patient likely benefit from urological work-up outpatient -He denies dysuria, hematuria, urgency, increased frequency -UA from 06/25/2020 and 07/01/2020 were negative for leukocytes and nitrates  Bilateral lower back pain- chronic in nature, no changes to symptoms, fentanyl 12.5 mcg IV every 4 hours as needed for back pain  Hyperglycemia-known diagnosis of diabetes mellitus in chart, in setting of chronic neuropathy, hypertension, truncal obesity, we will check for A1c  Diarrhea-persistent, check C. difficile Cholelithiasis-stable  Medical reconciliation - AM pharmacy team and provider to perform medical reconciliation - Patient states he is taking entresto (he knows because it is expensive), and fluid pill and blood pressure medications - He is unsure of which blood pressure medications it is - Per care everywhere he is on mexiletine 200 mg 4x/day, however patient states he does not know of a medication that he takes 4x per day. The medication did not pull in [name] med list but was pulled via [facility] med list. Pharmacy consult placed and is aware  DVT prophylaxis: Enoxaparin Code Status: Full code Diet: Heart healthy, n.p.o. after midnight Family Communication: Patient can update friend Disposition Plan: Pending clinical course Consults called: Podiatry Admission status: Observation with telemetry  Past Medical History:  Diagnosis Date  . AAA (abdominal  aortic aneurysm) (HCC)   . ACE inhibitor associated hyperkalemia 09/20/2015  . Arthritis   . CAD (coronary artery disease)    a. 03/2014 NSTEMI--> 05/2014 CABG x 5 (LIMA->D1, VG->LAD, VG->OM1, VG->OM2, VG->RPDA);  c. 04/2015 Cath: LM nl, LAD 100ost, 40d, RI 80, LCX 80p/m, OM1 70, OM2 80, RCA 20p, 71m, 90d, VG->dLAD min irregs, VG->OM1 100, VG->OM2 100, VG->RPDA nl, LIMA->D2 nl.  . Chronic systolic CHF (congestive heart failure) (HCC)    a. EF 25-30% by 2D ECHO 04/20/14. Severely dec global hypokinesis. mildly elevated RVSP;  b. 12/2014 Echo: EF 40-45%, mod dil LA, mildly dil RV/RA;  c. 04/2015 Echo: EF 30-35%, sev antsept HK, Gr1 DD, mildly dil RA/LA;  d. 07/2015 Echo: EF 35-40%, ant/antsept HK, Gr1 DD, mildly red RV fxn.  . Diabetes mellitus without complication (HCC)   . Epigastric hernia   . Frequent PVCs    a. 24 hr Holter 06/2015: >28K PVCs accounting for 25% of all beats, rare PAC-->mexilitene started;  b. 07/2015 Holter: 1700 PVC's/24 hrs.  [name] GERD (gastroesophageal reflux disease)   . Gout   . Hyperlipidemia   . Hypertension   . Ischemic cardiomyopathy    a. EF 25-30% 03/2014;  b. EF 40-45% 12/2014.  . Mitral regurgitation    a. 03/2014 TEE: mild to mod MR.  . Nephrolithiasis   . Neuromuscular disorder (HCC)    DIABETIC NEUROPATHY  . Obesity   .  Umbilical hernia    Past Surgical History:  Procedure Laterality Date  . CARDIAC CATHETERIZATION     [facility]  . CARDIAC CATHETERIZATION N/A 05/23/2015   Procedure: Left Heart Cath and Coronary Angiography;  Surgeon: Iran Ouch, MD;  Location: [facility] INVASIVE CV LAB;  Service: Cardiovascular;  Laterality: N/A;  . CARDIOVERSION N/A 01/30/2020   Procedure: CARDIOVERSION;  Surgeon: Iran Ouch, MD;  Location: [facility] ORS;  Service: Cardiovascular;  Laterality: N/A;  . CARPAL TUNNEL RELEASE Right   . CORONARY ARTERY BYPASS GRAFT N/A 06/06/2014   Procedure: CORONARY ARTERY BYPASS GRAFTING (CABG) x 5 USING LEFT AND RIGHT SAPHENOUS LEG VEIN HARVESTED  ENDOSCOPICALLY;  Surgeon: Kerin Perna, MD;  Location: [facility] OR;  Service: Open Heart Surgery;  Laterality: N/A;  . ESOPHAGOGASTRODUODENOSCOPY (EGD) WITH PROPOFOL N/A 02/11/2016   Procedure: ESOPHAGOGASTRODUODENOSCOPY (EGD) WITH PROPOFOL with dialation;  Surgeon: [name], MD;  Location: [facility];  Service: Endoscopy;  Laterality: N/A;  diabetic, oral meds  . INTRAOPERATIVE TRANSESOPHAGEAL ECHOCARDIOGRAM N/A 06/06/2014   Procedure: INTRAOPERATIVE TRANSESOPHAGEAL ECHOCARDIOGRAM;  Surgeon: Kerin Perna, MD;  Location: [facility] OR;  Service: Open Heart Surgery;  Laterality: N/A;  . ROTATOR CUFF REPAIR Right 1996,1997,2000  . TEE WITHOUT CARDIOVERSION N/A 04/24/2014   Procedure: TRANSESOPHAGEAL ECHOCARDIOGRAM (TEE);  Surgeon: [name], MD;  Location: [facility] ENDOSCOPY;  Service: Cardiovascular;  Laterality: N/A;   Social History:  reports that he has quit smoking. His smoking use included cigarettes. He has a 25.00 pack-year smoking history. He has quit using smokeless tobacco.  His smokeless tobacco use included chew. He reports that he does not drink alcohol and does not use drugs.  Allergies  Allergen Reactions  . Glimepiride     Unusual sweating and irregular heart beat   Family History  Problem Relation Age of Onset  . Throat cancer Father   . Stroke Father   . Colon polyps Brother   . Lung cancer Brother   . Prostate cancer Neg Hx   . Bladder Cancer Neg Hx   . Kidney cancer Neg Hx    Family history: Family history reviewed and lung cancer in brother  Prior to Admission medications   Medication Sig Start Date End Date Taking? Authorizing Provider  atorvastatin (LIPITOR) 40 MG tablet TAKE 1 TABLET BY MOUTH ONCE DAILY AT 6 PM. 04/16/20  Yes Iran Ouch, MD  carvedilol (COREG) 12.5 MG tablet TAKE 1 TABLET BY MOUTH TWICE DAILY WITH A MEAL 05/14/20  Yes Iran Ouch, MD  clotrimazole-betamethasone (LOTRISONE) cream Apply 1 application topically 2 (two) times daily as  needed (skin irritation/breakouts).  12/06/19  Yes [provider]  colchicine 0.6 MG tablet Take 0.6 mg by mouth daily as needed (for gout flareup).    Yes [provider]  ELIQUIS 5 MG TABS tablet Take 1 tablet by mouth twice daily 05/02/20  Yes Dunn, Raymon Mutton, PA-C  furosemide (LASIX) 40 MG tablet Take 1 tablet by mouth once daily 02/09/20  Yes Iran Ouch, MD  gabapentin (NEURONTIN) 300 MG capsule Take 300 mg by mouth 4 (four) times daily.  01/02/14  Yes [provider]  HYDROcodone-acetaminophen (NORCO/VICODIN) 5-325 MG tablet Take 1 tablet by mouth every 6 (six) hours as needed. 06/29/20  Yes [provider]  meloxicam (MOBIC) 15 MG tablet Take 15 mg by mouth daily. 06/17/20  Yes [provider]  nitroGLYCERIN (NITROSTAT) 0.4 MG SL tablet Place 1 tablet (0.4 mg total) under the tongue every 5 (five)  minutes as needed for chest pain. 08/31/17  Yes Dunn, Ryan M, PA-C  SSD 1 % cream Apply topically in the morning and at bedtime. 06/17/20  Yes [provider]  valsartan (DIOVAN) 40 MG tablet Take 1 tablet by mouth once daily 04/02/20  Yes Iran Ouch, MD   Physical Exam: Vitals:   07/01/20 1626 07/01/20 1627 07/01/20 1921  BP: 112/62  (!) 115/104  Pulse: 91  87  Resp: 20  19  Temp: 97.8 F (36.6 C)    TempSrc: Oral    SpO2: 98%  99%  Weight:  113.4 kg   Height:  5\' 9"  (1.753 m)    Constitutional: appears , NAD, calm, comfortable Eyes: PERRL, lids and conjunctivae normal ENMT: Mucous membranes are dry. Posterior pharynx clear of any exudate or lesions. Age-appropriate dentition. Hearing appropriate Neck: normal, supple, no masses, no thyromegaly Respiratory: clear to auscultation bilaterally, no wheezing, no crackles. Normal respiratory effort. No accessory muscle use.  Cardiovascular: Regular rate and rhythm, no murmurs / rubs / gallops. 2+ bilateral lower extremity pitting edema. 2+ pedal pulses. No carotid bruits.  Abdomen:  obese, no tenderness, no masses palpated, no hepatosplenomegaly. Bowel sounds positive.  Musculoskeletal: no clubbing / cyanosis. No joint deformity upper and lower extremities. Good ROM, no contractures, no atrophy. Normal muscle tone.  Skin: left foot lateral/fifth metatarsal abscess with visual underlying black necrotic tissue with warmth, edema and erythema to inferior to patella. Left lower thoracic wound, approximately 7 to 8 cm, with white eschar and red base. Neurologic: CN 2-12 grossly intact. Sensation intact. Strength 5/5 in all 4.  Psychiatric: Normal judgment and insight. Alert and oriented x 3. Normal mood.   EKG: No EKG ordered in the ED, and not indicated  Left foot x-ray on Admission: Personally reviewed and I agree with radiologist reading as below.  DG Foot Complete Left  Result Date: 07/01/2020 CLINICAL DATA:  Wound at LEFT fifth metatarsal EXAM: LEFT FOOT - COMPLETE 3+ VIEW COMPARISON:  12/03/2011 FINDINGS: Foci of soft tissue gas lateral to the distal LEFT fifth metatarsal. Diffuse soft tissue swelling LEFT foot. Osseous demineralization. Joint spaces preserved. No acute fracture, dislocation, or bone destruction. Degenerative changes at first TMT joint. Bulky Achilles insertion calcaneal spur. IMPRESSION: No acute osseous abnormalities. Soft tissue swelling LEFT foot with foci of gas lateral to the distal fifth metatarsal, question site of ulcer and or infection by gas-forming organism. Findings called to Dr. Cyril Loosen on 07/01/2020 at 1739 hrs. Electronically Signed   By: Ulyses Southward M.D.   On: 07/01/2020 17:40   Labs on Admission: I have personally reviewed following labs  CBC: Recent Labs  Lab 06/25/20 1545 07/01/20 1640  WBC 9.1 13.3*  NEUTROABS 5.9 10.5*  HGB 14.2 12.7*  HCT 43.4 39.2  MCV 90.4 89.9  PLT 244 215   Basic Metabolic Panel: Recent Labs  Lab 06/25/20 1545 07/01/20 1640  NA 136 133*  K 5.0 3.8  CL 99 96*  CO2 30 24  GLUCOSE 103* 169*  BUN 36*  33*  CREATININE 1.79* 1.41*  CALCIUM 8.9 8.8*   GFR: Estimated Creatinine Clearance: 54.5 mL/min (A) (by C-G formula based on SCr of 1.41 mg/dL (H)). Liver Function Tests: Recent Labs  Lab 06/25/20 1545 07/01/20 1640  AST 20 36  ALT 24 32  ALKPHOS 71 86  BILITOT 0.7 1.1  PROT 7.0 6.9  ALBUMIN 3.7 3.3*   CBG: Recent Labs  Lab 07/01/20 1639  GLUCAP 151*   Urine  analysis:    Component Value Date/Time   COLORURINE STRAW (A) 07/01/2020 1920   APPEARANCEUR CLEAR (A) 07/01/2020 1920   APPEARANCEUR Clear 04/21/2018 1417   LABSPEC 1.006 07/01/2020 1920   PHURINE 5.0 07/01/2020 1920   GLUCOSEU NEGATIVE 07/01/2020 1920   HGBUR NEGATIVE 07/01/2020 1920   BILIRUBINUR NEGATIVE 07/01/2020 1920   BILIRUBINUR Negative 04/21/2018 1417   KETONESUR NEGATIVE 07/01/2020 1920   PROTEINUR NEGATIVE 07/01/2020 1920   UROBILINOGEN 0.2 06/02/2014 1443   NITRITE NEGATIVE 07/01/2020 1920   LEUKOCYTESUR NEGATIVE 07/01/2020 1920   Tamani Durney N Tierria Watson D.O. [facility]  If 12AM-7AM, please contact overnight-coverage provider If 7AM-7PM, please contact day coverage provider [email or web address]  07/01/2020, 8:33 PM

## 2020-07-01 NOTE — ED Provider Notes (Signed)
[facility] Emergency Department Provider Note   ____________________________________________   First MD Initiated Contact with Patient 07/01/20 1808     (approximate)  I have reviewed the triage vital signs and the nursing notes.   HISTORY  Chief Complaint Wound Infection    HPI [name] [name] is a 79 y.o. male with a stated past medical history of type 2 diabetes, gout, GERD, chronic back pain, and hypertension who presents for a left foot lesion concerning for an infection.  Patient states that he has had an exacerbation of his chronic back pain over the last week.  Over this time patient does not note any trauma to this left foot but started noticing some worsening pain, redness, and ulceration over the lateral aspect of the distal left foot.  Patient now describes spreading redness up his leg with associated 8/10 aching pain that also radiates up his leg and is worse with palpation or putting any weight on this foot.  Patient states that he does have neuropathy at baseline but can feel the pain in his foot.  Patient was sent from [facility] clinic with concerns for possible osteomyelitis         Past Medical History:  Diagnosis Date  . AAA (abdominal aortic aneurysm) (HCC)   . ACE inhibitor associated hyperkalemia 09/20/2015  . Arthritis   . CAD (coronary artery disease)    a. 03/2014 NSTEMI--> 05/2014 CABG x 5 (LIMA->D1, VG->LAD, VG->OM1, VG->OM2, VG->RPDA);  c. 04/2015 Cath: LM nl, LAD 100ost, 40d, RI 80, LCX 80p/m, OM1 70, OM2 80, RCA 20p, 79m, 90d, VG->dLAD min irregs, VG->OM1 100, VG->OM2 100, VG->RPDA nl, LIMA->D2 nl.  . Chronic systolic CHF (congestive heart failure) (HCC)    a. EF 25-30% by 2D ECHO 04/20/14. Severely dec global hypokinesis. mildly elevated RVSP;  b. 12/2014 Echo: EF 40-45%, mod dil LA, mildly dil RV/RA;  c. 04/2015 Echo: EF 30-35%, sev antsept HK, Gr1 DD, mildly dil RA/LA;  d. 07/2015 Echo: EF 35-40%, ant/antsept HK, Gr1 DD, mildly  red RV fxn.  . Diabetes mellitus without complication (HCC)   . Epigastric hernia   . Frequent PVCs    a. 24 hr Holter 06/2015: >28K PVCs accounting for 25% of all beats, rare PAC-->mexilitene started;  b. 07/2015 Holter: 1700 PVC's/24 hrs.  [name] GERD (gastroesophageal reflux disease)   . Gout   . Hyperlipidemia   . Hypertension   . Ischemic cardiomyopathy    a. EF 25-30% 03/2014;  b. EF 40-45% 12/2014.  . Mitral regurgitation    a. 03/2014 TEE: mild to mod MR.  . Nephrolithiasis   . Neuromuscular disorder (HCC)    DIABETIC NEUROPATHY  . Obesity   . Umbilical hernia     Patient Active Problem List   Diagnosis Date Noted  . Cellulitis and abscess of toe of left foot 07/01/2020  . Staghorn calculus 07/01/2020  . A-fib (HCC) 01/01/2020  . Arthritis 05/13/2016  . Kidney stones 05/13/2016  . Renal cyst 05/13/2016  . Thoracic spine fracture (HCC) 05/13/2016  . Ventral hernia 05/13/2016  . Type 2 diabetes mellitus with diabetic polyneuropathy, without long-term current use of insulin (HCC) 03/03/2016  . Vertigo 03/03/2016  . Low back pain 03/03/2016  . Problems with swallowing and mastication   . Gastritis   . Other specified diseases of esophagus   . Morbid obesity with BMI of 40.0-44.9, adult (HCC) 11/27/2015  . ACE inhibitor associated hyperkalemia 09/20/2015  . Weakness of hand -bilateral 09/20/2015  .  Cardiomyopathy, ischemic 06/13/2015  . Coronary artery disease involving native coronary artery with other forms of angina pectoris   . AAA (abdominal aortic aneurysm) (HCC) 04/19/2015  . Right foot ulcer (HCC) 01/16/2015  . 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 CAD (coronary artery disease)   . Chronic systolic CHF (congestive heart failure) (HCC)   . Obesity   . Gout   . Hyperlipidemia   . Hypertension   . Diastasis, muscle 01/12/2014  . Umbilical hernia 01/12/2014  . Epigastric hernia 01/12/2014  . Morbid obesity (HCC) 01/12/2014    Past Surgical History:  Procedure Laterality  Date  . CARDIAC CATHETERIZATION     [facility]  . CARDIAC CATHETERIZATION N/A 05/23/2015   Procedure: Left Heart Cath and Coronary Angiography;  Surgeon: Iran Ouch, MD;  Location: [facility] INVASIVE CV LAB;  Service: Cardiovascular;  Laterality: N/A;  . CARDIOVERSION N/A 01/30/2020   Procedure: CARDIOVERSION;  Surgeon: Iran Ouch, MD;  Location: [facility] ORS;  Service: Cardiovascular;  Laterality: N/A;  . CARPAL TUNNEL RELEASE Right   . CORONARY ARTERY BYPASS GRAFT N/A 06/06/2014   Procedure: CORONARY ARTERY BYPASS GRAFTING (CABG) x 5 USING LEFT AND RIGHT SAPHENOUS LEG VEIN HARVESTED ENDOSCOPICALLY;  Surgeon: Kerin Perna, MD;  Location: [facility] OR;  Service: Open Heart Surgery;  Laterality: N/A;  . ESOPHAGOGASTRODUODENOSCOPY (EGD) WITH PROPOFOL N/A 02/11/2016   Procedure: ESOPHAGOGASTRODUODENOSCOPY (EGD) WITH PROPOFOL with dialation;  Surgeon: [name], MD;  Location: [facility];  Service: Endoscopy;  Laterality: N/A;  diabetic, oral meds  . INTRAOPERATIVE TRANSESOPHAGEAL ECHOCARDIOGRAM N/A 06/06/2014   Procedure: INTRAOPERATIVE TRANSESOPHAGEAL ECHOCARDIOGRAM;  Surgeon: Kerin Perna, MD;  Location: [facility] OR;  Service: Open Heart Surgery;  Laterality: N/A;  . ROTATOR CUFF REPAIR Right 1996,1997,2000  . TEE WITHOUT CARDIOVERSION N/A 04/24/2014   Procedure: TRANSESOPHAGEAL ECHOCARDIOGRAM (TEE);  Surgeon: [name], MD;  Location: [facility] ENDOSCOPY;  Service: Cardiovascular;  Laterality: N/A;    Prior to Admission medications   Medication Sig Start Date End Date Taking? Authorizing Provider  atorvastatin (LIPITOR) 40 MG tablet TAKE 1 TABLET BY MOUTH ONCE DAILY AT 6 PM. 04/16/20  Yes Iran Ouch, MD  carvedilol (COREG) 12.5 MG tablet TAKE 1 TABLET BY MOUTH TWICE DAILY WITH A MEAL 05/14/20  Yes Iran Ouch, MD  clotrimazole-betamethasone (LOTRISONE) cream Apply 1 application topically 2 (two) times daily as needed (skin irritation/breakouts).  12/06/19  Yes [provider]    colchicine 0.6 MG tablet Take 0.6 mg by mouth daily as needed (for gout flareup).    Yes [provider]  ELIQUIS 5 MG TABS tablet Take 1 tablet by mouth twice daily 05/02/20  Yes Dunn, Raymon Mutton, PA-C  furosemide (LASIX) 40 MG tablet Take 1 tablet by mouth once daily 02/09/20  Yes Iran Ouch, MD  gabapentin (NEURONTIN) 300 MG capsule Take 300 mg by mouth 4 (four) times daily.  01/02/14  Yes [provider]  HYDROcodone-acetaminophen (NORCO/VICODIN) 5-325 MG tablet Take 1 tablet by mouth every 6 (six) hours as needed. 06/29/20  Yes [provider]  meloxicam (MOBIC) 15 MG tablet Take 15 mg by mouth daily. 06/17/20  Yes [provider]  nitroGLYCERIN (NITROSTAT) 0.4 MG SL tablet Place 1 tablet (0.4 mg total) under the tongue every 5 (five) minutes as needed for chest pain. 08/31/17  Yes Dunn, Ryan M, PA-C  SSD 1 % cream Apply topically in the morning and at bedtime. 06/17/20  Yes [provider]  valsartan (DIOVAN) 40 MG tablet Take  1 tablet by mouth once daily 04/02/20  Yes Iran Ouch, MD    Allergies Glimepiride  Family History  Problem Relation Age of Onset  . Throat cancer Father   . Stroke Father   . Colon polyps Brother   . Lung cancer Brother   . Prostate cancer Neg Hx   . Bladder Cancer Neg Hx   . Kidney cancer Neg Hx     Social History Social History   Tobacco Use  . Smoking status: Former Smoker    Packs/day: 1.00    Years: 25.00    Pack years: 25.00    Types: Cigarettes  . Smokeless tobacco: Former Neurosurgeon    Types: Chew  . Tobacco comment: quit early 1990's  Substance Use Topics  . Alcohol use: No  . Drug use: No    Review of Systems Constitutional: No fever/chills Eyes: No visual changes. ENT: No sore throat. Cardiovascular: Denies chest pain. Respiratory: Denies shortness of breath. Gastrointestinal: No abdominal pain.  No nausea, no vomiting.  No diarrhea. Genitourinary: Negative for  dysuria. Musculoskeletal: Positive for acute arthralgia in the left foot Skin: Negative for rash. Neurological: Negative for headaches, weakness/numbness/paresthesias in any extremity Psychiatric: Negative for suicidal ideation/homicidal ideation   ____________________________________________   PHYSICAL EXAM:  VITAL SIGNS: ED Triage Vitals  Enc Vitals Group     BP 07/01/20 1626 112/62     Pulse Rate 07/01/20 1626 91     Resp 07/01/20 1626 20     Temp 07/01/20 1626 97.8 F (36.6 C)     Temp Source 07/01/20 1626 Oral     SpO2 07/01/20 1626 98 %     Weight 07/01/20 1627 250 lb (113.4 kg)     Height 07/01/20 1627 5\' 9"  (1.753 m)     Head Circumference --      Peak Flow --      Pain Score 07/01/20 1626 0     Pain Loc --      Pain Edu? --      Excl. in GC? --    Constitutional: Alert and oriented. Well appearing and in no acute distress. Eyes: Conjunctivae are normal. PERRL. Head: Atraumatic. Nose: No congestion/rhinnorhea. Mouth/Throat: Mucous membranes are moist. Neck: No stridor Cardiovascular: Grossly normal heart sounds.  Good peripheral circulation. Respiratory: Normal respiratory effort.  No retractions. Gastrointestinal: Soft and nontender. No distention. Musculoskeletal: No obvious deformities Neurologic:  Normal speech and language. No gross focal neurologic deficits are appreciated. Skin:  Skin is warm and dry.  3 cm diameter area of ulceration with purulent drainage that can be expressed from the superior portion of this wound as well as surrounding erythema and edema that is spreading up to the mid calf on the left side Psychiatric: Mood and affect are normal. Speech and behavior are normal.  ____________________________________________   LABS (all labs ordered are listed, but only abnormal results are displayed)  Labs Reviewed  COMPREHENSIVE METABOLIC PANEL - Abnormal; Notable for the following components:      Result Value   Sodium 133 (*)    Chloride 96  (*)    Glucose, Bld 169 (*)    BUN 33 (*)    Creatinine, Ser 1.41 (*)    Calcium 8.8 (*)    Albumin 3.3 (*)    GFR, Estimated 51 (*)    All other components within normal limits  CBC WITH DIFFERENTIAL/PLATELET - Abnormal; Notable for the following components:   WBC 13.3 (*)    Hemoglobin 12.7 (*)  Neutro Abs 10.5 (*)    All other components within normal limits  URINALYSIS, COMPLETE (UACMP) WITH MICROSCOPIC - Abnormal; Notable for the following components:   Color, Urine STRAW (*)    APPearance CLEAR (*)    All other components within normal limits  SEDIMENTATION RATE - Abnormal; Notable for the following components:   Sed Rate 46 (*)    All other components within normal limits  CBG MONITORING, ED - Abnormal; Notable for the following components:   Glucose-Capillary 151 (*)    All other components within normal limits  RESPIRATORY PANEL BY RT PCR (FLU A&B, COVID)  AEROBIC CULTURE (SUPERFICIAL SPECIMEN)  CULTURE, BLOOD (ROUTINE X 2)  CULTURE, BLOOD (ROUTINE X 2)  MRSA PCR SCREENING  C DIFFICILE QUICK SCREEN W PCR REFLEX  LACTIC ACID, PLASMA  LACTIC ACID, PLASMA  C-REACTIVE PROTEIN  COMPREHENSIVE METABOLIC PANEL  CBC  HEMOGLOBIN A1C    RADIOLOGY  ED MD interpretation: Three-view x-ray of the left foot shows soft tissue swelling and a foci of gas lateral to the distal fifth digit  Official radiology report(s): DG Foot Complete Left  Result Date: 07/01/2020 CLINICAL DATA:  Wound at LEFT fifth metatarsal EXAM: LEFT FOOT - COMPLETE 3+ VIEW COMPARISON:  12/03/2011 FINDINGS: Foci of soft tissue gas lateral to the distal LEFT fifth metatarsal. Diffuse soft tissue swelling LEFT foot. Osseous demineralization. Joint spaces preserved. No acute fracture, dislocation, or bone destruction. Degenerative changes at first TMT joint. Bulky Achilles insertion calcaneal spur. IMPRESSION: No acute osseous abnormalities. Soft tissue swelling LEFT foot with foci of gas lateral to the distal  fifth metatarsal, question site of ulcer and or infection by gas-forming organism. Findings called to Dr. Cyril Loosen on 07/01/2020 at 1739 hrs. Electronically Signed   By: Ulyses Southward M.D.   On: 07/01/2020 17:40    ____________________________________________   PROCEDURES  Procedure(s) performed (including Critical Care):  .1-3 Lead EKG Interpretation Performed by: Merwyn Katos, MD Authorized by: Merwyn Katos, MD     Interpretation: normal     ECG rate:  87   ECG rate assessment: normal     Rhythm: sinus rhythm     Ectopy: none     Conduction: normal       ____________________________________________   INITIAL IMPRESSION / ASSESSMENT AND PLAN / ED COURSE  As part of my medical decision making, I reviewed the following data within the electronic MEDICAL RECORD NUMBER Nursing notes reviewed and incorporated, Labs reviewed, Old chart reviewed, Radiograph reviewed and Notes from prior ED visits reviewed and incorporated      Presentation most consistent with simple cellulitis. Given History, Exam, and Workup I have low suspicion for Necrotizing Fasciitis, Abscess, Osteomyelitis, DVT or other emergent problem as a cause for this presentation.  Given patient's leukocytosis, and inability to perform his ADLs at home to keep this wound clean, patient will require admission to the internal medicine service for further evaluation and management     ____________________________________________   FINAL CLINICAL IMPRESSION(S) / ED DIAGNOSES  Final diagnoses:  Wound infection     ED Discharge Orders    None       Note:  This document was prepared using Dragon voice recognition software and may include unintentional dictation errors.   Merwyn Katos, MD 07/01/20 [phone]

## 2020-07-01 NOTE — ED Notes (Signed)
EDP @ bedside 

## 2020-07-01 NOTE — Progress Notes (Signed)
Pharmacy Antibiotic Note  [name] [name] is a 79 y.o. male admitted on 07/01/2020 with cellulitis and abscess of foot.  Pharmacy has been consulted for Vancomcyin dosing.  Plan: Patient received Vancomycin 1g IV times 1 in the ED, will order Vancomycin 1250mg  IV q24h to begin at 22:00.   Height: 5\' 9"  (175.3 cm) Weight: 113.4 kg (250 lb) IBW/kg (Calculated) : 70.7  Temp (24hrs), Avg:97.8 F (36.6 C), Min:97.8 F (36.6 C), Max:97.8 F (36.6 C)  Recent Labs  Lab 06/25/20 1545 07/01/20 1640 07/01/20 1920  WBC 9.1 13.3*  --   CREATININE 1.79* 1.41*  --   LATICACIDVEN  --  1.3 0.8    Estimated Creatinine Clearance: 54.5 mL/min (A) (by C-G formula based on SCr of 1.41 mg/dL (H)).    Allergies  Allergen Reactions  . Glimepiride     Unusual sweating and irregular heart beat    Antimicrobials this admission: Vancomycin 11/7 >>      Dose adjustments this admission:  Microbiology results:   Thank you for allowing pharmacy to be a part of this patient's care.  13/07/21, PharmD, BCPS 07/01/2020 8:39 PM

## 2020-07-01 NOTE — Progress Notes (Signed)
CODE SEPSIS - PHARMACY COMMUNICATION  **Broad Spectrum Antibiotics should be administered within 1 hour of Sepsis diagnosis**  Time Code Sepsis Called/Page Received: 18:11  Antibiotics Ordered: Ceftriaxone and Vancomycin  Time of 1st antibiotic administration: Ceftriaxone given at 19:09  Additional action taken by pharmacy: n/a  If necessary, Name of Provider/Nurse Contacted: n/a    Foye Deer ,PharmD Clinical Pharmacist  07/01/2020  6:37 PM

## 2020-07-01 NOTE — ED Notes (Signed)
Blood cultures and labs obtained by Chrissy, RN. Prior to start if abx.

## 2020-07-01 NOTE — Progress Notes (Signed)
PHARMACY -  BRIEF ANTIBIOTIC NOTE   Pharmacy has received consult(s) for Vancomcyin from an ED provider.  The patient's profile has been reviewed for ht/wt/allergies/indication/available labs.    One time order(s) placed for Vancomycin 1g by ED provider.  Further antibiotics/pharmacy consults should be ordered by admitting physician if indicated.                       Thank you, Foye Deer 07/01/2020  6:37 PM

## 2020-07-02 ENCOUNTER — Observation Stay: Payer: PPO

## 2020-07-02 DIAGNOSIS — T148XXA Other injury of unspecified body region, initial encounter: Secondary | ICD-10-CM

## 2020-07-02 DIAGNOSIS — L97522 Non-pressure chronic ulcer of other part of left foot with fat layer exposed: Secondary | ICD-10-CM | POA: Diagnosis not present

## 2020-07-02 DIAGNOSIS — L089 Local infection of the skin and subcutaneous tissue, unspecified: Secondary | ICD-10-CM

## 2020-07-02 DIAGNOSIS — I4891 Unspecified atrial fibrillation: Secondary | ICD-10-CM | POA: Diagnosis not present

## 2020-07-02 DIAGNOSIS — M19072 Primary osteoarthritis, left ankle and foot: Secondary | ICD-10-CM | POA: Diagnosis not present

## 2020-07-02 DIAGNOSIS — L03116 Cellulitis of left lower limb: Secondary | ICD-10-CM | POA: Diagnosis not present

## 2020-07-02 DIAGNOSIS — L03032 Cellulitis of left toe: Secondary | ICD-10-CM | POA: Diagnosis not present

## 2020-07-02 DIAGNOSIS — I10 Essential (primary) hypertension: Secondary | ICD-10-CM | POA: Diagnosis not present

## 2020-07-02 DIAGNOSIS — E1149 Type 2 diabetes mellitus with other diabetic neurological complication: Secondary | ICD-10-CM | POA: Diagnosis not present

## 2020-07-02 DIAGNOSIS — L97529 Non-pressure chronic ulcer of other part of left foot with unspecified severity: Secondary | ICD-10-CM | POA: Diagnosis not present

## 2020-07-02 DIAGNOSIS — L02612 Cutaneous abscess of left foot: Secondary | ICD-10-CM | POA: Diagnosis not present

## 2020-07-02 DIAGNOSIS — M25475 Effusion, left foot: Secondary | ICD-10-CM | POA: Diagnosis not present

## 2020-07-02 DIAGNOSIS — L97521 Non-pressure chronic ulcer of other part of left foot limited to breakdown of skin: Secondary | ICD-10-CM | POA: Diagnosis not present

## 2020-07-02 DIAGNOSIS — R600 Localized edema: Secondary | ICD-10-CM | POA: Diagnosis not present

## 2020-07-02 LAB — COMPREHENSIVE METABOLIC PANEL
ALT: 41 U/L (ref 0–44)
AST: 42 U/L — ABNORMAL HIGH (ref 15–41)
Albumin: 3.1 g/dL — ABNORMAL LOW (ref 3.5–5.0)
Alkaline Phosphatase: 85 U/L (ref 38–126)
Anion gap: 8 (ref 5–15)
BUN: 26 mg/dL — ABNORMAL HIGH (ref 8–23)
CO2: 26 mmol/L (ref 22–32)
Calcium: 8.6 mg/dL — ABNORMAL LOW (ref 8.9–10.3)
Chloride: 99 mmol/L (ref 98–111)
Creatinine, Ser: 1.29 mg/dL — ABNORMAL HIGH (ref 0.61–1.24)
GFR, Estimated: 57 mL/min — ABNORMAL LOW (ref >60)
Glucose, Bld: 123 mg/dL — ABNORMAL HIGH (ref 70–99)
Potassium: 4.1 mmol/L (ref 3.5–5.1)
Sodium: 133 mmol/L — ABNORMAL LOW (ref 135–145)
Total Bilirubin: 0.9 mg/dL (ref 0.3–1.2)
Total Protein: 6.4 g/dL — ABNORMAL LOW (ref 6.5–8.1)

## 2020-07-02 LAB — C-REACTIVE PROTEIN: CRP: 20.2 mg/dL — ABNORMAL HIGH (ref <1.0)

## 2020-07-02 LAB — CBC
HCT: 35.5 % — ABNORMAL LOW (ref 39.0–52.0)
Hemoglobin: 11.5 g/dL — ABNORMAL LOW (ref 13.0–17.0)
MCH: 29.6 pg (ref 26.0–34.0)
MCHC: 32.4 g/dL (ref 30.0–36.0)
MCV: 91.3 fL (ref 80.0–100.0)
Platelets: 217 10*3/uL (ref 150–400)
RBC: 3.89 MIL/uL — ABNORMAL LOW (ref 4.22–5.81)
RDW: 12.6 % (ref 11.5–15.5)
WBC: 9.8 10*3/uL (ref 4.0–10.5)
nRBC: 0 % (ref 0.0–0.2)

## 2020-07-02 LAB — HEMOGLOBIN A1C
Hgb A1c MFr Bld: 6.3 % — ABNORMAL HIGH (ref 4.8–5.6)
Mean Plasma Glucose: 134.11 mg/dL

## 2020-07-02 LAB — MRSA PCR SCREENING: MRSA by PCR: NEGATIVE

## 2020-07-02 LAB — C DIFFICILE QUICK SCREEN W PCR REFLEX
C Diff antigen: NEGATIVE
C Diff interpretation: NOT DETECTED
C Diff toxin: NEGATIVE

## 2020-07-02 IMAGING — MR MR FOOT*L* W/O CM
6 series · 40 of 40 positions shown · non-contrast
Comparison: X-ray [DATE]

CLINICAL DATA: Ulceration at the lateral aspect of the forefoot in
the region of the fifth MTP joint

EXAM:
MRI OF THE LEFT FOOT WITHOUT CONTRAST
TECHNIQUE: Multiplanar, multisequence MR imaging of the left forefoot was
performed. No intravenous contrast was administered.

[Series 4: T1 · coronal · left · 3.0mm · 0.47mm/px · 8 of 50 slices shown (1 of 3)]
[im 1/50]
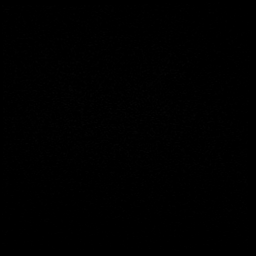
[im 8/50]
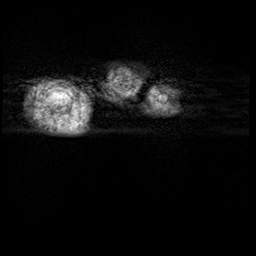
[im 15/50]
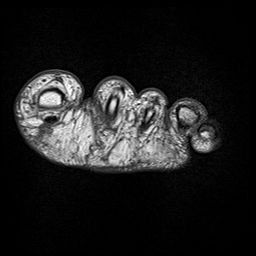
[im 22/50]
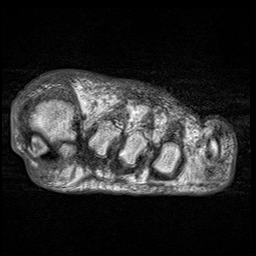
[im 29/50]
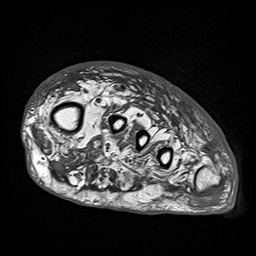
[im 36/50]
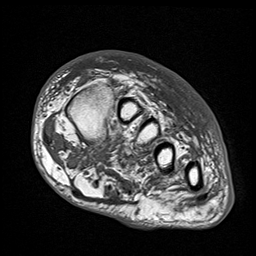
[im 43/50]
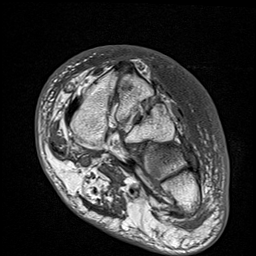
[im 50/50]
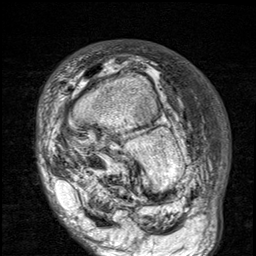

[Series 5: T2 · coronal · left · 3.0mm · 0.47mm/px · 9 of 50 slices shown (1 of 2)]
[im 1/50]
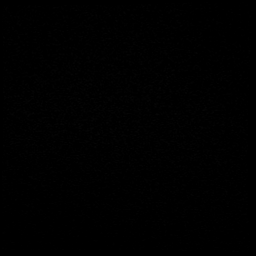
[im 7/50]
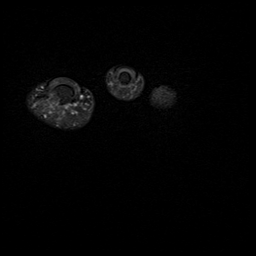
[im 13/50]
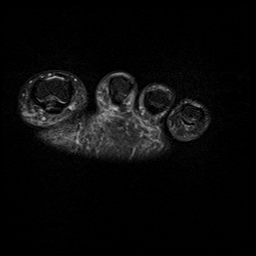
[im 19/50]
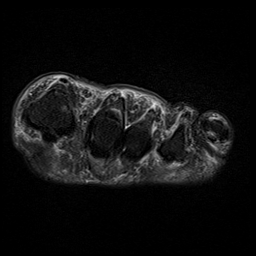
[im 25/50]
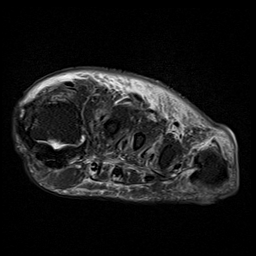
[im 31/50]
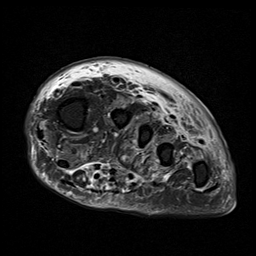
[im 37/50]
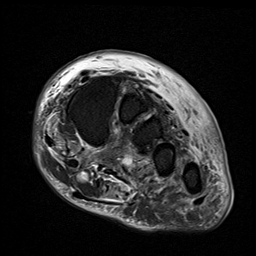
[im 43/50]
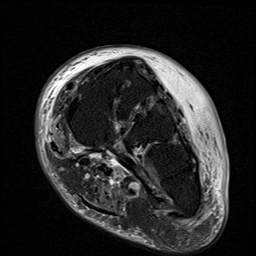
[im 50/50]
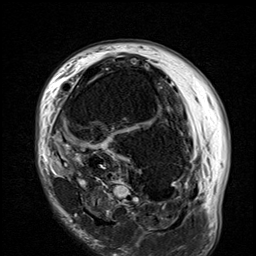

[Series 7: T1 · oblique · left · 3.0mm · 0.78mm/px · 4 of 25 slices shown (2 of 3)]
[im 1/25]
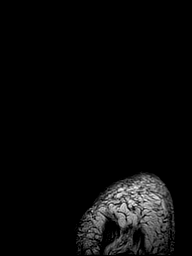
[im 9/25]
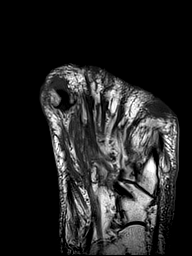
[im 17/25]
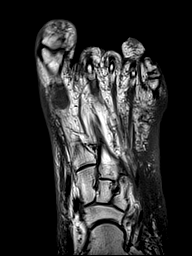
[im 25/25]
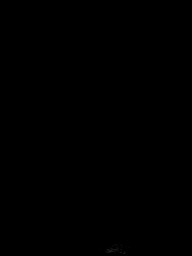

[Series 9: T2 · oblique · left · 3.0mm · 0.78mm/px · 4 of 25 slices shown (2 of 2)]
[im 1/25]
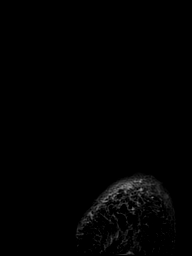
[im 9/25]
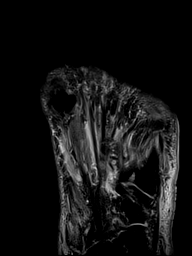
[im 17/25]
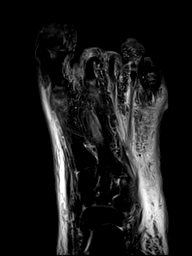
[im 25/25]
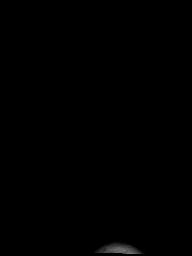

[Series 10: STIR · sagittal · left · 3.0mm · 0.62mm/px · 6 of 35 slices shown]
[im 1/35]
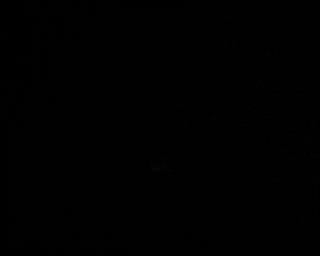
[im 7/35]
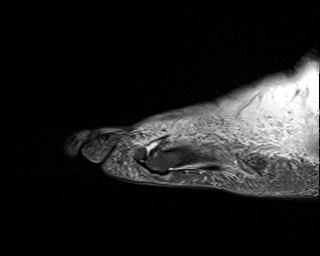
[im 14/35]
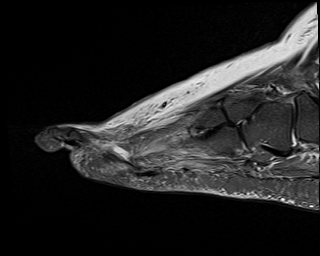
[im 21/35]
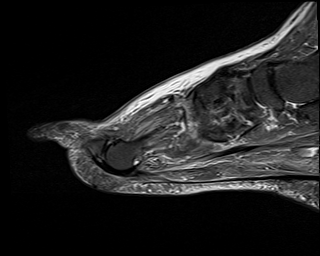
[im 28/35]
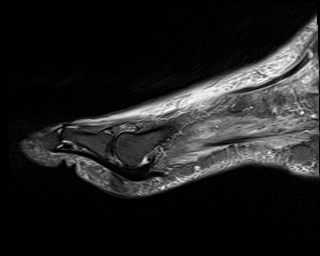
[im 35/35]
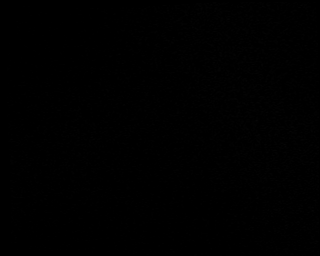

[Series 11: T1 · coronal · left · 3.0mm · 0.47mm/px · 9 of 50 slices shown (3 of 3)]
[im 1/50]
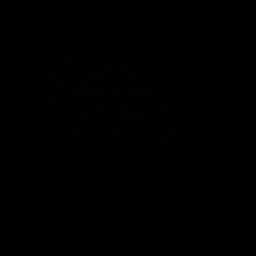
[im 7/50]
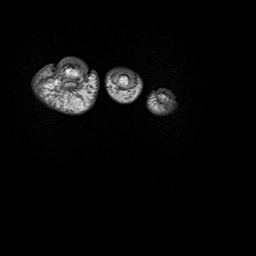
[im 13/50]
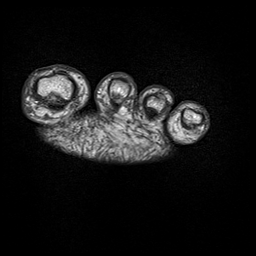
[im 19/50]
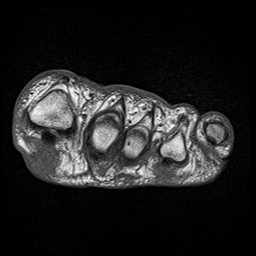
[im 25/50]
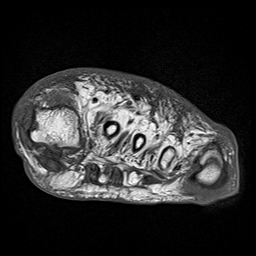
[im 31/50]
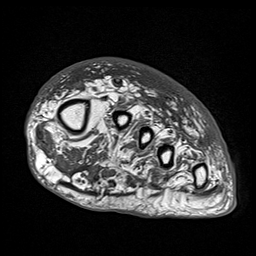
[im 37/50]
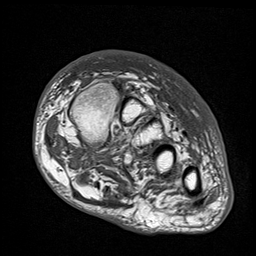
[im 43/50]
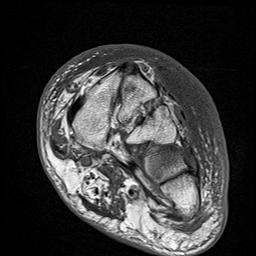
[im 50/50]
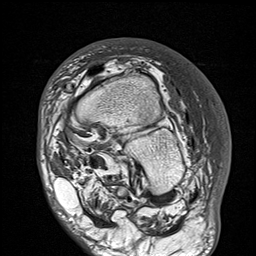

[40 of 40 positions shown; findings below may reference images not displayed]

FINDINGS: Bones/Joint/Cartilage

No acute fracture. No dislocation. No bony erosion. No bone marrow
edema. No marrow replacing process. Mild degenerative changes at the
first MTP joint. There are small nonspecific joint effusions at the
second through fifth MTP joints.

Ligaments

Intact Lisfranc ligament. Collateral ligaments of the forefoot
intact.

Muscles and Tendons

Atrophy and fatty infiltration of the intrinsic foot musculature
suggesting chronic denervation changes. Subtle diffuse edema-like
intramuscular signal throughout the forefoot which could represent
denervation versus myositis. Grossly intact flexor and extensor
tendons. No tenosynovitis.

Soft tissues

Skin and soft tissue thickening at the lateral aspect of the
forefoot along the plantar and lateral margins at the level of the
fifth MTP joint with surrounding soft tissue edema. No deep soft
tissue ulceration. No underlying fluid collection. There is also
some soft tissue thickening at the plantar aspect of the first MTP
joint. Prominent dorsal subcutaneous edema throughout the forefoot
and visualized midfoot.
IMPRESSION: 1. No MRI evidence of acute osteomyelitis of the left forefoot.
2. Suspect shallow superficial ulceration at the level of the fifth
MTP joint with adjacent cellulitis. No underlying fluid collection
to suggest abscess.
3. Small nonspecific joint effusions involving the second through
fifth MTP joints, which are favored reactive. Septic arthritis would
be difficult to exclude, although is felt to be less likely given
the small size and similar appearance across multiple joints.
4. Prominent dorsal subcutaneous edema throughout the forefoot and
visualized midfoot.
5. Subtle diffuse edema-like intramuscular signal throughout the
forefoot which could represent denervation changes and/or myositis.

## 2020-07-02 MED ORDER — CLINDAMYCIN HCL 300 MG PO CAPS: 300.0000 mg | ORAL_CAPSULE | Freq: Three times a day (TID) | ORAL | 0 refills | Status: AC

## 2020-07-02 MED ORDER — "BANDAGE ROLL 4.5""X4YD MISC": 1.0000 [IU] | Freq: Every day | 0 refills | Status: DC

## 2020-07-02 MED ORDER — POVIDONE-IODINE 10 % EX SOLN: 1.0000 "application " | CUTANEOUS | 0 refills | Status: DC | PRN

## 2020-07-02 MED ORDER — COLLAGENASE 250 UNIT/GM EX OINT
TOPICAL_OINTMENT | Freq: Every day | CUTANEOUS | 0 refills | Status: DC
Start: 2020-07-02 — End: 2021-12-10

## 2020-07-02 MED ORDER — COLLAGENASE 250 UNIT/GM EX OINT
TOPICAL_OINTMENT | Freq: Every day | CUTANEOUS | Status: DC
  Filled 2020-07-02: qty 30

## 2020-07-02 MED ORDER — MEXILETINE HCL 150 MG PO CAPS
300.0000 mg | ORAL_CAPSULE | Freq: Two times a day (BID) | ORAL | Status: DC
  Administered 2020-07-02: 300 mg via ORAL
  Filled 2020-07-02 (×2): qty 2

## 2020-07-02 MED ORDER — SACUBITRIL-VALSARTAN 24-26 MG PO TABS
1.0000 | ORAL_TABLET | Freq: Two times a day (BID) | ORAL | 0 refills | Status: DC
Start: 2020-07-02 — End: 2021-12-10

## 2020-07-02 MED ORDER — VANCOMYCIN HCL 1500 MG/300ML IV SOLN
1500.0000 mg | INTRAVENOUS | Status: DC
  Filled 2020-07-02: qty 300

## 2020-07-02 NOTE — Plan of Care (Signed)

## 2020-07-02 NOTE — Progress Notes (Signed)
Pharmacy Antibiotic Note  [name] [name] is a 79 y.o. male admitted on 07/01/2020 with cellulitis and abscess of foot.    Pharmacy has been consulted for Vancomcyin dosing.  Renal function improving - will adjust dosing.  Plan: Patient received Vancomycin 1g IV times 1 in the ED, followed by Vancomycin 1250mg  IV q24h to begin 11/7 at 22:00.  Will now adjust to Vancomycin 1500mg  IV q24hr   Height: 5\' 9"  (175.3 cm) Weight: 113.4 kg (250 lb) IBW/kg (Calculated) : 70.7  Temp (24hrs), Avg:97.8 F (36.6 C), Min:97.8 F (36.6 C), Max:97.8 F (36.6 C)  Recent Labs  Lab 06/25/20 1545 07/01/20 1640 07/01/20 1920 07/02/20 0436  WBC 9.1 13.3*  --  9.8  CREATININE 1.79* 1.41*  --  1.29*  LATICACIDVEN  --  1.3 0.8  --     Estimated Creatinine Clearance: 59.6 mL/min (A) (by C-G formula based on SCr of 1.29 mg/dL (H)).    Allergies  Allergen Reactions  . Glimepiride     Unusual sweating and irregular heart beat    Antimicrobials this admission: Vancomycin 11/7 >>      Dose adjustments this admission:  Microbiology results: BCx/WCx pending  Thank you for allowing pharmacy to be a part of this patient's care.  13/07/21, PharmD, BCPS Clinical Pharmacist 07/02/2020 7:30 AM

## 2020-07-02 NOTE — ED Notes (Signed)
Pt using urinal independently in bed.

## 2020-07-02 NOTE — Discharge Summary (Signed)
Physician Discharge Summary  [name] [name]:[id number] DOB: 01-01-1941 DOA: 07/01/2020  PCP: [name], MD  Admit date: 07/01/2020 Discharge date: 07/02/2020  Admitted From: Home Disposition: Home  Recommendations for Outpatient Follow-up:  1. Follow up with PCP in 1-2 weeks 2. Follow-up with podiatry. 3. Follow-up with urology 4. Please obtain BMP/CBC in one week 5. Please follow up on the following pending results: Wound culture results.  Home Health: Yes Equipment/Devices: None Discharge Condition: Stable CODE STATUS: Full Diet recommendation: Heart Healthy / Carb Modified   Brief/Interim Summary: [name] [name] is a 79 y.o. male with medical history significant for afib on eliquis, CAD s/p CABG in 2015, A. fib status post cardioversion on 01/30/2020, hypertension, heart failure reduced ejection fraction, left renal staghorn calculi, chronic low back pain presented to the emergency department at the urging of PCP.  He states that the noticed right lower foot pain today.  He denies fever, nausea, vomiting, shortness of breath, chest pain, abdominal pain, H/A, vision changes, trouble swallowing, cough, diarrhea, dysuria, hematuria.  He states that he last saw his foot 3 days ago.  3 days ago the foot was fine.  He put on a sock and due to his back pain has been having trouble bathing therefore he has not taken off his sock in 3 days.  Right foot with a superficial blistering and abscess formation with some crepitation.  Foot x-ray with no osseous abnormalities but did show soft tissue swelling with foci of gas lateral to distal fifth metatarsal and a small ulcer at the base of fifth metatarsal along with ulceration at the lateral side of first toe.  Podiatry was consulted and they did debridement of all necrotic and devitalized tissue.  Left foot MRI was done by podiatry and it was negative for any deep pockets of infection or osteomyelitis. Patient remained afebrile,  leukocytosis resolved.  Blood cultures remain negative.  Did not meet any sepsis criteria on admission so sepsis ruled out. Patient will follow up with podiatry as an outpatient and advised to change dressing with Betadine daily.  Home health was ordered.  He received ceftriaxone and vancomycin while in the hospital and discharged on clindamycin according to advice of Dr. Alberteen Spindle his podiatrist.  Patient also found to have newer lower back wound with some eschar, stating that it was due to a burn from heating pad.  Dressing was provided and he will change dressing on a daily basis.  Patient has an history of CKD stage IIIa, mildly increased creatinine on admission which improved with some IV fluid and appears to be at his baseline.  Patient has an history of CAD s/p CABG in 2015.  No chest pain or shortness of breath.  He also has an history of HFrEF according to echo done in June 2020.  Appears euvolemic.  He will continue his home medications and follow-up with his providers as an outpatient.  Patient also has an history of A. fib s/p cardioversion.  He will continue home dose of Coreg and Eliquis and follow-up with his cardiologist.  Patient also found to have a left staghorn type renal calculus, nonobstructing.  No significant symptoms.  UA within normal limit.  He was advised to follow-up with urology as an outpatient.  Patient will continue with his home medications.  Discharge Diagnoses:  Active Problems:   Hypertension   CAD (coronary artery disease)   Arthritis   A-fib (HCC)   Cellulitis and abscess of toe of left foot  Staghorn calculus   Wound infection  Discharge Instructions  Discharge Instructions    Diet - low sodium heart healthy   Complete by: As directed    Discharge instructions   Complete by: As directed    It was pleasure taking care of you. As we discussed there is no infection down in your foot or in bone, your podiatrist wants you to change your dressing daily  with Betadine and a fresh bandage which were ordered. You can also continue using gel dressing on your back. Please follow-up with Dr. Alberteen Spindle towards the end of this week for further recommendations. You are also being given antibiotics for 10 days, please take it as directed. Continue taking rest of your home medications and follow-up with your provider.   Discharge wound care:   Complete by: As directed    Apply Santyl to lower back and then cover with foam dressing daily. Apply Betadine to your foot wound and then wrapped with a clean bandage daily.   Increase activity slowly   Complete by: As directed      Allergies as of 07/02/2020      Reactions   Glimepiride    Unusual sweating and irregular heart beat      Medication List    STOP taking these medications   valsartan 40 MG tablet Commonly known as: DIOVAN     TAKE these medications   atorvastatin 40 MG tablet Commonly known as: LIPITOR TAKE 1 TABLET BY MOUTH ONCE DAILY AT 6 PM.   Bandage Roll 4.5"x4YD Misc 1 units of lipase by Does not apply route daily.   carvedilol 12.5 MG tablet Commonly known as: COREG TAKE 1 TABLET BY MOUTH TWICE DAILY WITH A MEAL   clindamycin 300 MG capsule Commonly known as: CLEOCIN Take 1 capsule (300 mg total) by mouth 3 (three) times daily for 10 days.   clotrimazole-betamethasone cream Commonly known as: LOTRISONE Apply 1 application topically 2 (two) times daily as needed (skin irritation/breakouts).   colchicine 0.6 MG tablet Take 0.6 mg by mouth daily as needed (for gout flareup).   collagenase ointment Commonly known as: SANTYL Apply topically daily.   Eliquis 5 MG Tabs tablet Generic drug: apixaban Take 1 tablet by mouth twice daily   furosemide 40 MG tablet Commonly known as: LASIX Take 1 tablet by mouth once daily   gabapentin 300 MG capsule Commonly known as: NEURONTIN Take 300 mg by mouth 4 (four) times daily.   HYDROcodone-acetaminophen 5-325 MG  tablet Commonly known as: NORCO/VICODIN Take 1 tablet by mouth every 6 (six) hours as needed.   meloxicam 15 MG tablet Commonly known as: MOBIC Take 15 mg by mouth daily.   mexiletine 150 MG capsule Commonly known as: MEXITIL Take 300 mg by mouth 2 (two) times daily.   nitroGLYCERIN 0.4 MG SL tablet Commonly known as: NITROSTAT Place 1 tablet (0.4 mg total) under the tongue every 5 (five) minutes as needed for chest pain.   povidone-iodine 10 % external solution Commonly known as: Betadine Apply 1 application topically as needed for wound care.   sacubitril-valsartan 24-26 MG Commonly known as: ENTRESTO Take 1 tablet by mouth 2 (two) times daily.   SSD 1 % cream Generic drug: silver sulfADIAZINE Apply topically in the morning and at bedtime.            Discharge Care Instructions  (From admission, onward)         Start     Ordered   07/02/20 0000  Discharge wound care:       Comments: Apply Santyl to lower back and then cover with foam dressing daily. Apply Betadine to your foot wound and then wrapped with a clean bandage daily.   07/02/20 1252          Follow-up Information    [name], MD. Schedule an appointment as soon as possible for a visit.   Specialty: Family Medicine Contact information: [address] [facility] [place] [address] [phone]        Linus Galas, DPM. Schedule an appointment as soon as possible for a visit in 3 day(s).   Specialty: Podiatry Why: To be seen either on Thursday or Friday Contact information: [address] [facility] [address] [phone]              Allergies  Allergen Reactions  . Glimepiride     Unusual sweating and irregular heart beat    Consultations:  Podiatry  Procedures/Studies: DG Lumbar Spine 2-3 Views  Result Date: 06/25/2020 CLINICAL DATA:  Back pain for 1 week EXAM: LUMBAR SPINE - 2-3 VIEW COMPARISON:  None. FINDINGS: Frontal and lateral views of the  lumbar spine demonstrate 5 non-rib-bearing lumbar type vertebral bodies with mild right convex curvature centered at L2. Otherwise alignment is anatomic. There is severe diffuse spondylosis with disc space narrowing and osteophyte formation at all levels, greatest at the lumbosacral junction. There is diffuse facet hypertrophy, greatest at L4-5 and L5-S1. No fractures. Sacroiliac joints are normal. Extensive atherosclerosis of the abdominal aorta. IMPRESSION: 1. 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 2. No acute fracture. 3. Extensive aortic atherosclerosis. Electronically Signed   By: Sharlet Salina M.D.   On: 06/25/2020 15:07   CT ABDOMEN PELVIS W CONTRAST  Result Date: 06/25/2020 CLINICAL DATA:  Low back pain EXAM: CT ABDOMEN AND PELVIS WITH CONTRAST TECHNIQUE: Multidetector CT imaging of the abdomen and pelvis was performed using the standard protocol following bolus administration of intravenous contrast. CONTRAST:  75mL OMNIPAQUE IOHEXOL 350 MG/ML SOLN COMPARISON:  11/15/2019 FINDINGS: Lower chest: No acute abnormality. Hepatobiliary: Fatty infiltration of the liver is noted. Gallbladder demonstrates dependent density consistent with small gallstones. No wall thickening or pericholecystic fluid is noted. Pancreas: Unremarkable. No pancreatic ductal dilatation or surrounding inflammatory changes. Spleen: Normal in size without focal abnormality. Adrenals/Urinary Tract: Adrenal glands are within normal limits. Kidneys demonstrate normal enhancement pattern bilaterally. No renal calculi are noted on the right. No obstructive changes are seen. On the left is a staghorn type calculus which extends from the renal pelvis into the lower pole caliceal system and measures approximately 3.5 cm in greatest length. This is not appear to be obstructive in nature. The left ureter appears within normal limits bladder is well distended. A small  area of enhancement is noted near the insertion site of the left ureter early in the bolus. It would be difficult to exclude a focal lesion. Nonemergent urological workup is recommended. Stomach/Bowel: Stomach is decompressed. Colon shows no obstructive or inflammatory changes. The appendix is within normal limits. Proximal small bowel appears within normal limits. There are some fluid-filled more distal small bowel loops with some mild fecalization of bowel contents. This is primarily in the right lower quadrant. Possibility of partial small bowel obstruction or ileus could not be totally excluded. Vascular/Lymphatic: Atherosclerotic calcifications of the abdominal aorta are seen. Mild dilatation of the infrarenal aorta is noted to 3 cm similar to  that seen on prior ultrasound examination. No significant lymphadenopathy is noted. Reproductive: Prostate is unremarkable. Other: No abdominal wall hernia or abnormality. No abdominopelvic ascites. Musculoskeletal: Degenerative changes of lumbar spine are noted. IMPRESSION: 1. 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 No obstructive changes are seen. 2. Small area of enhancement near the insertion site of the left ureter early in the bolus. It would be difficult to exclude a focal lesion. Nonemergent urological workup is recommended. 3. Fluid-filled more distal small bowel loops with some fecalization of bowel contents. Possibility of partial small bowel obstruction or ileus could not be totally excluded. Correlate with physical exam. 4. Cholelithiasis without complicating factors. 5. Fatty liver. 6. Stable dilatation of the infrarenal aorta. Recommend follow-up ultrasound every 3 years. This recommendation follows ACR consensus guidelines: White Paper of the ACR Incidental Findings Committee II on Vascular Findings. J Am Coll Radiol 2013; 10:789-794. Aortic aneurysm NOS (ICD10-I71.9). Electronically Signed   By: Alcide Clever M.D.   On: 06/25/2020 17:03    MR FOOT LEFT WO CONTRAST  Result Date: 07/02/2020 CLINICAL DATA:  Ulceration at the lateral aspect of the forefoot in the region of the fifth MTP joint EXAM: MRI OF THE LEFT FOOT WITHOUT CONTRAST TECHNIQUE: Multiplanar, multisequence MR imaging of the left forefoot was performed. No intravenous contrast was administered. COMPARISON:  X-ray 07/01/2020 FINDINGS: Bones/Joint/Cartilage No acute fracture. No dislocation. No bony erosion. No bone marrow edema. No marrow replacing process. Mild degenerative changes at the first MTP joint. There are small nonspecific joint effusions at the second through fifth MTP joints. Ligaments Intact Lisfranc ligament. Collateral ligaments of the forefoot intact. Muscles and Tendons Atrophy and fatty infiltration of the intrinsic foot musculature suggesting chronic denervation changes. Subtle diffuse edema-like intramuscular signal throughout the forefoot which could represent denervation versus myositis. Grossly intact flexor and extensor tendons. No tenosynovitis. Soft tissues Skin and soft tissue thickening at the lateral aspect of the forefoot along the plantar and lateral margins at the level of the fifth MTP joint with surrounding soft tissue edema. No deep soft tissue ulceration. No underlying fluid collection. There is also some soft tissue thickening at the plantar aspect of the first MTP joint. Prominent dorsal subcutaneous edema throughout the forefoot and visualized midfoot. IMPRESSION: 1. No MRI evidence of acute osteomyelitis of the left forefoot. 2. Suspect shallow superficial ulceration at the level of the fifth MTP joint with adjacent cellulitis. No underlying fluid collection to suggest abscess. 3. Small nonspecific joint effusions involving the second through fifth MTP joints, which are favored reactive. Septic arthritis would be difficult to exclude, although is felt to be less likely given the small size and similar appearance across multiple joints. 4.  Prominent dorsal subcutaneous edema throughout the forefoot and visualized midfoot. 5. Subtle diffuse edema-like intramuscular signal throughout the forefoot which could represent denervation changes and/or myositis. Electronically Signed   By: Duanne Guess D.O.   On: 07/02/2020 10:59   DG Foot Complete Left  Result Date: 07/01/2020 CLINICAL DATA:  Wound at LEFT fifth metatarsal EXAM: LEFT FOOT - COMPLETE 3+ VIEW COMPARISON:  12/03/2011 FINDINGS: Foci of soft tissue gas lateral to the distal LEFT fifth metatarsal. Diffuse soft tissue swelling LEFT foot. Osseous demineralization. Joint spaces preserved. No acute fracture, dislocation, or bone destruction. Degenerative changes at first TMT joint. Bulky Achilles insertion calcaneal spur. IMPRESSION: No acute osseous abnormalities. Soft tissue swelling LEFT foot with foci of gas lateral to the distal fifth metatarsal, question site of ulcer and or  infection by gas-forming organism. Findings called to Dr. Cyril Loosen on 07/01/2020 at 1739 hrs. Electronically Signed   By: Ulyses Southward M.D.   On: 07/01/2020 17:40     Subjective: Patient was feeling better when seen during morning rounds today.  Significant other was at bedside.  Discussed the need of changing dressing daily and she requested home health services which were ordered.  Discharge Exam: Vitals:   07/02/20 0818 07/02/20 1144  BP: 124/79 134/80  Pulse: 78 95  Resp: 20 18  Temp: 97.7 F (36.5 C) 97.8 F (36.6 C)  SpO2: 97% 97%   Vitals:   07/02/20 0445 07/02/20 0615 07/02/20 0818 07/02/20 1144  BP: (!) 119/59  124/79 134/80  Pulse: 84 80 78 95  Resp: 14 14 20 18   Temp:   97.7 F (36.5 C) 97.8 F (36.6 C)  TempSrc:   Oral Oral  SpO2: 96% 95% 97% 97%  Weight:      Height:        General: Pt is alert, awake, not in acute distress Cardiovascular: RRR, S1/S2 +, no rubs, no gallops Respiratory: CTA bilaterally, no wheezing, no rhonchi Abdominal: Soft, NT, ND, bowel sounds  + Extremities: no edema, no cyanosis, left foot with clean bandage.   The results of significant diagnostics from this hospitalization (including imaging, microbiology, ancillary and laboratory) are listed below for reference.    Microbiology: Recent Results (from the past 240 hour(s))  C Difficile Quick Screen w PCR reflex     Status: None   Collection Time: 07/01/20 11:39 AM   Specimen: STOOL  Result Value Ref Range Status   C Diff antigen NEGATIVE NEGATIVE Final   C Diff toxin NEGATIVE NEGATIVE Final   C Diff interpretation No C. difficile detected.  Final    Comment: Performed at [facility], [address] [place], [place] [address]  Wound or Superficial Culture     Status: None (Preliminary result)   Collection Time: 07/01/20  7:24 PM   Specimen: Foot; Wound  Result Value Ref Range Status   Specimen Description   Final    FOOT Performed at [facility], [address] [place], [place] [address]    Special Requests   Final    Normal Performed at [facility], [address] [place], [place] [address]    Gram Stain PENDING  Incomplete   Culture   Final    FEW GRAM NEGATIVE RODS CULTURE REINCUBATED FOR BETTER GROWTH Performed at [facility], [address], [place], [place] [address]    Report Status PENDING  Incomplete  Respiratory Panel by RT PCR (Flu A&B, Covid) - Nasopharyngeal Swab     Status: None   Collection Time: 07/01/20  8:00 PM   Specimen: Nasopharyngeal Swab  Result Value Ref Range Status   SARS Coronavirus 2 by RT PCR NEGATIVE NEGATIVE Final    Comment: (NOTE) SARS-CoV-2 target nucleic acids are NOT DETECTED.  The SARS-CoV-2 RNA is generally detectable in upper respiratoy specimens during the acute phase of infection. The lowest concentration of SARS-CoV-2 viral copies this assay can detect is 131 copies/mL. A negative result does not preclude SARS-Cov-2 infection and should not be used as the sole basis for  treatment or other patient management decisions. A negative result may occur with  improper specimen collection/handling, submission of specimen other than nasopharyngeal swab, presence of viral mutation(s) within the areas targeted by this assay, and inadequate number of viral copies (<131 copies/mL). A  negative result must be combined with clinical observations, patient history, and epidemiological information. The expected result is Negative.  Fact Sheet for Patients:  https://www.moore.com/  Fact Sheet for Healthcare Providers:  https://www.young.biz/  This test is no t yet approved or cleared by the [place] FDA and  has been authorized for detection and/or diagnosis of SARS-CoV-2 by FDA under an Emergency Use Authorization (EUA). This EUA will remain  in effect (meaning this test can be used) for the duration of the COVID-19 declaration under Section 564(b)(1) of the Act, 21 U.S.C. section 360bbb-3(b)(1), unless the authorization is terminated or revoked sooner.     Influenza A by PCR NEGATIVE NEGATIVE Final   Influenza B by PCR NEGATIVE NEGATIVE Final    Comment: (NOTE) The Xpert Xpress SARS-CoV-2/FLU/RSV assay is intended as an aid in  the diagnosis of influenza from Nasopharyngeal swab specimens and  should not be used as a sole basis for treatment. Nasal washings and  aspirates are unacceptable for Xpert Xpress SARS-CoV-2/FLU/RSV  testing.  Fact Sheet for Patients: https://www.moore.com/  Fact Sheet for Healthcare Providers: https://www.young.biz/  This test is not yet approved or cleared by the [place] FDA and  has been authorized for detection and/or diagnosis of SARS-CoV-2 by  FDA under an Emergency Use Authorization (EUA). This EUA will remain  in effect (meaning this test can be used) for the duration of the  Covid-19 declaration under Section 564(b)(1) of the Act, 21   U.S.C. section 360bbb-3(b)(1), unless the authorization is  terminated or revoked. Performed at [facility], [address] [place], [place] [address]   Blood culture (routine x 2)     Status: None (Preliminary result)   Collection Time: 07/01/20  8:14 PM   Specimen: BLOOD  Result Value Ref Range Status   Specimen Description BLOOD LEFT ANTECUBITAL  Final   Special Requests   Final    BOTTLES DRAWN AEROBIC AND ANAEROBIC Blood Culture adequate volume   Culture   Final    NO GROWTH < 12 HOURS Performed at [facility], [address], [place], [place] [address]    Report Status PENDING  Incomplete  Blood culture (routine x 2)     Status: None (Preliminary result)   Collection Time: 07/01/20  8:19 PM   Specimen: BLOOD  Result Value Ref Range Status   Specimen Description BLOOD RIGHT ANTECUBITAL  Final   Special Requests   Final    BOTTLES DRAWN AEROBIC AND ANAEROBIC Blood Culture adequate volume   Culture   Final    NO GROWTH < 12 HOURS Performed at [facility], [address] [place], [place] [address]    Report Status PENDING  Incomplete  MRSA PCR Screening     Status: None   Collection Time: 07/02/20  1:21 AM   Specimen: Nasal Mucosa; Nasopharyngeal  Result Value Ref Range Status   MRSA by PCR NEGATIVE NEGATIVE Final    Comment:        The GeneXpert MRSA Assay (FDA approved for NASAL specimens only), is one component of a comprehensive MRSA colonization surveillance program. It is not intended to diagnose MRSA infection nor to guide or monitor treatment for MRSA infections. Performed at [facility], [address] [place], [place] [address]      Labs: BNP (last 3 results) No results for input(s): BNP in the last 8760 hours. Basic Metabolic Panel: Recent Labs  Lab 06/25/20 1545 07/01/20 1640 07/02/20 0436  NA 136 133* 133*  K 5.0 3.8 4.1  CL 99 96* 99  CO2 30 24 26   GLUCOSE 103* 169* 123*  BUN 36* 33* 26*   CREATININE 1.79* 1.41* 1.29*  CALCIUM 8.9 8.8* 8.6*   Liver Function Tests: Recent Labs  Lab 06/25/20 1545 07/01/20 1640 07/02/20 0436  AST 20 36 42*  ALT 24 32 41  ALKPHOS 71 86 85  BILITOT 0.7 1.1 0.9  PROT 7.0 6.9 6.4*  ALBUMIN 3.7 3.3* 3.1*   No results for input(s): LIPASE, AMYLASE in the last 168 hours. No results for input(s): AMMONIA in the last 168 hours. CBC: Recent Labs  Lab 06/25/20 1545 07/01/20 1640 07/02/20 0436  WBC 9.1 13.3* 9.8  NEUTROABS 5.9 10.5*  --   HGB 14.2 12.7* 11.5*  HCT 43.4 39.2 35.5*  MCV 90.4 89.9 91.3  PLT 244 215 217   Cardiac Enzymes: No results for input(s): CKTOTAL, CKMB, CKMBINDEX, TROPONINI in the last 168 hours. BNP: Invalid input(s): POCBNP CBG: Recent Labs  Lab 07/01/20 1639  GLUCAP 151*   D-Dimer No results for input(s): DDIMER in the last 72 hours. Hgb A1c Recent Labs    07/01/20 1640  HGBA1C 6.3*   Lipid Profile No results for input(s): CHOL, HDL, LDLCALC, TRIG, CHOLHDL, LDLDIRECT in the last 72 hours. Thyroid function studies No results for input(s): TSH, T4TOTAL, T3FREE, THYROIDAB in the last 72 hours.  Invalid input(s): FREET3 Anemia work up No results for input(s): VITAMINB12, FOLATE, FERRITIN, TIBC, IRON, RETICCTPCT in the last 72 hours. Urinalysis    Component Value Date/Time   COLORURINE STRAW (A) 07/01/2020 1920   APPEARANCEUR CLEAR (A) 07/01/2020 1920   APPEARANCEUR Clear 04/21/2018 1417   LABSPEC 1.006 07/01/2020 1920   PHURINE 5.0 07/01/2020 1920   GLUCOSEU NEGATIVE 07/01/2020 1920   HGBUR NEGATIVE 07/01/2020 1920   BILIRUBINUR NEGATIVE 07/01/2020 1920   BILIRUBINUR Negative 04/21/2018 1417   KETONESUR NEGATIVE 07/01/2020 1920   PROTEINUR NEGATIVE 07/01/2020 1920   UROBILINOGEN 0.2 06/02/2014 1443   NITRITE NEGATIVE 07/01/2020 1920   LEUKOCYTESUR NEGATIVE 07/01/2020 1920   Sepsis Labs Invalid input(s): PROCALCITONIN,  WBC,  LACTICIDVEN Microbiology Recent Results (from the past 240  hour(s))  C Difficile Quick Screen w PCR reflex     Status: None   Collection Time: 07/01/20 11:39 AM   Specimen: STOOL  Result Value Ref Range Status   C Diff antigen NEGATIVE NEGATIVE Final   C Diff toxin NEGATIVE NEGATIVE Final   C Diff interpretation No C. difficile detected.  Final    Comment: Performed at [facility], [address] [place], [place]  Wound or Superficial Culture     Status: None (Preliminary result)   Collection Time: 07/01/20  7:24 PM   Specimen: Foot; Wound  Result Value Ref Range Status   Specimen Description   Final    FOOT Performed at [facility], [address], [place], [place]    Special Requests   Final    Normal Performed at [facility], [address] [place], [place]    Gram Stain PENDING  Incomplete   Culture   Final    FEW GRAM NEGATIVE RODS CULTURE REINCUBATED FOR BETTER GROWTH Performed at [facility] [facility], [address] [place], [place]    Report Status PENDING  Incomplete  Respiratory Panel by RT PCR (Flu A&B, Covid) - Nasopharyngeal Swab     Status: None   Collection Time: 07/01/20  8:00 PM  Specimen: Nasopharyngeal Swab  Result Value Ref Range Status   SARS Coronavirus 2 by RT PCR NEGATIVE NEGATIVE Final    Comment: (NOTE) SARS-CoV-2 target nucleic acids are NOT DETECTED.  The SARS-CoV-2 RNA is generally detectable in upper respiratoy specimens during the acute phase of infection. The lowest concentration of SARS-CoV-2 viral copies this assay can detect is 131 copies/mL. A negative result does not preclude SARS-Cov-2 infection and should not be used as the sole basis for treatment or other patient management decisions. A negative result may occur with  improper specimen collection/handling, submission of specimen other than nasopharyngeal swab, presence of viral mutation(s) within the areas targeted by this assay, and inadequate number of viral  copies (<131 copies/mL). A negative result must be combined with clinical observations, patient history, and epidemiological information. The expected result is Negative.  Fact Sheet for Patients:  https://www.moore.com/  Fact Sheet for Healthcare Providers:  https://www.young.biz/  This test is no t yet approved or cleared by the [place] FDA and  has been authorized for detection and/or diagnosis of SARS-CoV-2 by FDA under an Emergency Use Authorization (EUA). This EUA will remain  in effect (meaning this test can be used) for the duration of the COVID-19 declaration under Section 564(b)(1) of the Act, 21 U.S.C. section 360bbb-3(b)(1), unless the authorization is terminated or revoked sooner.     Influenza A by PCR NEGATIVE NEGATIVE Final   Influenza B by PCR NEGATIVE NEGATIVE Final    Comment: (NOTE) The Xpert Xpress SARS-CoV-2/FLU/RSV assay is intended as an aid in  the diagnosis of influenza from Nasopharyngeal swab specimens and  should not be used as a sole basis for treatment. Nasal washings and  aspirates are unacceptable for Xpert Xpress SARS-CoV-2/FLU/RSV  testing.  Fact Sheet for Patients: https://www.moore.com/  Fact Sheet for Healthcare Providers: https://www.young.biz/  This test is not yet approved or cleared by the [place] FDA and  has been authorized for detection and/or diagnosis of SARS-CoV-2 by  FDA under an Emergency Use Authorization (EUA). This EUA will remain  in effect (meaning this test can be used) for the duration of the  Covid-19 declaration under Section 564(b)(1) of the Act, 21  U.S.C. section 360bbb-3(b)(1), unless the authorization is  terminated or revoked. Performed at [facility], [address] [place], [place] [address]   Blood culture (routine x 2)     Status: None (Preliminary result)   Collection Time: 07/01/20  8:14 PM   Specimen:  BLOOD  Result Value Ref Range Status   Specimen Description BLOOD LEFT ANTECUBITAL  Final   Special Requests   Final    BOTTLES DRAWN AEROBIC AND ANAEROBIC Blood Culture adequate volume   Culture   Final    NO GROWTH < 12 HOURS Performed at [facility], [address], [place], [place] [address]    Report Status PENDING  Incomplete  Blood culture (routine x 2)     Status: None (Preliminary result)   Collection Time: 07/01/20  8:19 PM   Specimen: BLOOD  Result Value Ref Range Status   Specimen Description BLOOD RIGHT ANTECUBITAL  Final   Special Requests   Final    BOTTLES DRAWN AEROBIC AND ANAEROBIC Blood Culture adequate volume   Culture   Final    NO GROWTH < 12 HOURS Performed at [facility], [address] [place], [place] [address]    Report Status PENDING  Incomplete  MRSA PCR Screening     Status: None  Collection Time: 07/02/20  1:21 AM   Specimen: Nasal Mucosa; Nasopharyngeal  Result Value Ref Range Status   MRSA by PCR NEGATIVE NEGATIVE Final    Comment:        The GeneXpert MRSA Assay (FDA approved for NASAL specimens only), is one component of a comprehensive MRSA colonization surveillance program. It is not intended to diagnose MRSA infection nor to guide or monitor treatment for MRSA infections. Performed at [facility], [address] [place], [place] [address]     Time coordinating discharge: Over 30 minutes  SIGNED:  Arnetha Courser, MD  [facility] 07/02/2020, 12:54 PM  If 7PM-7AM, please contact night-coverage [email or web address]  This record has been created using Conservation officer, historic buildings. Errors have been sought and corrected,but may not always be located. Such creation errors do not reflect on the standard of care.

## 2020-07-02 NOTE — Consult Note (Signed)
Reason for Consult: Abscess left foot Referring Physician: Cox  [name] [name] is an 79 y.o. male.  HPI: This is a 79 year old diabetic male with recent complaints of severe back pain.  Recently he has been wearing some compression stockings on his left leg and when he took these off for bathing yesterday noticed a large blister with some redness and drainage on his left foot.  Patient is diabetic and presented to the emergency department where he was admitted for sepsis..  Past Medical History:  Diagnosis Date  . AAA (abdominal aortic aneurysm) ([place])   . ACE inhibitor associated hyperkalemia 09/20/2015  . Arthritis   . CAD (coronary artery disease)    a. 03/2014 NSTEMI--> 05/2014 CABG x 5 (LIMA->D1, VG->LAD, VG->OM1, VG->OM2, VG->RPDA);  c. 04/2015 Cath: LM nl, LAD 100ost, 40d, RI 80, LCX 80p/m, OM1 70, OM2 80, RCA 20p, 53m 90d, VG->dLAD min irregs, VG->OM1 100, VG->OM2 100, VG->RPDA nl, LIMA->D2 nl.  . Chronic systolic CHF (congestive heart failure) ([place]    a. EF 25-30% by 2D ECHO 04/20/14. Severely dec global hypokinesis. mildly elevated RVSP;  b. 12/2014 Echo: EF 40-45%, mod dil LA, mildly dil RV/RA;  c. 04/2015 Echo: EF 30-35%, sev antsept HK, Gr1 DD, mildly dil RA/LA;  d. 07/2015 Echo: EF 35-40%, ant/antsept HK, Gr1 DD, mildly red RV fxn.  . Diabetes mellitus without complication ([place]   . Epigastric hernia   . Frequent PVCs    a. 24 hr Holter 06/2015: >28K PVCs accounting for 25% of all beats, rare PAC-->mexilitene started;  b. 07/2015 Holter: 1700 PVC's/24 hrs.  .Marland KitchenGERD (gastroesophageal reflux disease)   . Gout   . Hyperlipidemia   . Hypertension   . Ischemic cardiomyopathy    a. EF 25-30% 03/2014;  b. EF 40-45% 12/2014.  . Mitral regurgitation    a. 03/2014 TEE: mild to mod MR.  . Nephrolithiasis   . Neuromuscular disorder ([place]    DIABETIC NEUROPATHY  . Obesity   . Umbilical hernia     Past Surgical History:  Procedure Laterality Date  . CARDIAC CATHETERIZATION     [facility]    . CARDIAC CATHETERIZATION N/A 05/23/2015   Procedure: Left Heart Cath and Coronary Angiography;  Surgeon: MWellington Hampshire MD;  [facility];  Service: Cardiovascular;  Laterality: N/A;  . CARDIOVERSION N/A 01/30/2020   Procedure: CARDIOVERSION;  Surgeon: AWellington Hampshire MD;  Location: [facility] ORS;  Service: Cardiovascular;  Laterality: N/A;  . CARPAL TUNNEL RELEASE Right   . CORONARY ARTERY BYPASS GRAFT N/A 06/06/2014   Procedure: CORONARY ARTERY BYPASS GRAFTING (CABG) x 5 USING LEFT AND RIGHT SAPHENOUS LEG VEIN HARVESTED ENDOSCOPICALLY;  Surgeon: PIvin Poot MD;  [place]  Service: Open Heart Surgery;  Laterality: N/A;  . ESOPHAGOGASTRODUODENOSCOPY (EGD) WITH PROPOFOL N/A 02/11/2016   Procedure: ESOPHAGOGASTRODUODENOSCOPY (EGD) WITH PROPOFOL with dialation;  Surgeon: DLucilla Lame MD;  [place]  Service: Endoscopy;  Laterality: N/A;  diabetic, oral meds  . INTRAOPERATIVE TRANSESOPHAGEAL ECHOCARDIOGRAM N/A 06/06/2014   Procedure: INTRAOPERATIVE TRANSESOPHAGEAL ECHOCARDIOGRAM;  Surgeon: PIvin Poot MD;  [facility]  Service: Open Heart Surgery;  Laterality: N/A;  . ROTATOR CUFF REPAIR Right 1996,1997,2000  . TEE WITHOUT CARDIOVERSION N/A 04/24/2014   Procedure: TRANSESOPHAGEAL ECHOCARDIOGRAM (TEE);  Surgeon: PThayer Headings MD;  [facility];  Service: Cardiovascular;  Laterality: N/A;    Family History  Problem Relation Age of Onset  . Throat cancer Father   . Stroke Father   .  Colon polyps Brother   . Lung cancer Brother   . Prostate cancer Neg Hx   . Bladder Cancer Neg Hx   . Kidney cancer Neg Hx     Social History:  reports that he has quit smoking. His smoking use included cigarettes. He has a 25.00 pack-year smoking history. He has quit using smokeless tobacco.  His smokeless tobacco use included chew. He reports that he does not drink alcohol and does not use drugs.  Allergies:  Allergies  Allergen Reactions   . Glimepiride     Unusual sweating and irregular heart beat    Medications:  Scheduled: . atorvastatin  40 mg Oral Daily  . carvedilol  12.5 mg Oral BID WC  . enoxaparin (LOVENOX) injection  40 mg Subcutaneous Q24H  . gabapentin  300 mg Oral QID  . sacubitril-valsartan  1 tablet Oral BID    Results for orders placed or performed during the hospital encounter of 07/01/20 (from the past 48 hour(s))  CBG monitoring, ED     Status: Abnormal   Collection Time: 07/01/20  4:39 PM  Result Value Ref Range   Glucose-Capillary 151 (H) 70 - 99 mg/dL    Comment: Glucose reference range applies only to samples taken after fasting for at least 8 hours.  Lactic acid, plasma     Status: None   Collection Time: 07/01/20  4:40 PM  Result Value Ref Range   Lactic Acid, Venous 1.3 0.5 - 1.9 mmol/L    Comment: Performed at [facility], [address] [place], [place] [address]  Comprehensive metabolic panel     Status: Abnormal   Collection Time: 07/01/20  4:40 PM  Result Value Ref Range   Sodium 133 (L) 135 - 145 mmol/L   Potassium 3.8 3.5 - 5.1 mmol/L   Chloride 96 (L) 98 - 111 mmol/L   CO2 24 22 - 32 mmol/L   Glucose, Bld 169 (H) 70 - 99 mg/dL    Comment: Glucose reference range applies only to samples taken after fasting for at least 8 hours.   BUN 33 (H) 8 - 23 mg/dL   Creatinine, Ser 1.41 (H) 0.61 - 1.24 mg/dL   Calcium 8.8 (L) 8.9 - 10.3 mg/dL   Total Protein 6.9 6.5 - 8.1 g/dL   Albumin 3.3 (L) 3.5 - 5.0 g/dL   AST 36 15 - 41 U/L   ALT 32 0 - 44 U/L   Alkaline Phosphatase 86 38 - 126 U/L   Total Bilirubin 1.1 0.3 - 1.2 mg/dL   GFR, Estimated 51 (L) >60 mL/min    Comment: (NOTE) Calculated using the CKD-EPI Creatinine Equation (2021)    Anion gap 13 5 - 15    Comment: Performed at [facility], [address] [place],  [address]  CBC with Differential     Status: Abnormal   Collection Time: 07/01/20  4:40 PM  Result Value Ref Range   WBC 13.3 (H)  4.0 - 10.5 K/uL   RBC 4.36 4.22 - 5.81 MIL/uL   Hemoglobin 12.7 (L) 13.0 - 17.0 g/dL   HCT 39.2 39 - 52 %   MCV 89.9 80.0 - 100.0 fL   MCH 29.1 26.0 - 34.0 pg   MCHC 32.4 30.0 - 36.0 g/dL   RDW 12.4 11.5 - 15.5 %   Platelets 215 150 - 400 K/uL   nRBC 0.0 0.0 - 0.2 %   Neutrophils Relative % 80 %   Neutro Abs 10.5 (H)  1.7 - 7.7 K/uL   Lymphocytes Relative 11 %   Lymphs Abs 1.5 0.7 - 4.0 K/uL   Monocytes Relative 7 %   Monocytes Absolute 0.9 0.1 - 1.0 K/uL   Eosinophils Relative 2 %   Eosinophils Absolute 0.3 0.0 - 0.5 K/uL   Basophils Relative 0 %   Basophils Absolute 0.1 0.0 - 0.1 K/uL   Immature Granulocytes 0 %   Abs Immature Granulocytes 0.05 0.00 - 0.07 K/uL    Comment: Performed at [facility], [address] [place], [place] [address]  ESR     Status: Abnormal   Collection Time: 07/01/20  4:40 PM  Result Value Ref Range   Sed Rate 46 (H) 0 - 20 mm/hr    Comment: Performed at [facility], [address] [place], [place] [address]  Lactic acid, plasma     Status: None   Collection Time: 07/01/20  7:20 PM  Result Value Ref Range   Lactic Acid, Venous 0.8 0.5 - 1.9 mmol/L    Comment: Performed at [facility], [address] [place], [place] [address]  Urinalysis, Complete w Microscopic Foot, Left     Status: Abnormal   Collection Time: 07/01/20  7:20 PM  Result Value Ref Range   Color, Urine STRAW (A) YELLOW   APPearance CLEAR (A) CLEAR   Specific Gravity, Urine 1.006 1.005 - 1.030   pH 5.0 5.0 - 8.0   Glucose, UA NEGATIVE NEGATIVE mg/dL   Hgb urine dipstick NEGATIVE NEGATIVE   Bilirubin Urine NEGATIVE NEGATIVE   Ketones, ur NEGATIVE NEGATIVE mg/dL   Protein, ur NEGATIVE NEGATIVE mg/dL   Nitrite NEGATIVE NEGATIVE   Leukocytes,Ua NEGATIVE NEGATIVE   RBC / HPF 0-5 0 - 5 RBC/hpf   WBC, UA 0-5 0 - 5 WBC/hpf   Bacteria, UA NONE SEEN NONE SEEN   Squamous Epithelial / LPF 0-5 0 - 5   Mucus PRESENT    Hyaline Casts, UA PRESENT      Comment: Performed at [facility], [address] [place],  [address]  C-reactive protein     Status: Abnormal   Collection Time: 07/01/20  7:24 PM  Result Value Ref Range   CRP 20.2 (H) <1.0 mg/dL    Comment: Performed at [facility], [address], [place],  [address]  Respiratory Panel by RT PCR (Flu A&B, Covid) - Nasopharyngeal Swab     Status: None   Collection Time: 07/01/20  8:00 PM   Specimen: Nasopharyngeal Swab  Result Value Ref Range   SARS Coronavirus 2 by RT PCR NEGATIVE NEGATIVE    Comment: (NOTE) SARS-CoV-2 target nucleic acids are NOT DETECTED.  The SARS-CoV-2 RNA is generally detectable in upper respiratoy specimens during the acute phase of infection. The lowest concentration of SARS-CoV-2 viral copies this assay can detect is 131 copies/mL. A negative result does not preclude SARS-Cov-2 infection and should not be used as the sole basis for treatment or other patient management decisions. A negative result may occur with  improper specimen collection/handling, submission of specimen other than nasopharyngeal swab, presence of viral mutation(s) within the areas targeted by this assay, and inadequate number of viral copies (<131 copies/mL). A negative result must be combined with clinical observations, patient history, and epidemiological information. The expected result is Negative.  Fact Sheet for Patients:  PinkCheek.be  Fact Sheet for Healthcare Providers:  GravelBags.it  This test is no t yet approved or cleared by the [place]  FDA and  has been authorized for detection and/or diagnosis of SARS-CoV-2 by FDA under an Emergency Use Authorization (EUA). This EUA will remain  in effect (meaning this test can be used) for the duration of the COVID-19 declaration under Section 564(b)(1) of the Act, 21 U.S.C. section 360bbb-3(b)(1), unless the authorization is  terminated or revoked sooner.     Influenza A by PCR NEGATIVE NEGATIVE   Influenza B by PCR NEGATIVE NEGATIVE    Comment: (NOTE) The Xpert Xpress SARS-CoV-2/FLU/RSV assay is intended as an aid in  the diagnosis of influenza from Nasopharyngeal swab specimens and  should not be used as a sole basis for treatment. Nasal washings and  aspirates are unacceptable for Xpert Xpress SARS-CoV-2/FLU/RSV  testing.  Fact Sheet for Patients: PinkCheek.be  Fact Sheet for Healthcare Providers: GravelBags.it  This test is not yet approved or cleared by the [place] FDA and  has been authorized for detection and/or diagnosis of SARS-CoV-2 by  FDA under an Emergency Use Authorization (EUA). This EUA will remain  in effect (meaning this test can be used) for the duration of the  Covid-19 declaration under Section 564(b)(1) of the Act, 21  U.S.C. section 360bbb-3(b)(1), unless the authorization is  terminated or revoked. Performed at [facility], [address] [place], [place] [address]   Blood culture (routine x 2)     Status: None (Preliminary result)   Collection Time: 07/01/20  8:14 PM   Specimen: BLOOD  Result Value Ref Range   Specimen Description BLOOD LEFT ANTECUBITAL    Special Requests      BOTTLES DRAWN AEROBIC AND ANAEROBIC Blood Culture adequate volume   Culture      NO GROWTH < 12 HOURS Performed at [facility], [address] [place], [place] [address]    Report Status PENDING   Blood culture (routine x 2)     Status: None (Preliminary result)   Collection Time: 07/01/20  8:19 PM   Specimen: BLOOD  Result Value Ref Range   Specimen Description BLOOD RIGHT ANTECUBITAL    Special Requests      BOTTLES DRAWN AEROBIC AND ANAEROBIC Blood Culture adequate volume   Culture      NO GROWTH < 12 HOURS Performed at [facility], [address], [place], [place] [address]    Report  Status PENDING   MRSA PCR Screening     Status: None   Collection Time: 07/02/20  1:21 AM   Specimen: Nasal Mucosa; Nasopharyngeal  Result Value Ref Range   MRSA by PCR NEGATIVE NEGATIVE    Comment:        The GeneXpert MRSA Assay (FDA approved for NASAL specimens only), is one component of a comprehensive MRSA colonization surveillance program. It is not intended to diagnose MRSA infection nor to guide or monitor treatment for MRSA infections. Performed at [facility], [address] [place],  [address]   Comprehensive metabolic panel     Status: Abnormal   Collection Time: 07/02/20  4:36 AM  Result Value Ref Range   Sodium 133 (L) 135 - 145 mmol/L   Potassium 4.1 3.5 - 5.1 mmol/L   Chloride 99 98 - 111 mmol/L   CO2 26 22 - 32 mmol/L   Glucose, Bld 123 (H) 70 - 99 mg/dL    Comment: Glucose reference range applies only to samples taken after fasting for at least 8 hours.   BUN 26 (H) 8 - 23 mg/dL   Creatinine,  Ser 1.29 (H) 0.61 - 1.24 mg/dL   Calcium 8.6 (L) 8.9 - 10.3 mg/dL   Total Protein 6.4 (L) 6.5 - 8.1 g/dL   Albumin 3.1 (L) 3.5 - 5.0 g/dL   AST 42 (H) 15 - 41 U/L   ALT 41 0 - 44 U/L   Alkaline Phosphatase 85 38 - 126 U/L   Total Bilirubin 0.9 0.3 - 1.2 mg/dL   GFR, Estimated 57 (L) >60 mL/min    Comment: (NOTE) Calculated using the CKD-EPI Creatinine Equation (2021)    Anion gap 8 5 - 15    Comment: Performed at [facility], [address] [place], [place] [address]  CBC     Status: Abnormal   Collection Time: 07/02/20  4:36 AM  Result Value Ref Range   WBC 9.8 4.0 - 10.5 K/uL   RBC 3.89 (L) 4.22 - 5.81 MIL/uL   Hemoglobin 11.5 (L) 13.0 - 17.0 g/dL   HCT 35.5 (L) 39 - 52 %   MCV 91.3 80.0 - 100.0 fL   MCH 29.6 26.0 - 34.0 pg   MCHC 32.4 30.0 - 36.0 g/dL   RDW 12.6 11.5 - 15.5 %   Platelets 217 150 - 400 K/uL   nRBC 0.0 0.0 - 0.2 %    Comment: Performed at [facility], [address] [place], [place] [address]     DG Foot Complete Left  Result Date: 07/01/2020 CLINICAL DATA:  Wound at LEFT fifth metatarsal EXAM: LEFT FOOT - COMPLETE 3+ VIEW COMPARISON:  12/03/2011 FINDINGS: Foci of soft tissue gas lateral to the distal LEFT fifth metatarsal. Diffuse soft tissue swelling LEFT foot. Osseous demineralization. Joint spaces preserved. No acute fracture, dislocation, or bone destruction. Degenerative changes at first TMT joint. Bulky Achilles insertion calcaneal spur. IMPRESSION: No acute osseous abnormalities. Soft tissue swelling LEFT foot with foci of gas lateral to the distal fifth metatarsal, question site of ulcer and or infection by gas-forming organism. Findings called to Dr. Corky Downs on 07/01/2020 at 1739 hrs. Electronically Signed   By: Lavonia Dana M.D.   On: 07/01/2020 17:40    Review of Systems  Constitutional: Negative for chills and fever.  HENT: Negative for sinus pain and sore throat.   Respiratory: Negative for cough and shortness of breath.   Cardiovascular: Negative for chest pain and palpitations.  Gastrointestinal: Negative for nausea and vomiting.  Endocrine: Negative for polydipsia and polyuria.  Genitourinary: Negative for frequency and urgency.  Musculoskeletal:       Chronic back pain recently exacerbated.  Does not relate any pain with the foot.  Has been on pain medication.  Skin:       Patient relates recent redness with a blister on his left foot after using some compression stockings.  Some recent drainage.  Neurological:       Does not relate any numbness or paresthesias due to his diabetes.  Psychiatric/Behavioral: Negative for confusion. The patient is not nervous/anxious.    Blood pressure 124/79, pulse 78, temperature 97.7 F (36.5 C), temperature source Oral, resp. rate 20, height 5' 9"  (1.753 m), weight 113.4 kg, SpO2 97 %. Physical Exam Cardiovascular:     Comments: DP and PT pulses are diminished but palpable bilateral. Musculoskeletal:     Comments: Adequate  range of motion.  Muscle testing deferred.  Skin:    Comments: The skin is warm dry and supple.  Significant edema and erythema in the left foot as compared to the right.  A large  abscess is noted on the lateral aspect of the left foot with significant underlying necrotic tissue.  Underlying superficial ulceration approximately 5 cm x 4 cm with a granular base.  Also a plantar hyperkeratotic lesion on the left fifth metatarsal intact predebridement.  Post debridement there is a full-thickness ulcerative area centrally with central fibrotic tissue measuring approximately 5 mm diameter.  Granular base following debridement.  No clear deep extension towards the bone.  Neurological:     Comments: There is some loss of protective threshold with a monofilament wire more so on the toes of the left foot than on the right.  Intact in the midfoot.         Assessment/Plan: Assessment: 1.  Abscess with underlying gas per x-ray left foot. 2.  Large superficial ulceration left foot. 3.  Full-thickness ulceration plantar fifth metatarsal. 4.  Diabetes with some degree of neuropathy. 5.  Cellulitis left foot.  Plan: Excisional debridement of all necrotic and devitalized tissue from the ulceration on the left foot using tissue nippers including the epidermal and dermal layers as well as underlying necrosis and abscess.  Debrided devitalized tissue from the ulceration on the plantar aspect of the fifth metatarsal full-thickness using tissue nippers including the epidermal and dermal layers as well as deeper subcutaneous fibrous tissue.  Betadine and a sterile bandage applied.  A culture was taken of the abscess along the dorsal wound.  Plan to obtain MRI to evaluate for any deeper abscess or infection but at this point hopefully all of the gas noted on x-rays was in the superficial tissues.  Follow-up closely with the patient while he is in the hospital.  Discussed with the patient that pending the MRI findings  that surgical debridement is not out of the question yet.  Durward Fortes 07/02/2020, 8:37 AM
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
## 2020-07-02 NOTE — ED Notes (Signed)
PT awake and using urinal at this time.

## 2020-07-02 NOTE — TOC Initial Note (Signed)
Transition of Care [facility]) - Initial/Assessment Note    Patient Details  Name: [name] [name] MRN: [id number] Date of Birth: 03-Jan-1941  Transition of [facility]) CM/SW Contact:    Liliana Cline, LCSW Phone Number: 07/02/2020, 2:29 PM  Clinical Narrative:                CSW spoke with patient and patient's significant other Aggie Cosier. Patient lives alone at [address] [place], [place]. PCP is Dr. [name]. Pharmacy is [facility]. Patient drives himself to appointments and has a RW and cane at home.   Patient and Aggie Cosier agreeable to Dr. Shanda Bumps recommendation for [facility] services for dressing changes. Explained agency options and they denied a preference in agency. Referral made to [facility] who accepted patient, she is aware patient is discharging today.  No additional needs identified.   Expected Discharge Plan: Home w Home Health Services Barriers to Discharge: Barriers Resolved   Patient Goals and CMS Choice Patient states their goals for this hospitalization and ongoing recovery are:: home with [facility] for dressing changes CMS Medicare.gov Compare Post Acute Care list provided to:: Patient Choice offered to / list presented to : Patient, Spouse  Expected Discharge Plan and Services Expected Discharge Plan: Home w Home Health Services       Living arrangements for the past 2 months: Single Family Home Expected Discharge Date: 07/02/20                         HH Arranged: RN HH Agency: [facility] Date [facility] Agency Contacted: 07/02/20   Representative spoke with at [facility] Agency: Evette Cristal  Prior Living Arrangements/Services Living arrangements for the past 2 months: Single Family Home Lives with:: Self Patient language and need for interpreter reviewed:: Yes Do you feel safe going back to the place where you live?: Yes      Need for Family Participation in Patient Care: Yes (Comment) Care giver support system in place?: Yes  (comment) Current home services: DME Criminal Activity/Legal Involvement Pertinent to Current Situation/Hospitalization: No - Comment as needed  Activities of Daily Living      Permission Sought/Granted Permission sought to share information with : Oceanographer granted to share information with : Yes, Verbal Permission Granted     Permission granted to share info w AGENCY: HH agencies        Emotional Assessment       Orientation: : Oriented to Situation, Oriented to  Time, Oriented to Place, Oriented to Self Alcohol / Substance Use: Not Applicable Psych Involvement: No (comment)  Admission diagnosis:  Wound infection [T14.8XXA, L08.9] Cellulitis and abscess of toe of left foot [L03.032, L02.612] Patient Active Problem List   Diagnosis Date Noted  . Wound infection   . Cellulitis and abscess of toe of left foot 07/01/2020  . Staghorn calculus 07/01/2020  . A-fib (HCC) 01/01/2020  . Arthritis 05/13/2016  . Kidney stones 05/13/2016  . Renal cyst 05/13/2016  . Thoracic spine fracture (HCC) 05/13/2016  . Ventral hernia 05/13/2016  . Type 2 diabetes mellitus with diabetic polyneuropathy, without long-term current use of insulin (HCC) 03/03/2016  . Vertigo 03/03/2016  . Low back pain 03/03/2016  . Problems with swallowing and mastication   . Gastritis   . Other specified diseases of esophagus   . Morbid obesity with BMI of 40.0-44.9, adult (HCC) 11/27/2015  . ACE inhibitor associated hyperkalemia 09/20/2015  . Weakness of  hand -bilateral 09/20/2015  . Cardiomyopathy, ischemic 06/13/2015  . Coronary artery disease involving native coronary artery with other forms of angina pectoris   . AAA (abdominal aortic aneurysm) (HCC) 04/19/2015  . Right foot ulcer (HCC) 01/16/2015  . 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 CAD (coronary artery disease)   . Chronic systolic CHF (congestive heart failure) (HCC)   . Obesity   . Gout   . Hyperlipidemia   .  Hypertension   . Diastasis, muscle 01/12/2014  . Umbilical hernia 01/12/2014  . Epigastric hernia 01/12/2014  . Morbid obesity (HCC) 01/12/2014   PCP:  [name], MD Pharmacy:   [facility] [address], [place] - [address] [place] [address] Phone: [phone] Fax: [phone]     Social Determinants of Health (SDOH) Interventions    Readmission Risk Interventions No flowsheet data found.

## 2020-07-02 NOTE — ED Notes (Signed)
Pt resting in bed with lights dimmed and call bell in reach. Urinal at bedside. NAD noted at this time.

## 2020-07-03 ENCOUNTER — Other Ambulatory Visit: Payer: Self-pay | Admitting: Cardiovascular Disease

## 2020-07-03 LAB — AEROBIC CULTURE W GRAM STAIN (SUPERFICIAL SPECIMEN): Special Requests: NORMAL

## 2020-07-04 ENCOUNTER — Telehealth: Payer: Self-pay | Admitting: Cardiovascular Disease

## 2020-07-04 DIAGNOSIS — T2124XD Burn of second degree of lower back, subsequent encounter: Secondary | ICD-10-CM | POA: Diagnosis not present

## 2020-07-04 DIAGNOSIS — I4891 Unspecified atrial fibrillation: Secondary | ICD-10-CM

## 2020-07-04 DIAGNOSIS — M7989 Other specified soft tissue disorders: Secondary | ICD-10-CM | POA: Diagnosis not present

## 2020-07-04 DIAGNOSIS — S91302S Unspecified open wound, left foot, sequela: Secondary | ICD-10-CM | POA: Diagnosis not present

## 2020-07-04 DIAGNOSIS — E1159 Type 2 diabetes mellitus with other circulatory complications: Secondary | ICD-10-CM | POA: Diagnosis not present

## 2020-07-04 NOTE — Telephone Encounter (Signed)
Looks like Sherryll Burger was added to patients medication list at last hospital visit - 07/01/2020.  Patient has a follow up appt with Dr. Kirke Corin 08/02/2020.  Would it be okay to refill this medication under his name or should we wait until patient comes in?

## 2020-07-04 NOTE — Telephone Encounter (Signed)
*  STAT* If patient is at the pharmacy, call can be transferred to refill team.   1. Which medications need to be refilled? (please list name of each medication and dose if known)   Entresto 24-26 mg po BID   2. Which pharmacy/location (including street and city if local pharmacy) is medication to be sent to?  [facility] [address] [place]   3. Do they need a 30 day or 90 day supply? 90

## 2020-07-04 NOTE — Telephone Encounter (Signed)
Patient was prescribed Entresto at hospital d/c on 07/02/20 I do not see any f/u labs ordered.  Message fwd to Dr. Kirke Corin for approval to refill.

## 2020-07-05 DIAGNOSIS — L02619 Cutaneous abscess of unspecified foot: Secondary | ICD-10-CM | POA: Diagnosis not present

## 2020-07-05 DIAGNOSIS — L03119 Cellulitis of unspecified part of limb: Secondary | ICD-10-CM | POA: Diagnosis not present

## 2020-07-05 DIAGNOSIS — L97521 Non-pressure chronic ulcer of other part of left foot limited to breakdown of skin: Secondary | ICD-10-CM | POA: Diagnosis not present

## 2020-07-05 DIAGNOSIS — E1159 Type 2 diabetes mellitus with other circulatory complications: Secondary | ICD-10-CM | POA: Diagnosis not present

## 2020-07-05 MED ORDER — APIXABAN 5 MG PO TABS: 5.0000 mg | ORAL_TABLET | Freq: Two times a day (BID) | ORAL | 0 refills | Status: AC

## 2020-07-05 NOTE — Telephone Encounter (Signed)
Returned the call to the patient and his girlfriend [name] [name].  They gotten the patients medications mixed up. The patient does not need a refill of Entresto. Patient needs Eliquis refilled. Rx is to be sent to [facility] [address]. He is not out of Eliquis, has several days supply on hand.

## 2020-07-05 NOTE — Telephone Encounter (Signed)
Prescription refill request for Eliquis received. Indication: a fib Last office visit: 04/23/20 Scr: 1.29 Age: 79 Weight: 250

## 2020-07-05 NOTE — Telephone Encounter (Signed)
He needs basic metabolic profile.  Okay to refill.

## 2020-07-06 LAB — CULTURE, BLOOD (ROUTINE X 2)
Culture: NO GROWTH
Culture: NO GROWTH
Special Requests: ADEQUATE
Special Requests: ADEQUATE

## 2020-07-08 LAB — AEROBIC/ANAEROBIC CULTURE W GRAM STAIN (SURGICAL/DEEP WOUND)

## 2020-07-09 ENCOUNTER — Telehealth: Payer: Self-pay | Admitting: Cardiovascular Disease

## 2020-07-09 NOTE — Telephone Encounter (Signed)
Patient entered office demanding to see a nurse to go over his medication. Patient states there was an issue with a prescription that was [facility] to [facility] and wants to clarify. After checking, there are no available nurses due to clinic. Patient was offered a phone call and a later appt. Patient stated " a phone call will not work" angrily and then stormed off before any further appointment and option was made

## 2020-07-09 NOTE — Telephone Encounter (Signed)
Attempted to call the pt but he answered and then hung up on me. I will close this encounter and hoping the pt will call back if he still needs assistance.

## 2020-07-11 DIAGNOSIS — T2124XD Burn of second degree of lower back, subsequent encounter: Secondary | ICD-10-CM | POA: Diagnosis not present

## 2020-07-11 DIAGNOSIS — E1159 Type 2 diabetes mellitus with other circulatory complications: Secondary | ICD-10-CM | POA: Diagnosis not present

## 2020-07-11 DIAGNOSIS — S91302S Unspecified open wound, left foot, sequela: Secondary | ICD-10-CM | POA: Diagnosis not present

## 2020-07-12 DIAGNOSIS — E1159 Type 2 diabetes mellitus with other circulatory complications: Secondary | ICD-10-CM | POA: Diagnosis not present

## 2020-07-12 DIAGNOSIS — L02612 Cutaneous abscess of left foot: Secondary | ICD-10-CM | POA: Diagnosis not present

## 2020-07-12 DIAGNOSIS — L97521 Non-pressure chronic ulcer of other part of left foot limited to breakdown of skin: Secondary | ICD-10-CM | POA: Diagnosis not present

## 2020-07-12 DIAGNOSIS — L03116 Cellulitis of left lower limb: Secondary | ICD-10-CM | POA: Diagnosis not present

## 2020-07-16 ENCOUNTER — Other Ambulatory Visit: Payer: Self-pay | Admitting: Cardiovascular Disease

## 2020-07-24 DIAGNOSIS — I4891 Unspecified atrial fibrillation: Secondary | ICD-10-CM | POA: Diagnosis not present

## 2020-07-24 DIAGNOSIS — I5022 Chronic systolic (congestive) heart failure: Secondary | ICD-10-CM | POA: Diagnosis not present

## 2020-07-24 DIAGNOSIS — I10 Essential (primary) hypertension: Secondary | ICD-10-CM | POA: Diagnosis not present

## 2020-07-24 DIAGNOSIS — E785 Hyperlipidemia, unspecified: Secondary | ICD-10-CM | POA: Diagnosis not present

## 2020-07-24 DIAGNOSIS — I255 Ischemic cardiomyopathy: Secondary | ICD-10-CM | POA: Diagnosis not present

## 2020-07-24 DIAGNOSIS — I493 Ventricular premature depolarization: Secondary | ICD-10-CM | POA: Diagnosis not present

## 2020-07-24 DIAGNOSIS — I2510 Atherosclerotic heart disease of native coronary artery without angina pectoris: Secondary | ICD-10-CM | POA: Diagnosis not present

## 2020-07-24 DIAGNOSIS — E1159 Type 2 diabetes mellitus with other circulatory complications: Secondary | ICD-10-CM | POA: Diagnosis not present

## 2020-07-24 DIAGNOSIS — I714 Abdominal aortic aneurysm, without rupture: Secondary | ICD-10-CM | POA: Diagnosis not present

## 2020-07-24 DIAGNOSIS — E669 Obesity, unspecified: Secondary | ICD-10-CM | POA: Diagnosis not present

## 2020-07-24 DIAGNOSIS — L97521 Non-pressure chronic ulcer of other part of left foot limited to breakdown of skin: Secondary | ICD-10-CM | POA: Diagnosis not present

## 2020-08-02 ENCOUNTER — Ambulatory Visit: Payer: PPO | Admitting: Cardiovascular Disease

## 2020-08-02 DIAGNOSIS — L03116 Cellulitis of left lower limb: Secondary | ICD-10-CM | POA: Diagnosis not present

## 2020-08-02 DIAGNOSIS — E1159 Type 2 diabetes mellitus with other circulatory complications: Secondary | ICD-10-CM | POA: Diagnosis not present

## 2020-08-02 DIAGNOSIS — L97521 Non-pressure chronic ulcer of other part of left foot limited to breakdown of skin: Secondary | ICD-10-CM | POA: Diagnosis not present

## 2020-08-06 ENCOUNTER — Other Ambulatory Visit
Admission: RE | Admit: 2020-08-06 | Discharge: 2020-08-06 | Disposition: A | Discharge status: Home / Self Care | Payer: PPO | Source: Ambulatory Visit | Attending: Cardiology | Admitting: Cardiology

## 2020-08-06 ENCOUNTER — Other Ambulatory Visit: Payer: Self-pay

## 2020-08-06 DIAGNOSIS — Z01812 Encounter for preprocedural laboratory examination: Secondary | ICD-10-CM | POA: Insufficient documentation

## 2020-08-06 DIAGNOSIS — Z20822 Contact with and (suspected) exposure to covid-19: Secondary | ICD-10-CM | POA: Insufficient documentation

## 2020-08-06 LAB — SARS CORONAVIRUS 2 (TAT 6-24 HRS): SARS Coronavirus 2: NEGATIVE

## 2020-08-08 DIAGNOSIS — D72829 Elevated white blood cell count, unspecified: Secondary | ICD-10-CM | POA: Diagnosis not present

## 2020-08-09 ENCOUNTER — Ambulatory Visit
Admission: RE | Admit: 2020-08-09 | Discharge: 2020-08-09 | Disposition: A | Discharge status: Home / Self Care | Payer: PPO | Attending: Cardiology | Admitting: Cardiology

## 2020-08-09 ENCOUNTER — Ambulatory Visit: Payer: PPO | Admitting: Certified Registered"

## 2020-08-09 ENCOUNTER — Encounter
Admission: RE | Disposition: A | Discharge status: Home / Self Care | Payer: Self-pay | Source: Home / Self Care | Attending: Cardiology

## 2020-08-09 ENCOUNTER — Other Ambulatory Visit: Payer: Self-pay

## 2020-08-09 ENCOUNTER — Encounter: Payer: Self-pay | Admitting: Cardiology

## 2020-08-09 ENCOUNTER — Other Ambulatory Visit: Payer: Self-pay | Admitting: Cardiology

## 2020-08-09 DIAGNOSIS — I110 Hypertensive heart disease with heart failure: Secondary | ICD-10-CM | POA: Insufficient documentation

## 2020-08-09 DIAGNOSIS — Z7982 Long term (current) use of aspirin: Secondary | ICD-10-CM | POA: Insufficient documentation

## 2020-08-09 DIAGNOSIS — E1142 Type 2 diabetes mellitus with diabetic polyneuropathy: Secondary | ICD-10-CM | POA: Diagnosis not present

## 2020-08-09 DIAGNOSIS — I714 Abdominal aortic aneurysm, without rupture: Secondary | ICD-10-CM | POA: Diagnosis not present

## 2020-08-09 DIAGNOSIS — Z888 Allergy status to other drugs, medicaments and biological substances status: Secondary | ICD-10-CM | POA: Diagnosis not present

## 2020-08-09 DIAGNOSIS — Z951 Presence of aortocoronary bypass graft: Secondary | ICD-10-CM | POA: Insufficient documentation

## 2020-08-09 DIAGNOSIS — E785 Hyperlipidemia, unspecified: Secondary | ICD-10-CM | POA: Diagnosis not present

## 2020-08-09 DIAGNOSIS — I340 Nonrheumatic mitral (valve) insufficiency: Secondary | ICD-10-CM | POA: Insufficient documentation

## 2020-08-09 DIAGNOSIS — Z7901 Long term (current) use of anticoagulants: Secondary | ICD-10-CM | POA: Diagnosis not present

## 2020-08-09 DIAGNOSIS — Z6834 Body mass index (BMI) 34.0-34.9, adult: Secondary | ICD-10-CM | POA: Diagnosis not present

## 2020-08-09 DIAGNOSIS — Z800 Family history of malignant neoplasm of digestive organs: Secondary | ICD-10-CM | POA: Insufficient documentation

## 2020-08-09 DIAGNOSIS — I5022 Chronic systolic (congestive) heart failure: Secondary | ICD-10-CM | POA: Insufficient documentation

## 2020-08-09 DIAGNOSIS — Z820 Family history of epilepsy and other diseases of the nervous system: Secondary | ICD-10-CM | POA: Diagnosis not present

## 2020-08-09 DIAGNOSIS — Z79899 Other long term (current) drug therapy: Secondary | ICD-10-CM | POA: Insufficient documentation

## 2020-08-09 DIAGNOSIS — Z87891 Personal history of nicotine dependence: Secondary | ICD-10-CM | POA: Diagnosis not present

## 2020-08-09 DIAGNOSIS — I480 Paroxysmal atrial fibrillation: Secondary | ICD-10-CM | POA: Diagnosis not present

## 2020-08-09 DIAGNOSIS — I25119 Atherosclerotic heart disease of native coronary artery with unspecified angina pectoris: Secondary | ICD-10-CM | POA: Diagnosis not present

## 2020-08-09 DIAGNOSIS — I255 Ischemic cardiomyopathy: Secondary | ICD-10-CM | POA: Insufficient documentation

## 2020-08-09 DIAGNOSIS — E119 Type 2 diabetes mellitus without complications: Secondary | ICD-10-CM | POA: Insufficient documentation

## 2020-08-09 DIAGNOSIS — I4891 Unspecified atrial fibrillation: Secondary | ICD-10-CM | POA: Diagnosis not present

## 2020-08-09 DIAGNOSIS — Z823 Family history of stroke: Secondary | ICD-10-CM | POA: Diagnosis not present

## 2020-08-09 HISTORY — PX: CARDIOVERSION: SHX1299

## 2020-08-09 SURGERY — CARDIOVERSION
Anesthesia: General

## 2020-08-09 MED ORDER — ONDANSETRON HCL 4 MG/2ML IJ SOLN: 4.0000 mg | Freq: Once | INTRAMUSCULAR | Status: DC | PRN

## 2020-08-09 MED ORDER — SODIUM CHLORIDE 0.9 % IV SOLN: INTRAVENOUS | Status: DC | PRN

## 2020-08-09 MED ORDER — HYDROCORTISONE 1 % EX CREA
1.0000 "application " | TOPICAL_CREAM | Freq: Three times a day (TID) | CUTANEOUS | Status: DC | PRN
  Filled 2020-08-09: qty 28

## 2020-08-09 MED ORDER — PROPOFOL 10 MG/ML IV BOLUS
INTRAVENOUS | Status: DC | PRN
  Administered 2020-08-09: 60 mg via INTRAVENOUS
  Administered 2020-08-09 (×2): 10 mg via INTRAVENOUS

## 2020-08-09 MED ORDER — FENTANYL CITRATE (PF) 100 MCG/2ML IJ SOLN: 25.0000 ug | INTRAMUSCULAR | Status: DC | PRN

## 2020-08-09 NOTE — Progress Notes (Signed)
Patient awake/alert x4. No c/o's  Pre EKG completed afib. NPO since midnight. Am meds with sips.

## 2020-08-09 NOTE — Progress Notes (Signed)
Patient tolerated DCCV without event, converted to SR. Tolerated gingerale without event, f/u appt made, patient verbalizes understanding.

## 2020-08-09 NOTE — Anesthesia Preprocedure Evaluation (Signed)
Anesthesia Evaluation  Patient identified by MRN, date of birth, ID band Patient awake    Reviewed: Allergy & Precautions, H&P , NPO status , Patient's Chart, lab work & pertinent test results, reviewed documented beta blocker date and time   Airway Mallampati: II   Neck ROM: full    Dental  (+) Poor Dentition   Pulmonary neg pulmonary ROS, former smoker,    Pulmonary exam normal        Cardiovascular Exercise Tolerance: Poor hypertension, On Medications + angina with exertion + CAD, +CHF and + DOE  (-) Orthopnea and (-) PND Normal cardiovascular exam Rhythm:regular Rate:Normal     Neuro/Psych  Neuromuscular disease negative psych ROS   GI/Hepatic Neg liver ROS, GERD  Medicated,  Endo/Other  diabetes, Well Controlled, Type obesity  Renal/GU Renal disease  negative genitourinary   Musculoskeletal   Abdominal   Peds  Hematology negative hematology ROS (+)   Anesthesia Other Findings Past Medical History: No date: AAA (abdominal aortic aneurysm) (HCC) 09/20/2015: ACE inhibitor associated hyperkalemia No date: Arthritis No date: CAD (coronary artery disease)     Comment:  a. 03/2014 NSTEMI--> 05/2014 CABG x 5 (LIMA->D1, VG->LAD,              VG->OM1, VG->OM2, VG->RPDA);  c. 04/2015 Cath: LM nl, LAD               100ost, 40d, RI 80, LCX 80p/m, OM1 70, OM2 80, RCA 20p,               50m, 90d, VG->dLAD min irregs, VG->OM1 100, VG->OM2 100,               VG->RPDA nl, LIMA->D2 nl. No date: Chronic systolic CHF (congestive heart failure) (HCC)     Comment:  a. EF 25-30% by 2D ECHO 04/20/14. Severely dec global               hypokinesis. mildly elevated RVSP;  b. 12/2014 Echo: EF               40-45%, mod dil LA, mildly dil RV/RA;  c. 04/2015 Echo: EF              30-35%, sev antsept HK, Gr1 DD, mildly dil RA/LA;  d.               07/2015 Echo: EF 35-40%, ant/antsept HK, Gr1 DD, mildly               red RV fxn. No  date: Diabetes mellitus without complication (HCC) No date: Epigastric hernia No date: Frequent PVCs     Comment:  a. 24 hr Holter 06/2015: >28K PVCs accounting for 25% of              all beats, rare PAC-->mexilitene started;  b. 07/2015               Holter: 1700 PVC's/24 hrs. No date: GERD (gastroesophageal reflux disease) No date: Gout No date: Hyperlipidemia No date: Hypertension No date: Ischemic cardiomyopathy     Comment:  a. EF 25-30% 03/2014;  b. EF 40-45% 12/2014. No date: Mitral regurgitation     Comment:  a. 03/2014 TEE: mild to mod MR. No date: Nephrolithiasis No date: Neuromuscular disorder (HCC)     Comment:  DIABETIC NEUROPATHY No date: Obesity No date: Umbilical hernia Past Surgical History: No date: CARDIAC CATHETERIZATION     Comment:  [facility] 05/23/2015: CARDIAC CATHETERIZATION; N/A     Comment:  Procedure: Left Heart Cath and Coronary Angiography;                Surgeon: Iran Ouch, MD;  Location: [facility] INVASIVE CV               LAB;  Service: Cardiovascular;  Laterality: N/A; No date: CARPAL TUNNEL RELEASE; Right 06/06/2014: CORONARY ARTERY BYPASS GRAFT; N/A     Comment:  Procedure: CORONARY ARTERY BYPASS GRAFTING (CABG) x 5               USING LEFT AND RIGHT SAPHENOUS LEG VEIN HARVESTED               ENDOSCOPICALLY;  Surgeon: Kerin Perna, MD;  Location:              [facility] OR;  Service: Open Heart Surgery;  Laterality: N/A; 02/11/2016: ESOPHAGOGASTRODUODENOSCOPY (EGD) WITH PROPOFOL; N/A     Comment:  Procedure: ESOPHAGOGASTRODUODENOSCOPY (EGD) WITH               PROPOFOL with dialation;  Surgeon: [name], MD;                Location: [facility];  Service: Endoscopy;                Laterality: N/A;  diabetic, oral meds 06/06/2014: INTRAOPERATIVE TRANSESOPHAGEAL ECHOCARDIOGRAM; N/A     Comment:  Procedure: INTRAOPERATIVE TRANSESOPHAGEAL               ECHOCARDIOGRAM;  Surgeon: Kerin Perna, MD;  Location:              [facility] OR;  Service: Open Heart  Surgery;  Laterality: N/A; [id number]: ROTATOR CUFF REPAIR; Right 04/24/2014: TEE WITHOUT CARDIOVERSION; N/A     Comment:  Procedure: TRANSESOPHAGEAL ECHOCARDIOGRAM (TEE);                Surgeon: [name], MD;  Location: [facility] ENDOSCOPY;                Service: Cardiovascular;  Laterality: N/A;   Reproductive/Obstetrics negative OB ROS                             Anesthesia Physical  Anesthesia Plan  ASA: IV  Anesthesia Plan: General   Post-op Pain Management:    Induction:   PONV Risk Score and Plan: Propofol infusion  Airway Management Planned: Nasal Cannula  Additional Equipment:   Intra-op Plan:   Post-operative Plan:   Informed Consent: I have reviewed the patients History and Physical, chart, labs and discussed the procedure including the risks, benefits and alternatives for the proposed anesthesia with the patient or authorized representative who has indicated his/her understanding and acceptance.     Dental Advisory Given  Plan Discussed with: CRNA  Anesthesia Plan Comments:         Anesthesia Quick Evaluation

## 2020-08-09 NOTE — Transfer of Care (Signed)
Immediate Anesthesia Transfer of Care Note  Patient: [name] [name]  Procedure(s) Performed: CARDIOVERSION (N/A )  Patient Location: Cath Lab  Anesthesia Type:General  Level of Consciousness: awake, drowsy and patient cooperative  Airway & Oxygen Therapy: Patient Spontanous Breathing and Patient connected to nasal cannula oxygen  Post-op Assessment: Report given to RN and Post -op Vital signs reviewed and stable  Post vital signs: Reviewed and stable  Last Vitals:  Vitals Value Taken Time  BP 114/73 08/09/20 1025  Temp    Pulse 78 08/09/20 1028  Resp 16 08/09/20 1028  SpO2 100 % 08/09/20 1028    Last Pain:  Vitals:   08/09/20 1008  TempSrc: Oral  PainSc: 0-No pain         Complications: No complications documented.

## 2020-08-09 NOTE — Procedures (Signed)
Procedure; Elective cardioversion  Indication: symptomatic afib  After informed consent, time out protocol and adequate sedation per department of anesthesia with 80 mg of iv propofol, pt received a 120 J of synchronized, biphasic shock to nsr. NO immediate complications.

## 2020-08-09 NOTE — Anesthesia Postprocedure Evaluation (Signed)
Anesthesia Post Note  Patient: [name] [name]  Procedure(s) Performed: CARDIOVERSION (N/A )  Patient location during evaluation: Specials Recovery Anesthesia Type: General Level of consciousness: awake and alert and oriented Pain management: pain level controlled Vital Signs Assessment: post-procedure vital signs reviewed and stable Respiratory status: spontaneous breathing Cardiovascular status: blood pressure returned to baseline Anesthetic complications: no   No complications documented.   Last Vitals:  Vitals:   08/09/20 1100 08/09/20 1115  BP:  112/60  Pulse:  79  Resp: 16 13  Temp:    SpO2:  97%    Last Pain:  Vitals:   08/09/20 1115  TempSrc:   PainSc: 0-No pain                 Rochele Lueck

## 2020-08-09 NOTE — Anesthesia Procedure Notes (Signed)
Procedure Name: General with mask airway Performed by: Fletcher-Harrison, Trinka Keshishyan, CRNA Pre-anesthesia Checklist: Patient identified, Emergency Drugs available, Suction available and Patient being monitored Patient Re-evaluated:Patient Re-evaluated prior to induction Oxygen Delivery Method: Simple face mask Induction Type: IV induction Placement Confirmation: positive ETCO2 and CO2 detector Dental Injury: Teeth and Oropharynx as per pre-operative assessment        

## 2020-08-09 NOTE — H&P (Signed)
Chief Complaint: Chief Complaint  Patient presents with   Follow-up  32mo Date of Service: 07/24/2020 Date of Birth: 104-24-1942PCP: HLovie Macadamia MD  History of Present Illness: Mr. [name] [name] a 80y.o.male patient with a past medical history significant for longstanding persistent atrial fibrillation s/p DCCV on 01/30/20, anticoagulated with Eliquis, coronary artery disease s/p CABG x 5 in 2015, frequent PVCs, on mexiletine, history of HFrEF, a small AAA, hypertension, and hyperlipidemia who presents for a follow up visit. [facility]   He presents today and reports a few week history of exertional dyspnea with occasional palpitations. He denies chest pain or chest pressure. He is uncertain about which medications he is currently taking, but believes he has been off Entresto due to cost and back on valsartan. Due to abdominal discomfort, he was previously taking Eliquis once a day, but has since resumed taking it twice a day. He denies any evidence of hematuria, hematochezia, or melena. He denies lower extremity swelling, orthopnea, or PND. He denies syncopal or presyncopal episodes.   I personally reviewed the ECG performed in the office today which revealed atrial fibrillation at a rapid ventricular response of 116bpm. [facility] function and moderately decreased LV systolic function with an EF estimated between 35-40%. Abdominal [place] revealed a small AAA, measured at 3cm in maximal diameter.   Past Medical and Surgical History  Past Medical History Past Medical History:  Diagnosis Date   Abdominal aortic aneurysm (CMS-HCC) 02/2007  2.7 x 3.3 cm, last check unchanged   Arthritis   CAD (coronary artery disease) 2015  CABG x 5   Diabetes (CMS-HCC)   Elevated PSA   Gout   Hypertension   Kidney stones   Renal cyst   Thoracic  spine fracture (CMS-HCC) 1996  VERTEBRAL FRACTURES X 2 FROM AN INJURY   Ventral hernia   Past Surgical History He has a past surgical history that includes HAND AND SHOULDER SURGERY (1996, 1997, and 2000 ); egd; and Coronary artery bypass graft (05/2014).   Medications and Allergies  Current Medications  Current Outpatient Medications  Medication Sig Dispense Refill   acetaminophen (TYLENOL) 500 MG tablet Take by mouth   apixaban (ELIQUIS) 5 mg tablet Take 1 tablet (5 mg total) by mouth every 12 (twelve) hours 60 tablet 11   aspirin 81 MG EC tablet Take 81 mg by mouth once daily.   atorvastatin (LIPITOR) 40 MG tablet Take 1 tablet (40 mg total) by mouth once daily 30 tablet 11   blood glucose diagnostic (GLUCOSE BLOOD) test strip Check your blood sugar fasting daily and prior to dinner   blood glucose meter (RELION CONFIRM) kit Check your blood sugar fasting daily and prior to dinner   clindamycin (CLEOCIN) 300 MG capsule Take 300 mg by mouth 3 (three) times daily   clotrimazole-betamethasone (LOTRISONE) 1-0.05 % cream Apply topically 2 (two) times daily 45 g 0   colchicine (COLCRYS) 0.6 mg tablet Take 1 tablet by mouth twice daily 180 tablet 3   FUROsemide (LASIX) 40 MG tablet Take 1 tablet (40 mg total) by mouth once daily 30 tablet 11   gabapentin (NEURONTIN) 300 MG capsule Take 1 capsule by mouth 4 times daily 360 capsule 3   mexiletine (MEXITIL) 200 MG capsule Take 200 mg by mouth 4 (four) times daily.    nitroGLYcerin (NITROSTAT) 0.4  MG SL tablet Place 0.4 mg under the tongue every 5 (five) minutes as needed for Chest pain. May take up to 3 doses.   povidone-iodine (BETADINE) 10 % external solution Apply topically as needed for Wound Care   silver sulfADIAZINE (SSD) 1 % cream Apply topically 2 (two) times daily 50 g 0   valsartan (DIOVAN) 40 MG tablet Take 1 tablet (40 mg total) by mouth once daily 30 tablet 11   No current facility-administered medications for  this visit.   Allergies: Glimepiride  Social and Family History  Social History reports that he has quit smoking. His smokeless tobacco use includes chew. He reports current alcohol use. He reports that he does not use drugs.  Family History Family History  Problem Relation Age of Onset   Alzheimer's disease Mother  in her mid 25s   Stroke Father   Throat cancer Father   Review of Systems   Review of Systems: The patient denies chest pain, shortness of breath, orthopnea, paroxysmal nocturnal dyspnea, pedal edema, palpitations, heart racing, fatigue, dizziness, lightheadedness, presyncope, syncope, leg pain, leg cramping. Review of 12 Systems is negative except as described in HPI.   Physical Examination   Vitals:BP 132/84   Pulse 105   Ht 175.3 cm (5\' 9" )   Wt (!) 117.6 kg (259 lb 3.2 oz)   SpO2 92%   BMI 38.28 kg/m  Ht:175.3 cm (5\' 9" ) Wt:(!) 117.6 kg (259 lb 3.2 oz) ZOX:WRUE surface area is 2.39 meters squared. Body mass index is 38.28 kg/m.  General: Well developed, well nourished. In no acute distress HEENT: Pupils equally reactive to light and accomodation  Neck: Supple without thyromegaly, or goiter. Carotid pulses 2+. No carotid bruits present.  Pulmonary: Clear to auscultation bilaterally; no wheezes, rales, rhonchi Cardiovascular: Regular rate and rhythm. No gallops, murmurs or rubs Gastrointestinal: Soft nontender, nondistended, with normal bowel sounds Extremities: No cyanosis, clubbing, or edema Peripheral Pulses: 2+ in upper extremities, 2+ in lower extremities  Neurology: Alert and oriented X3 Pysch: Good affect. Responds appropriately  Assessment and Plan   79 y.o. male with  1. Atrial fibrillation, unspecified type (CMS-HCC)  -S/p DCCV on 01/30/20 -Currently in atrial fibrillation with RVR -Discussed alternative medications for rate/rhythm control as well as options for repeat DCCV or referral to EP for consideration of ablation; patient prefers repeat  DCCV at this time -Stressed the importance of ongoing compliance of Eliquis 5mg  BID  -Will continue carvedilol 12.5mg  BID  2. Frequent PVCs  -Symptoms are well controlled on mexiletine; will continue with this and follow  3. Coronary artery disease involving native coronary artery of native heart without angina pectoris  -S/p CABG -Continue aspirin 81mg  daily, Eliquis 5mg  BID, carvedilol 12.5mg  BID, atorvastatin 40mg  daily -Will resume valsartan 40mg  daily  4. Type 2 diabetes mellitus with vascular disease (CMS-HCC)  -Continue routine f/u with PCP  5. Essential hypertension  -Continue carvedilol 12.5mg  BID, valsartan 40mg  daily, and Lasix 40mg  daily  6. Abdominal aortic aneurysm (AAA) without rupture (CMS-HCC)  -3cm per [place] in March 2021; repeat US recommended for March 2024  7. Chronic systolic CHF (congestive heart failure) (CMS-HCC)  -Appears euvolemic on exam today  -Unable to afford Entresto, defers alternative medications  -Will continue carvedilol 12.5mg  BID, valsartan 40mg  daily, and Lasix 40mg  daily  8. Cardiomyopathy, ischemic  -See above  9. Obesity (BMI 30-39.9), unspecified  -Weight loss encouraged through dietary modifications and increased physical activity  10. Hyperlipidemia, unspecified hyperlipidemia type  -Continue atorvastatin 40mg   daily with an LDL goal less than 70; most recent calculated at 30     Orders Placed This Encounter  Procedures   Complete Blood Count (CBC)   Basic Metabolic Panel (BMP)   ECG 12-lead   Return after cardioversion.  Teodoro Spray MD  Pt seen and examined. No change from above.

## 2020-08-13 DIAGNOSIS — I4891 Unspecified atrial fibrillation: Secondary | ICD-10-CM | POA: Diagnosis not present

## 2020-08-13 DIAGNOSIS — M109 Gout, unspecified: Secondary | ICD-10-CM | POA: Diagnosis not present

## 2020-08-13 DIAGNOSIS — I10 Essential (primary) hypertension: Secondary | ICD-10-CM | POA: Diagnosis not present

## 2020-08-13 DIAGNOSIS — Z125 Encounter for screening for malignant neoplasm of prostate: Secondary | ICD-10-CM | POA: Diagnosis not present

## 2020-08-13 DIAGNOSIS — Z0000 Encounter for general adult medical examination without abnormal findings: Secondary | ICD-10-CM | POA: Diagnosis not present

## 2020-08-13 DIAGNOSIS — E1159 Type 2 diabetes mellitus with other circulatory complications: Secondary | ICD-10-CM | POA: Diagnosis not present

## 2020-08-13 DIAGNOSIS — E785 Hyperlipidemia, unspecified: Secondary | ICD-10-CM | POA: Diagnosis not present

## 2020-08-13 DIAGNOSIS — E669 Obesity, unspecified: Secondary | ICD-10-CM | POA: Diagnosis not present

## 2020-08-13 DIAGNOSIS — Z23 Encounter for immunization: Secondary | ICD-10-CM | POA: Diagnosis not present

## 2020-08-13 DIAGNOSIS — I2510 Atherosclerotic heart disease of native coronary artery without angina pectoris: Secondary | ICD-10-CM | POA: Diagnosis not present

## 2020-08-20 DIAGNOSIS — I5022 Chronic systolic (congestive) heart failure: Secondary | ICD-10-CM | POA: Diagnosis not present

## 2020-08-20 DIAGNOSIS — E785 Hyperlipidemia, unspecified: Secondary | ICD-10-CM | POA: Diagnosis not present

## 2020-08-20 DIAGNOSIS — I714 Abdominal aortic aneurysm, without rupture: Secondary | ICD-10-CM | POA: Diagnosis not present

## 2020-08-20 DIAGNOSIS — I4891 Unspecified atrial fibrillation: Secondary | ICD-10-CM | POA: Diagnosis not present

## 2020-08-20 DIAGNOSIS — I2510 Atherosclerotic heart disease of native coronary artery without angina pectoris: Secondary | ICD-10-CM | POA: Diagnosis not present

## 2020-08-20 DIAGNOSIS — I493 Ventricular premature depolarization: Secondary | ICD-10-CM | POA: Diagnosis not present

## 2020-08-20 DIAGNOSIS — I10 Essential (primary) hypertension: Secondary | ICD-10-CM | POA: Diagnosis not present

## 2020-08-20 DIAGNOSIS — I255 Ischemic cardiomyopathy: Secondary | ICD-10-CM | POA: Diagnosis not present

## 2020-09-04 DIAGNOSIS — H524 Presbyopia: Secondary | ICD-10-CM | POA: Diagnosis not present

## 2020-09-04 DIAGNOSIS — H52223 Regular astigmatism, bilateral: Secondary | ICD-10-CM | POA: Diagnosis not present

## 2020-09-04 DIAGNOSIS — H5203 Hypermetropia, bilateral: Secondary | ICD-10-CM | POA: Diagnosis not present

## 2020-09-04 DIAGNOSIS — E119 Type 2 diabetes mellitus without complications: Secondary | ICD-10-CM | POA: Diagnosis not present

## 2020-09-04 DIAGNOSIS — Z7984 Long term (current) use of oral hypoglycemic drugs: Secondary | ICD-10-CM | POA: Diagnosis not present

## 2020-09-04 DIAGNOSIS — H2513 Age-related nuclear cataract, bilateral: Secondary | ICD-10-CM | POA: Diagnosis not present

## 2020-10-03 DIAGNOSIS — E1159 Type 2 diabetes mellitus with other circulatory complications: Secondary | ICD-10-CM | POA: Diagnosis not present

## 2020-10-03 DIAGNOSIS — L97521 Non-pressure chronic ulcer of other part of left foot limited to breakdown of skin: Secondary | ICD-10-CM | POA: Diagnosis not present

## 2020-10-03 DIAGNOSIS — B351 Tinea unguium: Secondary | ICD-10-CM | POA: Diagnosis not present

## 2020-10-03 DIAGNOSIS — L600 Ingrowing nail: Secondary | ICD-10-CM | POA: Diagnosis not present

## 2020-10-03 DIAGNOSIS — L851 Acquired keratosis [keratoderma] palmaris et plantaris: Secondary | ICD-10-CM | POA: Diagnosis not present

## 2020-10-09 ENCOUNTER — Other Ambulatory Visit: Payer: Self-pay | Admitting: Cardiovascular Disease

## 2020-10-18 ENCOUNTER — Telehealth: Payer: Self-pay

## 2020-10-18 NOTE — Telephone Encounter (Addendum)
Tried to contact the patient regarding Eliquis. I was unable to leave a message on his voice mail. The patient was not approved for patient assistance for Eliquis 5 mg one tablet twice a day.

## 2020-10-23 NOTE — Telephone Encounter (Signed)
Spoke with Mr. [name] [name] regarding his care, I see he has switched to Dr. Lady Gary from Dr. Kirke Corin, instructed he would need to contact Dr. America Brown office for patient assistance for the Eliquis.

## 2020-11-19 DIAGNOSIS — Z789 Other specified health status: Secondary | ICD-10-CM | POA: Diagnosis not present

## 2020-11-19 DIAGNOSIS — I2510 Atherosclerotic heart disease of native coronary artery without angina pectoris: Secondary | ICD-10-CM | POA: Diagnosis not present

## 2020-11-19 DIAGNOSIS — I4891 Unspecified atrial fibrillation: Secondary | ICD-10-CM | POA: Diagnosis not present

## 2020-11-19 DIAGNOSIS — I255 Ischemic cardiomyopathy: Secondary | ICD-10-CM | POA: Diagnosis not present

## 2020-11-19 DIAGNOSIS — I714 Abdominal aortic aneurysm, without rupture: Secondary | ICD-10-CM | POA: Diagnosis not present

## 2020-11-19 DIAGNOSIS — I10 Essential (primary) hypertension: Secondary | ICD-10-CM | POA: Diagnosis not present

## 2020-11-19 DIAGNOSIS — I5022 Chronic systolic (congestive) heart failure: Secondary | ICD-10-CM | POA: Diagnosis not present

## 2020-11-19 DIAGNOSIS — I493 Ventricular premature depolarization: Secondary | ICD-10-CM | POA: Diagnosis not present

## 2020-12-31 DIAGNOSIS — L851 Acquired keratosis [keratoderma] palmaris et plantaris: Secondary | ICD-10-CM | POA: Diagnosis not present

## 2020-12-31 DIAGNOSIS — E1159 Type 2 diabetes mellitus with other circulatory complications: Secondary | ICD-10-CM | POA: Diagnosis not present

## 2020-12-31 DIAGNOSIS — B351 Tinea unguium: Secondary | ICD-10-CM | POA: Diagnosis not present

## 2021-01-08 ENCOUNTER — Other Ambulatory Visit: Payer: Self-pay | Admitting: Cardiovascular Disease

## 2021-02-05 DIAGNOSIS — Z125 Encounter for screening for malignant neoplasm of prostate: Secondary | ICD-10-CM | POA: Diagnosis not present

## 2021-02-05 DIAGNOSIS — M109 Gout, unspecified: Secondary | ICD-10-CM | POA: Diagnosis not present

## 2021-02-05 DIAGNOSIS — I10 Essential (primary) hypertension: Secondary | ICD-10-CM | POA: Diagnosis not present

## 2021-02-05 DIAGNOSIS — E1159 Type 2 diabetes mellitus with other circulatory complications: Secondary | ICD-10-CM | POA: Diagnosis not present

## 2021-02-05 DIAGNOSIS — E785 Hyperlipidemia, unspecified: Secondary | ICD-10-CM | POA: Diagnosis not present

## 2021-02-12 DIAGNOSIS — I25118 Atherosclerotic heart disease of native coronary artery with other forms of angina pectoris: Secondary | ICD-10-CM | POA: Diagnosis not present

## 2021-02-12 DIAGNOSIS — R972 Elevated prostate specific antigen [PSA]: Secondary | ICD-10-CM | POA: Diagnosis not present

## 2021-02-12 DIAGNOSIS — M1A9XX0 Chronic gout, unspecified, without tophus (tophi): Secondary | ICD-10-CM | POA: Diagnosis not present

## 2021-02-12 DIAGNOSIS — N1831 Chronic kidney disease, stage 3a: Secondary | ICD-10-CM | POA: Diagnosis not present

## 2021-02-12 DIAGNOSIS — I4891 Unspecified atrial fibrillation: Secondary | ICD-10-CM | POA: Diagnosis not present

## 2021-02-12 DIAGNOSIS — E1159 Type 2 diabetes mellitus with other circulatory complications: Secondary | ICD-10-CM | POA: Diagnosis not present

## 2021-02-12 DIAGNOSIS — M19042 Primary osteoarthritis, left hand: Secondary | ICD-10-CM | POA: Diagnosis not present

## 2021-02-12 DIAGNOSIS — E785 Hyperlipidemia, unspecified: Secondary | ICD-10-CM | POA: Diagnosis not present

## 2021-02-12 DIAGNOSIS — M19041 Primary osteoarthritis, right hand: Secondary | ICD-10-CM | POA: Diagnosis not present

## 2021-02-12 DIAGNOSIS — I10 Essential (primary) hypertension: Secondary | ICD-10-CM | POA: Diagnosis not present

## 2021-04-24 DIAGNOSIS — E1159 Type 2 diabetes mellitus with other circulatory complications: Secondary | ICD-10-CM | POA: Diagnosis not present

## 2021-04-24 DIAGNOSIS — L851 Acquired keratosis [keratoderma] palmaris et plantaris: Secondary | ICD-10-CM | POA: Diagnosis not present

## 2021-04-24 DIAGNOSIS — B351 Tinea unguium: Secondary | ICD-10-CM | POA: Diagnosis not present

## 2021-05-07 DIAGNOSIS — E1159 Type 2 diabetes mellitus with other circulatory complications: Secondary | ICD-10-CM | POA: Diagnosis not present

## 2021-05-07 DIAGNOSIS — Z789 Other specified health status: Secondary | ICD-10-CM | POA: Diagnosis not present

## 2021-05-07 DIAGNOSIS — E669 Obesity, unspecified: Secondary | ICD-10-CM | POA: Diagnosis not present

## 2021-05-07 DIAGNOSIS — I5022 Chronic systolic (congestive) heart failure: Secondary | ICD-10-CM | POA: Diagnosis not present

## 2021-05-07 DIAGNOSIS — I493 Ventricular premature depolarization: Secondary | ICD-10-CM | POA: Diagnosis not present

## 2021-05-07 DIAGNOSIS — I2510 Atherosclerotic heart disease of native coronary artery without angina pectoris: Secondary | ICD-10-CM | POA: Diagnosis not present

## 2021-05-07 DIAGNOSIS — I10 Essential (primary) hypertension: Secondary | ICD-10-CM | POA: Diagnosis not present

## 2021-05-07 DIAGNOSIS — E785 Hyperlipidemia, unspecified: Secondary | ICD-10-CM | POA: Diagnosis not present

## 2021-05-07 DIAGNOSIS — I255 Ischemic cardiomyopathy: Secondary | ICD-10-CM | POA: Diagnosis not present

## 2021-05-07 DIAGNOSIS — I4891 Unspecified atrial fibrillation: Secondary | ICD-10-CM | POA: Diagnosis not present

## 2021-05-20 DIAGNOSIS — I4891 Unspecified atrial fibrillation: Secondary | ICD-10-CM | POA: Diagnosis not present

## 2021-05-20 DIAGNOSIS — I2510 Atherosclerotic heart disease of native coronary artery without angina pectoris: Secondary | ICD-10-CM | POA: Diagnosis not present

## 2021-05-20 DIAGNOSIS — E1159 Type 2 diabetes mellitus with other circulatory complications: Secondary | ICD-10-CM | POA: Diagnosis not present

## 2021-05-20 DIAGNOSIS — I5022 Chronic systolic (congestive) heart failure: Secondary | ICD-10-CM | POA: Diagnosis not present

## 2021-05-20 DIAGNOSIS — I10 Essential (primary) hypertension: Secondary | ICD-10-CM | POA: Diagnosis not present

## 2021-05-20 DIAGNOSIS — R202 Paresthesia of skin: Secondary | ICD-10-CM | POA: Diagnosis not present

## 2021-05-20 DIAGNOSIS — E785 Hyperlipidemia, unspecified: Secondary | ICD-10-CM | POA: Diagnosis not present

## 2021-05-28 DIAGNOSIS — E538 Deficiency of other specified B group vitamins: Secondary | ICD-10-CM | POA: Diagnosis not present

## 2021-06-04 DIAGNOSIS — E538 Deficiency of other specified B group vitamins: Secondary | ICD-10-CM | POA: Diagnosis not present

## 2021-06-11 DIAGNOSIS — E538 Deficiency of other specified B group vitamins: Secondary | ICD-10-CM | POA: Diagnosis not present

## 2021-06-18 DIAGNOSIS — E538 Deficiency of other specified B group vitamins: Secondary | ICD-10-CM | POA: Diagnosis not present

## 2021-07-24 DIAGNOSIS — L851 Acquired keratosis [keratoderma] palmaris et plantaris: Secondary | ICD-10-CM | POA: Diagnosis not present

## 2021-07-24 DIAGNOSIS — E1159 Type 2 diabetes mellitus with other circulatory complications: Secondary | ICD-10-CM | POA: Diagnosis not present

## 2021-07-24 DIAGNOSIS — B351 Tinea unguium: Secondary | ICD-10-CM | POA: Diagnosis not present

## 2021-08-07 DIAGNOSIS — E1159 Type 2 diabetes mellitus with other circulatory complications: Secondary | ICD-10-CM | POA: Diagnosis not present

## 2021-08-14 DIAGNOSIS — M19049 Primary osteoarthritis, unspecified hand: Secondary | ICD-10-CM | POA: Diagnosis not present

## 2021-08-14 DIAGNOSIS — Z0000 Encounter for general adult medical examination without abnormal findings: Secondary | ICD-10-CM | POA: Diagnosis not present

## 2021-08-14 DIAGNOSIS — E669 Obesity, unspecified: Secondary | ICD-10-CM | POA: Diagnosis not present

## 2021-08-14 DIAGNOSIS — R972 Elevated prostate specific antigen [PSA]: Secondary | ICD-10-CM | POA: Diagnosis not present

## 2021-08-14 DIAGNOSIS — I4891 Unspecified atrial fibrillation: Secondary | ICD-10-CM | POA: Diagnosis not present

## 2021-08-14 DIAGNOSIS — I2510 Atherosclerotic heart disease of native coronary artery without angina pectoris: Secondary | ICD-10-CM | POA: Diagnosis not present

## 2021-08-14 DIAGNOSIS — I10 Essential (primary) hypertension: Secondary | ICD-10-CM | POA: Diagnosis not present

## 2021-08-14 DIAGNOSIS — E1159 Type 2 diabetes mellitus with other circulatory complications: Secondary | ICD-10-CM | POA: Diagnosis not present

## 2021-08-14 DIAGNOSIS — N200 Calculus of kidney: Secondary | ICD-10-CM | POA: Diagnosis not present

## 2021-09-28 DIAGNOSIS — E11621 Type 2 diabetes mellitus with foot ulcer: Secondary | ICD-10-CM | POA: Diagnosis not present

## 2021-09-28 DIAGNOSIS — L97521 Non-pressure chronic ulcer of other part of left foot limited to breakdown of skin: Secondary | ICD-10-CM | POA: Diagnosis not present

## 2021-10-01 DIAGNOSIS — L97521 Non-pressure chronic ulcer of other part of left foot limited to breakdown of skin: Secondary | ICD-10-CM | POA: Diagnosis not present

## 2021-10-01 DIAGNOSIS — E1159 Type 2 diabetes mellitus with other circulatory complications: Secondary | ICD-10-CM | POA: Diagnosis not present

## 2021-10-28 DIAGNOSIS — L03032 Cellulitis of left toe: Secondary | ICD-10-CM | POA: Diagnosis not present

## 2021-10-28 DIAGNOSIS — E11621 Type 2 diabetes mellitus with foot ulcer: Secondary | ICD-10-CM | POA: Diagnosis not present

## 2021-10-28 DIAGNOSIS — L97521 Non-pressure chronic ulcer of other part of left foot limited to breakdown of skin: Secondary | ICD-10-CM | POA: Diagnosis not present

## 2021-11-04 DIAGNOSIS — B351 Tinea unguium: Secondary | ICD-10-CM | POA: Diagnosis not present

## 2021-11-04 DIAGNOSIS — L851 Acquired keratosis [keratoderma] palmaris et plantaris: Secondary | ICD-10-CM | POA: Diagnosis not present

## 2021-11-04 DIAGNOSIS — E1159 Type 2 diabetes mellitus with other circulatory complications: Secondary | ICD-10-CM | POA: Diagnosis not present

## 2021-11-04 DIAGNOSIS — L97521 Non-pressure chronic ulcer of other part of left foot limited to breakdown of skin: Secondary | ICD-10-CM | POA: Diagnosis not present

## 2021-11-15 DIAGNOSIS — I255 Ischemic cardiomyopathy: Secondary | ICD-10-CM | POA: Diagnosis not present

## 2021-11-15 DIAGNOSIS — I5022 Chronic systolic (congestive) heart failure: Secondary | ICD-10-CM | POA: Diagnosis not present

## 2021-11-15 DIAGNOSIS — I480 Paroxysmal atrial fibrillation: Secondary | ICD-10-CM | POA: Diagnosis not present

## 2021-11-15 DIAGNOSIS — I10 Essential (primary) hypertension: Secondary | ICD-10-CM | POA: Diagnosis not present

## 2021-11-15 DIAGNOSIS — I2510 Atherosclerotic heart disease of native coronary artery without angina pectoris: Secondary | ICD-10-CM | POA: Diagnosis not present

## 2021-11-15 DIAGNOSIS — I493 Ventricular premature depolarization: Secondary | ICD-10-CM | POA: Diagnosis not present

## 2021-11-26 DIAGNOSIS — E1159 Type 2 diabetes mellitus with other circulatory complications: Secondary | ICD-10-CM | POA: Diagnosis not present

## 2021-11-26 DIAGNOSIS — L97521 Non-pressure chronic ulcer of other part of left foot limited to breakdown of skin: Secondary | ICD-10-CM | POA: Diagnosis not present

## 2021-12-03 DIAGNOSIS — I5022 Chronic systolic (congestive) heart failure: Secondary | ICD-10-CM | POA: Diagnosis not present

## 2021-12-03 DIAGNOSIS — M7989 Other specified soft tissue disorders: Secondary | ICD-10-CM | POA: Diagnosis not present

## 2021-12-03 DIAGNOSIS — I509 Heart failure, unspecified: Secondary | ICD-10-CM | POA: Diagnosis not present

## 2021-12-03 DIAGNOSIS — I4891 Unspecified atrial fibrillation: Secondary | ICD-10-CM | POA: Diagnosis not present

## 2021-12-03 DIAGNOSIS — I10 Essential (primary) hypertension: Secondary | ICD-10-CM | POA: Diagnosis not present

## 2021-12-07 ENCOUNTER — Inpatient Hospital Stay
Admission: EM | Admit: 2021-12-07 | Discharge: 2021-12-10 | DRG: 629 | Disposition: A | Discharge status: Home Health | Payer: PPO | Attending: Hospitalist | Admitting: Hospitalist

## 2021-12-07 ENCOUNTER — Emergency Department: Payer: PPO

## 2021-12-07 DIAGNOSIS — Z7901 Long term (current) use of anticoagulants: Secondary | ICD-10-CM

## 2021-12-07 DIAGNOSIS — E1169 Type 2 diabetes mellitus with other specified complication: Secondary | ICD-10-CM | POA: Diagnosis not present

## 2021-12-07 DIAGNOSIS — M1A9XX1 Chronic gout, unspecified, with tophus (tophi): Secondary | ICD-10-CM | POA: Diagnosis present

## 2021-12-07 DIAGNOSIS — I714 Abdominal aortic aneurysm, without rupture, unspecified: Secondary | ICD-10-CM | POA: Diagnosis present

## 2021-12-07 DIAGNOSIS — L03116 Cellulitis of left lower limb: Secondary | ICD-10-CM | POA: Diagnosis not present

## 2021-12-07 DIAGNOSIS — I10 Essential (primary) hypertension: Secondary | ICD-10-CM | POA: Diagnosis present

## 2021-12-07 DIAGNOSIS — I252 Old myocardial infarction: Secondary | ICD-10-CM | POA: Diagnosis not present

## 2021-12-07 DIAGNOSIS — A419 Sepsis, unspecified organism: Secondary | ICD-10-CM

## 2021-12-07 DIAGNOSIS — Z801 Family history of malignant neoplasm of trachea, bronchus and lung: Secondary | ICD-10-CM

## 2021-12-07 DIAGNOSIS — L02611 Cutaneous abscess of right foot: Secondary | ICD-10-CM | POA: Diagnosis present

## 2021-12-07 DIAGNOSIS — Z951 Presence of aortocoronary bypass graft: Secondary | ICD-10-CM

## 2021-12-07 DIAGNOSIS — M869 Osteomyelitis, unspecified: Secondary | ICD-10-CM | POA: Diagnosis not present

## 2021-12-07 DIAGNOSIS — I959 Hypotension, unspecified: Secondary | ICD-10-CM | POA: Diagnosis present

## 2021-12-07 DIAGNOSIS — R269 Unspecified abnormalities of gait and mobility: Secondary | ICD-10-CM | POA: Diagnosis not present

## 2021-12-07 DIAGNOSIS — E871 Hypo-osmolality and hyponatremia: Secondary | ICD-10-CM

## 2021-12-07 DIAGNOSIS — M7989 Other specified soft tissue disorders: Secondary | ICD-10-CM | POA: Diagnosis not present

## 2021-12-07 DIAGNOSIS — K219 Gastro-esophageal reflux disease without esophagitis: Secondary | ICD-10-CM | POA: Diagnosis present

## 2021-12-07 DIAGNOSIS — I2510 Atherosclerotic heart disease of native coronary artery without angina pectoris: Secondary | ICD-10-CM | POA: Diagnosis not present

## 2021-12-07 DIAGNOSIS — N1831 Chronic kidney disease, stage 3a: Secondary | ICD-10-CM | POA: Diagnosis present

## 2021-12-07 DIAGNOSIS — Z794 Long term (current) use of insulin: Secondary | ICD-10-CM | POA: Diagnosis not present

## 2021-12-07 DIAGNOSIS — S98111A Complete traumatic amputation of right great toe, initial encounter: Secondary | ICD-10-CM | POA: Diagnosis not present

## 2021-12-07 DIAGNOSIS — M7731 Calcaneal spur, right foot: Secondary | ICD-10-CM | POA: Diagnosis not present

## 2021-12-07 DIAGNOSIS — E119 Type 2 diabetes mellitus without complications: Secondary | ICD-10-CM | POA: Diagnosis not present

## 2021-12-07 DIAGNOSIS — Z872 Personal history of diseases of the skin and subcutaneous tissue: Secondary | ICD-10-CM | POA: Diagnosis not present

## 2021-12-07 DIAGNOSIS — L97529 Non-pressure chronic ulcer of other part of left foot with unspecified severity: Secondary | ICD-10-CM | POA: Diagnosis not present

## 2021-12-07 DIAGNOSIS — I5022 Chronic systolic (congestive) heart failure: Secondary | ICD-10-CM | POA: Diagnosis not present

## 2021-12-07 DIAGNOSIS — R7401 Elevation of levels of liver transaminase levels: Secondary | ICD-10-CM

## 2021-12-07 DIAGNOSIS — M009 Pyogenic arthritis, unspecified: Secondary | ICD-10-CM | POA: Diagnosis not present

## 2021-12-07 DIAGNOSIS — I130 Hypertensive heart and chronic kidney disease with heart failure and stage 1 through stage 4 chronic kidney disease, or unspecified chronic kidney disease: Secondary | ICD-10-CM | POA: Diagnosis present

## 2021-12-07 DIAGNOSIS — Z808 Family history of malignant neoplasm of other organs or systems: Secondary | ICD-10-CM

## 2021-12-07 DIAGNOSIS — I255 Ischemic cardiomyopathy: Secondary | ICD-10-CM | POA: Diagnosis not present

## 2021-12-07 DIAGNOSIS — I480 Paroxysmal atrial fibrillation: Secondary | ICD-10-CM | POA: Diagnosis present

## 2021-12-07 DIAGNOSIS — L03115 Cellulitis of right lower limb: Secondary | ICD-10-CM | POA: Diagnosis not present

## 2021-12-07 DIAGNOSIS — L02619 Cutaneous abscess of unspecified foot: Principal | ICD-10-CM

## 2021-12-07 DIAGNOSIS — E1142 Type 2 diabetes mellitus with diabetic polyneuropathy: Secondary | ICD-10-CM

## 2021-12-07 DIAGNOSIS — R600 Localized edema: Secondary | ICD-10-CM | POA: Diagnosis not present

## 2021-12-07 DIAGNOSIS — K429 Umbilical hernia without obstruction or gangrene: Secondary | ICD-10-CM | POA: Diagnosis present

## 2021-12-07 DIAGNOSIS — E785 Hyperlipidemia, unspecified: Secondary | ICD-10-CM | POA: Diagnosis present

## 2021-12-07 DIAGNOSIS — L97519 Non-pressure chronic ulcer of other part of right foot with unspecified severity: Secondary | ICD-10-CM | POA: Diagnosis not present

## 2021-12-07 DIAGNOSIS — I743 Embolism and thrombosis of arteries of the lower extremities: Secondary | ICD-10-CM | POA: Diagnosis not present

## 2021-12-07 DIAGNOSIS — E11628 Type 2 diabetes mellitus with other skin complications: Secondary | ICD-10-CM | POA: Diagnosis not present

## 2021-12-07 DIAGNOSIS — Z823 Family history of stroke: Secondary | ICD-10-CM

## 2021-12-07 DIAGNOSIS — E11621 Type 2 diabetes mellitus with foot ulcer: Secondary | ICD-10-CM | POA: Diagnosis present

## 2021-12-07 DIAGNOSIS — I4891 Unspecified atrial fibrillation: Secondary | ICD-10-CM | POA: Diagnosis not present

## 2021-12-07 DIAGNOSIS — N200 Calculus of kidney: Secondary | ICD-10-CM

## 2021-12-07 DIAGNOSIS — Z87891 Personal history of nicotine dependence: Secondary | ICD-10-CM

## 2021-12-07 DIAGNOSIS — E1122 Type 2 diabetes mellitus with diabetic chronic kidney disease: Secondary | ICD-10-CM | POA: Diagnosis not present

## 2021-12-07 DIAGNOSIS — M25474 Effusion, right foot: Secondary | ICD-10-CM | POA: Diagnosis not present

## 2021-12-07 DIAGNOSIS — L97509 Non-pressure chronic ulcer of other part of unspecified foot with unspecified severity: Secondary | ICD-10-CM | POA: Diagnosis not present

## 2021-12-07 DIAGNOSIS — E1165 Type 2 diabetes mellitus with hyperglycemia: Secondary | ICD-10-CM | POA: Diagnosis not present

## 2021-12-07 DIAGNOSIS — M19071 Primary osteoarthritis, right ankle and foot: Secondary | ICD-10-CM | POA: Diagnosis not present

## 2021-12-07 DIAGNOSIS — Z6835 Body mass index (BMI) 35.0-35.9, adult: Secondary | ICD-10-CM

## 2021-12-07 DIAGNOSIS — Z79899 Other long term (current) drug therapy: Secondary | ICD-10-CM

## 2021-12-07 DIAGNOSIS — Z9889 Other specified postprocedural states: Secondary | ICD-10-CM | POA: Diagnosis not present

## 2021-12-07 LAB — COMPREHENSIVE METABOLIC PANEL
ALT: 147 U/L — ABNORMAL HIGH (ref 0–44)
AST: 113 U/L — ABNORMAL HIGH (ref 15–41)
Albumin: 3 g/dL — ABNORMAL LOW (ref 3.5–5.0)
Alkaline Phosphatase: 121 U/L (ref 38–126)
Anion gap: 7 (ref 5–15)
BUN: 22 mg/dL (ref 8–23)
CO2: 25 mmol/L (ref 22–32)
Calcium: 8.8 mg/dL — ABNORMAL LOW (ref 8.9–10.3)
Chloride: 99 mmol/L (ref 98–111)
Creatinine, Ser: 1.37 mg/dL — ABNORMAL HIGH (ref 0.61–1.24)
GFR, Estimated: 53 mL/min — ABNORMAL LOW (ref >60)
Glucose, Bld: 116 mg/dL — ABNORMAL HIGH (ref 70–99)
Potassium: 4 mmol/L (ref 3.5–5.1)
Sodium: 131 mmol/L — ABNORMAL LOW (ref 135–145)
Total Bilirubin: 1 mg/dL (ref 0.3–1.2)
Total Protein: 6.9 g/dL (ref 6.5–8.1)

## 2021-12-07 LAB — CBC WITH DIFFERENTIAL/PLATELET
Abs Immature Granulocytes: 0.04 10*3/uL (ref 0.00–0.07)
Basophils Absolute: 0.1 10*3/uL (ref 0.0–0.1)
Basophils Relative: 1 %
Eosinophils Absolute: 0.1 10*3/uL (ref 0.0–0.5)
Eosinophils Relative: 1 %
HCT: 38.1 % — ABNORMAL LOW (ref 39.0–52.0)
Hemoglobin: 12.1 g/dL — ABNORMAL LOW (ref 13.0–17.0)
Immature Granulocytes: 0 %
Lymphocytes Relative: 11 %
Lymphs Abs: 1.2 10*3/uL (ref 0.7–4.0)
MCH: 27.1 pg (ref 26.0–34.0)
MCHC: 31.8 g/dL (ref 30.0–36.0)
MCV: 85.4 fL (ref 80.0–100.0)
Monocytes Absolute: 1.2 10*3/uL — ABNORMAL HIGH (ref 0.1–1.0)
Monocytes Relative: 11 %
Neutro Abs: 8.5 10*3/uL — ABNORMAL HIGH (ref 1.7–7.7)
Neutrophils Relative %: 76 %
Platelets: 283 10*3/uL (ref 150–400)
RBC: 4.46 MIL/uL (ref 4.22–5.81)
RDW: 13.6 % (ref 11.5–15.5)
WBC: 11.1 10*3/uL — ABNORMAL HIGH (ref 4.0–10.5)
nRBC: 0 % (ref 0.0–0.2)

## 2021-12-07 LAB — LACTIC ACID, PLASMA
Lactic Acid, Venous: 1.7 mmol/L (ref 0.5–1.9)
Lactic Acid, Venous: 1.5 mmol/L (ref 0.5–1.9)

## 2021-12-07 LAB — FERRITIN: Ferritin: 447 ng/mL — ABNORMAL HIGH (ref 24–336)

## 2021-12-07 LAB — IRON AND TIBC
Iron: 24 ug/dL — ABNORMAL LOW (ref 45–182)
Saturation Ratios: 13 % — ABNORMAL LOW (ref 17.9–39.5)
TIBC: 183 ug/dL — ABNORMAL LOW (ref 250–450)
UIBC: 159 ug/dL

## 2021-12-07 LAB — ACETAMINOPHEN LEVEL: Acetaminophen (Tylenol), Serum: <10 ug/mL — ABNORMAL LOW (ref 10–30)

## 2021-12-07 LAB — GLUCOSE, CAPILLARY: Glucose-Capillary: 122 mg/dL — ABNORMAL HIGH (ref 70–99)

## 2021-12-07 IMAGING — DX DG CHEST 1V PORT
1 series · 1 of 1 positions shown · non-contrast
Comparison: Chest x-ray [DATE]

CLINICAL DATA: Leg swelling

EXAM:
PORTABLE CHEST 1 VIEW

[chest ap]
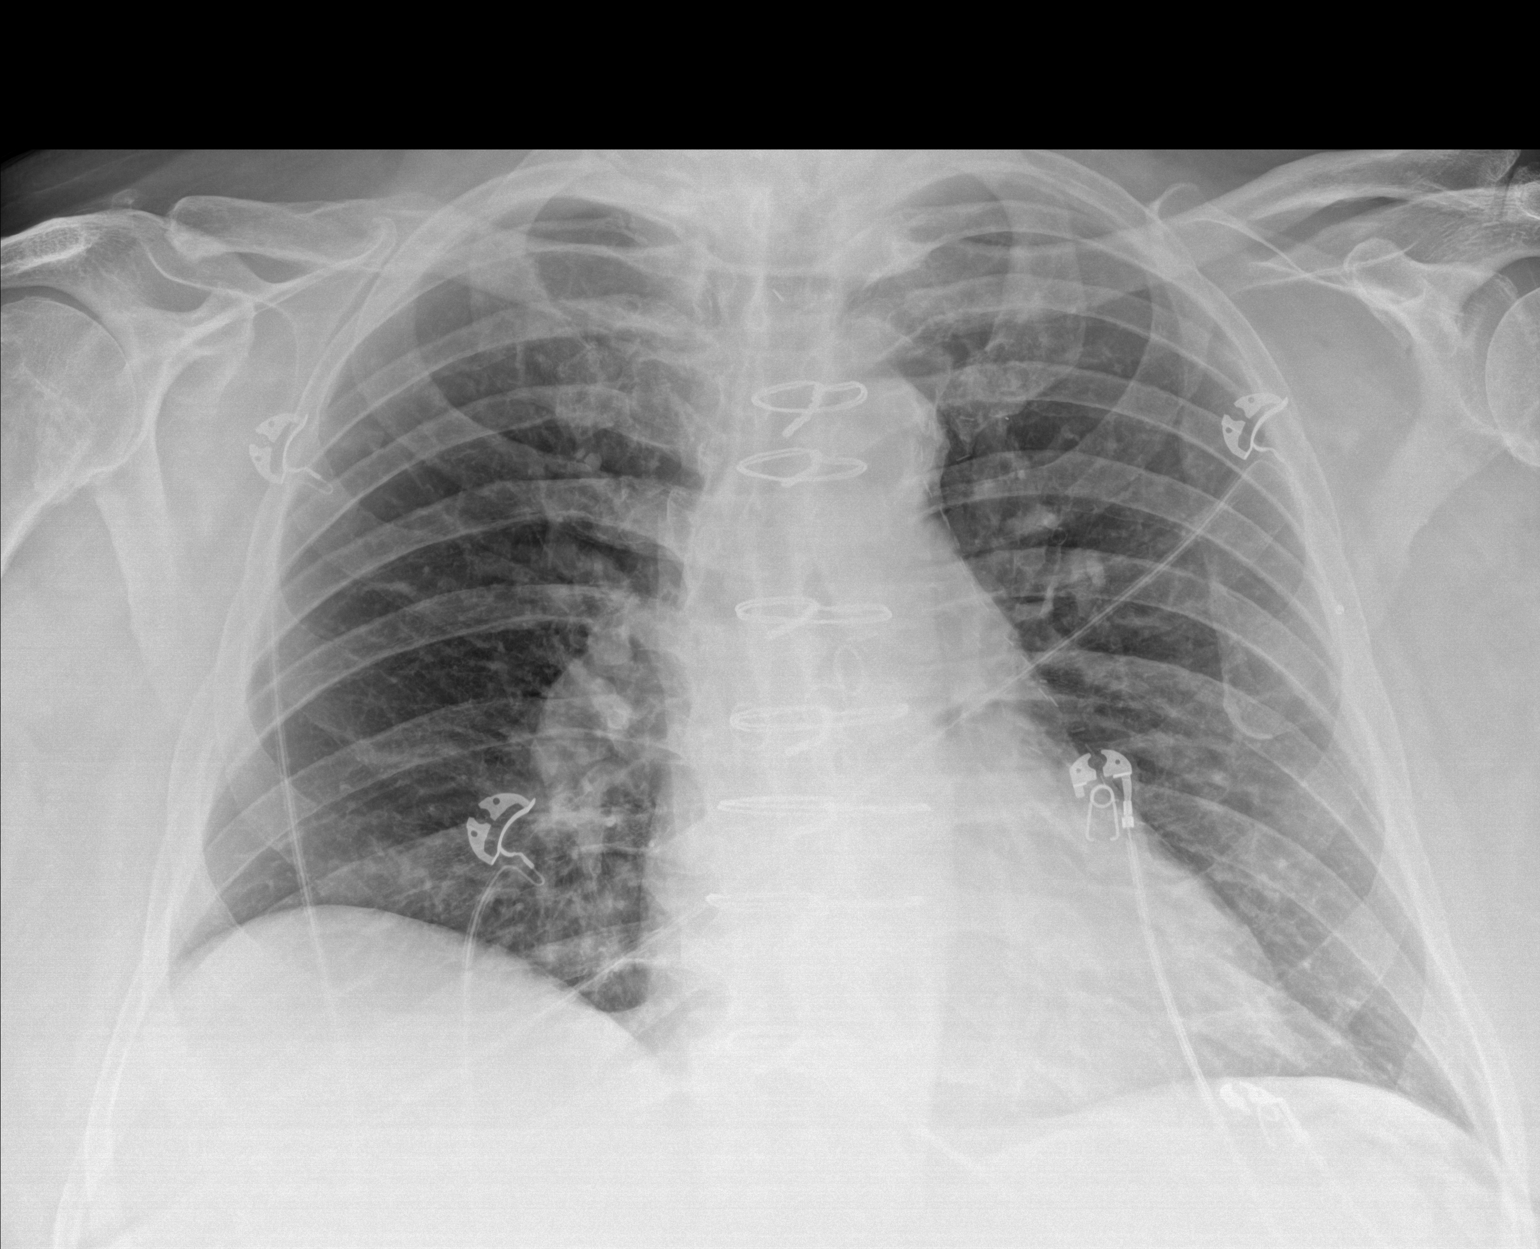

[1 of 1 positions shown; findings below may reference images not displayed]

FINDINGS: Heart size and mediastinal contours are within normal limits.
Cardiac surgical changes and median sternotomy wires. No suspicious
pulmonary opacities identified.

No pleural effusion or pneumothorax visualized.

No acute osseous abnormality appreciated.
IMPRESSION: No acute intrathoracic process identified.

## 2021-12-07 IMAGING — DX DG FOOT COMPLETE 3+V*R*
3 series · 3 of 3 positions shown · non-contrast
Comparison: Right foot x-ray [DATE]

CLINICAL DATA: Leg swelling, foot abscess

EXAM:
RIGHT FOOT COMPLETE - 3+ VIEW

[foot obl (1 of 2)]
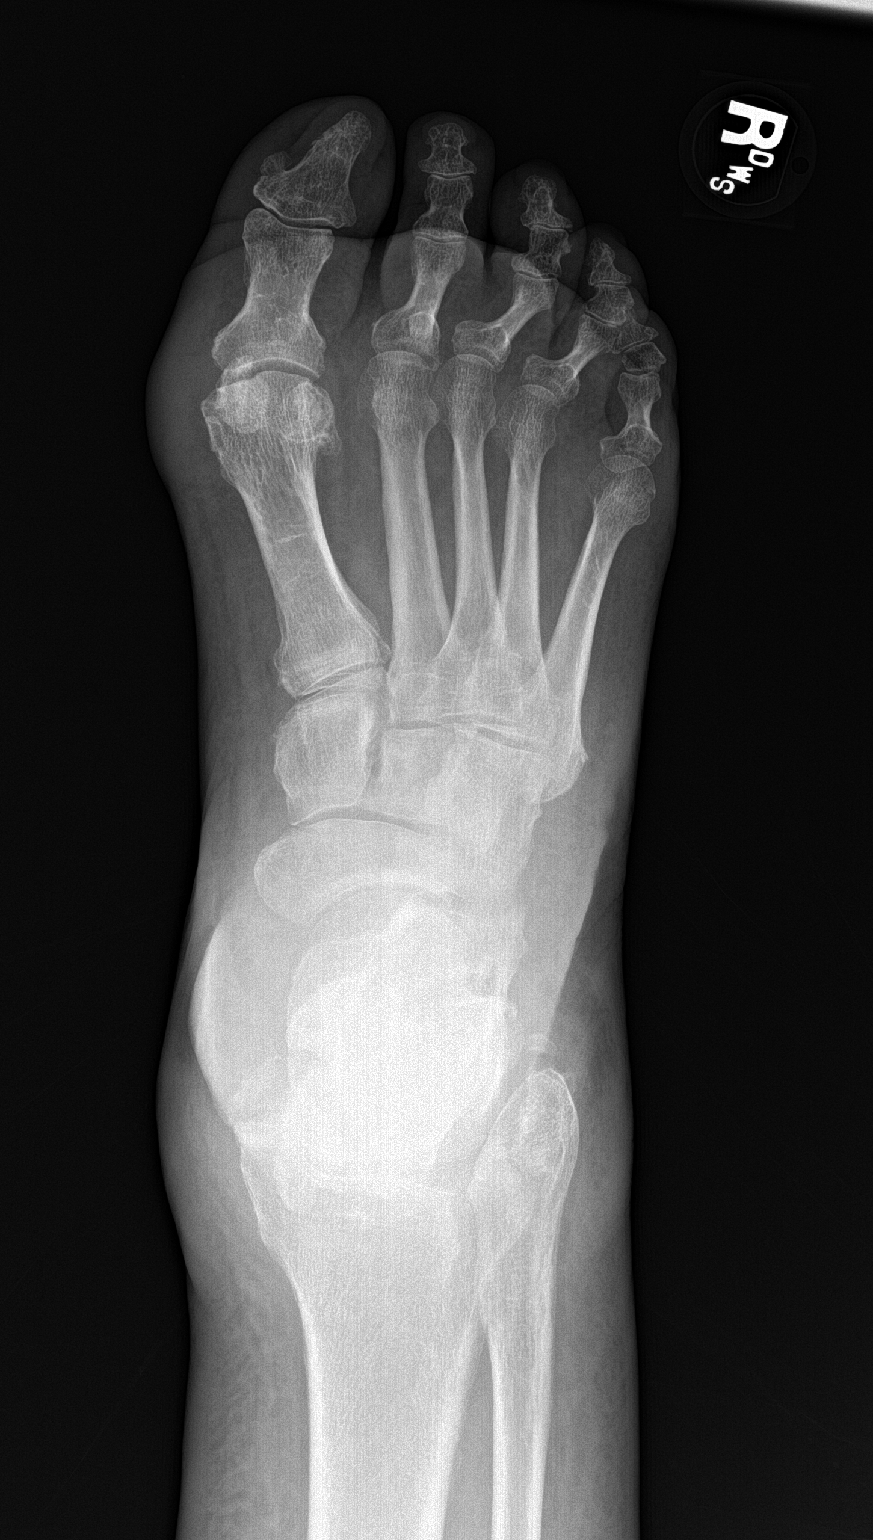

[foot lat]
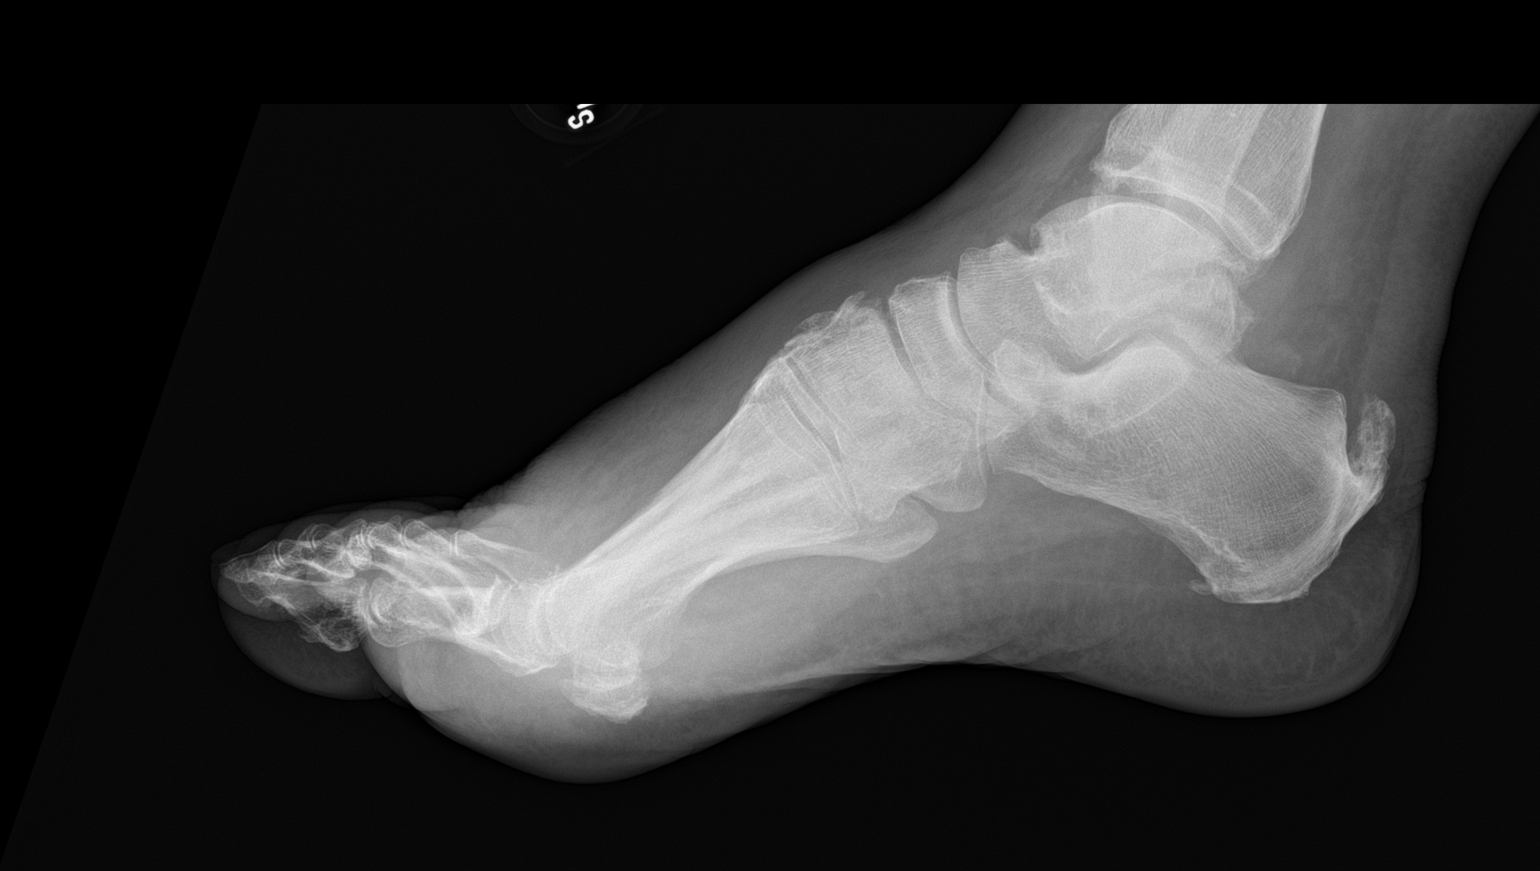

[foot obl (2 of 2)]
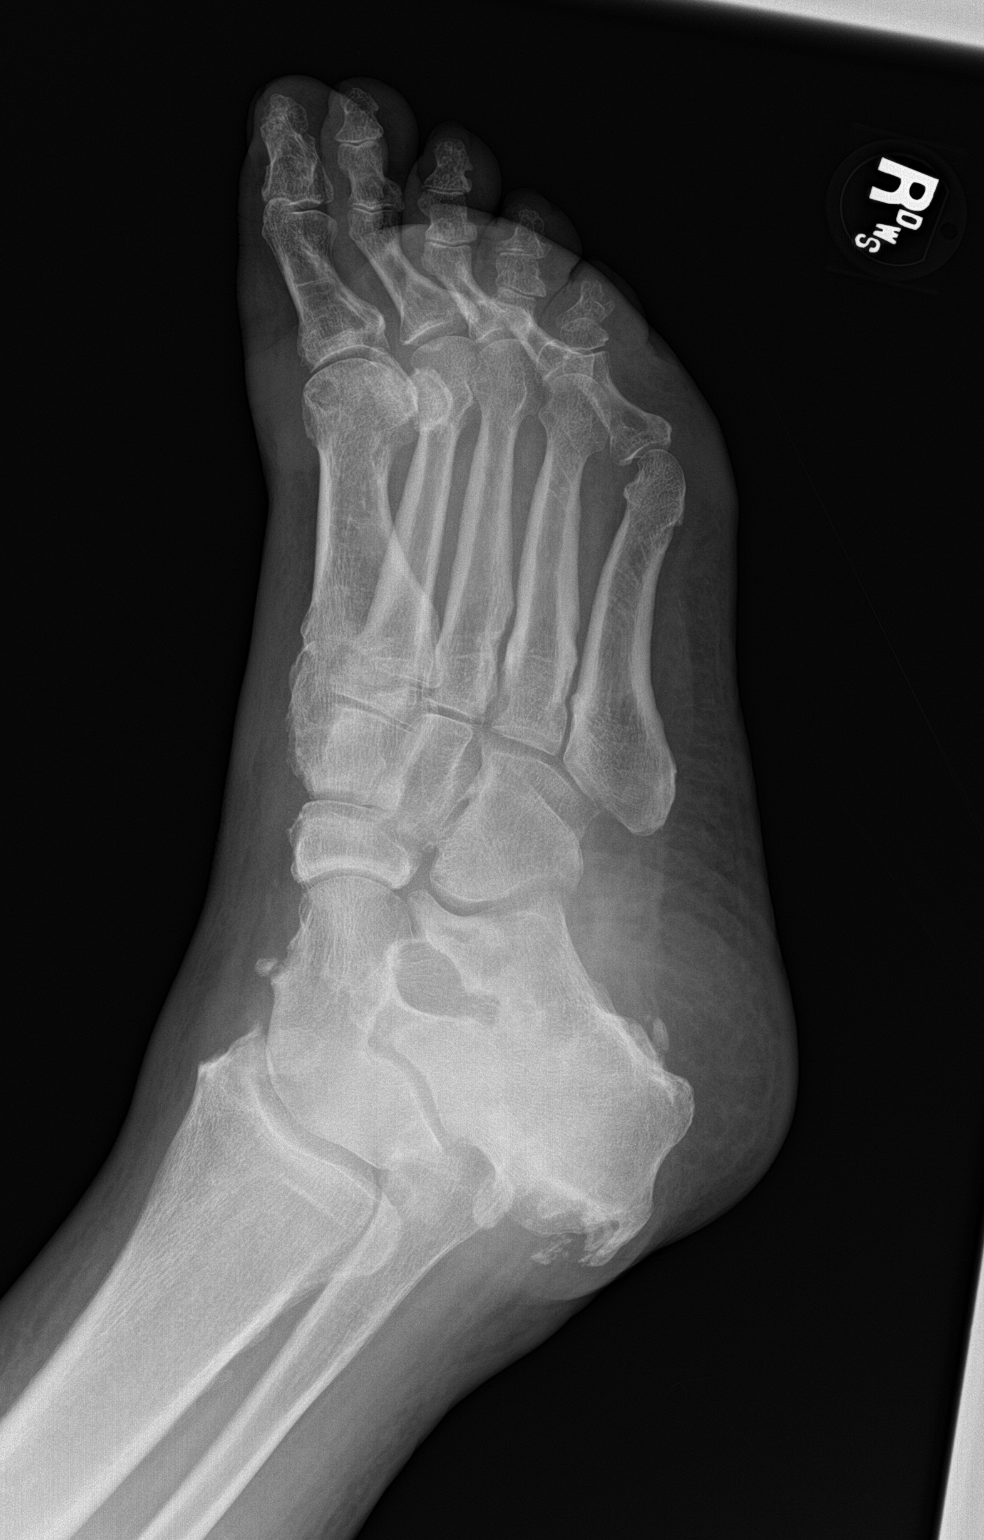

[3 of 3 positions shown; findings below may reference images not displayed]

FINDINGS: No acute fracture or dislocation identified. No bone destruction or
periosteal reaction visualized. Mild hallux valgus deformity.
Degenerative osteophytes at the first metatarsophalangeal joint and
at the dorsal midfoot. Large posterior and small plantar calcaneal
spurs. Diffuse soft tissue swelling most prominent in the forefoot
and medial to the first metatarsophalangeal joint.
IMPRESSION: 1. No acute osseous abnormality identified. Chronic changes as
described.
2. Diffuse soft tissue swelling.

## 2021-12-07 MED ORDER — SODIUM CHLORIDE 0.9 % IV BOLUS
500.0000 mL | Freq: Once | INTRAVENOUS | Status: AC
  Administered 2021-12-07: 500 mL via INTRAVENOUS

## 2021-12-07 MED ORDER — ONDANSETRON HCL 4 MG/2ML IJ SOLN: 4.0000 mg | Freq: Four times a day (QID) | INTRAMUSCULAR | Status: DC | PRN

## 2021-12-07 MED ORDER — IRBESARTAN 75 MG PO TABS
37.5000 mg | ORAL_TABLET | Freq: Every day | ORAL | Status: DC
Start: 2021-12-08 — End: 2021-12-10
  Administered 2021-12-08 – 2021-12-10 (×2): 37.5 mg via ORAL
  Filled 2021-12-07 (×3): qty 0.5

## 2021-12-07 MED ORDER — MEXILETINE HCL 200 MG PO CAPS
200.0000 mg | ORAL_CAPSULE | Freq: Four times a day (QID) | ORAL | Status: DC
  Administered 2021-12-07 – 2021-12-10 (×8): 200 mg via ORAL
  Filled 2021-12-07 (×12): qty 1

## 2021-12-07 MED ORDER — VANCOMYCIN HCL IN DEXTROSE 1-5 GM/200ML-% IV SOLN
1000.0000 mg | Freq: Once | INTRAVENOUS | Status: AC
  Administered 2021-12-07: 1000 mg via INTRAVENOUS
  Filled 2021-12-07: qty 200

## 2021-12-07 MED ORDER — GABAPENTIN 300 MG PO CAPS
300.0000 mg | ORAL_CAPSULE | Freq: Four times a day (QID) | ORAL | Status: DC
  Administered 2021-12-07 – 2021-12-10 (×9): 300 mg via ORAL
  Filled 2021-12-07 (×10): qty 1

## 2021-12-07 MED ORDER — TRAZODONE HCL 50 MG PO TABS
50.0000 mg | ORAL_TABLET | Freq: Every evening | ORAL | Status: DC | PRN
  Administered 2021-12-07 – 2021-12-10 (×3): 50 mg via ORAL
  Filled 2021-12-07 (×3): qty 1

## 2021-12-07 MED ORDER — APIXABAN 5 MG PO TABS
5.0000 mg | ORAL_TABLET | Freq: Two times a day (BID) | ORAL | Status: DC
  Administered 2021-12-07 – 2021-12-10 (×5): 5 mg via ORAL
  Filled 2021-12-07 (×6): qty 1

## 2021-12-07 MED ORDER — OXYCODONE HCL 5 MG PO TABS
5.0000 mg | ORAL_TABLET | ORAL | Status: DC | PRN
  Administered 2021-12-10: 5 mg via ORAL
  Filled 2021-12-07: qty 1

## 2021-12-07 MED ORDER — FUROSEMIDE 40 MG PO TABS
40.0000 mg | ORAL_TABLET | Freq: Every day | ORAL | Status: DC
Start: 2021-12-07 — End: 2021-12-08
  Administered 2021-12-07 – 2021-12-08 (×2): 40 mg via ORAL
  Filled 2021-12-07 (×2): qty 1

## 2021-12-07 MED ORDER — VANCOMYCIN HCL 1500 MG/300ML IV SOLN
1500.0000 mg | Freq: Once | INTRAVENOUS | Status: AC
  Administered 2021-12-07: 1500 mg via INTRAVENOUS
  Filled 2021-12-07: qty 300

## 2021-12-07 MED ORDER — TRAMADOL HCL 50 MG PO TABS
50.0000 mg | ORAL_TABLET | Freq: Three times a day (TID) | ORAL | Status: DC | PRN
  Administered 2021-12-10: 50 mg via ORAL
  Filled 2021-12-07: qty 1

## 2021-12-07 MED ORDER — INSULIN ASPART 100 UNIT/ML IJ SOLN
0.0000 [IU] | Freq: Three times a day (TID) | INTRAMUSCULAR | Status: DC
  Administered 2021-12-07 – 2021-12-08 (×2): 1 [IU] via SUBCUTANEOUS
  Administered 2021-12-08: 2 [IU] via SUBCUTANEOUS
  Administered 2021-12-09 – 2021-12-10 (×2): 1 [IU] via SUBCUTANEOUS
  Filled 2021-12-07 (×4): qty 1

## 2021-12-07 MED ORDER — CARVEDILOL 12.5 MG PO TABS
12.5000 mg | ORAL_TABLET | Freq: Two times a day (BID) | ORAL | Status: DC
  Administered 2021-12-07 – 2021-12-10 (×4): 12.5 mg via ORAL
  Filled 2021-12-07 (×5): qty 1

## 2021-12-07 MED ORDER — LIDOCAINE HCL (PF) 1 % IJ SOLN
5.0000 mL | Freq: Once | INTRAMUSCULAR | Status: AC
  Administered 2021-12-07: 5 mL via INTRADERMAL
  Filled 2021-12-07: qty 5

## 2021-12-07 MED ORDER — VANCOMYCIN HCL 1250 MG/250ML IV SOLN
1250.0000 mg | INTRAVENOUS | Status: DC
  Administered 2021-12-08: 1250 mg via INTRAVENOUS
  Filled 2021-12-07 (×3): qty 250

## 2021-12-07 MED ORDER — POLYETHYLENE GLYCOL 3350 17 G PO PACK
17.0000 g | PACK | Freq: Every day | ORAL | Status: DC | PRN
  Filled 2021-12-07: qty 1

## 2021-12-07 MED ORDER — SODIUM CHLORIDE 0.9 % IV SOLN
1.0000 g | Freq: Once | INTRAVENOUS | Status: AC
  Administered 2021-12-07: 1 g via INTRAVENOUS
  Filled 2021-12-07: qty 10

## 2021-12-07 MED ORDER — ONDANSETRON HCL 4 MG PO TABS: 4.0000 mg | ORAL_TABLET | Freq: Four times a day (QID) | ORAL | Status: DC | PRN

## 2021-12-07 MED ORDER — MORPHINE SULFATE (PF) 2 MG/ML IV SOLN: 2.0000 mg | INTRAVENOUS | Status: DC | PRN

## 2021-12-07 NOTE — Consult Note (Signed)
PHARMACY -  BRIEF ANTIBIOTIC NOTE  ? ?Pharmacy has received consult(s) for Vancomycin from an ED provider.  The patient's profile has been reviewed for ht/wt/allergies/indication/available labs.   ? ?One time order(s) placed for  ?Vancomycin 1000 mg + 1500 mg for total LD of 2500 mg  ? ? ?Further antibiotics/pharmacy consults should be ordered by admitting physician if indicated.       ?                ?Thank you, ?Sharen Hones, PharmD, BCPS ?Clinical Pharmacist   ?12/07/2021  1:52 PM  ?

## 2021-12-07 NOTE — ED Triage Notes (Signed)
Patient to ER via [facility] with complaints of bilateral leg swelling x2 weeks. Patient reports he has been taking his fluid pill as prescribed but has not been producing as much urine as he would have thought.  ? ?Patient also reports having an abscess present to his right foot.  ?

## 2021-12-07 NOTE — Plan of Care (Signed)
?  Problem: Clinical Measurements: ?Goal: Ability to avoid or minimize complications of infection will improve ?Outcome: Progressing ?  ?Problem: Skin Integrity: ?Goal: Skin integrity will improve ?Outcome: Progressing ?  ?

## 2021-12-07 NOTE — Assessment & Plan Note (Addendum)
Status post I&D of large abscess in the ED.   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 ?Plan: ?--FIRST RAY AMPUTATION today ?--cont vanc and ceftriaxone ?

## 2021-12-07 NOTE — H&P (Signed)
?History and Physical  ? ? ?Patient: [name] [name] WLS:[id number] DOB: Jan 22, 1941 ?DOA: 12/07/2021 ?DOS: the patient was seen and examined on 12/07/2021 ?PCP: [name], MD  ?Patient coming from: Home ? ?Chief Complaint:  ?Chief Complaint  ?Patient presents with  ? Leg Swelling  ? ?HPI: ABDULAHI [name] is a 81 y.o. male with medical history significant for CAD, chronic systolic CHF, morbid obesity, type 2 diabetes, hyperlipidemia, hypertension, nephrolithiasis, AAA, A-fib, who presents with pain and swelling of the right foot. ? ?Patient reports that he has had progressive problems with his right foot for about the past month.  He has a chronic left great toe ulceration that has been followed by podiatry for some time.  He reports progressive pain, swelling, erythema of his right foot and when last seen by podiatry there was some concern for possible gout.  Earlier this morning the pain and swelling became so bad that he attempted to lance it with a needle at home but decided this was a bad idea and came to the hospital instead.  Does endorse taking some Tylenol for pain but reports he has been instructed previously to only take a maximum of 4 pills a day which he has been doing. ? ?Per signout from ED provider he performed a bedside I&D of a large abscess with copious amounts of purulent material evacuated. ? ?While in the emergency department vital signs were notable mostly for low blood pressures initially in the 70s over 40s that improved to the 90s over 50s.  Lab work-up notable for elevated white count at 11.1, CMP showing creatinine at baseline at 1.3, AST 113 and ALT 147 which are well above his baseline.  Lactic acid was normal.  Sepsis protocol was activated, blood cultures were obtained, repeat lactic acid was pending.  Plain film of the right foot showed no evidence of osteo.  Patient was given 500 L NS bolus and started on ceftriaxone and vancomycin.  Given soft blood pressures, sepsis,  elevated LFTs, he was admitted for further management. ? ? ?Review of Systems: As mentioned in the history of present illness. All other systems reviewed and are negative. ?Past Medical History:  ?Diagnosis Date  ? AAA (abdominal aortic aneurysm) (HCC)   ? ACE inhibitor associated hyperkalemia 09/20/2015  ? Arthritis   ? CAD (coronary artery disease)   ? a. 03/2014 NSTEMI--> 05/2014 CABG x 5 (LIMA->D1, VG->LAD, VG->OM1, VG->OM2, VG->RPDA);  c. 04/2015 Cath: LM nl, LAD 100ost, 40d, RI 80, LCX 80p/m, OM1 70, OM2 80, RCA 20p, 70m, 90d, VG->dLAD min irregs, VG->OM1 100, VG->OM2 100, VG->RPDA nl, LIMA->D2 nl.  ? Chronic systolic CHF (congestive heart failure) (HCC)   ? a. EF 25-30% by 2D ECHO 04/20/14. Severely dec global hypokinesis. mildly elevated RVSP;  b. 12/2014 Echo: EF 40-45%, mod dil LA, mildly dil RV/RA;  c. 04/2015 Echo: EF 30-35%, sev antsept HK, Gr1 DD, mildly dil RA/LA;  d. 07/2015 Echo: EF 35-40%, ant/antsept HK, Gr1 DD, mildly red RV fxn.  ? Diabetes mellitus without complication (HCC)   ? Epigastric hernia   ? Frequent PVCs   ? a. 24 hr Holter 06/2015: >28K PVCs accounting for 25% of all beats, rare PAC-->mexilitene started;  b. 07/2015 Holter: 1700 PVC's/24 hrs.  ? GERD (gastroesophageal reflux disease)   ? Gout   ? Hyperlipidemia   ? Hypertension   ? Ischemic cardiomyopathy   ? a. EF 25-30% 03/2014;  b. EF 40-45% 12/2014.  ? Mitral regurgitation   ? a.  03/2014 TEE: mild to mod MR.  ? Nephrolithiasis   ? Neuromuscular disorder (HCC)   ? DIABETIC NEUROPATHY  ? Obesity   ? Umbilical hernia   ? ?Past Surgical History:  ?Procedure Laterality Date  ? CARDIAC CATHETERIZATION    ? [facility]  ? CARDIAC CATHETERIZATION N/A 05/23/2015  ? Procedure: Left Heart Cath and Coronary Angiography;  Surgeon: Iran Ouch, MD;  Location: [facility];  Service: Cardiovascular;  Laterality: N/A;  ? CARDIOVERSION N/A 01/30/2020  ? Procedure: CARDIOVERSION;  Surgeon: Iran Ouch, MD;  Location: [facility] ORS;  Service:  Cardiovascular;  Laterality: N/A;  ? CARDIOVERSION N/A 08/09/2020  ? Procedure: CARDIOVERSION;  Surgeon: Dalia Heading, MD;  Location: [facility] ORS;  Service: Cardiovascular;  Laterality: N/A;  ? CARPAL TUNNEL RELEASE Right   ? CORONARY ARTERY BYPASS GRAFT N/A 06/06/2014  ? Procedure: CORONARY ARTERY BYPASS GRAFTING (CABG) x 5 USING LEFT AND RIGHT SAPHENOUS LEG VEIN HARVESTED ENDOSCOPICALLY;  Surgeon: Kerin Perna, MD;  Location: [facility] OR;  Service: Open Heart Surgery;  Laterality: N/A;  ? ESOPHAGOGASTRODUODENOSCOPY (EGD) WITH PROPOFOL N/A 02/11/2016  ? Procedure: ESOPHAGOGASTRODUODENOSCOPY (EGD) WITH PROPOFOL with dialation;  Surgeon: [name], MD;  Location: [facility];  Service: Endoscopy;  Laterality: N/A;  diabetic, oral meds  ? INTRAOPERATIVE TRANSESOPHAGEAL ECHOCARDIOGRAM N/A 06/06/2014  ? Procedure: INTRAOPERATIVE TRANSESOPHAGEAL ECHOCARDIOGRAM;  Surgeon: Kerin Perna, MD;  Location: [facility] OR;  Service: Open Heart Surgery;  Laterality: N/A;  ? ROTATOR CUFF REPAIR Right 1996,1997,2000  ? TEE WITHOUT CARDIOVERSION N/A 04/24/2014  ? Procedure: TRANSESOPHAGEAL ECHOCARDIOGRAM (TEE);  Surgeon: [name], MD;  Location: [facility] ENDOSCOPY;  Service: Cardiovascular;  Laterality: N/A;  ? ?Social History:  reports that he has quit smoking. His smoking use included cigarettes. He has a 25.00 pack-year smoking history. He has quit using smokeless tobacco.  His smokeless tobacco use included chew. He reports that he does not drink alcohol and does not use drugs. ? ?Allergies  ?Allergen Reactions  ? Glimepiride   ?  Unusual sweating and irregular heart beat  ? ? ?Family History  ?Problem Relation Age of Onset  ? Throat cancer Father   ? Stroke Father   ? Colon polyps Brother   ? Lung cancer Brother   ? Prostate cancer Neg Hx   ? Bladder Cancer Neg Hx   ? Kidney cancer Neg Hx   ? ? ?Prior to Admission medications   ?Medication Sig Start Date End Date Taking? Authorizing Provider  ?acetaminophen (TYLENOL) 500 MG  tablet Take 500-1,000 mg by mouth every 6 (six) hours as needed for moderate pain.    [provider]  ?apixaban (ELIQUIS) 5 MG TABS tablet Take 1 tablet (5 mg total) by mouth 2 (two) times daily. 07/05/20 01/01/21  Iran Ouch, MD  ?atorvastatin (LIPITOR) 40 MG tablet TAKE 1 TABLET BY MOUTH ONCE DAILY AT 6 PM. ?Patient taking differently: Take 40 mg by mouth daily. 07/16/20   Iran Ouch, MD  ?carvedilol (COREG) 12.5 MG tablet TAKE 1 TABLET BY MOUTH TWICE DAILY WITH A MEAL ?Patient not taking: Reported on 08/01/2020 05/14/20   Iran Ouch, MD  ?clotrimazole-betamethasone (LOTRISONE) cream Apply 1 application topically 2 (two) times daily as needed (skin irritation/breakouts).  12/06/19   [provider]  ?colchicine 0.6 MG tablet Take 0.6 mg by mouth daily as needed (for gout flareup).     [provider]  ?collagenase (SANTYL) ointment Apply topically daily. ?Patient not taking: Reported on 08/01/2020  07/02/20   Arnetha Courser, MD  ?diphenhydramine-acetaminophen (TYLENOL PM) 25-500 MG TABS tablet Take 1 tablet by mouth at bedtime as needed (sleep).    [provider]  ?Elastic Bandages & Supports (BANDAGE ROLL 4.5"X4YD) MISC 1 units of lipase by Does not apply route daily. 07/02/20   Arnetha Courser, MD  ?furosemide (LASIX) 40 MG tablet Take 1 tablet by mouth once daily ?Patient taking differently: Take 40 mg by mouth daily. 02/09/20   Iran Ouch, MD  ?gabapentin (NEURONTIN) 300 MG capsule Take 300 mg by mouth 4 (four) times daily.  01/02/14   [provider]  ?mexiletine (MEXITIL) 150 MG capsule Take 300 mg by mouth 2 (two) times daily.    [provider]  ?nitroGLYCERIN (NITROSTAT) 0.4 MG SL tablet Place 1 tablet (0.4 mg total) under the tongue every 5 (five) minutes as needed for chest pain. ?Patient not taking: Reported on 08/09/2020 08/31/17   Sondra Barges, PA-C  ?povidone-iodine (BETADINE) 10 % external solution Apply 1 application topically as  needed for wound care. ?Patient taking differently: Apply 1 application topically daily.  07/02/20   Arnetha Courser, MD  ?sacubitril-valsartan (ENTRESTO) 24-26 MG Take 1 tablet by mouth 2 (two) times daily. ?Patient not ta

## 2021-12-07 NOTE — ED Provider Notes (Addendum)
? ?[facility] ?Provider Note ? ? ? Event Date/Time  ? First MD Initiated Contact with Patient 12/07/21 1141   ?  (approximate) ? ? ?History  ? ?Leg Swelling ? ? ?HPI ? ?[name] [name] is a 81 y.o. male presents to the ER for evaluation of severe bilateral lower extremity swelling as well as pain in the right foot medial to the base of the toe.  Patient does have a history of foot ulcers and infections.  Thinks that he is developing abscess in the left foot.  Is not been on any recent antibiotics.  He denies any chest pain or shortness of breath.  Did take his blood pressure medications. ?  ? ? ?Physical Exam  ? ?Triage Vital Signs: ?ED Triage Vitals  ?Enc Vitals Group  ?   BP 12/07/21 1136 (!) 73/45  ?   Pulse Rate 12/07/21 1135 93  ?   Resp 12/07/21 1135 20  ?   Temp 12/07/21 1135 98.6 ?F (37 ?C)  ?   Temp Source 12/07/21 1135 Oral  ?   SpO2 12/07/21 1135 95 %  ?   Weight --   ?   Height 12/07/21 1136 6' (1.829 m)  ?   Head Circumference --   ?   Peak Flow --   ?   Pain Score 12/07/21 1136 10  ?   Pain Loc --   ?   Pain Edu? --   ?   Excl. in GC? --   ? ? ?Most recent vital signs: ?Vitals:  ? 12/07/21 1200 12/07/21 1300  ?BP: (!) 87/42 (!) 99/54  ?Pulse: 91 80  ?Resp: 20 16  ?Temp:    ?SpO2: 94% 95%  ? ? ? ?Constitutional: Alert  ?Eyes: Conjunctivae are normal.  ?Head: Atraumatic. ?Nose: No congestion/rhinnorhea. ?Mouth/Throat: Mucous membranes are moist.   ?Neck: Painless ROM.  ?Cardiovascular:   Good peripheral circulation. ?Respiratory: Normal respiratory effort.  No retractions.  ?Gastrointestinal: Soft and nontender.  ?Musculoskeletal: Area of fluctuance and tenderness over the medial PIP of the right great toe with surrounding cellulitic changes warmth to lower extremity.  No pain with ranging the toe.  No foreign body no laceration.  Brisk cap refill. ?Neurologic:  MAE spontaneously. No gross focal neurologic deficits are appreciated.  ?Skin:  Skin is warm, dry and intact. No rash  noted. ?Psychiatric: Mood and affect are normal. Speech and behavior are normal. ? ? ? ?ED Results / Procedures / Treatments  ? ?Labs ?(all labs ordered are listed, but only abnormal results are displayed) ?Labs Reviewed  ?COMPREHENSIVE METABOLIC PANEL - Abnormal; Notable for the following components:  ?    Result Value  ? Sodium 131 (*)   ? Glucose, Bld 116 (*)   ? Creatinine, Ser 1.37 (*)   ? Calcium 8.8 (*)   ? Albumin 3.0 (*)   ? AST 113 (*)   ? ALT 147 (*)   ? GFR, Estimated 53 (*)   ? All other components within normal limits  ?CBC WITH DIFFERENTIAL/PLATELET - Abnormal; Notable for the following components:  ? WBC 11.1 (*)   ? Hemoglobin 12.1 (*)   ? HCT 38.1 (*)   ? Neutro Abs 8.5 (*)   ? Monocytes Absolute 1.2 (*)   ? All other components within normal limits  ?CULTURE, BLOOD (ROUTINE X 2)  ?CULTURE, BLOOD (ROUTINE X 2)  ?AEROBIC/ANAEROBIC CULTURE W GRAM STAIN (SURGICAL/DEEP WOUND)  ?LACTIC ACID, PLASMA  ?LACTIC ACID, PLASMA  ?  URINALYSIS, ROUTINE W REFLEX MICROSCOPIC  ? ? ? ?EKG ? ?ED ECG REPORT ?I, Willy Eddy, the attending physician, personally viewed and interpreted this ECG. ? ? Date: 12/07/2021 ? EKG Time: 11:48 ? Rate: 95 ? Rhythm: aflutter ? Axis: normal ? Intervals:normal qt ? ST&T Change: no stemi, no depression ? ? ? ?RADIOLOGY ?Please see ED Course for my review and interpretation. ? ?I personally reviewed all radiographic images ordered to evaluate for the above acute complaints and reviewed radiology reports and findings.  These findings were personally discussed with the patient.  Please see medical record for radiology report. ? ? ? ?PROCEDURES: ? ?Critical Care performed:  ? ?[name].Incision and Drainage ? ?Date/Time: 12/07/2021 1:38 PM ?Performed by: Willy Eddy, MD ?Authorized by: Willy Eddy, MD  ? ?Consent:  ?  Consent obtained:  Verbal ?  Consent given by:  Patient ?  Risks discussed:  Bleeding, infection, incomplete drainage and pain ?  Alternatives discussed:  Alternative  treatment, delayed treatment and observation ?Location:  ?  Type:  Abscess ?  Size:  3 ?  Location:  Lower extremity ?  Lower extremity location:  Foot ?  Foot location:  R foot ?Pre-procedure details:  ?  Skin preparation:  Chlorhexidine with alcohol ?Anesthesia:  ?  Anesthesia method:  Local infiltration ?  Local anesthetic:  Lidocaine 1% w/o epi ?Procedure type:  ?  Complexity:  Simple ?Procedure details:  ?  Incision types:  Stab incision ?  Incision depth:  Subcutaneous ?  Wound management:  Probed and deloculated ?  Drainage:  Purulent ?  Drainage amount:  Copious ?  Packing materials:  1/2 in iodoform gauze ?  Amount 1/2" iodoform:  2 ?Post-procedure details:  ?  Procedure completion:  Tolerated well, no immediate complications ? ? ?MEDICATIONS ORDERED IN ED: ?Medications  ?cefTRIAXone (ROCEPHIN) 1 g in sodium chloride 0.9 % 100 mL IVPB (1 g Intravenous New Bag/Given 12/07/21 1343)  ?vancomycin (VANCOCIN) IVPB 1000 mg/200 mL premix (has no administration in time range)  ?  Followed by  ?vancomycin (VANCOREADY) IVPB 1500 mg/300 mL (has no administration in time range)  ?sodium chloride 0.9 % bolus 500 mL (500 mLs Intravenous New Bag/Given 12/07/21 1341)  ?lidocaine (PF) (XYLOCAINE) 1 % injection 5 mL (5 mLs Intradermal Given by Other 12/07/21 1328)  ? ? ? ?IMPRESSION / MDM / ASSESSMENT AND PLAN / ED COURSE  ?I reviewed the triage vital signs and the nursing notes. ?             ?               ?Differential diagnosis includes, but is not limited to, abscess, cellulitis, DVT, osteomyelitis, deep space infection, venous stasis, CHF, sepsis ? ?Presenting to the ER for evaluation of symptoms as described above.  Found to be hypotensive feeling weak.  Concern for sepsis blood work sent off.  Lactate normal mild leukocytosis mild hyperglycemia and renal insufficiency.  Lower suspicion for DVT.  Clinically consistent with abscess.  I&D performed as above with copious amount of purulent drainage.  Cultures obtained will  be started on broad-spectrum antibiotics given his hypotension which is improving with some IV hydration.   ? ? ?Clinical Course as of 12/07/21 1403  ?Sat Dec 07, 2021  ?1343 The patient's lactate is negative we will continue observe.  Given his history of CHF with swelling will continue observe as his lactate is negative and blood pressure is improving.  Repeat blood pressure now 110/65.  She does not have severe sepsis or shock feel that he can receive IV resuscitation as needed after further reassessments.  Patient did take an extra dose of Lasix this morning.  At this point believe he is stable and appropriate for admission for IV antibiotics and reassessment. [PR]  ?1344 Hospitalist consulted for admission. [PR]  ?  ?Clinical Course User Index ?[PR] Willy Eddy, MD  ? ? ? ?FINAL CLINICAL IMPRESSION(S) / ED DIAGNOSES  ? ?Final diagnoses:  ?Cellulitis and abscess of foot  ?Sepsis, due to unspecified organism, unspecified whether acute organ dysfunction present [facility])  ? ? ? ?Rx / DC Orders  ? ?ED Discharge Orders   ? ? None  ? ?  ? ? ? ?Note:  This document was prepared using Dragon voice recognition software and may include unintentional dictation errors. ? ?  ?Willy Eddy, MD ?12/07/21 1403 ? ?  ?Willy Eddy, MD ?12/07/21 1405 ? ?

## 2021-12-07 NOTE — Assessment & Plan Note (Addendum)
--  likely chronic.  Na 133 in 2021.  Did not improve with IVF. ?--monitor for now ?

## 2021-12-07 NOTE — Assessment & Plan Note (Addendum)
Unclear etiology.  Hepatitis panel neg.  Tylenol level wnl. ?Plan: ?--trend ?

## 2021-12-07 NOTE — Progress Notes (Signed)
Pharmacy Antibiotic Note ? ?[name] [name] is a 81 y.o. male with medical history significant for CAD, chronic systolic CHF, morbid obesity, type 2 diabetes, hyperlipidemia, hypertension, nephrolithiasis, AAA, A-fib, who presents with pain and swelling of the right foot.  Pharmacy has been consulted for vancomycin dosing for cellulitis. ? ?Plan: ?Pt to received vancomcyin 2.5 g IV x 1 loading dose. Will continue with 1250 mg IV Q24H ?Est AUC: 468.9 ?Used: Scr 1.37, IBW, Vd 0.5 ?Obtain vanc levels around 4th or 5th dose ?Monitor renal function and adjust dose as clinically indicated ? ?Height: 6' (182.9 cm) ?Weight: 118.8 kg (261 lb 14.5 oz) ?IBW/kg (Calculated) : 77.6 ? ?Temp (24hrs), Avg:97.9 ?F (36.6 ?C), Min:97.5 ?F (36.4 ?C), Max:98.6 ?F (37 ?C) ? ?Recent Labs  ?Lab 12/07/21 ?1138 12/07/21 ?1337  ?WBC 11.1*  --   ?CREATININE 1.37*  --   ?LATICACIDVEN 1.7 1.5  ?  ?Estimated Creatinine Clearance: 59.1 mL/min (A) (by C-G formula based on SCr of 1.37 mg/dL (H)).   ? ?Allergies  ?Allergen Reactions  ? Glimepiride   ?  Unusual sweating and irregular heart beat  ? ? ?Antimicrobials this admission: ?4/15 ceftriaxone x 1  ?4/15 vancomycin >>  ? ?Dose adjustments this admission: ? ? ?Microbiology results: ?4/15 BCx: sent ?4/15 Wound: sent ? ?Thank you for allowing pharmacy to be a part of this patient?s care. ? ?[name] [name] [name] [name] ?12/07/2021 3:23 PM ? ?

## 2021-12-08 ENCOUNTER — Other Ambulatory Visit: Payer: Self-pay

## 2021-12-08 ENCOUNTER — Inpatient Hospital Stay: Payer: PPO

## 2021-12-08 DIAGNOSIS — L02611 Cutaneous abscess of right foot: Secondary | ICD-10-CM | POA: Diagnosis not present

## 2021-12-08 LAB — COMPREHENSIVE METABOLIC PANEL
ALT: 136 U/L — ABNORMAL HIGH (ref 0–44)
AST: 110 U/L — ABNORMAL HIGH (ref 15–41)
Albumin: 2.9 g/dL — ABNORMAL LOW (ref 3.5–5.0)
Alkaline Phosphatase: 124 U/L (ref 38–126)
Anion gap: 8 (ref 5–15)
BUN: 25 mg/dL — ABNORMAL HIGH (ref 8–23)
CO2: 25 mmol/L (ref 22–32)
Calcium: 8.6 mg/dL — ABNORMAL LOW (ref 8.9–10.3)
Chloride: 98 mmol/L (ref 98–111)
Creatinine, Ser: 1.4 mg/dL — ABNORMAL HIGH (ref 0.61–1.24)
GFR, Estimated: 51 mL/min — ABNORMAL LOW (ref >60)
Glucose, Bld: 120 mg/dL — ABNORMAL HIGH (ref 70–99)
Potassium: 3.7 mmol/L (ref 3.5–5.1)
Sodium: 131 mmol/L — ABNORMAL LOW (ref 135–145)
Total Bilirubin: 1.3 mg/dL — ABNORMAL HIGH (ref 0.3–1.2)
Total Protein: 6.6 g/dL (ref 6.5–8.1)

## 2021-12-08 LAB — GLUCOSE, CAPILLARY
Glucose-Capillary: 109 mg/dL — ABNORMAL HIGH (ref 70–99)
Glucose-Capillary: 122 mg/dL — ABNORMAL HIGH (ref 70–99)
Glucose-Capillary: 143 mg/dL — ABNORMAL HIGH (ref 70–99)
Glucose-Capillary: 161 mg/dL — ABNORMAL HIGH (ref 70–99)
Glucose-Capillary: 197 mg/dL — ABNORMAL HIGH (ref 70–99)

## 2021-12-08 LAB — HEPATITIS PANEL, ACUTE
HCV Ab: NONREACTIVE
Hep A IgM: NONREACTIVE
Hep B C IgM: NONREACTIVE
Hepatitis B Surface Ag: NONREACTIVE

## 2021-12-08 LAB — HEMOGLOBIN A1C
Hgb A1c MFr Bld: 7 % — ABNORMAL HIGH (ref 4.8–5.6)
Mean Plasma Glucose: 154.2 mg/dL

## 2021-12-08 LAB — CBC
HCT: 37.1 % — ABNORMAL LOW (ref 39.0–52.0)
Hemoglobin: 11.9 g/dL — ABNORMAL LOW (ref 13.0–17.0)
MCH: 27.2 pg (ref 26.0–34.0)
MCHC: 32.1 g/dL (ref 30.0–36.0)
MCV: 84.7 fL (ref 80.0–100.0)
Platelets: 275 10*3/uL (ref 150–400)
RBC: 4.38 MIL/uL (ref 4.22–5.81)
RDW: 13.6 % (ref 11.5–15.5)
WBC: 9.8 10*3/uL (ref 4.0–10.5)
nRBC: 0 % (ref 0.0–0.2)

## 2021-12-08 LAB — MRSA NEXT GEN BY PCR, NASAL: MRSA by PCR Next Gen: NOT DETECTED

## 2021-12-08 IMAGING — MR MR FOOT*R* WO/W CM
10 series · 40 of 40 positions shown · IV contrast (Contrast agent)
Comparison: Radiograph [DATE].

CLINICAL DATA: Foot swelling, diabetic, osteomyelitis suspected,
xray done osteomyelitis

EXAM:
MRI OF THE RIGHT FOREFOOT WITHOUT AND WITH CONTRAST
TECHNIQUE: Multiplanar, multisequence MR imaging of the right forefoot was
performed before and after the administration of intravenous
contrast.
CONTRAST:  10mL GADAVIST GADOBUTROL 1 MMOL/ML IV SOLN

[Series 4: T1 · coronal · right · 3.0mm · 0.38mm/px · 4 of 45 slices shown (1 of 2)]
[im 1/45]
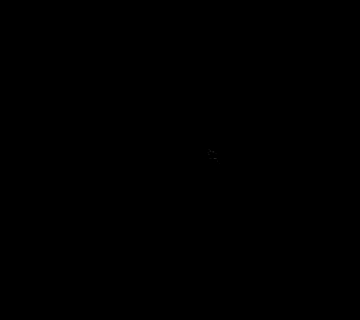
[im 15/45]
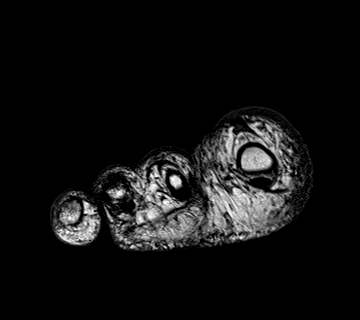
[im 30/45]
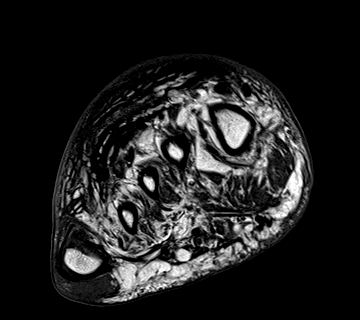
[im 45/45]
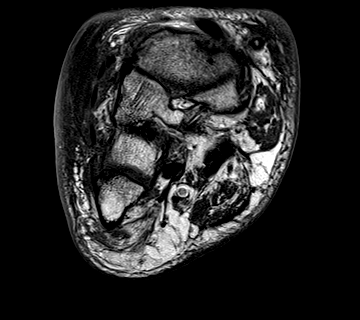

[Series 5: T2 · coronal · right · 3.0mm · 0.38mm/px · 4 of 45 slices shown (1 of 3)]
[im 1/45]
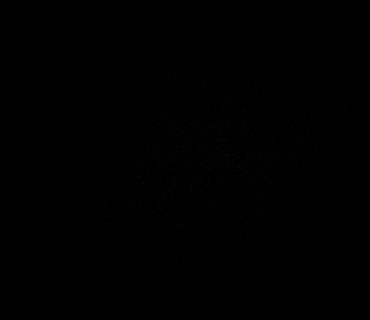
[im 15/45]
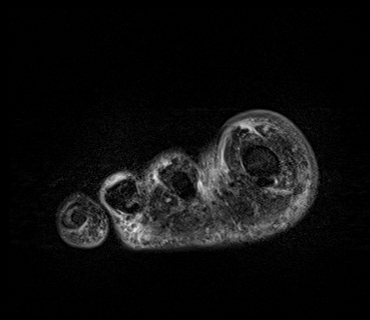
[im 30/45]
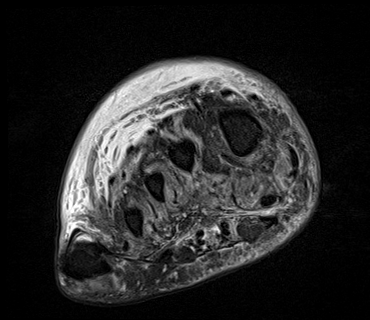
[im 45/45]
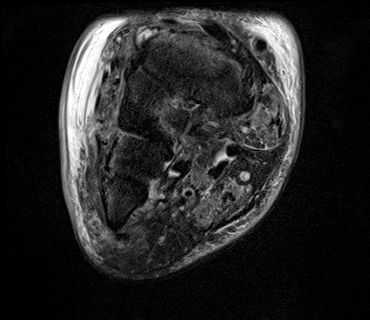

[Series 6: T2 · coronal · right · 3.0mm · 0.38mm/px · 5 of 45 slices shown (2 of 3)]
[im 1/45]
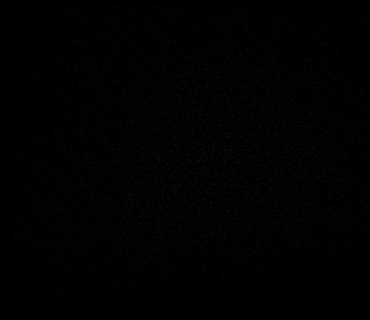
[im 12/45]
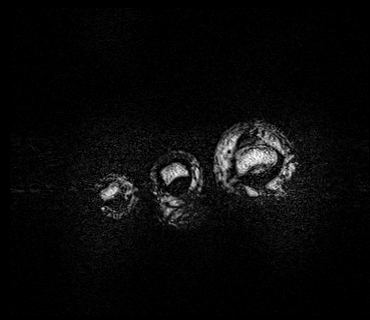
[im 23/45]
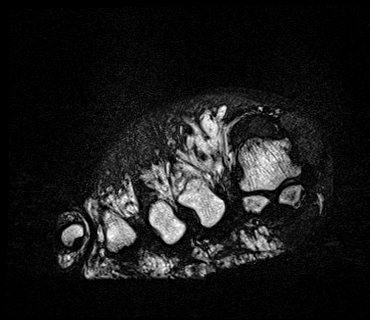
[im 34/45]
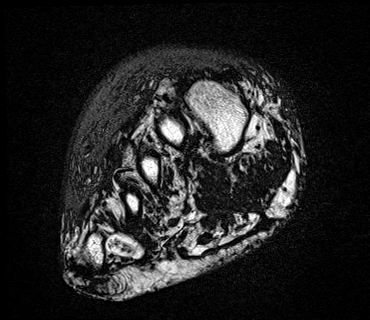
[im 45/45]
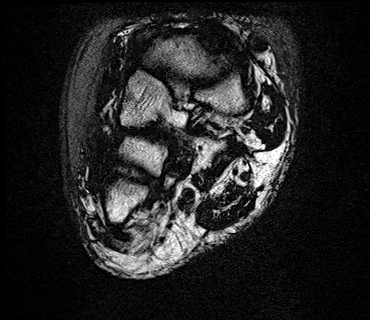

[Series 7: T1 · axial · right · 3.0mm · 0.70mm/px · z∈[-140,-53]mm · 3 of 25 slices shown (2 of 2)]
[im 1/25]
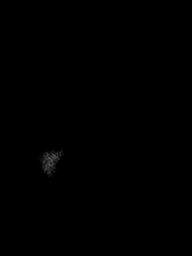
[im 13/25]
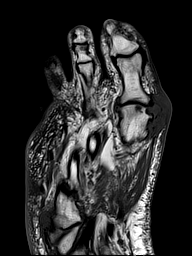
[im 25/25]
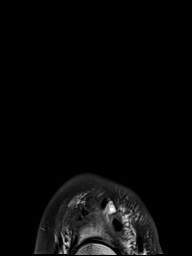

[Series 9: T2 · axial · right · 3.0mm · 0.70mm/px · z∈[-140,-53]mm · 3 of 25 slices shown (3 of 3)]
[im 1/25]
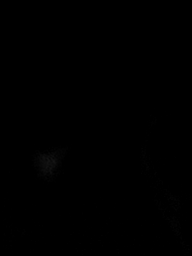
[im 13/25]
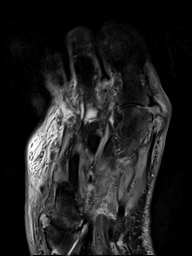
[im 25/25]
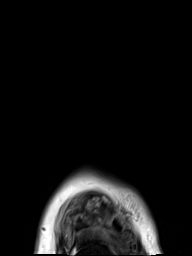

[Series 10: STIR · sagittal · right · 3.0mm · 0.62mm/px · 4 of 34 slices shown]
[im 1/34]
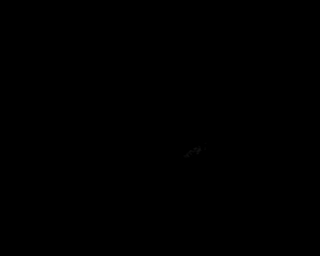
[im 12/34]
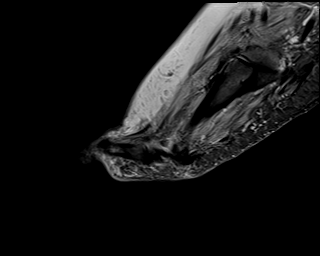
[im 23/34]
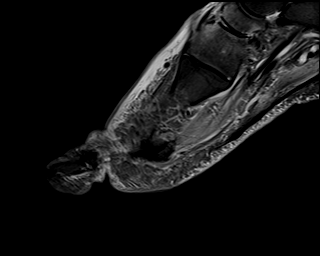
[im 34/34]
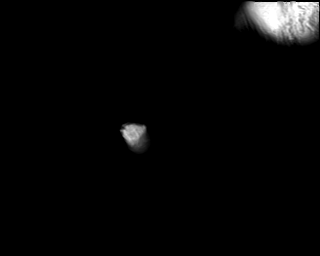

[Series 11: T1 fat-sat · coronal · non-contrast · right · 3.0mm · 0.47mm/px · 5 of 45 slices shown (1 of 3)]
[im 1/45]
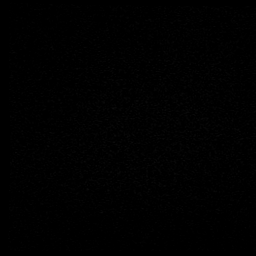
[im 12/45]
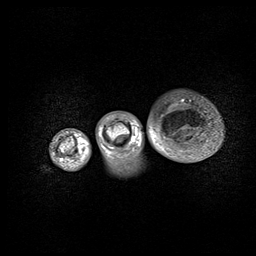
[im 23/45]
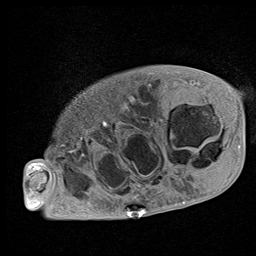
[im 34/45]
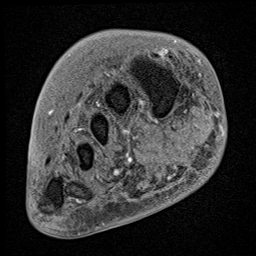
[im 45/45]
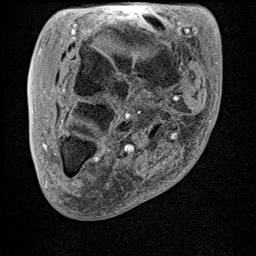

[Series 12: T1 fat-sat post-contrast · coronal · right · 3.0mm · 0.47mm/px · 5 of 45 slices shown]
[im 1/45]
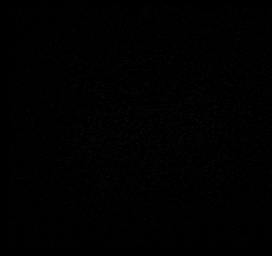
[im 12/45]
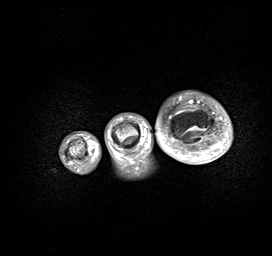
[im 23/45]
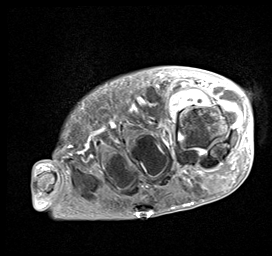
[im 34/45]
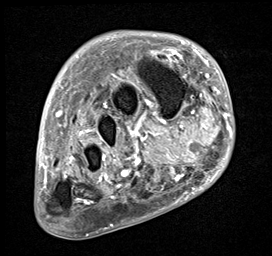
[im 45/45]
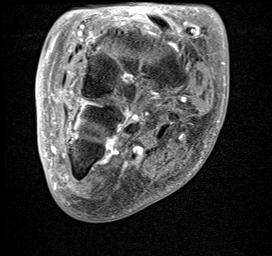

[Series 13: T1 fat-sat · sagittal · right · 3.0mm · 0.62mm/px · 4 of 34 slices shown (2 of 3)]
[im 1/34]
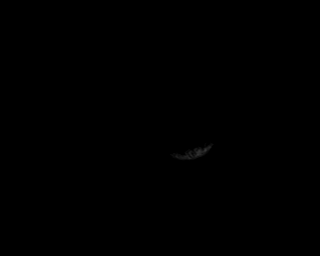
[im 12/34]
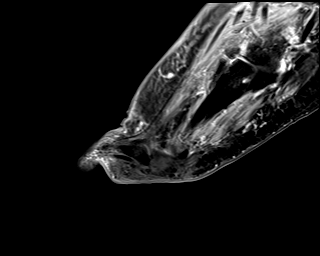
[im 23/34]
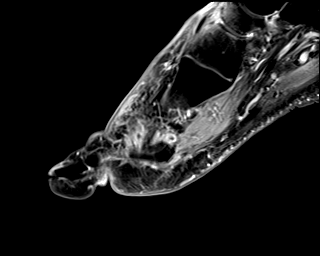
[im 34/34]
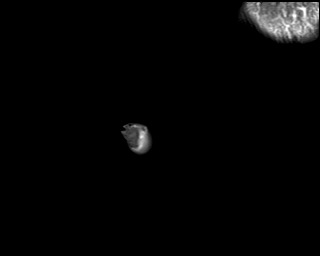

[Series 14: T1 fat-sat · axial · right · 3.0mm · 0.56mm/px · z∈[-140,-53]mm · 3 of 25 slices shown (3 of 3)]
[im 1/25]
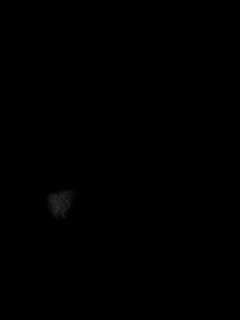
[im 13/25]
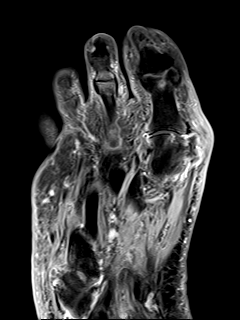
[im 25/25]
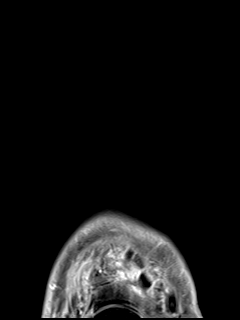

[40 of 40 positions shown; findings below may reference images not displayed]

FINDINGS: Bones/Joint/Cartilage

There is in erosion of the medial aspect of the first metatarsal
head. Adjacent underlying mild marrow edema and enhancement.
Relatively preserved underlying T1 marrow signal. There is a first
MTP joint effusion with synovial enhancement.

Ligaments

Intact Lisfranc ligament.

Muscles and Tendons

Diffuse intramuscular edema muscle atrophy in the foot as is
commonly seen in diabetics.

Soft tissues

There is a soft tissue ulcer along the medial forefoot with adjacent
rim enhancing fluid at the medial aspect of the first metatarsal
head. This collection of fluid measures approximately 2.9 x 2.1 x
2.2 cm (coronal postcontrast image 11, axial postcontrast image
23). There is a first MTP joint effusion.
IMPRESSION: Soft tissue ulcer along the medial forefoot at the level of first
metatarsal head, with underlying rim enhancing fluid collection
measuring 2.9 x 2.1 x 2.2 cm, which appears to be communicating with
the skin surface, correlate with drainage. Adjacent first MTP joint
effusion with synovial enhancement, erosion of the medial first
metatarsal head and underlying marrow edema concerning for septic
arthritis and early osteomyelitis at the first MTP joint.

## 2021-12-08 MED ORDER — GADOBUTROL 1 MMOL/ML IV SOLN
10.0000 mL | Freq: Once | INTRAVENOUS | Status: AC | PRN
  Administered 2021-12-08: 10 mL via INTRAVENOUS

## 2021-12-08 MED ORDER — SODIUM CHLORIDE 0.9% FLUSH
10.0000 mL | Freq: Two times a day (BID) | INTRAVENOUS | Status: DC
  Administered 2021-12-08 – 2021-12-10 (×4): 10 mL via INTRAVENOUS

## 2021-12-08 MED ORDER — SODIUM CHLORIDE 0.9 % IV SOLN
1.0000 g | INTRAVENOUS | Status: DC
  Administered 2021-12-08: 1 g via INTRAVENOUS
  Filled 2021-12-08 (×3): qty 10

## 2021-12-08 MED ORDER — FUROSEMIDE 10 MG/ML IJ SOLN
40.0000 mg | Freq: Once | INTRAMUSCULAR | Status: AC
  Administered 2021-12-08: 40 mg via INTRAVENOUS
  Filled 2021-12-08: qty 4

## 2021-12-08 NOTE — Progress Notes (Signed)
?  Progress Note ? ? ?Patient: [name] [name] JXB:[id number] DOB: [birth year] DOA: 12/07/2021     1 ?DOS: the patient was seen and examined on 12/08/2021 ?  ?Brief hospital course: ?No notes on file ? ?Assessment and Plan: ?* Abscess of right foot ?Status post I&D of large abscess in the ED.   ?--received vanc and ceftriaxone in the ED ?Plan: ?--podiatry consult ?--MRI right foot ?--cont vanc and ceftriaxone ? ?Chronic hyponatremia ?--likely chronic.  Na 133 in 2021.  Did not improve with IVF. ?--monitor for now ? ?Transaminitis ?Unclear etiology.  Hepatitis panel neg.  Tylenol level wnl. ?Plan: ?--trend ? ?Type 2 diabetes mellitus with diabetic polyneuropathy, without long-term current use of insulin (HCC) ?--Well controlled.  A1c 7.0. ?--no need for BG checks and SSI ? ?Chronic systolic CHF (congestive heart failure) (HCC) ?--cont home coreg and lasix ? ?Morbid obesity (HCC) ?--BMI 35.52 with a serious health condition of diabetes ? ? ? ? ?  ? ?Subjective:  ?Reported worsening swelling in BLE.   ? ? ?Physical Exam: ? ?Constitutional: NAD, AAOx3 ?HEENT: conjunctivae and lids normal, EOMI ?CV: No cyanosis.   ?RESP: normal respiratory effort, on RA ?Extremities: edema in BLE.  Right foot wrapped.  Left foot edematous with erythema in 2nd toe. ?SKIN: warm, dry ?Neuro: II - XII grossly intact.   ?Psych: Normal mood and affect.  Appropriate judgement and reason ? ? ?Data Reviewed: ? ?Family Communication:  ? ?Disposition: ?Status is: Inpatient ? ? Planned Discharge Destination: Home ? ? ? ?Time spent: 50 minutes ? ?Author: ?Darlin Priestly, MD ?12/08/2021 2:25 PM ? ?For on call review [email or web address].  ?

## 2021-12-08 NOTE — Assessment & Plan Note (Signed)
--  BMI 35.52 with a serious health condition of diabetes ?

## 2021-12-08 NOTE — Assessment & Plan Note (Addendum)
--  cont home coreg  ?

## 2021-12-08 NOTE — Consult Note (Addendum)
PODIATRY / FOOT AND ANKLE SURGERY CONSULTATION NOTE ? ?Requesting Physician: Dr. Fran Lowes ? ?Reason for consult: Right foot infection ? ?Chief Complaint: Right first metatarsal phalangeal joint infection/abscess ?  ?HPI: [name] [name] is a 81 y.o. male who presents with concerns for infection to the right first metatarsal phalangeal joint area.  He reports progressive swelling, pain, and erythema to the first metatarsal phalangeal joint of the right foot.  He recently saw Dr. Alberteen Spindle a couple weeks ago and was treated for gout to this area.  He was going to try to lance this area himself as he noticed fluid buildup to this area but thought it was a bad idea so came to the hospital instead.  Patient had an I&D performed in the emergency room by emergency room physician which released a large amount of purulent drainage from the area.  Patient presents today resting in bed comfortably and admitted for infection to the right first metatarsal phalangeal joint area.  Patient currently denies nausea, vomiting, fevers, chills.  Patient's most recent hemoglobin A1c was 7%. ? ?PMHx:  ?Past Medical History:  ?Diagnosis Date  ? AAA (abdominal aortic aneurysm) (HCC)   ? ACE inhibitor associated hyperkalemia 09/20/2015  ? Arthritis   ? CAD (coronary artery disease)   ? a. 03/2014 NSTEMI--> 05/2014 CABG x 5 (LIMA->D1, VG->LAD, VG->OM1, VG->OM2, VG->RPDA);  c. 04/2015 Cath: LM nl, LAD 100ost, 40d, RI 80, LCX 80p/m, OM1 70, OM2 80, RCA 20p, 70m, 90d, VG->dLAD min irregs, VG->OM1 100, VG->OM2 100, VG->RPDA nl, LIMA->D2 nl.  ? Chronic systolic CHF (congestive heart failure) (HCC)   ? a. EF 25-30% by 2D ECHO 04/20/14. Severely dec global hypokinesis. mildly elevated RVSP;  b. 12/2014 Echo: EF 40-45%, mod dil LA, mildly dil RV/RA;  c. 04/2015 Echo: EF 30-35%, sev antsept HK, Gr1 DD, mildly dil RA/LA;  d. 07/2015 Echo: EF 35-40%, ant/antsept HK, Gr1 DD, mildly red RV fxn.  ? Diabetes mellitus without complication (HCC)   ? Epigastric hernia   ?  Frequent PVCs   ? a. 24 hr Holter 06/2015: >28K PVCs accounting for 25% of all beats, rare PAC-->mexilitene started;  b. 07/2015 Holter: 1700 PVC's/24 hrs.  ? GERD (gastroesophageal reflux disease)   ? Gout   ? Hyperlipidemia   ? Hypertension   ? Ischemic cardiomyopathy   ? a. EF 25-30% 03/2014;  b. EF 40-45% 12/2014.  ? Mitral regurgitation   ? a. 03/2014 TEE: mild to mod MR.  ? Nephrolithiasis   ? Neuromuscular disorder (HCC)   ? DIABETIC NEUROPATHY  ? Obesity   ? Umbilical hernia   ? ? ?Surgical Hx:  ?Past Surgical History:  ?Procedure Laterality Date  ? CARDIAC CATHETERIZATION    ? [facility]  ? CARDIAC CATHETERIZATION N/A 05/23/2015  ? Procedure: Left Heart Cath and Coronary Angiography;  Surgeon: Iran Ouch, MD;  [facility]: [facility];  Service: Cardiovascular;  Laterality: N/A;  ? CARDIOVERSION N/A 01/30/2020  ? Procedure: CARDIOVERSION;  Surgeon: Iran Ouch, MD;  Location: [facility] ORS;  Service: Cardiovascular;  Laterality: N/A;  ? CARDIOVERSION N/A 08/09/2020  ? Procedure: CARDIOVERSION;  Surgeon: Dalia Heading, MD;  Location: [facility] ORS;  Service: Cardiovascular;  Laterality: N/A;  ? CARPAL TUNNEL RELEASE Right   ? CORONARY ARTERY BYPASS GRAFT N/A 06/06/2014  ? Procedure: CORONARY ARTERY BYPASS GRAFTING (CABG) x 5 USING LEFT AND RIGHT SAPHENOUS LEG VEIN HARVESTED ENDOSCOPICALLY;  Surgeon: Kerin Perna, MD;  Location: [facility] OR;  Service: Open Heart Surgery;  Laterality: N/A;  ? ESOPHAGOGASTRODUODENOSCOPY (EGD) WITH PROPOFOL N/A 02/11/2016  ? Procedure: ESOPHAGOGASTRODUODENOSCOPY (EGD) WITH PROPOFOL with dialation;  Surgeon: [name], MD;  Location: [facility];  Service: Endoscopy;  Laterality: N/A;  diabetic, oral meds  ? INTRAOPERATIVE TRANSESOPHAGEAL ECHOCARDIOGRAM N/A 06/06/2014  ? Procedure: INTRAOPERATIVE TRANSESOPHAGEAL ECHOCARDIOGRAM;  Surgeon: Kerin Perna, MD;  Location: [facility] OR;  Service: Open Heart Surgery;  Laterality: N/A;  ? ROTATOR CUFF REPAIR Right 1996,1997,2000  ? TEE  WITHOUT CARDIOVERSION N/A 04/24/2014  ? Procedure: TRANSESOPHAGEAL ECHOCARDIOGRAM (TEE);  Surgeon: [name], MD;  Location: [facility] ENDOSCOPY;  Service: Cardiovascular;  Laterality: N/A;  ? ? ?FHx:  ?Family History  ?Problem Relation Age of Onset  ? Throat cancer Father   ? Stroke Father   ? Colon polyps Brother   ? Lung cancer Brother   ? Prostate cancer Neg Hx   ? Bladder Cancer Neg Hx   ? Kidney cancer Neg Hx   ? ? ?Social History:  reports that he has quit smoking. His smoking use included cigarettes. He has a 25.00 pack-year smoking history. He has quit using smokeless tobacco.  His smokeless tobacco use included chew. He reports that he does not drink alcohol and does not use drugs. ? ?Allergies:  ?Allergies  ?Allergen Reactions  ? Glimepiride   ?  Unusual sweating and irregular heart beat  ? ? ?Medications Prior to Admission  ?Medication Sig Dispense Refill  ? acetaminophen (TYLENOL) 500 MG tablet Take 500-1,000 mg by mouth every 6 (six) hours as needed for moderate pain.    ? apixaban (ELIQUIS) 5 MG TABS tablet Take 1 tablet (5 mg total) by mouth 2 (two) times daily. 360 tablet 0  ? atorvastatin (LIPITOR) 40 MG tablet TAKE 1 TABLET BY MOUTH ONCE DAILY AT 6 PM. 90 tablet 0  ? carvedilol (COREG) 12.5 MG tablet TAKE 1 TABLET BY MOUTH TWICE DAILY WITH A MEAL 180 tablet 0  ? gabapentin (NEURONTIN) 300 MG capsule Take 300 mg by mouth 4 (four) times daily.     ? mexiletine (MEXITIL) 200 MG capsule Take 200 mg by mouth 4 (four) times daily.    ? valsartan (DIOVAN) 40 MG tablet Take 40 mg by mouth daily.    ? clotrimazole-betamethasone (LOTRISONE) cream Apply 1 application topically 2 (two) times daily as needed (skin irritation/breakouts).  (Patient not taking: Reported on 12/07/2021)    ? colchicine 0.6 MG tablet Take 0.6 mg by mouth daily as needed (for gout flareup).  (Patient not taking: Reported on 12/07/2021)    ? collagenase (SANTYL) ointment Apply topically daily. (Patient not taking: Reported on 08/01/2020)  15 g 0  ? diphenhydramine-acetaminophen (TYLENOL PM) 25-500 MG TABS tablet Take 1 tablet by mouth at bedtime as needed (sleep).    ? Elastic Bandages & Supports (BANDAGE ROLL 4.5"X4YD) MISC 1 units of lipase by Does not apply route daily. 10 each 0  ? furosemide (LASIX) 40 MG tablet Take 1 tablet by mouth once daily (Patient not taking: Reported on 12/07/2021) 90 tablet 3  ? mexiletine (MEXITIL) 150 MG capsule Take 300 mg by mouth 2 (two) times daily. (Patient not taking: Reported on 12/07/2021)    ? nitroGLYCERIN (NITROSTAT) 0.4 MG SL tablet Place 1 tablet (0.4 mg total) under the tongue every 5 (five) minutes as needed for chest pain. 25 tablet 3  ? povidone-iodine (BETADINE) 10 % external solution Apply 1 application topically as needed for wound care. (Patient taking differently: Apply 1 application. topically daily.) 480  mL 0  ? sacubitril-valsartan (ENTRESTO) 24-26 MG Take 1 tablet by mouth 2 (two) times daily. (Patient not taking: Reported on 08/01/2020) 60 tablet 0  ? SSD 1 % cream Apply 1 application topically daily.  (Patient not taking: Reported on 12/07/2021)    ? ? ?Physical Exam: ?General: Alert and oriented.  No apparent distress. ? ?Vascular: DP/PT pulses +1 bilateral, mild to moderate nonpitting edema present to bilateral lower extremities.  Erythema present to the right first metatarsal phalangeal joint and to a lesser extent the dorsal forefoot left side.  No hair growth noted to digits bilaterally. ? ?Neuro: Light touch sensation reduced to bilateral lower extremities. ? ?Derm: Open wound present to the right dorsal medial first metatarsal phalangeal joint with purulent drainage, appears to also potentially have some gout type crystals present to the area coming out of the wound as well as purulent discharge, wound does probe to bone at this level, wound in total measures approximately 1.5 cm x 0.2 cm x 1 cm, hard bone palpable through the central area of the ulceration, associated erythema and edema  present to the spot.  No other open wounds present to bilateral lower extremities. ? ? ? ? ?MSK: Right foot pain first metatarsal phalangeal joint ? ?Results for orders placed or performed during the hos

## 2021-12-08 NOTE — Plan of Care (Signed)
?  Problem: Clinical Measurements: ?Goal: Ability to avoid or minimize complications of infection will improve ?Outcome: Progressing ?  ?Problem: Skin Integrity: ?Goal: Skin integrity will improve ?Outcome: Progressing ?  ?

## 2021-12-08 NOTE — Assessment & Plan Note (Signed)
--  Well controlled.  A1c 7.0. ?--no need for BG checks and SSI ?

## 2021-12-09 ENCOUNTER — Inpatient Hospital Stay: Payer: PPO | Admitting: Anesthesiology

## 2021-12-09 ENCOUNTER — Inpatient Hospital Stay: Payer: PPO

## 2021-12-09 ENCOUNTER — Other Ambulatory Visit: Payer: Self-pay

## 2021-12-09 ENCOUNTER — Encounter
Admission: EM | Disposition: A | Discharge status: Home Health | Payer: Self-pay | Source: Home / Self Care | Attending: Hospitalist

## 2021-12-09 ENCOUNTER — Encounter: Payer: Self-pay | Admitting: Anesthesiology

## 2021-12-09 DIAGNOSIS — L02611 Cutaneous abscess of right foot: Secondary | ICD-10-CM | POA: Diagnosis not present

## 2021-12-09 HISTORY — PX: AMPUTATION: SHX166

## 2021-12-09 HISTORY — PX: INCISION AND DRAINAGE: SHX5863

## 2021-12-09 LAB — MAGNESIUM: Magnesium: 2 mg/dL (ref 1.7–2.4)

## 2021-12-09 LAB — CBC
HCT: 37.3 % — ABNORMAL LOW (ref 39.0–52.0)
Hemoglobin: 11.8 g/dL — ABNORMAL LOW (ref 13.0–17.0)
MCH: 27.3 pg (ref 26.0–34.0)
MCHC: 31.6 g/dL (ref 30.0–36.0)
MCV: 86.1 fL (ref 80.0–100.0)
Platelets: 305 10*3/uL (ref 150–400)
RBC: 4.33 MIL/uL (ref 4.22–5.81)
RDW: 13.7 % (ref 11.5–15.5)
WBC: 11.4 10*3/uL — ABNORMAL HIGH (ref 4.0–10.5)
nRBC: 0 % (ref 0.0–0.2)

## 2021-12-09 LAB — BASIC METABOLIC PANEL
Anion gap: 9 (ref 5–15)
BUN: 27 mg/dL — ABNORMAL HIGH (ref 8–23)
CO2: 27 mmol/L (ref 22–32)
Calcium: 8.6 mg/dL — ABNORMAL LOW (ref 8.9–10.3)
Chloride: 95 mmol/L — ABNORMAL LOW (ref 98–111)
Creatinine, Ser: 1.44 mg/dL — ABNORMAL HIGH (ref 0.61–1.24)
GFR, Estimated: 50 mL/min — ABNORMAL LOW (ref >60)
Glucose, Bld: 155 mg/dL — ABNORMAL HIGH (ref 70–99)
Potassium: 3.7 mmol/L (ref 3.5–5.1)
Sodium: 131 mmol/L — ABNORMAL LOW (ref 135–145)

## 2021-12-09 LAB — GLUCOSE, CAPILLARY
Glucose-Capillary: 137 mg/dL — ABNORMAL HIGH (ref 70–99)
Glucose-Capillary: 114 mg/dL — ABNORMAL HIGH (ref 70–99)
Glucose-Capillary: 110 mg/dL — ABNORMAL HIGH (ref 70–99)
Glucose-Capillary: 126 mg/dL — ABNORMAL HIGH (ref 70–99)

## 2021-12-09 IMAGING — DX DG FOOT 2V*R*
2 series · 2 of 2 positions shown · non-contrast
Comparison: None.

CLINICAL DATA: [0M]. Post op check, right foot xrays in pacu
Right foot wrapped

EXAM:
RIGHT FOOT - 2 VIEW

[foot ap]
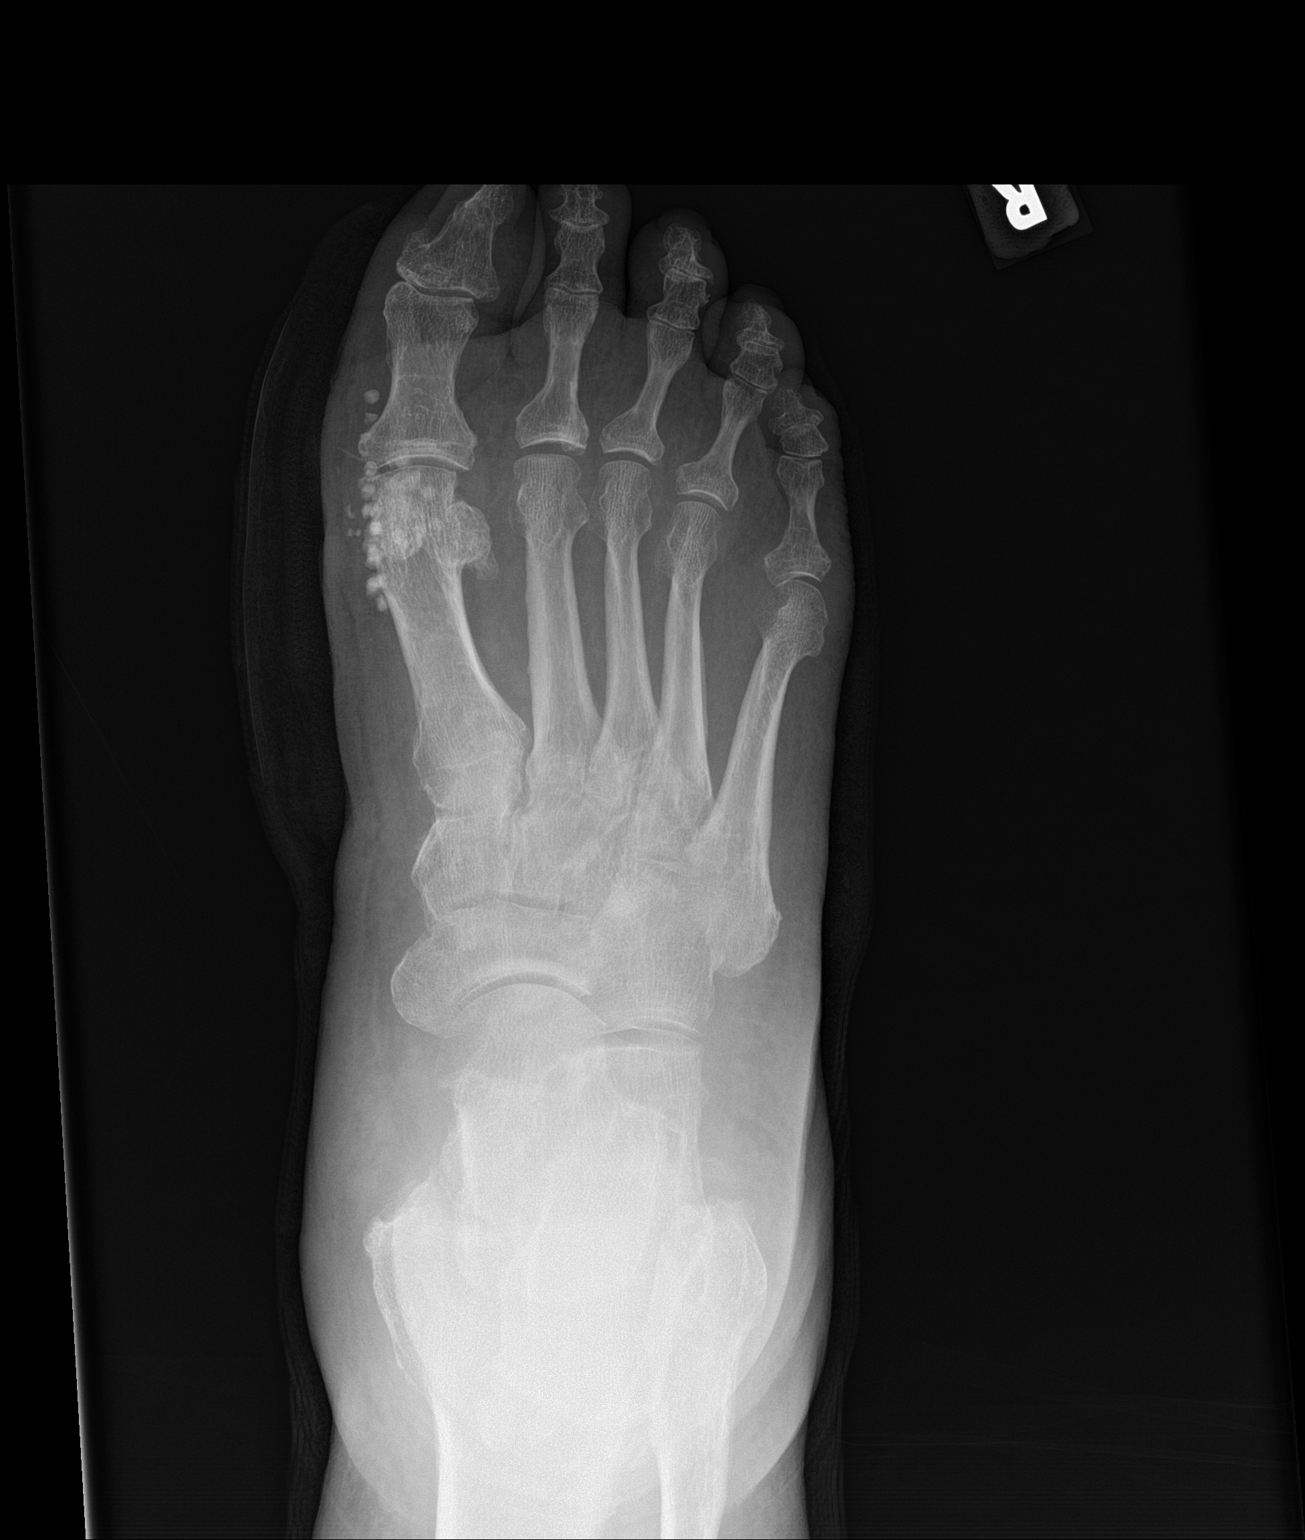

[foot lat]
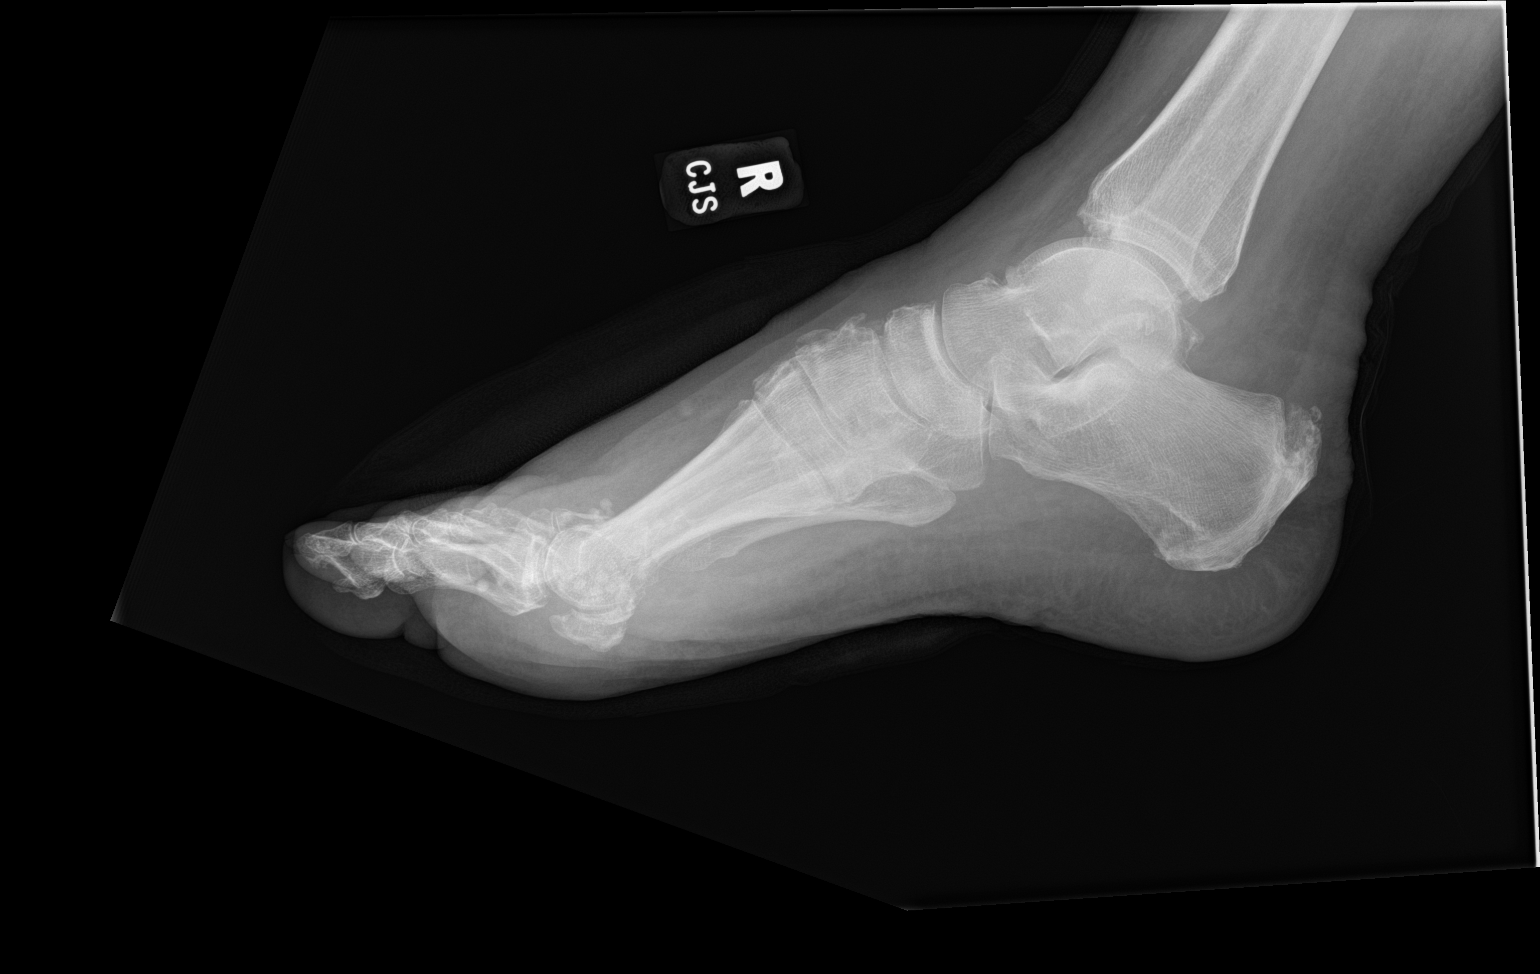

[2 of 2 positions shown; findings below may reference images not displayed]

FINDINGS: Rounded 2-3 mm densities overlie the first digit surgical site
likely related to wrapping versus antibiotic beads. Postsurgical
changes along the first metatarsal head. There is no evidence of
fracture or dislocation. Posterior calcaneal spur. Midfoot
degenerative changes. Subcutaneus soft tissue edema.
IMPRESSION: First digit metatarsal head surgical changes.

## 2021-12-09 SURGERY — INCISION AND DRAINAGE
Anesthesia: General | Laterality: Right

## 2021-12-09 MED ORDER — LIDOCAINE HCL (PF) 1 % IJ SOLN
INTRAMUSCULAR | Status: DC | PRN
Start: 2021-12-09 — End: 2021-12-09
  Administered 2021-12-09: 20 mL

## 2021-12-09 MED ORDER — PROPOFOL 500 MG/50ML IV EMUL
INTRAVENOUS | Status: AC
  Filled 2021-12-09: qty 50

## 2021-12-09 MED ORDER — PROPOFOL 10 MG/ML IV BOLUS
INTRAVENOUS | Status: AC
  Filled 2021-12-09: qty 20

## 2021-12-09 MED ORDER — PROPOFOL 500 MG/50ML IV EMUL
INTRAVENOUS | Status: DC | PRN
  Administered 2021-12-09: 120 ug/kg/min via INTRAVENOUS

## 2021-12-09 MED ORDER — ACETAMINOPHEN 10 MG/ML IV SOLN: 1000.0000 mg | Freq: Once | INTRAVENOUS | Status: DC | PRN

## 2021-12-09 MED ORDER — SODIUM CHLORIDE 0.9 % IV SOLN: INTRAVENOUS | Status: DC | PRN

## 2021-12-09 MED ORDER — VANCOMYCIN HCL 1000 MG IV SOLR
INTRAVENOUS | Status: AC
  Filled 2021-12-09: qty 20

## 2021-12-09 MED ORDER — DEXMEDETOMIDINE (PRECEDEX) IN NS 20 MCG/5ML (4 MCG/ML) IV SYRINGE
PREFILLED_SYRINGE | INTRAVENOUS | Status: DC | PRN
  Administered 2021-12-09: 12 ug via INTRAVENOUS
  Administered 2021-12-09: 8 ug via INTRAVENOUS

## 2021-12-09 MED ORDER — VANCOMYCIN HCL 1000 MG IV SOLR
INTRAVENOUS | Status: DC | PRN
  Administered 2021-12-09: 1000 mg

## 2021-12-09 MED ORDER — ONDANSETRON HCL 4 MG/2ML IJ SOLN: 4.0000 mg | Freq: Once | INTRAMUSCULAR | Status: DC | PRN

## 2021-12-09 MED ORDER — BUPIVACAINE HCL (PF) 0.5 % IJ SOLN
INTRAMUSCULAR | Status: AC
  Filled 2021-12-09: qty 30

## 2021-12-09 MED ORDER — PHENYLEPHRINE HCL (PRESSORS) 10 MG/ML IV SOLN
INTRAVENOUS | Status: DC | PRN
  Administered 2021-12-09 (×2): 160 ug via INTRAVENOUS
  Administered 2021-12-09: 80 ug via INTRAVENOUS
  Administered 2021-12-09 (×5): 160 ug via INTRAVENOUS

## 2021-12-09 MED ORDER — EPHEDRINE SULFATE (PRESSORS) 50 MG/ML IJ SOLN
INTRAMUSCULAR | Status: DC | PRN
  Administered 2021-12-09 (×3): 5 mg via INTRAVENOUS
  Administered 2021-12-09: 10 mg via INTRAVENOUS

## 2021-12-09 MED ORDER — FENTANYL CITRATE (PF) 100 MCG/2ML IJ SOLN
INTRAMUSCULAR | Status: AC
  Filled 2021-12-09: qty 2

## 2021-12-09 MED ORDER — LACTATED RINGERS IV SOLN: INTRAVENOUS | Status: DC | PRN

## 2021-12-09 MED ORDER — LACTATED RINGERS IV SOLN: INTRAVENOUS | Status: DC

## 2021-12-09 MED ORDER — OXYCODONE HCL 5 MG PO TABS: 5.0000 mg | ORAL_TABLET | Freq: Once | ORAL | Status: DC | PRN

## 2021-12-09 MED ORDER — LIDOCAINE HCL (PF) 1 % IJ SOLN
INTRAMUSCULAR | Status: AC
  Filled 2021-12-09: qty 30

## 2021-12-09 MED ORDER — OXYCODONE HCL 5 MG/5ML PO SOLN: 5.0000 mg | Freq: Once | ORAL | Status: DC | PRN

## 2021-12-09 MED ORDER — FENTANYL CITRATE (PF) 100 MCG/2ML IJ SOLN
INTRAMUSCULAR | Status: DC | PRN
Start: 2021-12-09 — End: 2021-12-09
  Administered 2021-12-09 (×2): 50 ug via INTRAVENOUS

## 2021-12-09 MED ORDER — FENTANYL CITRATE (PF) 100 MCG/2ML IJ SOLN: 25.0000 ug | INTRAMUSCULAR | Status: DC | PRN

## 2021-12-09 SURGICAL SUPPLY — 69 items
BAG COUNTER SPONGE SURGICOUNT (BAG) ×3 IMPLANT
BAG SPNG CNTER NS LX DISP (BAG) ×1
BLADE MED AGGRESSIVE (BLADE) ×3 IMPLANT
BLADE OSC/SAGITTAL MD 5.5X18 (BLADE) IMPLANT
BLADE OSCILLATING/SAGITTAL (BLADE)
BLADE SURG 15 STRL LF DISP TIS (BLADE) IMPLANT
BLADE SURG 15 STRL SS (BLADE)
BLADE SURG MINI STRL (BLADE) IMPLANT
BLADE SW THK.38XMED LNG THN (BLADE) IMPLANT
BNDG CMPR STD VLCR NS LF 5.8X3 (GAUZE/BANDAGES/DRESSINGS)
BNDG CMPR STD VLCR NS LF 5.8X4 (GAUZE/BANDAGES/DRESSINGS)
BNDG CONFORM 2 STRL LF (GAUZE/BANDAGES/DRESSINGS) IMPLANT
BNDG CONFORM 3 STRL LF (GAUZE/BANDAGES/DRESSINGS) IMPLANT
BNDG ELASTIC 3X5.8 VLCR NS LF (GAUZE/BANDAGES/DRESSINGS) IMPLANT
BNDG ELASTIC 4X5.8 VLCR NS LF (GAUZE/BANDAGES/DRESSINGS) IMPLANT
BNDG ELASTIC 4X5.8 VLCR STR LF (GAUZE/BANDAGES/DRESSINGS) ×3 IMPLANT
BNDG ESMARK 4X12 TAN STRL LF (GAUZE/BANDAGES/DRESSINGS) ×3 IMPLANT
BNDG GAUZE ELAST 4 BULKY (GAUZE/BANDAGES/DRESSINGS) ×3 IMPLANT
CNTNR SPEC 2.5X3XGRAD LEK (MISCELLANEOUS) ×2
CONT SPEC 4OZ STER OR WHT (MISCELLANEOUS) ×2
CONT SPEC 4OZ STRL OR WHT (MISCELLANEOUS) ×2
CONTAINER SPEC 2.5X3XGRAD LEK (MISCELLANEOUS) ×2 IMPLANT
CUFF TOURN SGL QUICK 12 (TOURNIQUET CUFF) IMPLANT
CUFF TOURN SGL QUICK 18X4 (TOURNIQUET CUFF) IMPLANT
DRAPE FLUOR MINI C-ARM 54X84 (DRAPES) IMPLANT
DURAPREP 26ML APPLICATOR (WOUND CARE) ×2 IMPLANT
ELECT REM PT RETURN 9FT ADLT (ELECTROSURGICAL) ×2
ELECTRODE REM PT RTRN 9FT ADLT (ELECTROSURGICAL) ×2 IMPLANT
GAUZE PACKING 1/4 X5 YD (GAUZE/BANDAGES/DRESSINGS) IMPLANT
GAUZE PACKING IODOFORM 1X5 (PACKING) IMPLANT
GAUZE SPONGE 4X4 12PLY STRL (GAUZE/BANDAGES/DRESSINGS) ×6 IMPLANT
GAUZE XEROFORM 1X8 LF (GAUZE/BANDAGES/DRESSINGS) ×3 IMPLANT
GLOVE BIO SURGEON STRL SZ7 (GLOVE) ×3 IMPLANT
GLOVE SURG UNDER LTX SZ7 (GLOVE) ×3 IMPLANT
GOWN STRL REUS W/ TWL LRG LVL3 (GOWN DISPOSABLE) ×4 IMPLANT
GOWN STRL REUS W/TWL LRG LVL3 (GOWN DISPOSABLE) ×4
HANDPIECE VERSAJET DEBRIDEMENT (MISCELLANEOUS) IMPLANT
IV NS 1000ML (IV SOLUTION)
IV NS 1000ML BAXH (IV SOLUTION) IMPLANT
IV NS IRRIG 3000ML ARTHROMATIC (IV SOLUTION) ×1 IMPLANT
KIT STIMULAN RAPID CURE 5CC (Orthopedic Implant) ×1 IMPLANT
KIT TURNOVER KIT A (KITS) ×3 IMPLANT
LABEL OR SOLS (LABEL) ×3 IMPLANT
MANIFOLD NEPTUNE II (INSTRUMENTS) ×3 IMPLANT
NDL FILTER BLUNT 18X1 1/2 (NEEDLE) ×2 IMPLANT
NDL HYPO 25X1 1.5 SAFETY (NEEDLE) ×4 IMPLANT
NEEDLE FILTER BLUNT 18X 1/2SAF (NEEDLE) ×1
NEEDLE FILTER BLUNT 18X1 1/2 (NEEDLE) ×1 IMPLANT
NEEDLE HYPO 25X1 1.5 SAFETY (NEEDLE) ×4 IMPLANT
NS IRRIG 500ML POUR BTL (IV SOLUTION) ×3 IMPLANT
PACK EXTREMITY ARMC (MISCELLANEOUS) ×3 IMPLANT
PAD ABD DERMACEA PRESS 5X9 (GAUZE/BANDAGES/DRESSINGS) ×3 IMPLANT
PULSAVAC PLUS IRRIG FAN TIP (DISPOSABLE)
RASP SM TEAR CROSS CUT (RASP) IMPLANT
SOL PREP PVP 2OZ (MISCELLANEOUS) ×2
SOLUTION PREP PVP 2OZ (MISCELLANEOUS) ×2 IMPLANT
STOCKINETTE STRL 6IN 960660 (GAUZE/BANDAGES/DRESSINGS) ×3 IMPLANT
SUT ETHILON 3-0 FS-10 30 BLK (SUTURE) ×2
SUT ETHILON 4-0 (SUTURE)
SUT ETHILON 4-0 FS2 18XMFL BLK (SUTURE)
SUT VIC AB 3-0 SH 27 (SUTURE) ×4
SUT VIC AB 3-0 SH 27X BRD (SUTURE) ×2 IMPLANT
SUT VIC AB 4-0 FS2 27 (SUTURE) IMPLANT
SUTURE EHLN 3-0 FS-10 30 BLK (SUTURE) ×4 IMPLANT
SUTURE ETHLN 4-0 FS2 18XMF BLK (SUTURE) IMPLANT
SWAB CULTURE AMIES ANAERIB BLU (MISCELLANEOUS) IMPLANT
SYR 10ML LL (SYRINGE) ×3 IMPLANT
TIP FAN IRRIG PULSAVAC PLUS (DISPOSABLE) IMPLANT
WATER STERILE IRR 500ML POUR (IV SOLUTION) ×3 IMPLANT

## 2021-12-09 NOTE — Progress Notes (Addendum)
?  Progress Note ? ? ?Patient: [name] [name] [id number] DOB: 12/11/1940 DOA: 12/07/2021     2 ?DOS: the patient was seen and examined on 12/09/2021 ?  ?Brief hospital course: ?No notes on file ? ?Assessment and Plan: ?* Abscess of right foot ?Status post I&D of large abscess in the ED.   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 ?Plan: ?--FIRST RAY AMPUTATION today ?--cont vanc and ceftriaxone ? ?Chronic hyponatremia ?--likely chronic.  Na 133 in 2021.  Did not improve with IVF. ?--monitor for now ? ?Transaminitis ?Unclear etiology.  Hepatitis panel neg.  Tylenol level wnl. ?Plan: ?--trend ? ?Type 2 diabetes mellitus with diabetic polyneuropathy, without long-term current use of insulin ([place]) ?--Well controlled.  A1c 7.0. ?--no need for BG checks and SSI ? ?Chronic systolic CHF (congestive heart failure) ([place]) ?--cont home coreg  ? ?Morbid obesity ([place]) ?--BMI 35.52 with a serious health condition of diabetes ? ? ?CKD 3A ?--Cr at baseline. ? ?Sepsis ruled out ? ?  ? ?Subjective:  ?Pt went for FIRST RAY AMPUTATION today. ? ? ?Physical Exam: ? ?Constitutional: NAD, AAOx3 ?HEENT: conjunctivae and lids normal, EOMI ?CV: No cyanosis.   ?RESP: normal respiratory effort, on RA ?Neuro: II - XII grossly intact.   ?Psych: Normal mood and affect.  Appropriate judgement and reason ? ? ?Data Reviewed: ? ?Family Communication:  ? ?Disposition: ?Status is: Inpatient ? ? Planned Discharge Destination: Home ? ? ? ?Time spent: 35 minutes ? ?Author: ?Enzo Bi, MD ?12/09/2021 7:21 PM ? ?For on call review [email or web address].  ?

## 2021-12-09 NOTE — Transfer of Care (Signed)
Immediate Anesthesia Transfer of Care Note ? ?Patient: [name] [name] ? ?Procedure(s) Performed: INCISION AND DRAINAGE (Right) ?AMPUTATION RAY-PARTIAL FIRST RAY AMPUTATION (Right) ? ?Patient Location: PACU ? ?Anesthesia Type:General ? ?Level of Consciousness: awake ? ?Airway & Oxygen Therapy: Patient Spontanous Breathing and Patient connected to face mask oxygen ? ?Post-op Assessment: Report given to RN ? ?Post vital signs: stable ? ?Last Vitals:  ?Vitals Value Taken Time  ?BP    ?Temp    ?Pulse 105 12/09/21 1808  ?Resp 18 12/09/21 1808  ?SpO2 98 % 12/09/21 1808  ?Vitals shown include unvalidated device data. ? ?Last Pain:  ?Vitals:  ? 12/09/21 1618  ?TempSrc: Oral  ?PainSc:   ?   ? ?  ? ?Complications: No notable events documented. ?

## 2021-12-09 NOTE — Plan of Care (Signed)

## 2021-12-09 NOTE — Progress Notes (Signed)
PODIATRY / FOOT AND ANKLE SURGERY PROGRESS NOTE ? ?Requesting Physician: Dr. Fran Lowes ? ?Reason for consult: Right foot infection ? ?Chief Complaint: Right first metatarsal phalangeal joint infection/abscess ?  ?HPI: [name] [name] is a 81 y.o. male who presents today resting at bedside sitting up comfortably.  Patient still states that he has some pain to the first metatarsal phalangeal joint area of the right side.  He wants to discuss the MRI results today as well as potential surgical intervention.  Patient currently denies nausea, vomiting, fevers, chills.  Patient seems irritable today at bedside. ? ?PMHx:  ?Past Medical History:  ?Diagnosis Date  ? AAA (abdominal aortic aneurysm) (HCC)   ? ACE inhibitor associated hyperkalemia 09/20/2015  ? Arthritis   ? CAD (coronary artery disease)   ? a. 03/2014 NSTEMI--> 05/2014 CABG x 5 (LIMA->D1, VG->LAD, VG->OM1, VG->OM2, VG->RPDA);  c. 04/2015 Cath: LM nl, LAD 100ost, 40d, RI 80, LCX 80p/m, OM1 70, OM2 80, RCA 20p, 53m, 90d, VG->dLAD min irregs, VG->OM1 100, VG->OM2 100, VG->RPDA nl, LIMA->D2 nl.  ? Chronic systolic CHF (congestive heart failure) (HCC)   ? a. EF 25-30% by 2D ECHO 04/20/14. Severely dec global hypokinesis. mildly elevated RVSP;  b. 12/2014 Echo: EF 40-45%, mod dil LA, mildly dil RV/RA;  c. 04/2015 Echo: EF 30-35%, sev antsept HK, Gr1 DD, mildly dil RA/LA;  d. 07/2015 Echo: EF 35-40%, ant/antsept HK, Gr1 DD, mildly red RV fxn.  ? Diabetes mellitus without complication (HCC)   ? Epigastric hernia   ? Frequent PVCs   ? a. 24 hr Holter 06/2015: >28K PVCs accounting for 25% of all beats, rare PAC-->mexilitene started;  b. 07/2015 Holter: 1700 PVC's/24 hrs.  ? GERD (gastroesophageal reflux disease)   ? Gout   ? Hyperlipidemia   ? Hypertension   ? Ischemic cardiomyopathy   ? a. EF 25-30% 03/2014;  b. EF 40-45% 12/2014.  ? Mitral regurgitation   ? a. 03/2014 TEE: mild to mod MR.  ? Nephrolithiasis   ? Neuromuscular disorder (HCC)   ? DIABETIC NEUROPATHY  ? Obesity   ?  Umbilical hernia   ? ? ?Surgical Hx:  ?Past Surgical History:  ?Procedure Laterality Date  ? CARDIAC CATHETERIZATION    ? [facility]  ? CARDIAC CATHETERIZATION N/A 05/23/2015  ? Procedure: Left Heart Cath and Coronary Angiography;  Surgeon: Iran Ouch, MD;  Location: [facility];  Service: Cardiovascular;  Laterality: N/A;  ? CARDIOVERSION N/A 01/30/2020  ? Procedure: CARDIOVERSION;  Surgeon: Iran Ouch, MD;  Location: [facility] ORS;  Service: Cardiovascular;  Laterality: N/A;  ? CARDIOVERSION N/A 08/09/2020  ? Procedure: CARDIOVERSION;  Surgeon: Dalia Heading, MD;  Location: [facility] ORS;  Service: Cardiovascular;  Laterality: N/A;  ? CARPAL TUNNEL RELEASE Right   ? CORONARY ARTERY BYPASS GRAFT N/A 06/06/2014  ? Procedure: CORONARY ARTERY BYPASS GRAFTING (CABG) x 5 USING LEFT AND RIGHT SAPHENOUS LEG VEIN HARVESTED ENDOSCOPICALLY;  Surgeon: Kerin Perna, MD;  Location: [facility] OR;  Service: Open Heart Surgery;  Laterality: N/A;  ? ESOPHAGOGASTRODUODENOSCOPY (EGD) WITH PROPOFOL N/A 02/11/2016  ? Procedure: ESOPHAGOGASTRODUODENOSCOPY (EGD) WITH PROPOFOL with dialation;  Surgeon: [name], MD;  Location: [facility];  Service: Endoscopy;  Laterality: N/A;  diabetic, oral meds  ? INTRAOPERATIVE TRANSESOPHAGEAL ECHOCARDIOGRAM N/A 06/06/2014  ? Procedure: INTRAOPERATIVE TRANSESOPHAGEAL ECHOCARDIOGRAM;  Surgeon: Kerin Perna, MD;  Location: [facility] OR;  Service: Open Heart Surgery;  Laterality: N/A;  ? ROTATOR CUFF REPAIR Right 1996,1997,2000  ? TEE WITHOUT CARDIOVERSION N/A  04/24/2014  ? Procedure: TRANSESOPHAGEAL ECHOCARDIOGRAM (TEE);  Surgeon: [name], MD;  Location: [facility] ENDOSCOPY;  Service: Cardiovascular;  Laterality: N/A;  ? ? ?FHx:  ?Family History  ?Problem Relation Age of Onset  ? Throat cancer Father   ? Stroke Father   ? Colon polyps Brother   ? Lung cancer Brother   ? Prostate cancer Neg Hx   ? Bladder Cancer Neg Hx   ? Kidney cancer Neg Hx   ? ? ?Social History:  reports that he has quit  smoking. His smoking use included cigarettes. He has a 25.00 pack-year smoking history. He has quit using smokeless tobacco.  His smokeless tobacco use included chew. He reports that he does not drink alcohol and does not use drugs. ? ?Allergies:  ?Allergies  ?Allergen Reactions  ? Glimepiride   ?  Unusual sweating and irregular heart beat  ? ? ?Medications Prior to Admission  ?Medication Sig Dispense Refill  ? acetaminophen (TYLENOL) 500 MG tablet Take 500-1,000 mg by mouth every 6 (six) hours as needed for moderate pain.    ? apixaban (ELIQUIS) 5 MG TABS tablet Take 1 tablet (5 mg total) by mouth 2 (two) times daily. 360 tablet 0  ? atorvastatin (LIPITOR) 40 MG tablet TAKE 1 TABLET BY MOUTH ONCE DAILY AT 6 PM. 90 tablet 0  ? carvedilol (COREG) 12.5 MG tablet TAKE 1 TABLET BY MOUTH TWICE DAILY WITH A MEAL 180 tablet 0  ? gabapentin (NEURONTIN) 300 MG capsule Take 300 mg by mouth 4 (four) times daily.     ? mexiletine (MEXITIL) 200 MG capsule Take 200 mg by mouth 4 (four) times daily.    ? valsartan (DIOVAN) 40 MG tablet Take 40 mg by mouth daily.    ? clotrimazole-betamethasone (LOTRISONE) cream Apply 1 application topically 2 (two) times daily as needed (skin irritation/breakouts).  (Patient not taking: Reported on 12/07/2021)    ? colchicine 0.6 MG tablet Take 0.6 mg by mouth daily as needed (for gout flareup).  (Patient not taking: Reported on 12/07/2021)    ? collagenase (SANTYL) ointment Apply topically daily. (Patient not taking: Reported on 08/01/2020) 15 g 0  ? diphenhydramine-acetaminophen (TYLENOL PM) 25-500 MG TABS tablet Take 1 tablet by mouth at bedtime as needed (sleep).    ? Elastic Bandages & Supports (BANDAGE ROLL 4.5"X4YD) MISC 1 units of lipase by Does not apply route daily. 10 each 0  ? furosemide (LASIX) 40 MG tablet Take 1 tablet by mouth once daily (Patient not taking: Reported on 12/07/2021) 90 tablet 3  ? mexiletine (MEXITIL) 150 MG capsule Take 300 mg by mouth 2 (two) times daily. (Patient not  taking: Reported on 12/07/2021)    ? nitroGLYCERIN (NITROSTAT) 0.4 MG SL tablet Place 1 tablet (0.4 mg total) under the tongue every 5 (five) minutes as needed for chest pain. 25 tablet 3  ? povidone-iodine (BETADINE) 10 % external solution Apply 1 application topically as needed for wound care. (Patient taking differently: Apply 1 application. topically daily.) 480 mL 0  ? sacubitril-valsartan (ENTRESTO) 24-26 MG Take 1 tablet by mouth 2 (two) times daily. (Patient not taking: Reported on 08/01/2020) 60 tablet 0  ? SSD 1 % cream Apply 1 application topically daily.  (Patient not taking: Reported on 12/07/2021)    ? ? ?Physical Exam: ?General: Alert and oriented.  No apparent distress.  Irritable/rude. ? ?Vascular: DP/PT pulses +1 bilateral, mild to moderate nonpitting edema present to bilateral lower extremities.  Erythema present to the right  first metatarsal phalangeal joint and to a lesser extent the dorsal forefoot left side.  No hair growth noted to digits bilaterally. ? ?Neuro: Light touch sensation reduced to bilateral lower extremities. ? ?Derm: Open wound present to the right dorsal medial first metatarsal phalangeal joint with purulent drainage, appears to also potentially have some gout type crystals present to the area coming out of the wound as well as purulent discharge, wound does probe to bone at this level, wound in total measures approximately 1.5 cm x 0.2 cm x 1 cm, hard bone palpable through the central area of the ulceration, associated erythema and edema present to the spot.  No other open wounds present to bilateral lower extremities. ? ? ? ? ?MSK: Right foot pain first metatarsal phalangeal joint ? ?Results for orders placed or performed during the hospital encounter of 12/07/21 (from the past 48 hour(s))  ?Lactic acid, plasma     Status: None  ? Collection Time: 12/07/21 11:38 AM  ?Result Value Ref Range  ? Lactic Acid, Venous 1.7 0.5 - 1.9 mmol/L  ?  Comment: Performed at [facility], [address] [place], [place] [address]  ?Comprehensive metabolic panel     Status: Abnormal  ? Collection Time: 12/07/21 11:38 AM  ?Result Value Ref Range  ? Sodium 131 (L) 135 - 145 mmol/L  ? [place]

## 2021-12-09 NOTE — Anesthesia Preprocedure Evaluation (Addendum)
Anesthesia Evaluation  ? ? ?History of Anesthesia Complications ?Negative for: history of anesthetic complications ? ?Airway ?Mallampati: III ? ? ? ? ? ? Dental ? ?(+) Dental Advidsory Given, Poor Dentition ?  ?Pulmonary ?neg pulmonary ROS, former smoker,  ?  ? ? ? ? ? ? ? Cardiovascular ?hypertension, (-) angina+ CAD (s/p MI, CABG), + Past MI, + Cardiac Stents, + CABG and +CHF (ischemic cardiomyopathy, EF 35-40%)  ?+ dysrhythmias (a fib on Eliquis) Atrial Fibrillation (-) Valvular Problems/Murmurs ? ?AAA ? ?ECG 12/07/21:  ?Atrial flutter ?Left axis deviation ?Low voltage, extremity leads ? ?Echo 01/2020:  ?1. Left ventricular ejection fraction, by estimation, is 35 to 40%. The left ventricle has moderately decreased function. The left ventricle demonstrates global hypokinesis. Left ventricular diastolic function could not be evaluated.  ??2. Right ventricular systolic function is low normal. The right ventricular size is normal.  ??3. Left atrial size was mildly dilated.  ??4. The mitral valve is normal in structure. No evidence of mitral valve regurgitation.  ??5. The aortic valve is grossly normal. Aortic valve regurgitation is not visualized.  ? ?  ?Neuro/Psych ?neg Seizures  Neuromuscular disease (diabetic neuropathy)   ? GI/Hepatic ?Neg liver ROS, GERD  ,  ?Endo/Other  ?diabetes, Type 2Obesity  ? Renal/GU ?Renal disease (kidney stones)  ? ?  ?Musculoskeletal ?Gout   ? Abdominal ?  ?Peds ? Hematology ?  ?Anesthesia Other Findings ?Cardiology note 11/15/21:  ?81 y.o. 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 ? ?#CAD s/p CABG times 12/2013 at [facility] ?Most recent heart catheterization completed in 2016 showed patent 3 out of 5 bypass grafts (occluded OM grafts). He is not currently having any symptoms of angina.-Continue  aspirin 81 mg indefinitely ?-Continue atorvastatin 40 mg daily ?-Continue carvedilol 12.5 mg twice daily ?-Lipid panel 02/05/2021-TC 83, LDL 39, HDL 24, TG 102 ? ?#HFrEF (EF 35-40%) ?#Frequent PVCs ?Overall doing well and is relatively well compensated today. NYHA class I-II symptoms. He is fairly inactive but has no obvious shortness of breath and no signs or symptoms of heart failure. ?-Continue Coreg 12.5 mg twice daily ?-Continue valsartan 40 mg daily ?-Continue furosemide 40 mg daily ?-Continue mexiletine per electrophysiology for PVCs-200 mg 4 times a day ?- Cr 1.1 in 07/2021. ? ?#Atrial fibrillation, paroxysmal ?Recently has been in sinus rhythm.  ?-Continue Coreg 12.5 ?-Continue Eliquis 5 mg twice daily ? ?#Essential hypertension ?Continue Coreg, valsartan, Lasix. Blood pressure is excellent today. ? ?Return in about 6 months (around 05/18/2022). ? ? Reproductive/Obstetrics ? ?  ? ? ? ? ? ? ? ? ? ? ? ? ? ?  ?  ? ? ? ? ? ? ?                                  Anesthesia Evaluation  ?Patient identified by MRN, date of birth, ID band ?Patient awake ? ? ? ?Reviewed: ?Allergy & Precautions, H&P , NPO status , Patient's Chart, lab work & pertinent test results, reviewed documented beta blocker date and time  ? ?Airway ?Mallampati: II ? ? ?Neck ROM: full ? ? ? Dental ? ?(+) Poor Dentition ?  ?Pulmonary ?neg pulmonary ROS, former smoker,  ?  ?Pulmonary exam normal ? ? ? ? ? ? ? Cardiovascular ?Exercise Tolerance: Poor ?hypertension, On Medications ?+  angina with exertion + CAD, +CHF and + DOE  ?(-) Orthopnea and (-) PND Normal cardiovascular exam ?Rhythm:regular Rate:Normal ? ? ?  ?Neuro/Psych ? Neuromuscular disease negative psych ROS  ? GI/Hepatic ?Neg liver ROS, GERD  Medicated,  ?Endo/Other  ?diabetes, Well Controlled, Type 2Morbid obesity ? Renal/GU ?Renal disease  ?negative genitourinary ?  ?Musculoskeletal ? ? Abdominal ?  ?Peds ? Hematology ?negative hematology ROS ?(+)   ?Anesthesia Other Findings ?Past Medical  History: ?No date: AAA (abdominal aortic aneurysm) ([place]) ?09/20/2015: ACE inhibitor associated hyperkalemia ?No date: Arthritis ?No date: CAD (coronary artery disease) ?    Comment:  a. 03/2014 NSTEMI--> 05/2014 CABG x 5 (LIMA->D1, VG->LAD, ?             VG->OM1, VG->OM2, VG->RPDA);  c. 04/2015 Cath: LM nl, LAD  ?             100ost, 40d, RI 80, LCX 80p/m, OM1 70, OM2 80, RCA 20p,  ?             53m, 90d, VG->dLAD min irregs, VG->OM1 100, VG->OM2 100,  ?             VG->RPDA nl, LIMA->D2 nl. ?No date: Chronic systolic CHF (congestive heart failure) ([place]) ?    Comment:  a. EF 25-30% by 2D ECHO 04/20/14. Severely dec global  ?             hypokinesis. mildly elevated RVSP;  b. 12/2014 Echo: EF  ?             40-45%, mod dil LA, mildly dil RV/RA;  c. 04/2015 Echo: EF ?             30-35%, sev antsept HK, Gr1 DD, mildly dil RA/LA;  d.  ?             07/2015 Echo: EF 35-40%, ant/antsept HK, Gr1 DD, mildly  ?             red RV fxn. ?No date: Diabetes mellitus without complication ([place]) ?No date: Epigastric hernia ?No date: Frequent PVCs ?    Comment:  a. 24 hr Holter 06/2015: >28K PVCs accounting for 25% of ?             all beats, rare PAC-->mexilitene started;  b. 07/2015  ?             Holter: 1700 PVC's/24 hrs. ?No date: GERD (gastroesophageal reflux disease) ?No date: Gout ?No date: Hyperlipidemia ?No date: Hypertension ?No date: Ischemic cardiomyopathy ?    Comment:  a. EF 25-30% 03/2014;  b. EF 40-45% 12/2014. ?No date: Mitral regurgitation ?    Comment:  a. 03/2014 TEE: mild to mod MR. ?No date: Nephrolithiasis ?No date: Neuromuscular disorder ([place]) ?    Comment:  DIABETIC NEUROPATHY ?No date: Obesity ?No date: Umbilical hernia ?Past Surgical History: ?No date: CARDIAC CATHETERIZATION ?    Comment:  [facility] ?05/23/2015: CARDIAC CATHETERIZATION; N/A ?    Comment:  Procedure: Left Heart Cath and Coronary Angiography;   ?             Surgeon: Wellington Hampshire, MD;  Location: [place] CV  ?             LAB;  Service:  Cardiovascular;  Laterality: N/A; ?No date: CARPAL TUNNEL RELEASE; Right ?06/06/2014: CORONARY ARTERY BYPASS GRAFT; N/A ?    Comment:  Procedure: CORONARY ARTERY BYPASS GRAFTING (CABG) x 5  ?  USING LEFT AND RIGHT SAPHENOUS LEG VEIN HARVESTED  ?             ENDOSCOPICALLY;  Surgeon: Ivin Poot, MD;  Location: ?             [place];  Service: Open Heart Surgery;  Laterality: N/A; ?02/11/2016: ESOPHAGOGASTRODUODENOSCOPY (EGD) WITH PROPOFOL; N/A ?    Comment:  Procedure: ESOPHAGOGASTRODUODENOSCOPY (EGD) WITH  ?             PROPOFOL with dialation;  Surgeon: Lucilla Lame, MD;   ?             Location: [place];  Service: Endoscopy;   ?             Laterality: N/A;  diabetic, oral meds ?06/06/2014: INTRAOPERATIVE TRANSESOPHAGEAL ECHOCARDIOGRAM; N/A ?    Comment:  Procedure: INTRAOPERATIVE TRANSESOPHAGEAL  ?             ECHOCARDIOGRAM;  Surgeon: Ivin Poot, MD;  Location: ?             [place];  Service: Open Heart Surgery;  Laterality: N/A; ?[id number]: ROTATOR CUFF REPAIR; Right ?04/24/2014: TEE WITHOUT CARDIOVERSION; N/A ?    Comment:  Procedure: TRANSESOPHAGEAL ECHOCARDIOGRAM (TEE);   ?             Surgeon: Thayer Headings, MD;  Location: [place];   ?             Service: Cardiovascular;  Laterality: N/A; ? ? Reproductive/Obstetrics ?negative OB ROS ? ?  ? ? ? ? ? ? ? ? ? ? ? ? ? ?  ?  ? ? ? ? ? ? ? ? ?Anesthesia Physical ? ?Anesthesia Plan ? ?ASA: IV ? ?Anesthesia Plan: General  ? ?Post-op Pain Management:   ? ?Induction:  ? ?PONV Risk Score and Plan: Propofol infusion ? ?Airway Management Planned: Nasal Cannula ? ?Additional Equipment:  ? ?Intra-op Plan:  ? ?Post-operative Plan:  ? ?Informed Consent: I have reviewed the patients History and Physical, chart, labs and discussed the procedure including the risks, benefits and alternatives for the proposed anesthesia with the patient or authorized representative who has indicated his/her understanding and acceptance.  ? ? ? ?Dental  Advisory Given ? ?Plan Discussed with: CRNA ? ?Anesthesia Plan Comments:   ? ? ? ? ? ? ?Anesthesia Quick Evaluation ? ?Anesthesia Physical ?Anesthesia Plan ? ?ASA: 3 ? ?Anesthesia Plan: General  ? ?Post-op Pain Manag

## 2021-12-09 NOTE — H&P (Signed)
HISTORY AND PHYSICAL INTERVAL NOTE: ? ?12/09/2021 ? ?4:40 PM ? 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 The various methods of treatment have been discussed with the patient.  No guarantees were given.  After consideration of risks, benefits and other options for treatment, the patient has consented to surgery.  I have reviewed the patients? chart and labs.   ? ?PROCEDURE: ?RIGHT FOOT INCISION AND DRAINAGE ?RIGHT PARTIAL 1ST RAY AMPUTATION VERSUS 1ST MTPJ JOINT RESECTION ?APPLICATION OF ABX BEADS ? ? ?A history and physical examination was performed in the hospital.  The patient was reexamined.  There have been no changes to this history and physical examination. Patient still continues to be rude.  He states that he will be leaving the hospital tomorrow.  Suggest that he stays a few more days due to severity of infection.  Patient can leave AMA if he wants to. ? ?Caroline More, DPM ? ?

## 2021-12-09 NOTE — Progress Notes (Addendum)
[id number] ?Pt being transported to ultrasound at this time  ? ?1005 ?Pt has returned to room  ? ?1608 ?Report given to Pre op nurse Juliann Pulse. Pt has been NPO and nurse send IV abx down with pt chart ? ?1625 ?Pt transporting to OR via bed at this time  ?

## 2021-12-09 NOTE — Op Note (Addendum)
PODIATRY / FOOT AND ANKLE SURGERY OPERATIVE REPORT ? ? ? ?SURGEON: Caroline More, DPM ? ?PRE-OPERATIVE DIAGNOSIS:  ?1.  Abscess right first metatarsal phalangeal joint, possible septic joint ?2.  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  Cellulitis right first metatarsal phalangeal joint ?4.  Diabetes type 2 polyneuropathy ? ?POST-OPERATIVE DIAGNOSIS: Same ? ?PROCEDURE(S): ?Right foot incision and drainage with debridement of bone ?Application of antibiotic beads ? ?HEMOSTASIS: Right ankle tourniquet ? ?ANESTHESIA: MAC ? ?ESTIMATED BLOOD LOSS: 15 cc ? ?FINDING(S): ?1.  Large gouty tophi present to the first metatarsal phalangeal joint, no purulence ?2.  No clinical evidence of osteomyelitis based on joint appearance today ? ?PATHOLOGY/SPECIMEN(S): Right first metatarsal phalangeal joint bone culture and path specimen ? ?INDICATIONS:   ?[name] [name] is a 81 y.o. male who presented to [facility] due to swelling and pain present to the first metatarsal phalangeal joint level.  Patient was previously seen a couple weeks before this by Dr. Cleda Mccreedy who advised patient that he was having a gout attack to the area.  Patient was treated appropriately but subsequently developed worsening pain and felt intense pressure to the area.  Patient was seen in the emergency room and had x-rays taken which showed arthritic changes to the first metatarsal phalangeal joint but no substantial evidence for osteomyelitis.  Subsequently the emergency room physician decided to perform an incision and drainage to the area due to concerns for abscess present.  Apparently an abscess was drained to the area.  Podiatry team was consulted then further for further evaluation.  On evaluation patient appeared to have some gouty tophi coming from the area of the I&D, erythema to the area consistent with either acute inflammation from gout or cellulitic changes, and subsequently  through the I&D area there did appear to be bone exposed.  MRI imaging was taken showing that tophaceous gout present to the area as well as arthritic changes.  Potentially some subtle changes consistent with first metatarsal phalangeal joint osteoarthritis versus septic joint as well based on the MRI.  All treatment options were discussed with the patient both conservative and surgical attempts at correction including potential risks and complications at this time patient is elected for procedure described above.  Discussed with patient that we will do our best to try to save his joint but ultimately if he ends up having a worsening infection could have to perform partial first ray amputation versus joint resection in the future.  Patient during hospital stay has been very rude talking down to staff and myself and been relatively and thankful for any care.  Patient states prior to procedure that he wants to leave tomorrow regardless of his medical status and leave [place] potentially.  Discussed with patient that we will have to look at it again tomorrow for further assessment. ? ?DESCRIPTION: ?After obtaining full informed written consent, the patient was brought back to the operating room and placed supine upon the operating table.  The patient received IV antibiotics prior to induction.  After obtaining adequate anesthesia, a first ray Mayo block was performed with 20 cc of 1% lidocaine plain.  The patient was prepped and draped in the standard fashion.  An Esmarch bandage was used to exsanguinate the right lower extremity and the pneumatic ankle tourniquet was inflated. ? ?Attention was directed to the first metatarsal phalangeal joint area and the area of the previous I&D where an incision was made to ellipse the  previously made incision performed by the emergency room physician over the dorsal medial aspect of the joint.  The the previous skin incision was ellipsed and passed off the operative  site.  The incision was deepened through the subcutaneous tissues utilizing sharp and blunt dissection and care was taken to identify and retract all vital neurovascular structures and all venous contributories were cauterized as necessary.  There appeared to be a large amount of tophaceous gouty crystals present to this area which were resected and passed off in the operative site which did appear to be coming from the first metatarsal phalangeal joint and around the extensor tendon course on the dorsum of the joint.  A capsular incision was made and the capsular and periosteal tissue was reflected medially and laterally thereby exposing the first metatarsal phalangeal joint further.  There appeared to be notable amount of joint fluid present within the joint but did not appear to have any purulent drainage from the area.  A large amount of tophaceous gouty crystals were then resected still at the operative site.  The articular cartilage appeared to be significantly damage to the first metatarsal phalangeal joint and almost nonexistent.  The joint still appeared to move fairly smoothly though.  The bone appeared to be fairly hard in nature at the central aspect of the joint but the medial eminence area appeared to be fairly soft as well as a small dorsal medial exostosis.  Sagittal bone saw was then used to resect the medial eminence and a small portion of the dorsal aspect of the joint.  These bone pieces were passed off the operative site and one was sent off for culture and the other was sent off for pathology.  The surgical site was evaluated further and noted to have no significant clinical areas of infection.  The bone appeared to be hard in this particular area and once again did not appear to have any purulent discharge.  The surgical site was flushed with copious amounts normal sterile saline with a pulse lavage.  Antibiotic beads with vancomycin impregnated were then placed into the first metatarsal  phalangeal joint level.  The capsular and periosteal tissue was then reapproximated well coapted with 3-0 Vicryl along with subcutaneous tissue.  The skin was then reapproximated well coapted with 3-0 nylon in a combination of simple and horizontal mattress type stitching. ? ?A postoperative dressing was then applied consisting of Xeroform followed by 4 x 4 gauze, Kling, ABD, Kerlix, Ace wrap.  The pneumatic ankle tourniquet was deflated and a prompt hyperemic response was noted to all digits right foot.  The patient tolerated the procedure and anesthesia well was transferred to recovery room vital signs stable vascular status intact to all toes the right foot.  Patient will be discharged to the inpatient room with the appropriate orders and instructions.  Patient is to remain partial weightbearing with heel contact in a surgical shoe.  We will change the dressing tomorrow and evaluate the incisional area.  If looking okay patient can likely go home on oral antibiotics.  If bone cultures do come back positive or path comes back positive then may require IV antibiotics for a significant period of time versus revisional procedure including partial first ray amputation versus first metatarsal phalangeal joint resection.  Again intraoperatively there did not appear to be any evidence of osteomyelitis or purulent discharge.  Appearance more so appear to be related to large tophaceous gout.  Vascular consult also placed due to diminished waveforms on  ABIs although ABIs themselves appear to be mildly diseased.  Patient will likely need outpatient follow-up with vascular surgery.  If patient does have potential wound healing issues then may require angiogram. ? ?COMPLICATIONS: None ? ?CONDITION: Good, stable ? ?Caroline More, DPM ? ?

## 2021-12-10 ENCOUNTER — Encounter: Payer: Self-pay | Admitting: Podiatry

## 2021-12-10 LAB — BASIC METABOLIC PANEL
Anion gap: 8 (ref 5–15)
BUN: 27 mg/dL — ABNORMAL HIGH (ref 8–23)
CO2: 26 mmol/L (ref 22–32)
Calcium: 8.6 mg/dL — ABNORMAL LOW (ref 8.9–10.3)
Chloride: 98 mmol/L (ref 98–111)
Creatinine, Ser: 1.29 mg/dL — ABNORMAL HIGH (ref 0.61–1.24)
GFR, Estimated: 57 mL/min — ABNORMAL LOW (ref >60)
Glucose, Bld: 130 mg/dL — ABNORMAL HIGH (ref 70–99)
Potassium: 3.7 mmol/L (ref 3.5–5.1)
Sodium: 132 mmol/L — ABNORMAL LOW (ref 135–145)

## 2021-12-10 LAB — CBC
HCT: 35.1 % — ABNORMAL LOW (ref 39.0–52.0)
Hemoglobin: 11.2 g/dL — ABNORMAL LOW (ref 13.0–17.0)
MCH: 27.4 pg (ref 26.0–34.0)
MCHC: 31.9 g/dL (ref 30.0–36.0)
MCV: 85.8 fL (ref 80.0–100.0)
Platelets: 298 10*3/uL (ref 150–400)
RBC: 4.09 MIL/uL — ABNORMAL LOW (ref 4.22–5.81)
RDW: 13.5 % (ref 11.5–15.5)
WBC: 10.8 10*3/uL — ABNORMAL HIGH (ref 4.0–10.5)
nRBC: 0 % (ref 0.0–0.2)

## 2021-12-10 LAB — GLUCOSE, CAPILLARY
Glucose-Capillary: 112 mg/dL — ABNORMAL HIGH (ref 70–99)
Glucose-Capillary: 128 mg/dL — ABNORMAL HIGH (ref 70–99)

## 2021-12-10 LAB — URIC ACID: Uric Acid, Serum: 7.1 mg/dL (ref 3.7–8.6)

## 2021-12-10 LAB — MAGNESIUM: Magnesium: 2 mg/dL (ref 1.7–2.4)

## 2021-12-10 MED ORDER — PREDNISONE 20 MG PO TABS
40.0000 mg | ORAL_TABLET | Freq: Every day | ORAL | Status: DC
Start: 2021-12-10 — End: 2021-12-10
  Administered 2021-12-10: 40 mg via ORAL
  Filled 2021-12-10: qty 2

## 2021-12-10 MED ORDER — AMOXICILLIN-POT CLAVULANATE 875-125 MG PO TABS
1.0000 | ORAL_TABLET | Freq: Two times a day (BID) | ORAL | Status: DC
  Administered 2021-12-10: 1 via ORAL
  Filled 2021-12-10: qty 1

## 2021-12-10 MED ORDER — AMOXICILLIN-POT CLAVULANATE 875-125 MG PO TABS: 1.0000 | ORAL_TABLET | Freq: Two times a day (BID) | ORAL | 0 refills | Status: AC

## 2021-12-10 MED ORDER — SULFAMETHOXAZOLE-TRIMETHOPRIM 800-160 MG PO TABS: 1.0000 | ORAL_TABLET | Freq: Two times a day (BID) | ORAL | 0 refills | Status: AC

## 2021-12-10 MED ORDER — TRAMADOL HCL 50 MG PO TABS: 50.0000 mg | ORAL_TABLET | Freq: Four times a day (QID) | ORAL | 0 refills | Status: AC | PRN

## 2021-12-10 MED ORDER — FUROSEMIDE 40 MG PO TABS: 40.0000 mg | ORAL_TABLET | Freq: Every day | ORAL | 0 refills | Status: AC

## 2021-12-10 MED ORDER — SULFAMETHOXAZOLE-TRIMETHOPRIM 800-160 MG PO TABS
1.0000 | ORAL_TABLET | Freq: Two times a day (BID) | ORAL | Status: DC
  Administered 2021-12-10: 1 via ORAL
  Filled 2021-12-10: qty 1

## 2021-12-10 MED ORDER — VANCOMYCIN HCL 1500 MG/300ML IV SOLN
1500.0000 mg | INTRAVENOUS | Status: DC
  Filled 2021-12-10: qty 300

## 2021-12-10 MED ORDER — FUROSEMIDE 10 MG/ML IJ SOLN
40.0000 mg | Freq: Once | INTRAMUSCULAR | Status: AC
Start: 2021-12-10 — End: 2021-12-10
  Administered 2021-12-10: 40 mg via INTRAVENOUS
  Filled 2021-12-10: qty 4

## 2021-12-10 MED ORDER — PREDNISONE 10 MG PO TABS
ORAL_TABLET | ORAL | 0 refills | Status: DC
Start: 2021-12-10 — End: 2024-04-01

## 2021-12-10 NOTE — Care Management Important Message (Signed)
Important Message ? ?Patient Details  ?Name: [name] [name] ?MRN: [id number] ?Date of Birth: Mar 17, 1941 ? ? ?Medicare Important Message Given:  Yes ? ?Patient asleep upon time of visit.  Copy of Medicare IM left in room for reference. ? ? ?[name] ?12/10/2021, 2:43 PM ?

## 2021-12-10 NOTE — Progress Notes (Signed)
1 Day Post-Op  ? ?Subjective/Chief Complaint: ?Patient seen.  Standing at the sink washing up upon entering the room.  Denies any pain. ? ? ?Objective: ?Vital signs in last 24 hours: ?Temp:  [97.3 ?F (36.3 ?C)-98.8 ?F (37.1 ?C)] 98.2 ?F (36.8 ?C) (04/18 3875) ?Pulse Rate:  [92-105] 102 (04/18 0839) ?Resp:  [12-20] 18 (04/18 0839) ?BP: (96-130)/(46-72) 117/57 (04/18 6433) ?SpO2:  [95 %-100 %] 95 % (04/18 0839) ?Last BM Date : 12/08/21 ? ?Intake/Output from previous day: ?04/17 0701 - 04/18 0700 ?In: 650 [I.V.:300; IV Piggyback:350] ?Out: -  ?Intake/Output this shift: ?No intake/output data recorded. ? ?Bandage on the right foot is dry and intact.  Upon removal there is minimal bleeding on the bandaging.  Incision is well coapted.  Some significant continued erythema with some edema around the first metatarsal phalangeal joint area.  No signs of purulence or drainage. ? ? ?Lab Results:  ?Recent Labs  ?  12/09/21 ?0239 12/10/21 ?2951  ?WBC 11.4* 10.8*  ?HGB 11.8* 11.2*  ?HCT 37.3* 35.1*  ?PLT 305 298  ? ?BMET ?Recent Labs  ?  12/09/21 ?0239 12/10/21 ?8841  ?NA 131* 132*  ?K 3.7 3.7  ?CL 95* 98  ?CO2 27 26  ?GLUCOSE 155* 130*  ?BUN 27* 27*  ?CREATININE 1.44* 1.29*  ?CALCIUM 8.6* 8.6*  ? ?PT/INR ?No results for input(s): LABPROT, INR in the last 72 hours. ?ABG ?No results for input(s): PHART, HCO3 in the last 72 hours. ? ?Invalid input(s): PCO2, PO2 ? ?Studies/Results: ?MR FOOT RIGHT W WO CONTRAST ? ?Result Date: 12/08/2021 ?CLINICAL DATA:  Foot swelling, diabetic, osteomyelitis suspected, xray done osteomyelitis EXAM: MRI OF THE RIGHT FOREFOOT WITHOUT AND WITH CONTRAST TECHNIQUE: Multiplanar, multisequence MR imaging of the right forefoot was performed before and after the administration of intravenous contrast. CONTRAST:  40mL GADAVIST GADOBUTROL 1 MMOL/ML IV SOLN COMPARISON:  Radiograph 12/07/2021. FINDINGS: Bones/Joint/Cartilage There is in erosion of the medial aspect of the first metatarsal head. Adjacent underlying  mild marrow edema and enhancement. Relatively preserved underlying T1 marrow signal. There is a first MTP joint effusion with synovial enhancement. Ligaments Intact Lisfranc ligament. Muscles and Tendons Diffuse intramuscular edema muscle atrophy in the foot as is commonly seen in diabetics. Soft tissues There is a soft tissue ulcer along the medial forefoot with adjacent rim enhancing fluid at the medial aspect of the first metatarsal head. This collection of fluid measures approximately 2.9 x 2.1 x 2.2 cm (coronal postcontrast image 11, axial postcontrast image 23). There is a first MTP joint effusion. IMPRESSION: Soft tissue ulcer along the medial forefoot at the level of first metatarsal head, with underlying rim enhancing fluid collection measuring 2.9 x 2.1 x 2.2 cm, which appears to be communicating with the skin surface, correlate with drainage. Adjacent first MTP joint effusion with synovial enhancement, erosion of the medial first metatarsal head and underlying marrow edema concerning for septic arthritis and early osteomyelitis at the first MTP joint. Electronically Signed   By: Caprice Renshaw M.D.   On: 12/08/2021 20:23  ? ?US ARTERIAL ABI (SCREENING LOWER EXTREMITY) ? ?Result Date: 12/09/2021 ?CLINICAL DATA:  81 year old male with a history of ulceration EXAM: NONINVASIVE PHYSIOLOGIC VASCULAR STUDY OF BILATERAL LOWER EXTREMITIES TECHNIQUE: Evaluation of both lower extremities was performed at rest, including calculation of ankle-brachial indices, multiple segmental pressure evaluation, segmental Doppler and segmental pulse volume recording. COMPARISON:  None. FINDINGS: Right ABI:  0.86 Left ABI:  0.88 Right Lower Extremity: Segmental Doppler at the right ankle demonstrates monophasic  waveforms Left Lower Extremity: Segmental Doppler at the left ankle demonstrates monophasic waveforms IMPRESSION: Resting ABI of the bilateral lower extremity in the mild range arterial occlusive disease. Segmental Doppler exam  at the ankles demonstrates monophasic waveforms. Signed, Yvone Neu. Reyne Dumas, RPVI [facility] Radiology Electronically Signed   By: Gilmer Mor D.O.   On: 12/09/2021 10:58  ? ?DG Foot 2 Views Right ? ?Result Date: 12/09/2021 ?CLINICAL DATA:  [id number]. Post op check, right foot xrays in pacu Right foot wrapped EXAM: RIGHT FOOT - 2 VIEW COMPARISON:  None. FINDINGS: Rounded 2-3 mm densities overlie the first digit surgical site likely related to wrapping versus antibiotic beads. Postsurgical changes along the first metatarsal head. There is no evidence of fracture or dislocation. Posterior calcaneal spur. Midfoot degenerative changes. Subcutaneus soft tissue edema. IMPRESSION: First digit metatarsal head surgical changes. Electronically Signed   By: Tish Frederickson M.D.   On: 12/09/2021 19:54  ? ?DG MINI C-ARM IMAGE ONLY ? ?Result Date: 12/09/2021 ?There is no interpretation for this exam.  This order is for images obtained during a surgical procedure.  Please See "Surgeries" Tab for more information regarding the procedure.   ? ?Anti-infectives: ?Anti-infectives (From admission, onward)  ? ? Start     Dose/Rate Route Frequency Ordered Stop  ? 12/09/21 1730  vancomycin (VANCOCIN) powder  Status:  Discontinued       ?   As needed 12/09/21 1730 12/09/21 1806  ? 12/08/21 1800  vancomycin (VANCOREADY) IVPB 1250 mg/250 mL       ? 1,250 mg ?166.7 mL/hr over 90 Minutes Intravenous Every 24 hours 12/07/21 1529 12/14/21 1759  ? 12/08/21 1500  cefTRIAXone (ROCEPHIN) 1 g in sodium chloride 0.9 % 100 mL IVPB       ? 1 g ?200 mL/hr over 30 Minutes Intravenous Every 24 hours 12/08/21 1417    ? 12/07/21 1530  vancomycin (VANCOREADY) IVPB 1500 mg/300 mL       ?See Hyperspace for full Linked Orders Report.  ? 1,500 mg ?150 mL/hr over 120 Minutes Intravenous  Once 12/07/21 1352 12/07/21 1811  ? 12/07/21 1400  vancomycin (VANCOCIN) IVPB 1000 mg/200 mL premix       ?See Hyperspace for full  Linked Orders Report.  ? 1,000 mg ?200 mL/hr over 60 Minutes Intravenous  Once 12/07/21 1352 12/07/21 1901  ? 12/07/21 1345  cefTRIAXone (ROCEPHIN) 1 g in sodium chloride 0.9 % 100 mL IVPB       ? 1 g ?200 mL/hr over 30 Minutes Intravenous  Once 12/07/21 1334 12/07/21 1413  ? ?  ? ? ?Assessment/Plan: ?s/p Procedure(s): ?INCISION AND DRAINAGE (Right) ?AMPUTATION RAY-PARTIAL FIRST RAY AMPUTATION (Right) ?Assessment: Stable status post I&D right foot. ? ?Plan: Betadine applied to the incision followed by a sterile dressing.  Patient is to keep this clean, dry, and do not remove.  Recommended minimal walking on the right foot.  Too early for intraoperative culture results.  Culture taken 3 days ago in the ER still showing no growth.  Patient could potentially be discharged on broader spectrum antibiotics and narrowed as needed pending culture results.  If discharged recommend follow-up with Dr. Excell Seltzer in 1 week. ? LOS: 3 days  ? ? ?Ricci Barker ?12/10/2021 ? ?

## 2021-12-10 NOTE — Consult Note (Signed)
?[facility] ?Vascular Consult Note ? ?MRN : [id number] ? ?[name] [name] is a 81 y.o. (12/17/1940) male who presents with chief complaint of  ?Chief Complaint  ?Patient presents with  ? Leg Swelling  ?. ? ?History of Present Illness: Patient with DM, history of CABG. CHF, EF 40%. Known arthritis, gout. Admitted with RIGHT first metatarsal abscess and cellulitis. Concern of PVD as contributing factor and ability to heal potential intervention. Patient does ambulate however, is limited secondary to arthritis in back and hips. Denies classic claudication or rest pain. Elicits bilateral lower extremity edema ? ?Current Facility-Administered Medications  ?Medication Dose Route Frequency Provider Last Rate Last Admin  ? apixaban (ELIQUIS) tablet 5 mg  5 mg Oral BID Rosetta Posner, DPM   5 mg at 12/10/21 5809  ? carvedilol (COREG) tablet 12.5 mg  12.5 mg Oral BID WC Rosetta Posner, DPM   12.5 mg at 12/10/21 9833  ? cefTRIAXone (ROCEPHIN) 1 g in sodium chloride 0.9 % 100 mL IVPB  1 g Intravenous Q24H Rosetta Posner, DPM   Stopped at 12/08/21 1528  ? gabapentin (NEURONTIN) capsule 300 mg  300 mg Oral QID Rosetta Posner, DPM   300 mg at 12/10/21 8250  ? insulin aspart (novoLOG) injection 0-9 Units  0-9 Units Subcutaneous TID WC Rosetta Posner, DPM   1 Units at 12/09/21 1324  ? irbesartan (AVAPRO) tablet 37.5 mg  37.5 mg Oral Daily Rosetta Posner, DPM   37.5 mg at 12/10/21 5397  ? mexiletine (MEXITIL) capsule 200 mg  200 mg Oral QID Rosetta Posner, DPM   200 mg at 12/10/21 6734  ? ondansetron (ZOFRAN) tablet 4 mg  4 mg Oral Q6H PRN Rosetta Posner, DPM      ? Or  ? ondansetron (ZOFRAN) injection 4 mg  4 mg Intravenous Q6H PRN Rosetta Posner, DPM      ? oxyCODONE (Oxy IR/ROXICODONE) immediate release tablet 5 mg  5 mg Oral Q4H PRN Rosetta Posner, DPM   5 mg at 12/10/21 0420  ? polyethylene glycol (MIRALAX / GLYCOLAX) packet 17 g  17 g Oral Daily PRN Rosetta Posner, DPM      ? sodium chloride flush (NS) 0.9 %  injection 10 mL  10 mL Intravenous Q12H Rosetta Posner, DPM   10 mL at 12/10/21 0919  ? traMADol (ULTRAM) tablet 50 mg  50 mg Oral Q8H PRN Rosetta Posner, DPM   50 mg at 12/10/21 0919  ? traZODone (DESYREL) tablet 50 mg  50 mg Oral QHS PRN Rosetta Posner, DPM   50 mg at 12/10/21 0150  ? vancomycin (VANCOREADY) IVPB 1250 mg/250 mL  1,250 mg Intravenous Q24H Rosetta Posner, DPM 166.7 mL/hr at 12/08/21 1722 1,250 mg at 12/08/21 1722  ? ? ?Past Medical History:  ?Diagnosis Date  ? AAA (abdominal aortic aneurysm) (HCC)   ? ACE inhibitor associated hyperkalemia 09/20/2015  ? Arthritis   ? CAD (coronary artery disease)   ? a. 03/2014 NSTEMI--> 05/2014 CABG x 5 (LIMA->D1, VG->LAD, VG->OM1, VG->OM2, VG->RPDA);  c. 04/2015 Cath: LM nl, LAD 100ost, 40d, RI 80, LCX 80p/m, OM1 70, OM2 80, RCA 20p, 25m, 90d, VG->dLAD min irregs, VG->OM1 100, VG->OM2 100, VG->RPDA nl, LIMA->D2 nl.  ? Chronic systolic CHF (congestive heart failure) (HCC)   ? a. EF 25-30% by 2D ECHO 04/20/14. Severely dec global hypokinesis. mildly elevated RVSP;  b. 12/2014 Echo: EF 40-45%, mod dil LA, mildly dil RV/RA;  c. 04/2015 Echo: EF 30-35%, sev antsept HK, Gr1 DD,  mildly dil RA/LA;  d. 07/2015 Echo: EF 35-40%, ant/antsept HK, Gr1 DD, mildly red RV fxn.  ? Diabetes mellitus without complication (HCC)   ? Epigastric hernia   ? Frequent PVCs   ? a. 24 hr Holter 06/2015: >28K PVCs accounting for 25% of all beats, rare PAC-->mexilitene started;  b. 07/2015 Holter: 1700 PVC's/24 hrs.  ? GERD (gastroesophageal reflux disease)   ? Gout   ? Hyperlipidemia   ? Hypertension   ? Ischemic cardiomyopathy   ? a. EF 25-30% 03/2014;  b. EF 40-45% 12/2014.  ? Mitral regurgitation   ? a. 03/2014 TEE: mild to mod MR.  ? Nephrolithiasis   ? Neuromuscular disorder (HCC)   ? DIABETIC NEUROPATHY  ? Obesity   ? Umbilical hernia   ? ? ?Past Surgical History:  ?Procedure Laterality Date  ? CARDIAC CATHETERIZATION    ? [facility]  ? CARDIAC CATHETERIZATION N/A 05/23/2015  ? Procedure: Left Heart Cath and  Coronary Angiography;  Surgeon: Iran Ouch, MD;  Location: [facility];  Service: Cardiovascular;  Laterality: N/A;  ? CARDIOVERSION N/A 01/30/2020  ? Procedure: CARDIOVERSION;  Surgeon: Iran Ouch, MD;  Location: [facility] ORS;  Service: Cardiovascular;  Laterality: N/A;  ? CARDIOVERSION N/A 08/09/2020  ? Procedure: CARDIOVERSION;  Surgeon: Dalia Heading, MD;  Location: [facility] ORS;  Service: Cardiovascular;  Laterality: N/A;  ? CARPAL TUNNEL RELEASE Right   ? CORONARY ARTERY BYPASS GRAFT N/A 06/06/2014  ? Procedure: CORONARY ARTERY BYPASS GRAFTING (CABG) x 5 USING LEFT AND RIGHT SAPHENOUS LEG VEIN HARVESTED ENDOSCOPICALLY;  Surgeon: Kerin Perna, MD;  Location: [facility] OR;  Service: Open Heart Surgery;  Laterality: N/A;  ? ESOPHAGOGASTRODUODENOSCOPY (EGD) WITH PROPOFOL N/A 02/11/2016  ? Procedure: ESOPHAGOGASTRODUODENOSCOPY (EGD) WITH PROPOFOL with dialation;  Surgeon: [name], MD;  Location: [facility];  Service: Endoscopy;  Laterality: N/A;  diabetic, oral meds  ? INTRAOPERATIVE TRANSESOPHAGEAL ECHOCARDIOGRAM N/A 06/06/2014  ? Procedure: INTRAOPERATIVE TRANSESOPHAGEAL ECHOCARDIOGRAM;  Surgeon: Kerin Perna, MD;  Location: [facility] OR;  Service: Open Heart Surgery;  Laterality: N/A;  ? ROTATOR CUFF REPAIR Right 1996,1997,2000  ? TEE WITHOUT CARDIOVERSION N/A 04/24/2014  ? Procedure: TRANSESOPHAGEAL ECHOCARDIOGRAM (TEE);  Surgeon: [name], MD;  Location: [facility] ENDOSCOPY;  Service: Cardiovascular;  Laterality: N/A;  ? ? ?Social History ?Social History  ? ?Tobacco Use  ? Smoking status: Former  ?  Packs/day: 1.00  ?  Years: 25.00  ?  Pack years: 25.00  ?  Types: Cigarettes  ? Smokeless tobacco: Former  ?  Types: Chew  ? Tobacco comments:  ?  quit early 1990's  ?Vaping Use  ? Vaping Use: Never used  ?Substance Use Topics  ? Alcohol use: No  ? Drug use: No  ? ? ?Family History ?Family History  ?Problem Relation Age of Onset  ? Throat cancer Father   ? Stroke Father   ? Colon polyps Brother   ?  Lung cancer Brother   ? Prostate cancer Neg Hx   ? Bladder Cancer Neg Hx   ? Kidney cancer Neg Hx   ? ? ?Allergies  ?Allergen Reactions  ? Glimepiride   ?  Unusual sweating and irregular heart beat  ? ? ? ?REVIEW OF SYSTEMS (Negative unless checked) ? ?Constitutional: [] Weight loss  [] Fever  [] Chills ?Cardiac: [] Chest pain   [] Chest pressure   [] Palpitations   [] Shortness of breath when laying flat   [] Shortness of breath at rest   [] Shortness of breath with exertion. ?Vascular:  []   Pain in legs with walking   [] Pain in legs at rest   [] Pain in legs when laying flat   [] Claudication   [] Pain in feet when walking  [] Pain in feet at rest  [] Pain in feet when laying flat   [] History of DVT   [] Phlebitis   Swelling in legs   [] Varicose veins   [] Non-healing ulcers ?Pulmonary:   [] Uses home oxygen   [] Productive cough   [] Hemoptysis   [] Wheeze  [] COPD   [] Asthma ?Neurologic:  [] Dizziness  [] Blackouts   [] Seizures   [] History of stroke   [] History of TIA  [] Aphasia   [] Temporary blindness   [] Dysphagia   [] Weakness or numbness in arms   [] Weakness or numbness in legs ?Musculoskeletal:  [x] Arthritis   [x] Joint swelling   [] Joint pain   [] Low back pain ?Hematologic:  [] Easy bruising  [] Easy bleeding   [] Hypercoagulable state   [] Anemic  [] Hepatitis ?Gastrointestinal:  [] Blood in stool   [] Vomiting blood  [] Gastroesophageal reflux/heartburn   [] Difficulty swallowing. ?Genitourinary:  [] Chronic kidney disease   [] Difficult urination  [] Frequent urination  [] Burning with urination   [] Blood in urine ?Skin:  [] Rashes   [] Ulcers   [] Wounds ?Psychological:  [] History of anxiety   []  History of major depression. ? ?Physical Examination ? ?Vitals:  ? 12/09/21 1927 12/09/21 2348 12/10/21 0410 12/10/21 0839  ?BP: 112/66 130/63 117/61 (!) 117/57  ?Pulse: 93 (!) 102 92 (!) 102  ?Resp: 16 18 20 18   ?Temp: 97.7 ?F (36.5 ?C) 98.4 ?F (36.9 ?C) 98.8 ?F (37.1 ?C) 98.2 ?F (36.8 ?C)  ?TempSrc: Oral Oral Oral   ?SpO2: 95% 100% 96% 95%  ?Weight:       ?Height:      ? ?Body mass index is 35.52 kg/m?. ?Gen:  WD/WN, NAD ?Head: [place]/AT, No temporalis wasting. Prominent temp pulse not noted. ?Ear/Nose/Throat: Hearing grossly intact, nares w/o erythema

## 2021-12-10 NOTE — Evaluation (Signed)
Physical Therapy Evaluation ?Patient Details ?Name: [name] [name] ?MRN: [id number] ?DOB: 11/20/1940 ?Today's Date: 12/10/2021 ? ?History of Present Illness ? Pt is a 81 y.o. male with medical history significant for CAD, chronic systolic CHF, morbid obesity, type 2 diabetes, hyperlipidemia, hypertension, nephrolithiasis, AAA, A-fib, who presents with pain and swelling of the right foot. Pt diagnosed with abscess, gout, and and cellulitis of the right first metatarsal phalangeal joint and is s/p I&D with application of antibiotic beads. ?  ?Clinical Impression ? Pt was pleasant and motivated to participate during the session and put forth good effort throughout. Pt required no physical assistance during the session and was generally steady with functional tasks.  Pt required cuing for proper sequencing with gait and stair training for RLE WB compliance.  Pt reported no adverse symptoms during the session with SpO2 and HR WNL on room air.  Pt will benefit from HHPT upon discharge to safely address deficits listed in patient problem list for decreased caregiver assistance and eventual return to PLOF. ? ?   ?   ? ?Recommendations for follow up therapy are one component of a multi-disciplinary discharge planning process, led by the attending physician.  Recommendations may be updated based on patient status, additional functional criteria and insurance authorization. ? ?Follow Up Recommendations Home health PT ? ?  ?Assistance Recommended at Discharge Frequent or constant Supervision/Assistance  ?Patient can return home with the following ? A little help with walking and/or transfers;A little help with bathing/dressing/bathroom;Assistance with cooking/housework;Direct supervision/assist for medications management;Help with stairs or ramp for entrance;Assist for transportation ? ?  ?Equipment Recommendations Rolling walker (2 wheels)  ?Recommendations for Other Services ?    ?  ?Functional Status Assessment Patient has  had a recent decline in their functional status and demonstrates the ability to make significant improvements in function in a reasonable and predictable amount of time.  ? ?  ?Precautions / Restrictions Precautions ?Precautions: Fall ?Restrictions ?Weight Bearing Restrictions: Yes ?RLE Weight Bearing: Partial weight bearing ?Other Position/Activity Restrictions: PWB to the RLE with post-op shoe donned and weight bearing to the heel; per MD note MD adivsed patient to generally try to limit ambulation  ? ?  ? ?Mobility ? Bed Mobility ?Overal bed mobility: Modified Independent ?  ?  ?  ?  ?  ?  ?General bed mobility comments: Extra time and effort required, typically sleeps in a recliner ?  ? ?Transfers ?Overall transfer level: Needs assistance ?Equipment used: Rolling walker (2 wheels) ?Transfers: Sit to/from Stand ?Sit to Stand: Supervision ?  ?  ?  ?  ?  ?General transfer comment: Min extra time and effort with cues for hand placement ?  ? ?Ambulation/Gait ?Ambulation/Gait assistance: Supervision ?Gait Distance (Feet): 10 Feet ?Assistive device: Rolling walker (2 wheels) ?Gait Pattern/deviations: Step-to pattern ?Gait velocity: decreased ?  ?  ?General Gait Details: Amb distance limited by this PT per MD recommendation; pt steady with amb without LOB with mod verbal and visual cues for WB compliance ? ?Stairs ?Stairs: Yes ?Stairs assistance: Min guard ?Stair Management: Step to pattern, With walker, One rail Right, Backwards, Forwards, Sideways ?Number of Stairs: 2 ?General stair comments: Pt able to ascend and descend 2 steps x 2, first attempt backwards ascending and forwards ascending with a RW with pt stating that he would not perform steps at home with this technique; second attempt performed with BUEs on R rail with cues for sequencing to minimze WB on the RLE and for WB through the heel ? ?  Wheelchair Mobility ?  ? ?Modified Rankin (Stroke Patients Only) ?  ? ?  ? ?Balance Overall balance assessment: Needs  assistance ?  ?Sitting balance-Leahy Scale: Normal ?  ?  ?Standing balance support: Bilateral upper extremity supported, During functional activity ?Standing balance-Leahy Scale: Good ?  ?  ?  ?  ?  ?  ?  ?  ?  ?  ?  ?  ?   ? ? ? ?Pertinent Vitals/Pain Pain Assessment ?Pain Assessment: No/denies pain  ? ? ?Home Living Family/patient expects to be discharged to:: Private residence ?Living Arrangements: Alone ?Available Help at Discharge: Family;Available 24 hours/day ?Type of Home: House ?Home Access: Stairs to enter ?Entrance Stairs-Rails: Right ?Entrance Stairs-Number of Steps: 4 ?  ?Home Layout: One level ?Home Equipment: Standard Walker;Cane - single point ?Additional Comments: Pt has multiple family members who can assist as needed  ?  ?Prior Function Prior Level of Function : Independent/Modified Independent ?  ?  ?  ?  ?  ?  ?Mobility Comments: Mod I amb with a SPC, no falls in the last 6 months ?ADLs Comments: Ind with ADLs, sponge bathes ?  ? ? ?Hand Dominance  ? Dominant Hand: Right ? ?  ?Extremity/Trunk Assessment  ? Upper Extremity Assessment ?Upper Extremity Assessment: Overall WFL for tasks assessed ?  ? ?Lower Extremity Assessment ?Lower Extremity Assessment: Generalized weakness ?  ? ?   ?Communication  ? Communication: No difficulties  ?Cognition Arousal/Alertness: Awake/alert ?Behavior During Therapy: [facility] for tasks assessed/performed ?Overall Cognitive Status: Within Functional Limits for tasks assessed ?  ?  ?  ?  ?  ?  ?  ?  ?  ?  ?  ?  ?  ?  ?  ?  ?  ?  ?  ? ?  ?General Comments   ? ?  ?Exercises    ? ?Assessment/Plan  ?  ?PT Assessment Patient needs continued PT services  ?PT Problem List Decreased strength;Decreased activity tolerance;Decreased balance;Decreased mobility;Decreased knowledge of use of DME;Decreased knowledge of precautions ? ?   ?  ?PT Treatment Interventions DME instruction;Gait training;Stair training;Functional mobility training;Therapeutic activities;Therapeutic  exercise;Balance training;Patient/family education   ? ?PT Goals (Current goals can be found in the Care Plan section)  ?Acute Rehab PT Goals ?Patient Stated Goal: To get back home ?PT Goal Formulation: With patient ?Time For Goal Achievement: 12/23/21 ?Potential to Achieve Goals: Good ? ?  ?Frequency Min 2X/week ?  ? ? ?Co-evaluation   ?  ?  ?  ?  ? ? ?  ?AM-PAC PT "6 Clicks" Mobility  ?Outcome Measure Help needed turning from your back to your side while in a flat bed without using bedrails?: None ?Help needed moving from lying on your back to sitting on the side of a flat bed without using bedrails?: A Little ?Help needed moving to and from a bed to a chair (including a wheelchair)?: A Little ?Help needed standing up from a chair using your arms (e.g., wheelchair or bedside chair)?: A Little ?Help needed to walk in hospital room?: A Little ?Help needed climbing 3-5 steps with a railing? : A Little ?6 Click Score: 19 ? ?  ?End of Session Equipment Utilized During Treatment: Gait belt ?Activity Tolerance: Patient tolerated treatment well ?Patient left: in chair;with call bell/phone within reach;with chair alarm set ?Nurse Communication: Mobility status;Weight bearing status;Precautions ?PT Visit Diagnosis: Difficulty in walking, not elsewhere classified (R26.2);Muscle weakness (generalized) (M62.81) ?  ? ?Time: 7116-5790 ?PT Time Calculation (min) (ACUTE  ONLY): 33 min ? ? ?Charges:   PT Evaluation ?$PT Eval Moderate Complexity: 1 Mod ?PT Treatments ?$Gait Training: 8-22 mins ?  ?   ? ?D. Elly Modena PT, DPT ?12/10/21, 2:50 PM ? ? ?

## 2021-12-10 NOTE — Plan of Care (Signed)
  Problem: Clinical Measurements: Goal: Ability to avoid or minimize complications of infection will improve Outcome: Progressing   Problem: Skin Integrity: Goal: Skin integrity will improve Outcome: Progressing   Problem: Education: Goal: Knowledge of General Education information will improve Description: Including pain rating scale, medication(s)/side effects and non-pharmacologic comfort measures Outcome: Progressing   Problem: Health Behavior/Discharge Planning: Goal: Ability to manage health-related needs will improve Outcome: Progressing   Problem: Clinical Measurements: Goal: Ability to maintain clinical measurements within normal limits will improve Outcome: Progressing Goal: Will remain free from infection Outcome: Progressing Goal: Diagnostic test results will improve Outcome: Progressing Goal: Respiratory complications will improve Outcome: Progressing Goal: Cardiovascular complication will be avoided Outcome: Progressing   

## 2021-12-10 NOTE — Progress Notes (Signed)
Blood pressure (!) 117/57, pulse (!) 102, temperature 98.2 ?F (36.8 ?C), resp. rate 18, height 6' (1.829 m), weight 118.8 kg, SpO2 95 %. ?Iv removed site c/d/I. Discussed d/c packet with pt he understood information pt transported via w/c with all belongings down to private car.  ?

## 2021-12-10 NOTE — Progress Notes (Signed)
Met with the patient to discuss DC plan and needs ?He lives at home with relatives ?He needs a RW, [employer] to deliver ?He needs HH PT and RN ?[facility] accepting the patient ?Family to provide Transportation ?

## 2021-12-10 NOTE — Discharge Summary (Signed)
? ?Physician Discharge Summary ? ? ?[name] [name]  male DOB: [birth year]  ?NLG:[id number] ? ?PCP: [name], MD ? ?Admit date: 12/07/2021 ?Discharge date: 12/10/2021 ? ?Admitted From: home ?Disposition:  home ?Home Health: Yes ?CODE STATUS: Full code ? ?Discharge Instructions   ? ? Discharge instructions   Complete by: As directed ?  ? You are discharged on 10 more day of antibiotics Bactrim and Augmentin.  Please follow up with podiatry Dr. Excell Seltzer 1 week after discharge for wound check.  Please stay off your right foot.  Keep dressing on until you see Dr. Excell Seltzer. ? ?Please wait 2 days before resuming eliquis. ? ?For your left foot swelling and left toe redness, I have prescribed prednisone taper for presume gout.  Please take as directed. ? ?I have prescribed you 7 days of oral Lasix to help reducing some swelling in your legs.  Since you have chronic kidney disease, you need to follow up with outpatient provider to check labs before further refill for lasix. ? ? ?Dr. Darlin Priestly ?- ?-  ? No wound care   Complete by: As directed ?  ? ?  ? ?Hospital Course:  ?For full details, please see H&P, progress notes, consult notes and ancillary notes.  ?Briefly,  ?[name] [name] is a 81 y.o. male with medical history significant for CAD, chronic systolic CHF, morbid obesity, type 2 diabetes, hypertension, A-fib, who presented with pain and swelling of the right foot. ?  ?Patient reported that he has had progressive problems with his right foot for about the past month.  ED provider performed a bedside I&D of a large abscess with copious amounts of purulent material evacuated. ?  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 ?--Underwent FIRST RAY AMPUTATION on 4/17. ?--podiatry performed wound check and  dressing change prior to discharge.  Patient is to keep this clean, dry, and do not remove.  Recommended minimal walking on the right foot.  ?--Neither ED I/D cx, nor OR cx, grew anything prior to discharge.  Pt was therefore discharged on 10 days of Bactrim and Augmentin and to follow-up with Dr. Excell Seltzer in 1 week. ? ?Left foot swelling and left toe erythema ?--Pt reported symptoms similar to his previous gout flare.  Pt was discharged on prednisone taper for presumed gout clare. ?--Pt was also discharged on 7 days of Lasix 40 mg daily to reduce BLE and feet swelling.   ?  ?Chronic hyponatremia ?--likely chronic.  Na 133 in 2021.  Did not improve with IVF. ?  ?Transaminitis ?Unclear etiology.  Hepatitis panel neg.  Tylenol level wnl. ?  ?Type 2 diabetes mellitus with diabetic polyneuropathy, without long-term current use of insulin (HCC) ?--Well controlled.  A1c 7.0.  Not on home hypoglycemics. ?  ?Chronic systolic CHF (congestive heart failure) (HCC) ?--cont home coreg and Valsartan ?--was not taking Entresto PTA. ?  ?Morbid obesity (HCC) ?--BMI 35.52 with a serious health condition of diabetes ?  ?CKD 3A ?--Cr at baseline. ?  ?Sepsis ruled out ? ? ?Discharge Diagnoses:  ?Principal Problem: ?  Abscess of right foot ?Active Problems: ?  Umbilical hernia ?  Morbid obesity (HCC) ?  Hyperlipidemia ?  Hypertension ?  CAD (coronary artery disease) ?  Chronic systolic CHF (congestive heart failure) (HCC) ?  AAA (abdominal aortic aneurysm) (HCC) ?  Cardiomyopathy, ischemic ?  Type 2 diabetes mellitus with diabetic polyneuropathy, without long-term current use of insulin (HCC) ?  A-fib (HCC) ?  Staghorn calculus ?  Transaminitis ?  Chronic hyponatremia ? ? ?30 Day Unplanned Readmission Risk Score   ? ?Flowsheet Row ED to Hosp-Admission (Current) from 12/07/2021 in [facility] (1A)  ?30 Day Unplanned Readmission Risk Score (%) 18.54 Filed at 12/10/2021 1200  ? ?  ? ? This score is the patient's  risk of an unplanned readmission within 30 days of being discharged (0 -100%). The score is based on dignosis, age, lab data, medications, orders, and past utilization.   ?Low:  0-14.9   Medium: 15-21.9   High: 22-29.9   Extreme: 30 and above ? ?  ? ?  ? ? ?Discharge Instructions: ? ?Allergies as of 12/10/2021   ? ?   Reactions  ? Glimepiride   ? Unusual sweating and irregular heart beat  ? ?  ? ?  ?Medication List  ?  ? ?STOP taking these medications   ? ?clotrimazole-betamethasone cream ?Commonly known as: LOTRISONE ?  ?colchicine 0.6 MG tablet ?  ?collagenase 250 UNIT/GM ointment ?Commonly known as: SANTYL ?  ?povidone-iodine 10 % external solution ?Commonly known as: Betadine ?  ?sacubitril-valsartan 24-26 MG ?Commonly known as: ENTRESTO ?  ?SSD 1 % cream ?Generic drug: silver sulfADIAZINE ?  ? ?  ? ?TAKE these medications   ? ?acetaminophen 500 MG tablet ?Commonly known as: TYLENOL ?Take 500-1,000 mg by mouth every 6 (six) hours as needed for moderate pain. ?  ?amoxicillin-clavulanate 875-125 MG tablet ?Commonly known as: AUGMENTIN ?Take 1 tablet by mouth every 12 (twelve) hours for 10 days. ?  ?apixaban 5 MG Tabs tablet ?Commonly known as: Eliquis ?Take 1 tablet (5 mg total) by mouth 2 (two) times daily. ?  ?atorvastatin 40 MG tablet ?Commonly known as: LIPITOR ?TAKE 1 TABLET BY MOUTH ONCE DAILY AT 6 PM. ?  ?Bandage Roll 4.5"x4YD Misc ?1 units of lipase by Does not apply route daily. ?  ?carvedilol 12.5 MG tablet ?Commonly known as: COREG ?TAKE 1 TABLET BY MOUTH TWICE DAILY WITH A MEAL ?  ?diphenhydramine-acetaminophen 25-500 MG Tabs tablet ?Commonly known as: TYLENOL PM ?Take 1 tablet by mouth at bedtime as needed (sleep). ?  ?furosemide 40 MG tablet ?Commonly known as: LASIX ?Take 1 tablet (40 mg total) by mouth daily for 7 days. ?  ?gabapentin 300 MG capsule ?Commonly known as: NEURONTIN ?Take 300 mg by mouth 4 (four) times daily. ?  ?mexiletine 200 MG capsule ?Commonly known as: MEXITIL ?Take 200 mg by  mouth 4 (four) times daily. ?What changed: Another medication with the same name was removed. Continue taking this medication, and follow the directions you see here. ?  ?nitroGLYCERIN 0.4 MG SL tablet ?Commonly known as: NITROSTAT ?Place 1 tablet (0.4 mg total) under the tongue every 5 (five) minutes as needed for chest pain. ?  ?predniSONE 10 MG tablet ?Commonly known as: DELTASONE ?Take 40 mg (4 tablets) from 4/19 to 4/21, then 20 mg (2 tablets) from 4/22 to 4/24, then 10 mg (1 tablet) from 4/25 to 4/27, then done. ?  ?sulfamethoxazole-trimethoprim 800-160 MG tablet ?Commonly known as: BACTRIM DS ?Take 1 tablet by mouth every 12 (twelve) hours for 10 days. ?  ?traMADol 50 MG tablet ?Commonly known as: ULTRAM ?Take 1 tablet (50 mg total) by mouth every 6 (six) hours as needed for up to  5 days for moderate pain or severe pain (mild pain). ?  ?valsartan 40 MG tablet ?Commonly known as: DIOVAN ?Take 40 mg by mouth daily. ?  ? ?  ? ? ? Follow-up Information   ? ? [name], MD Follow up in 1 week(s).   ?Specialty: Family Medicine ?Contact information: ?[address] ?[facility] ?[place] [address] ?[phone] ? ? ?  ?  ? ? Rosetta Posner, DPM Follow up in 1 week(s).   ?Specialty: Podiatry ?Contact information: ?[address] ?[place] [address] ?[phone] ? ? ?  ?  ? ?  ?  ? ?  ? ? ?Allergies  ?Allergen Reactions  ? Glimepiride   ?  Unusual sweating and irregular heart beat  ? ? ? ?The results of significant diagnostics from this hospitalization (including imaging, microbiology, ancillary and laboratory) are listed below for reference.  ? ?Consultations: ? ? ?Procedures/Studies: ?MR FOOT RIGHT W WO CONTRAST ? ?Result Date: 12/08/2021 ?CLINICAL DATA:  Foot swelling, diabetic, osteomyelitis suspected, xray done osteomyelitis EXAM: MRI OF THE RIGHT FOREFOOT WITHOUT AND WITH CONTRAST TECHNIQUE: Multiplanar, multisequence MR imaging of the right forefoot was performed before and after the  administration of intravenous contrast. CONTRAST:  33mL GADAVIST GADOBUTROL 1 MMOL/ML IV SOLN COMPARISON:  Radiograph 12/07/2021. FINDINGS: Bones/Joint/Cartilage There is in erosion of the medial aspect of the firs

## 2021-12-11 LAB — SURGICAL PATHOLOGY

## 2021-12-12 LAB — CULTURE, BLOOD (ROUTINE X 2)
Culture: NO GROWTH
Culture: NO GROWTH
Specimen Description: ADEQUATE

## 2021-12-12 LAB — AEROBIC/ANAEROBIC CULTURE W GRAM STAIN (SURGICAL/DEEP WOUND): Culture: NO GROWTH

## 2021-12-12 NOTE — Anesthesia Postprocedure Evaluation (Signed)
Anesthesia Post Note ? ?Patient: [name] [name] ? ?Procedure(s) Performed: INCISION AND DRAINAGE (Right) ?AMPUTATION RAY-PARTIAL FIRST RAY AMPUTATION (Right) ? ?Patient location during evaluation: PACU ?Anesthesia Type: General ?Level of consciousness: awake and alert ?Pain management: pain level controlled ?Vital Signs Assessment: post-procedure vital signs reviewed and stable ?Respiratory status: spontaneous breathing, nonlabored ventilation, respiratory function stable and patient connected to nasal cannula oxygen ?Cardiovascular status: blood pressure returned to baseline and stable ?Postop Assessment: no apparent nausea or vomiting ?Anesthetic complications: no ? ? ?No notable events documented. ? ? ?Last Vitals:  ?Vitals:  ? 12/10/21 0410 12/10/21 0839  ?BP: 117/61 (!) 117/57  ?Pulse: 92 (!) 102  ?Resp: 20 18  ?Temp: 37.1 ?C 36.8 ?C  ?SpO2: 96% 95%  ?  ?Last Pain:  ?Vitals:  ? 12/10/21 1004  ?TempSrc:   ?PainSc: 0-No pain  ? ? ?  ?  ?  ?  ?  ?  ? ?Lenard Simmer ? ? ? ? ?

## 2021-12-13 ENCOUNTER — Emergency Department: Payer: PPO

## 2021-12-13 ENCOUNTER — Emergency Department
Admission: EM | Admit: 2021-12-13 | Discharge: 2021-12-13 | Discharge status: Against Medical Advice | Payer: PPO | Attending: Emergency Medicine | Admitting: Emergency Medicine

## 2021-12-13 DIAGNOSIS — R0602 Shortness of breath: Secondary | ICD-10-CM | POA: Diagnosis not present

## 2021-12-13 DIAGNOSIS — Z5321 Procedure and treatment not carried out due to patient leaving prior to being seen by health care provider: Secondary | ICD-10-CM | POA: Insufficient documentation

## 2021-12-13 DIAGNOSIS — R2243 Localized swelling, mass and lump, lower limb, bilateral: Secondary | ICD-10-CM | POA: Diagnosis not present

## 2021-12-13 DIAGNOSIS — R609 Edema, unspecified: Secondary | ICD-10-CM | POA: Diagnosis not present

## 2021-12-13 LAB — CBC
HCT: 39.7 % (ref 39.0–52.0)
Hemoglobin: 12.2 g/dL — ABNORMAL LOW (ref 13.0–17.0)
MCH: 26.8 pg (ref 26.0–34.0)
MCHC: 30.7 g/dL (ref 30.0–36.0)
MCV: 87.1 fL (ref 80.0–100.0)
Platelets: 482 10*3/uL — ABNORMAL HIGH (ref 150–400)
RBC: 4.56 MIL/uL (ref 4.22–5.81)
RDW: 13.7 % (ref 11.5–15.5)
WBC: 12.1 10*3/uL — ABNORMAL HIGH (ref 4.0–10.5)
nRBC: 0 % (ref 0.0–0.2)

## 2021-12-13 LAB — BASIC METABOLIC PANEL
Anion gap: 9 (ref 5–15)
BUN: 31 mg/dL — ABNORMAL HIGH (ref 8–23)
CO2: 26 mmol/L (ref 22–32)
Calcium: 9 mg/dL (ref 8.9–10.3)
Chloride: 99 mmol/L (ref 98–111)
Creatinine, Ser: 1.29 mg/dL — ABNORMAL HIGH (ref 0.61–1.24)
GFR, Estimated: 57 mL/min — ABNORMAL LOW (ref >60)
Glucose, Bld: 283 mg/dL — ABNORMAL HIGH (ref 70–99)
Potassium: 4.8 mmol/L (ref 3.5–5.1)
Sodium: 134 mmol/L — ABNORMAL LOW (ref 135–145)

## 2021-12-13 IMAGING — CR DG CHEST 2V
2 series · 2 of 2 positions shown · non-contrast
Comparison: Prior chest radiographs [DATE] and earlier. Chest
CT [DATE].

CLINICAL DATA: Shortness of breath.

EXAM:
CHEST - 2 VIEW

[chest pa]
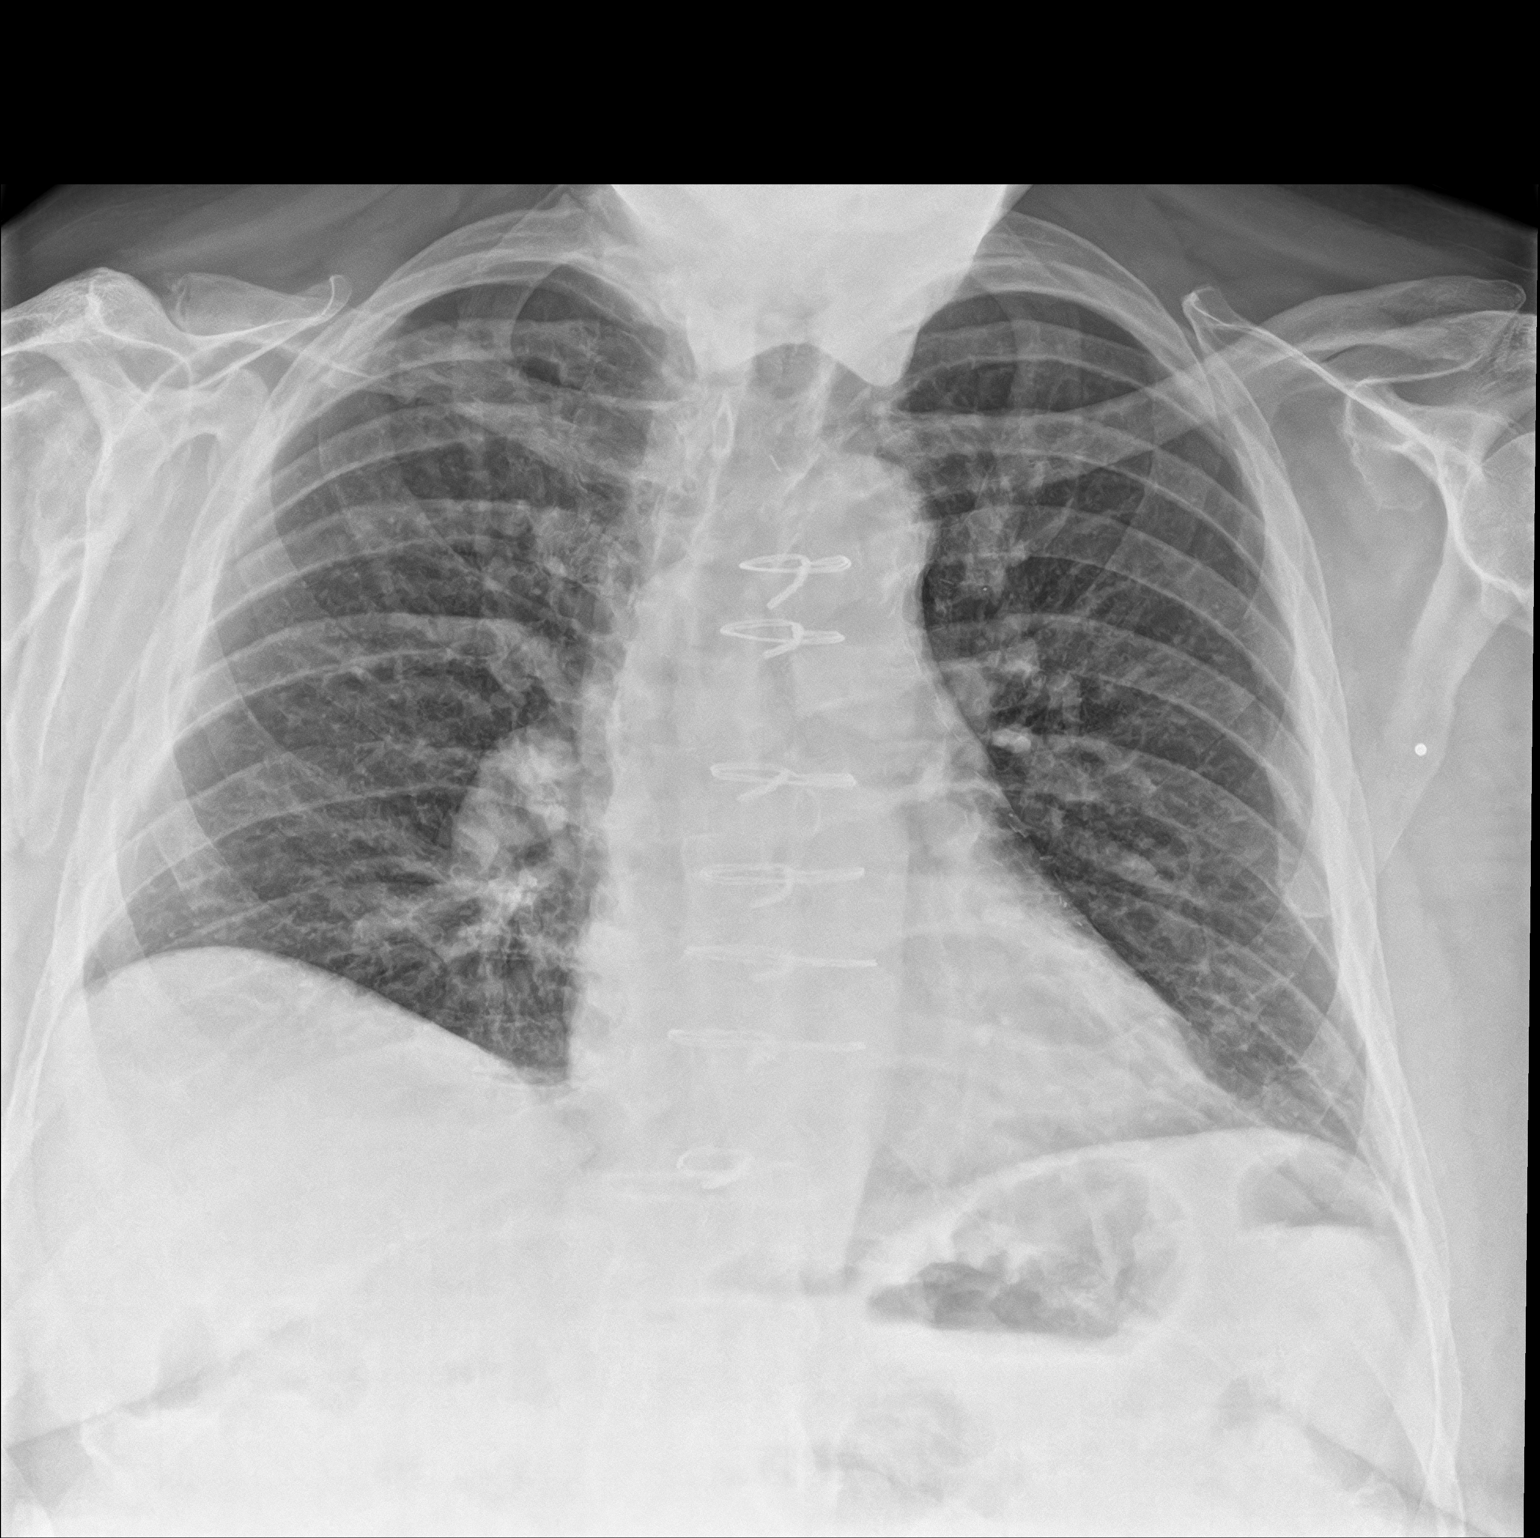

[chest lat]
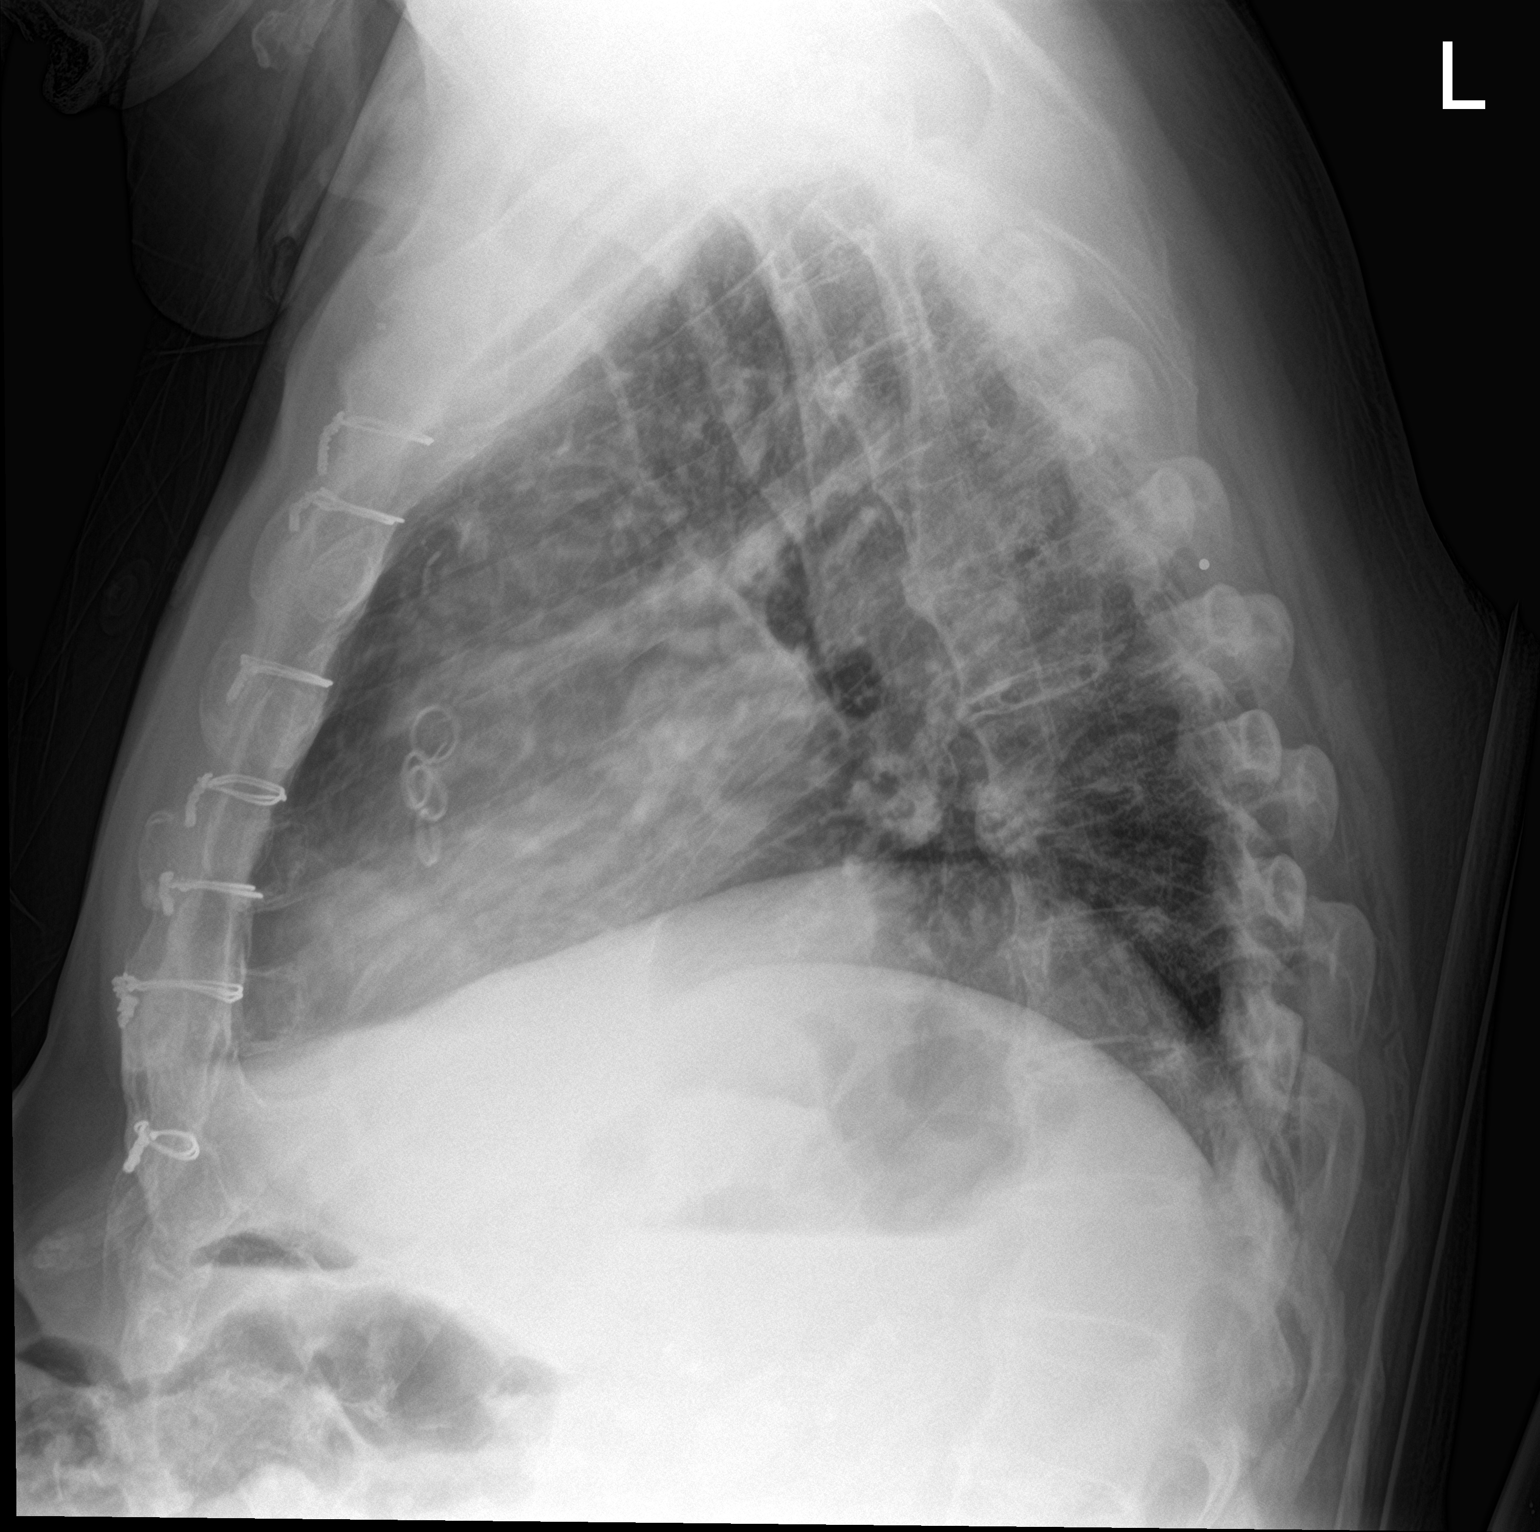

[2 of 2 positions shown; findings below may reference images not displayed]

FINDINGS: Prior median sternotomy/CABG. Heart size within normal limits.
Aortic atherosclerosis. No appreciable airspace consolidation or
pulmonary edema. No evidence of pleural effusion or pneumothorax. No
acute bony abnormality identified. Degenerative changes of the
spine. Redemonstrated 2-3 mm round metallic foreign body, which
appears embedded within the left back soft tissues.
IMPRESSION: No evidence of acute cardiopulmonary abnormality.

Aortic Atherosclerosis ([49]-[49]).

Redemonstrated 2-3 mm round metallic foreign body, which appears
embedded within the left back soft tissues.

## 2021-12-13 NOTE — ED Triage Notes (Signed)
Patient to ER via EMS from home with complaints of bilateral leg swelling. Reports he started noticing it this afternoon. Patient unsure if he takes diuretic. Denies any shortness of breath. Dressing present to right foot.  ?

## 2021-12-13 NOTE — ED Triage Notes (Signed)
Pt comes into the ED via ems from home with c/o BL LE swelling. Partial amputation of a toe on the right foot on Monday, PCP instructed the pt to comes to the ED for evaluation ?

## 2021-12-15 LAB — AEROBIC/ANAEROBIC CULTURE W GRAM STAIN (SURGICAL/DEEP WOUND): Culture: NO GROWTH

## 2021-12-18 DIAGNOSIS — M86671 Other chronic osteomyelitis, right ankle and foot: Secondary | ICD-10-CM | POA: Diagnosis not present

## 2021-12-18 DIAGNOSIS — Z8739 Personal history of other diseases of the musculoskeletal system and connective tissue: Secondary | ICD-10-CM | POA: Diagnosis not present

## 2021-12-18 DIAGNOSIS — B351 Tinea unguium: Secondary | ICD-10-CM | POA: Diagnosis not present

## 2021-12-18 DIAGNOSIS — Z09 Encounter for follow-up examination after completed treatment for conditions other than malignant neoplasm: Secondary | ICD-10-CM | POA: Diagnosis not present

## 2021-12-18 DIAGNOSIS — Z6838 Body mass index (BMI) 38.0-38.9, adult: Secondary | ICD-10-CM | POA: Diagnosis not present

## 2021-12-18 DIAGNOSIS — M7989 Other specified soft tissue disorders: Secondary | ICD-10-CM | POA: Diagnosis not present

## 2021-12-18 DIAGNOSIS — Z8679 Personal history of other diseases of the circulatory system: Secondary | ICD-10-CM | POA: Diagnosis not present

## 2021-12-18 DIAGNOSIS — E1142 Type 2 diabetes mellitus with diabetic polyneuropathy: Secondary | ICD-10-CM | POA: Diagnosis not present

## 2021-12-18 DIAGNOSIS — I739 Peripheral vascular disease, unspecified: Secondary | ICD-10-CM | POA: Diagnosis not present

## 2021-12-23 DIAGNOSIS — I25118 Atherosclerotic heart disease of native coronary artery with other forms of angina pectoris: Secondary | ICD-10-CM | POA: Diagnosis not present

## 2021-12-23 DIAGNOSIS — W19XXXA Unspecified fall, initial encounter: Secondary | ICD-10-CM | POA: Diagnosis not present

## 2021-12-23 DIAGNOSIS — R600 Localized edema: Secondary | ICD-10-CM | POA: Diagnosis not present

## 2021-12-23 DIAGNOSIS — S0990XA Unspecified injury of head, initial encounter: Secondary | ICD-10-CM | POA: Diagnosis not present

## 2021-12-23 DIAGNOSIS — N1831 Chronic kidney disease, stage 3a: Secondary | ICD-10-CM | POA: Diagnosis not present

## 2021-12-23 DIAGNOSIS — M25562 Pain in left knee: Secondary | ICD-10-CM | POA: Diagnosis not present

## 2021-12-25 ENCOUNTER — Emergency Department: Payer: PPO

## 2021-12-25 ENCOUNTER — Encounter: Payer: Self-pay | Admitting: Emergency Medicine

## 2021-12-25 ENCOUNTER — Emergency Department
Admission: EM | Admit: 2021-12-25 | Discharge: 2021-12-25 | Disposition: A | Discharge status: Home / Self Care | Payer: PPO | Attending: Emergency Medicine | Admitting: Emergency Medicine

## 2021-12-25 ENCOUNTER — Other Ambulatory Visit: Payer: Self-pay

## 2021-12-25 DIAGNOSIS — Z6838 Body mass index (BMI) 38.0-38.9, adult: Secondary | ICD-10-CM | POA: Diagnosis not present

## 2021-12-25 DIAGNOSIS — L03116 Cellulitis of left lower limb: Secondary | ICD-10-CM | POA: Diagnosis not present

## 2021-12-25 DIAGNOSIS — I110 Hypertensive heart disease with heart failure: Secondary | ICD-10-CM | POA: Insufficient documentation

## 2021-12-25 DIAGNOSIS — I509 Heart failure, unspecified: Secondary | ICD-10-CM | POA: Insufficient documentation

## 2021-12-25 DIAGNOSIS — M25562 Pain in left knee: Secondary | ICD-10-CM | POA: Diagnosis not present

## 2021-12-25 DIAGNOSIS — S8002XA Contusion of left knee, initial encounter: Secondary | ICD-10-CM | POA: Diagnosis not present

## 2021-12-25 DIAGNOSIS — E1142 Type 2 diabetes mellitus with diabetic polyneuropathy: Secondary | ICD-10-CM | POA: Diagnosis not present

## 2021-12-25 DIAGNOSIS — E119 Type 2 diabetes mellitus without complications: Secondary | ICD-10-CM | POA: Insufficient documentation

## 2021-12-25 DIAGNOSIS — W19XXXA Unspecified fall, initial encounter: Secondary | ICD-10-CM | POA: Insufficient documentation

## 2021-12-25 DIAGNOSIS — M86671 Other chronic osteomyelitis, right ankle and foot: Secondary | ICD-10-CM | POA: Diagnosis not present

## 2021-12-25 DIAGNOSIS — I2510 Atherosclerotic heart disease of native coronary artery without angina pectoris: Secondary | ICD-10-CM | POA: Diagnosis not present

## 2021-12-25 DIAGNOSIS — Z8679 Personal history of other diseases of the circulatory system: Secondary | ICD-10-CM | POA: Diagnosis not present

## 2021-12-25 DIAGNOSIS — S8992XA Unspecified injury of left lower leg, initial encounter: Secondary | ICD-10-CM | POA: Diagnosis present

## 2021-12-25 DIAGNOSIS — I739 Peripheral vascular disease, unspecified: Secondary | ICD-10-CM | POA: Diagnosis not present

## 2021-12-25 DIAGNOSIS — B351 Tinea unguium: Secondary | ICD-10-CM | POA: Diagnosis not present

## 2021-12-25 DIAGNOSIS — Z8739 Personal history of other diseases of the musculoskeletal system and connective tissue: Secondary | ICD-10-CM | POA: Diagnosis not present

## 2021-12-25 DIAGNOSIS — M7989 Other specified soft tissue disorders: Secondary | ICD-10-CM | POA: Diagnosis not present

## 2021-12-25 IMAGING — CR DG KNEE COMPLETE 4+V*L*
4 series · 4 of 4 positions shown · non-contrast
Comparison: None available

CLINICAL DATA: Fall.  Pain.

EXAM:
LEFT KNEE - COMPLETE 4+ VIEW

[knee ap]
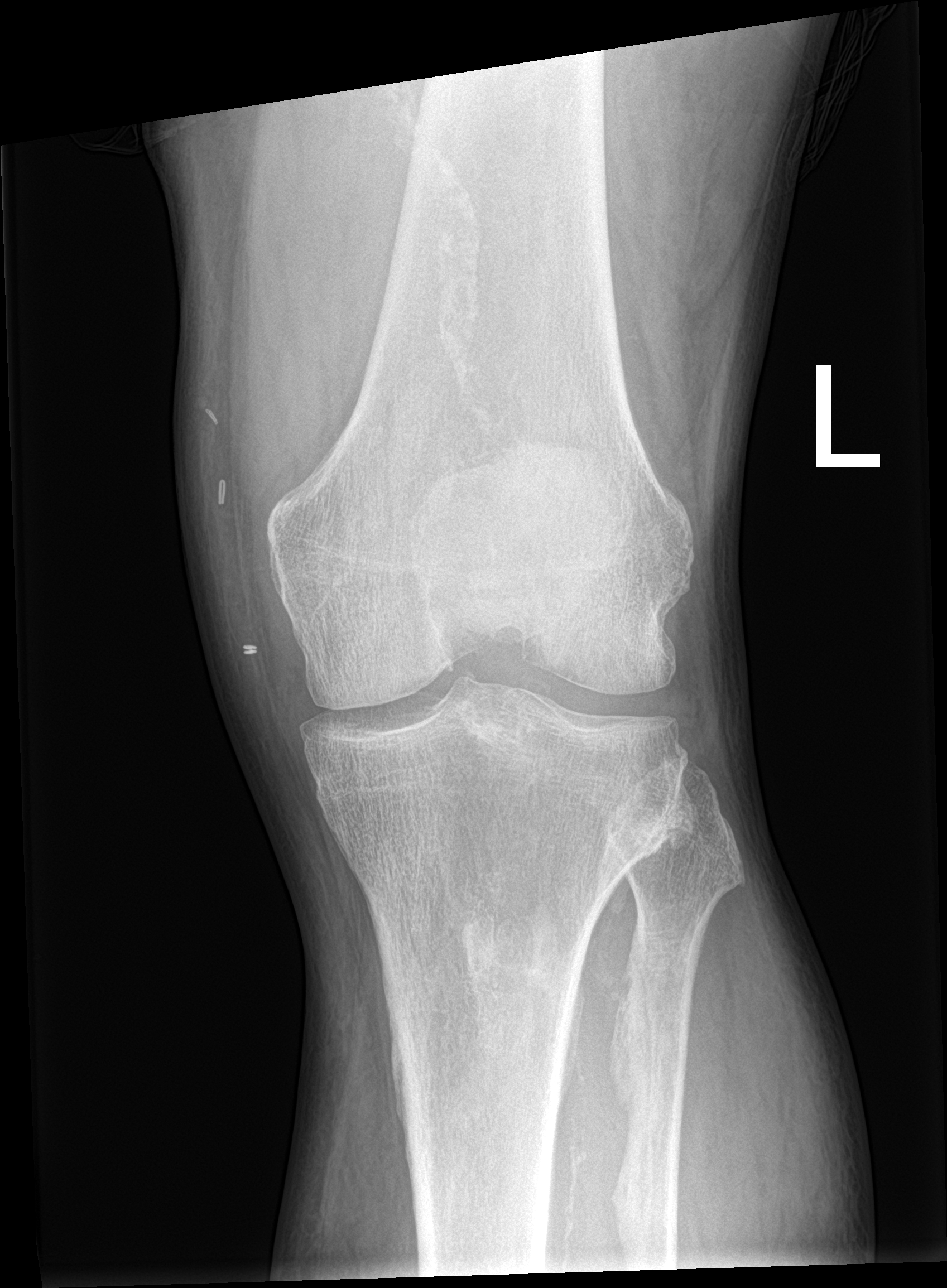

[knee obl (1 of 2)]
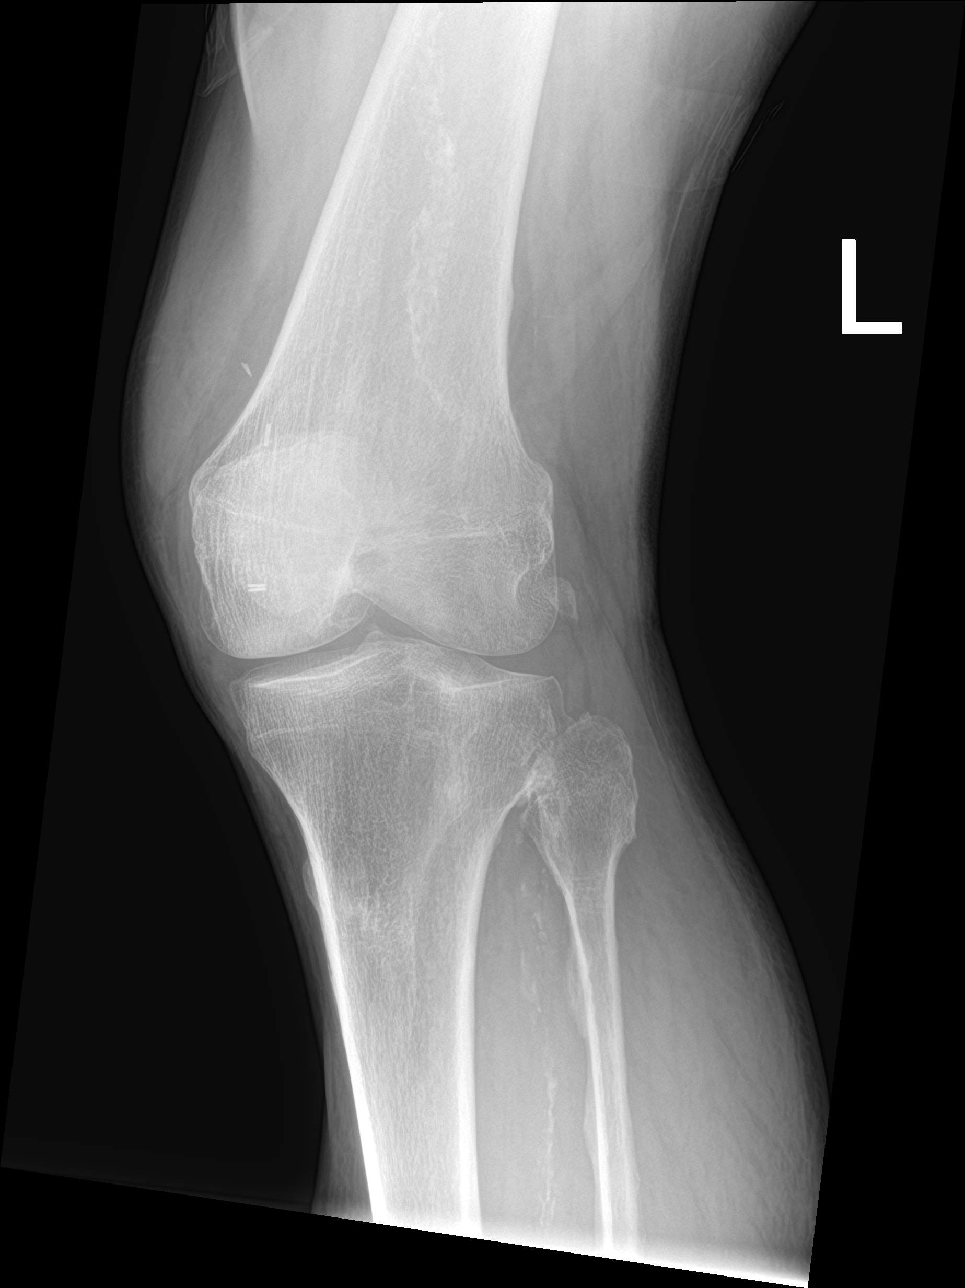

[knee obl (2 of 2)]
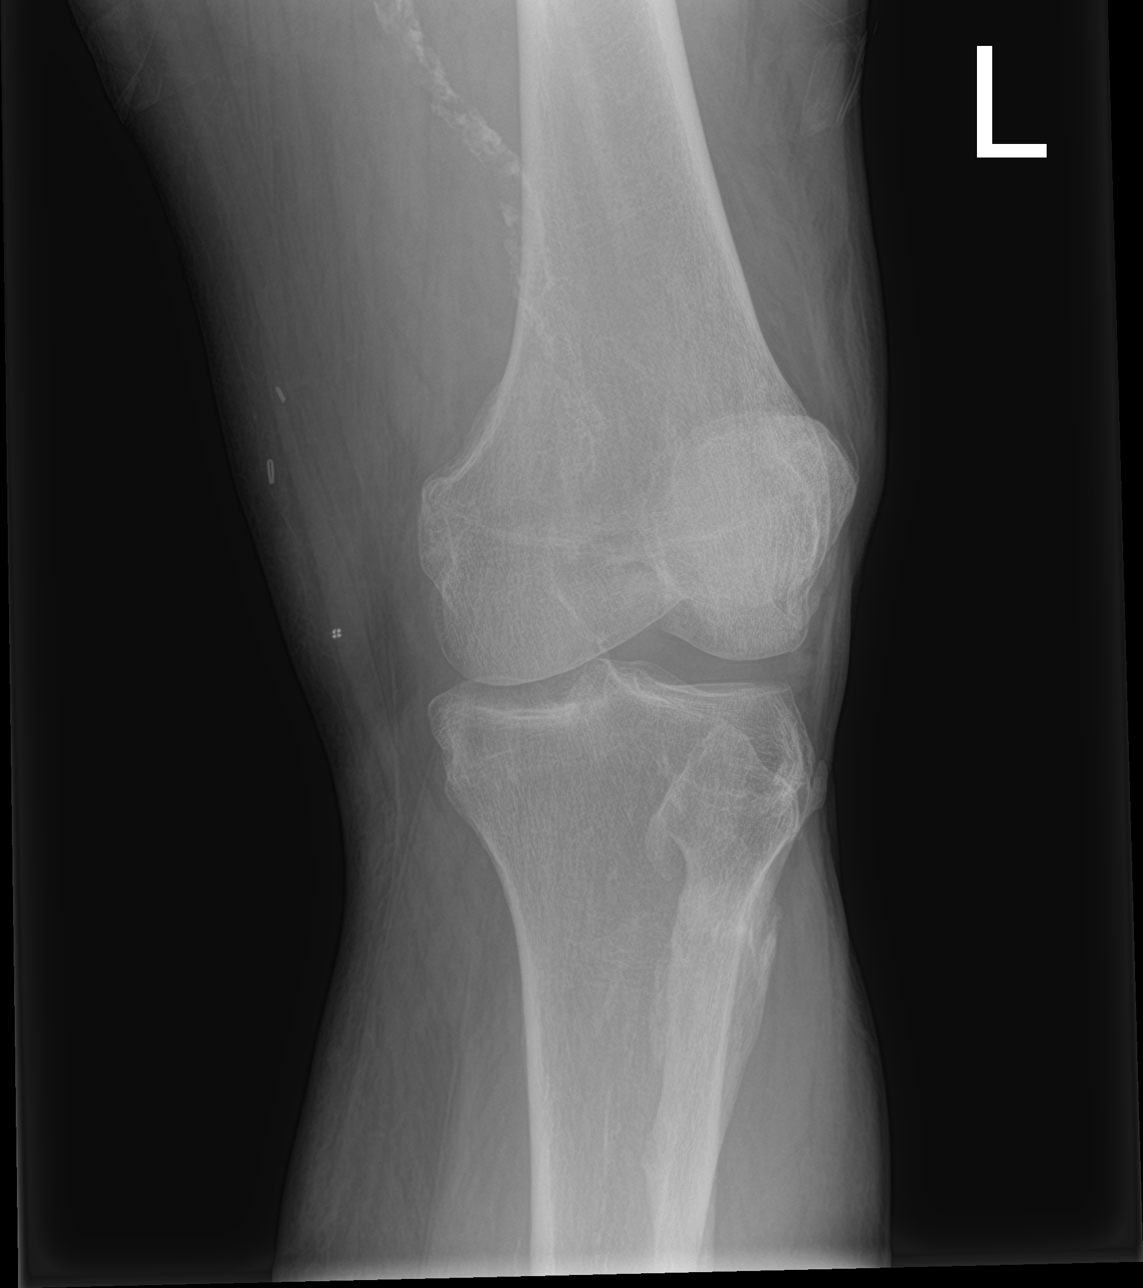

[knee lat]
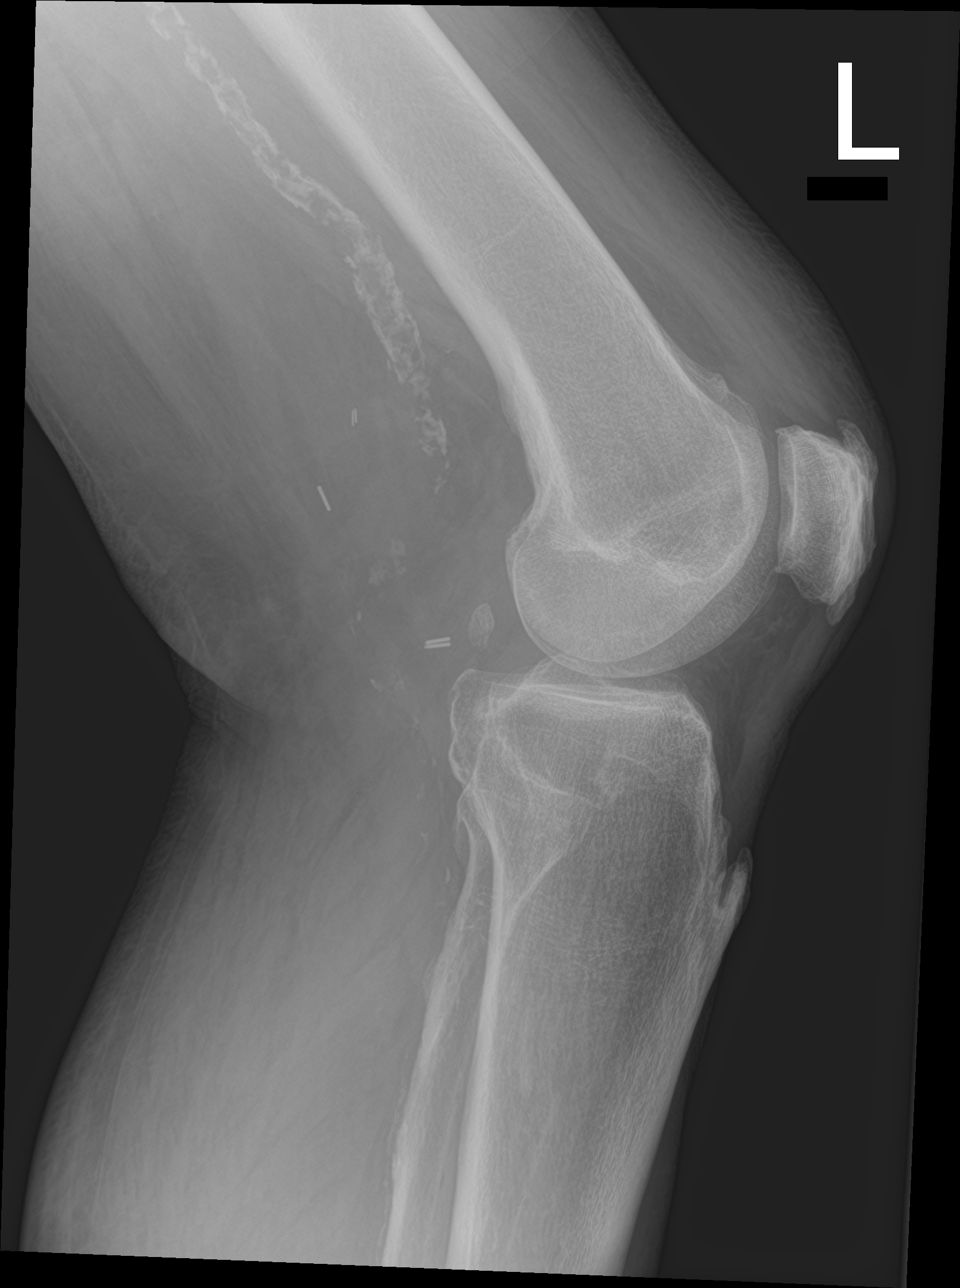

[4 of 4 positions shown; findings below may reference images not displayed]

FINDINGS: Mild medial compartment joint space narrowing. Mild patellofemoral
joint space narrowing and peripheral osteophytosis. Mild chronic
enthesopathic spurring at the quadriceps insertion on the patella.
Moderate chronic spurring at the superior tibial tubercle at the
patellar tendon insertion. Surgical staples overlie the
posteromedial aspect of the distal femoral soft tissues. No acute
fracture is seen. No dislocation. No joint effusion. Moderately
dense vascular calcifications.
IMPRESSION: Mild medial and patellofemoral compartment osteoarthritis.

No acute fracture.

## 2021-12-25 MED ORDER — NAPROXEN 500 MG PO TABS
500.0000 mg | ORAL_TABLET | Freq: Once | ORAL | Status: AC
Start: 2021-12-25 — End: 2021-12-25
  Administered 2021-12-25: 500 mg via ORAL
  Filled 2021-12-25: qty 1

## 2021-12-25 MED ORDER — TRAMADOL HCL 50 MG PO TABS: 50.0000 mg | ORAL_TABLET | Freq: Four times a day (QID) | ORAL | 0 refills | Status: AC | PRN

## 2021-12-25 NOTE — ED Provider Notes (Signed)
? ?  [facility] ?Provider Note ? ? ? Event Date/Time  ? First MD Initiated Contact with Patient 12/25/21 1219   ?  (approximate) ? ? ?History  ? ?Left knee pain ? ? ?HPI ? ?[name] [name] is a 81 y.o. male with history of CAD, diabetes, CHF, hypertension who presents after mechanical fall.  Patient reports he fell 3 days ago onto his left knee.  He has been having continued pain in his left knee which started about 12 hours after the fall.  Has been using IcyHot without improvement. ? ?  ? ? ?Physical Exam  ? ?Triage Vital Signs: ?ED Triage Vitals  ?Enc Vitals Group  ?   BP 12/25/21 1115 122/63  ?   Pulse Rate 12/25/21 1115 82  ?   Resp 12/25/21 1115 18  ?   Temp 12/25/21 1115 97.6 ?F (36.4 ?C)  ?   Temp Source 12/25/21 1115 Oral  ?   SpO2 12/25/21 1115 97 %  ?   Weight 12/25/21 1109 112.4 kg (247 lb 12.8 oz)  ?   Height 12/25/21 1109 1.753 m (5\' 9" )  ?   Head Circumference --   ?   Peak Flow --   ?   Pain Score 12/25/21 1108 8  ?   Pain Loc --   ?   Pain Edu? --   ?   Excl. in [place]? --   ? ? ?Most recent vital signs: ?Vitals:  ? 12/25/21 1115 12/25/21 1242  ?BP: 122/63 127/66  ?Pulse: 82 88  ?Resp: 18 17  ?Temp: 97.6 ?F (36.4 ?C) 98 ?F (36.7 ?C)  ?SpO2: 97% 98%  ? ? ? ?General: Awake, no distress.  ?CV:  Good peripheral perfusion.  ?Resp:  Normal effort.  ?Abd:  No distention.  ?Other:  Left knee exam: No erythema or swelling or effusion, ? ? ?ED Results / Procedures / Treatments  ? ?Labs ?(all labs ordered are listed, but only abnormal results are displayed) ?Labs Reviewed - No data to display ? ? ?EKG ? ? ? ? ?RADIOLOGY ? ?Knee x-ray reviewed by me, no acute fracture ? ? ?PROCEDURES: ? ?Critical Care performed:  ? ?Procedures ? ? ?MEDICATIONS ORDERED IN ED: ?Medications  ?naproxen (NAPROSYN) tablet 500 mg (500 mg Oral Given 12/25/21 1240)  ? ? ? ?IMPRESSION / MDM / ASSESSMENT AND PLAN / ED COURSE  ?I reviewed the triage vital signs and the nursing notes. ? ? ? ?Patient presents after  mechanical fall as detailed above.  Pain isolated to the left knee. ? ?Exam is overall reassuring, patient is able to ambulate with a walker with some difficulty.  Has not take anything for this besides Tylenol. ? ?X-ray reviewed and is negative for acute fracture, will provide analgesia, outpatient follow-up with Ortho as needed. ? ? ? ?  ? ? ?FINAL CLINICAL IMPRESSION(S) / ED DIAGNOSES  ? ?Final diagnoses:  ?Contusion of left knee, initial encounter  ? ? ? ?Rx / DC Orders  ? ?ED Discharge Orders   ? ?      Ordered  ?  traMADol (ULTRAM) 50 MG tablet  Every 6 hours PRN       ? 12/25/21 1237  ? ?  ?  ? ?  ? ? ? ?Note:  This document was prepared using Dragon voice recognition software and may include unintentional dictation errors. ?  ?Lavonia Drafts, MD ?12/25/21 1255 ? ?

## 2021-12-25 NOTE — ED Triage Notes (Signed)
First Nurse Note:  Arrives from [facility], "because they cannot help me". Patient states he fell on Sunday, c/o left knee pain. ?

## 2022-01-03 DIAGNOSIS — I739 Peripheral vascular disease, unspecified: Secondary | ICD-10-CM | POA: Diagnosis not present

## 2022-01-03 DIAGNOSIS — E1142 Type 2 diabetes mellitus with diabetic polyneuropathy: Secondary | ICD-10-CM | POA: Diagnosis not present

## 2022-01-03 DIAGNOSIS — Z6838 Body mass index (BMI) 38.0-38.9, adult: Secondary | ICD-10-CM | POA: Diagnosis not present

## 2022-01-03 DIAGNOSIS — B351 Tinea unguium: Secondary | ICD-10-CM | POA: Diagnosis not present

## 2022-01-03 DIAGNOSIS — Z91199 Patient's noncompliance with other medical treatment and regimen due to unspecified reason: Secondary | ICD-10-CM | POA: Diagnosis not present

## 2022-01-03 DIAGNOSIS — Z8679 Personal history of other diseases of the circulatory system: Secondary | ICD-10-CM | POA: Diagnosis not present

## 2022-01-03 DIAGNOSIS — M7989 Other specified soft tissue disorders: Secondary | ICD-10-CM | POA: Diagnosis not present

## 2022-01-03 DIAGNOSIS — M86671 Other chronic osteomyelitis, right ankle and foot: Secondary | ICD-10-CM | POA: Diagnosis not present

## 2022-01-03 DIAGNOSIS — Z8739 Personal history of other diseases of the musculoskeletal system and connective tissue: Secondary | ICD-10-CM | POA: Diagnosis not present

## 2022-01-03 DIAGNOSIS — Z09 Encounter for follow-up examination after completed treatment for conditions other than malignant neoplasm: Secondary | ICD-10-CM | POA: Diagnosis not present

## 2022-02-03 DIAGNOSIS — M79674 Pain in right toe(s): Secondary | ICD-10-CM | POA: Diagnosis not present

## 2022-02-03 DIAGNOSIS — B351 Tinea unguium: Secondary | ICD-10-CM | POA: Diagnosis not present

## 2022-02-03 DIAGNOSIS — M79675 Pain in left toe(s): Secondary | ICD-10-CM | POA: Diagnosis not present

## 2022-02-06 DIAGNOSIS — E1159 Type 2 diabetes mellitus with other circulatory complications: Secondary | ICD-10-CM | POA: Diagnosis not present

## 2022-02-06 DIAGNOSIS — I10 Essential (primary) hypertension: Secondary | ICD-10-CM | POA: Diagnosis not present

## 2022-02-06 DIAGNOSIS — Z125 Encounter for screening for malignant neoplasm of prostate: Secondary | ICD-10-CM | POA: Diagnosis not present

## 2022-02-22 ENCOUNTER — Emergency Department: Payer: PPO

## 2022-02-22 ENCOUNTER — Encounter: Payer: Self-pay | Admitting: Emergency Medicine

## 2022-02-22 ENCOUNTER — Emergency Department
Admission: EM | Admit: 2022-02-22 | Discharge: 2022-02-22 | Disposition: A | Discharge status: Home / Self Care | Payer: PPO | Attending: Emergency Medicine | Admitting: Emergency Medicine

## 2022-02-22 ENCOUNTER — Other Ambulatory Visit: Payer: Self-pay

## 2022-02-22 DIAGNOSIS — G8929 Other chronic pain: Secondary | ICD-10-CM | POA: Diagnosis not present

## 2022-02-22 DIAGNOSIS — I2510 Atherosclerotic heart disease of native coronary artery without angina pectoris: Secondary | ICD-10-CM | POA: Diagnosis not present

## 2022-02-22 DIAGNOSIS — M545 Low back pain, unspecified: Secondary | ICD-10-CM | POA: Diagnosis not present

## 2022-02-22 DIAGNOSIS — I110 Hypertensive heart disease with heart failure: Secondary | ICD-10-CM | POA: Insufficient documentation

## 2022-02-22 DIAGNOSIS — I4891 Unspecified atrial fibrillation: Secondary | ICD-10-CM | POA: Insufficient documentation

## 2022-02-22 DIAGNOSIS — M5459 Other low back pain: Secondary | ICD-10-CM | POA: Diagnosis not present

## 2022-02-22 DIAGNOSIS — E119 Type 2 diabetes mellitus without complications: Secondary | ICD-10-CM | POA: Diagnosis not present

## 2022-02-22 DIAGNOSIS — I509 Heart failure, unspecified: Secondary | ICD-10-CM | POA: Diagnosis not present

## 2022-02-22 MED ORDER — METHOCARBAMOL 500 MG PO TABS: 500.0000 mg | ORAL_TABLET | Freq: Three times a day (TID) | ORAL | 0 refills | Status: DC | PRN

## 2022-02-22 NOTE — ED Provider Notes (Signed)
[facility] Provider Note    Event Date/Time   First MD Initiated Contact with Patient 02/22/22 [phone]     (approximate)   History   Back Pain   HPI  [name] [name] is a 81 y.o. male with a history of HTN, hyperlipidemia with CAD, CHF, cardiomyopathy, DM 2 with neuropathy, A-fib and chronic low back pain presents to the ER today with complaint of worsening low back pain.  He reports this started 2 and half months ago after a fall.  He was at the sink washing dishes when he fell on his left side.  He initially saw his PCP for the same but reports his PCP did not do anything for him.  He describes the pain as sharp and stabbing.  The pain radiates into his left hip.  He denies numbness or tingling in his left lower extremity but does report some weakness.  He denies loss of bowel or bladder control.  He reports he is taking Tylenol, Gabapentin and Tramadol as prescribed with minimal relief of symptoms.  He would like an x-ray for further evaluation today.      Physical Exam   Triage Vital Signs: ED Triage Vitals  Enc Vitals Group     BP 02/22/22 0740 125/73     Pulse Rate 02/22/22 0740 87     Resp 02/22/22 0740 20     Temp 02/22/22 0740 98 F (36.7 C)     Temp Source 02/22/22 0740 Oral     SpO2 02/22/22 0740 97 %     Weight 02/22/22 0738 246 lb 14.6 oz (112 kg)     Height 02/22/22 0738 5\' 9"  (1.753 m)     Head Circumference --      Peak Flow --      Pain Score 02/22/22 0737 8     Pain Loc --      Pain Edu? --      Excl. in [place]? --     Most recent vital signs: Vitals:   02/22/22 0740  BP: 125/73  Pulse: 87  Resp: 20  Temp: 98 F (36.7 C)  SpO2: 97%     General: Awake, appears in pain but in NAD. CV:  Irregular, no murmur noted. Resp:  CTA bilaterally. MSK:  Decreased flexion, extension and rotation of the spine secondary to pain.  No pain with palpation over the lumbar spine or left SI joint.  Strength 4/5 LLE, 5/5 RLE.  He has  difficulty getting from a sitting to a standing position.  Gait slow but steady with use of cane and one-person assist.   ED Results / Procedures / Treatments    RADIOLOGY  Imaging Orders         DG Lumbar Spine Complete    IMPRESSION:  1. No acute lumbar spine findings identified.  2. 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  3.  Aortic Atherosclerosis (ICD10-I70.0).    MEDICATIONS ORDERED IN ED: Medications - No data to display   IMPRESSION / MDM / ASSESSMENT AND PLAN / ED COURSE  I reviewed the triage vital signs and the nursing notes.  Acute on Chronic Low Back Pain s/p Fall:  Differential diagnosis includes, but is not limited to, osteoarthritis of the lumbar spine, lumbar compression fracture, sciatica  Patient's presentation is most consistent with exacerbation of chronic illness.  Xray lumbar spine does not show any acute findings per my interpretation, confirmed by radiology Advised him to  continue Tylenol, gabapentin and tramadol as previously prescribed Rx for Methocarbamol 500 mg every 8 hours as needed-sedation caution given Offered referral to physiatry for follow-up on his chronic back pain but he declines at this time Encourage stretching, heat and massage as this may be helpful He will follow-up with his PCP if symptoms persist or worsen.    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    Lorre Munroe, NP 02/22/22 9371    Merwyn Katos, MD 02/22/22 1003

## 2022-02-22 NOTE — ED Triage Notes (Signed)
Pt reports had a bad fall about 2 months ago and hurt his back. Pt reports saw his PMD but nothing was really done and he continues to have back pain. Pt reports pain is to lower back and he needs some x-rays to see what is going on. Denies new injuries

## 2022-02-22 NOTE — Discharge Instructions (Addendum)
You were seen today for left-sided low back pain.  Your x-ray does not show any acute fracture or abnormality.  You do have a bilateral pars defect of your L5 which has been a chronic issue for you.  According to the x-ray this is unchanged.  You can continue Tylenol OTC as needed for pain.  You should be taking gabapentin and tramadol as previously prescribed to help with your pain.  I am prescribing you a muscle relaxer to take every 8 hours as needed.  Please be aware that this may cause sedation and can lead to falls.  Please follow-up with your PCP if symptoms persist or worsen.

## 2022-02-22 NOTE — ED Notes (Signed)
See triage note. Lower back pain, has seen PCP, requesting imaging studies. Fell 2 months ago.

## 2022-03-07 DIAGNOSIS — E119 Type 2 diabetes mellitus without complications: Secondary | ICD-10-CM | POA: Diagnosis not present

## 2022-03-07 DIAGNOSIS — H5203 Hypermetropia, bilateral: Secondary | ICD-10-CM | POA: Diagnosis not present

## 2022-03-07 DIAGNOSIS — H524 Presbyopia: Secondary | ICD-10-CM | POA: Diagnosis not present

## 2022-03-07 DIAGNOSIS — H2513 Age-related nuclear cataract, bilateral: Secondary | ICD-10-CM | POA: Diagnosis not present

## 2022-03-07 DIAGNOSIS — H52223 Regular astigmatism, bilateral: Secondary | ICD-10-CM | POA: Diagnosis not present

## 2022-03-07 DIAGNOSIS — Z7984 Long term (current) use of oral hypoglycemic drugs: Secondary | ICD-10-CM | POA: Diagnosis not present

## 2022-03-20 DIAGNOSIS — I2510 Atherosclerotic heart disease of native coronary artery without angina pectoris: Secondary | ICD-10-CM | POA: Diagnosis not present

## 2022-03-20 DIAGNOSIS — I480 Paroxysmal atrial fibrillation: Secondary | ICD-10-CM | POA: Diagnosis not present

## 2022-03-20 DIAGNOSIS — E785 Hyperlipidemia, unspecified: Secondary | ICD-10-CM | POA: Diagnosis not present

## 2022-03-20 DIAGNOSIS — I255 Ischemic cardiomyopathy: Secondary | ICD-10-CM | POA: Diagnosis not present

## 2022-03-20 DIAGNOSIS — Z789 Other specified health status: Secondary | ICD-10-CM | POA: Diagnosis not present

## 2022-03-20 DIAGNOSIS — I10 Essential (primary) hypertension: Secondary | ICD-10-CM | POA: Diagnosis not present

## 2022-03-20 DIAGNOSIS — I493 Ventricular premature depolarization: Secondary | ICD-10-CM | POA: Diagnosis not present

## 2022-03-20 DIAGNOSIS — E1159 Type 2 diabetes mellitus with other circulatory complications: Secondary | ICD-10-CM | POA: Diagnosis not present

## 2022-03-20 DIAGNOSIS — I5022 Chronic systolic (congestive) heart failure: Secondary | ICD-10-CM | POA: Diagnosis not present

## 2022-04-01 DIAGNOSIS — I255 Ischemic cardiomyopathy: Secondary | ICD-10-CM | POA: Diagnosis not present

## 2022-04-01 DIAGNOSIS — I480 Paroxysmal atrial fibrillation: Secondary | ICD-10-CM | POA: Diagnosis not present

## 2022-04-01 DIAGNOSIS — I5022 Chronic systolic (congestive) heart failure: Secondary | ICD-10-CM | POA: Diagnosis not present

## 2022-04-01 DIAGNOSIS — I2510 Atherosclerotic heart disease of native coronary artery without angina pectoris: Secondary | ICD-10-CM | POA: Diagnosis not present

## 2022-04-07 DIAGNOSIS — I5022 Chronic systolic (congestive) heart failure: Secondary | ICD-10-CM | POA: Diagnosis not present

## 2022-04-07 DIAGNOSIS — I255 Ischemic cardiomyopathy: Secondary | ICD-10-CM | POA: Diagnosis not present

## 2022-04-07 DIAGNOSIS — I2510 Atherosclerotic heart disease of native coronary artery without angina pectoris: Secondary | ICD-10-CM | POA: Diagnosis not present

## 2022-04-07 DIAGNOSIS — I480 Paroxysmal atrial fibrillation: Secondary | ICD-10-CM | POA: Diagnosis not present

## 2022-04-07 DIAGNOSIS — E1159 Type 2 diabetes mellitus with other circulatory complications: Secondary | ICD-10-CM | POA: Diagnosis not present

## 2022-04-07 DIAGNOSIS — I10 Essential (primary) hypertension: Secondary | ICD-10-CM | POA: Diagnosis not present

## 2022-04-07 DIAGNOSIS — I493 Ventricular premature depolarization: Secondary | ICD-10-CM | POA: Diagnosis not present

## 2022-04-07 DIAGNOSIS — E7800 Pure hypercholesterolemia, unspecified: Secondary | ICD-10-CM | POA: Diagnosis not present

## 2022-08-11 DIAGNOSIS — I480 Paroxysmal atrial fibrillation: Secondary | ICD-10-CM | POA: Diagnosis not present

## 2022-08-11 DIAGNOSIS — I493 Ventricular premature depolarization: Secondary | ICD-10-CM | POA: Diagnosis not present

## 2022-08-11 DIAGNOSIS — E1159 Type 2 diabetes mellitus with other circulatory complications: Secondary | ICD-10-CM | POA: Diagnosis not present

## 2022-08-11 DIAGNOSIS — I5022 Chronic systolic (congestive) heart failure: Secondary | ICD-10-CM | POA: Diagnosis not present

## 2022-08-11 DIAGNOSIS — I2510 Atherosclerotic heart disease of native coronary artery without angina pectoris: Secondary | ICD-10-CM | POA: Diagnosis not present

## 2022-08-11 DIAGNOSIS — Z23 Encounter for immunization: Secondary | ICD-10-CM | POA: Diagnosis not present

## 2022-08-11 DIAGNOSIS — E7800 Pure hypercholesterolemia, unspecified: Secondary | ICD-10-CM | POA: Diagnosis not present

## 2022-08-11 DIAGNOSIS — I255 Ischemic cardiomyopathy: Secondary | ICD-10-CM | POA: Diagnosis not present

## 2022-08-11 DIAGNOSIS — I10 Essential (primary) hypertension: Secondary | ICD-10-CM | POA: Diagnosis not present

## 2022-10-16 DIAGNOSIS — L97522 Non-pressure chronic ulcer of other part of left foot with fat layer exposed: Secondary | ICD-10-CM | POA: Diagnosis not present

## 2022-10-16 DIAGNOSIS — L851 Acquired keratosis [keratoderma] palmaris et plantaris: Secondary | ICD-10-CM | POA: Diagnosis not present

## 2022-10-16 DIAGNOSIS — E1142 Type 2 diabetes mellitus with diabetic polyneuropathy: Secondary | ICD-10-CM | POA: Diagnosis not present

## 2022-10-16 DIAGNOSIS — B351 Tinea unguium: Secondary | ICD-10-CM | POA: Diagnosis not present

## 2022-10-17 ENCOUNTER — Telehealth: Payer: Self-pay | Admitting: *Deleted

## 2022-10-17 NOTE — Patient Outreach (Signed)
  Care Coordination   Initial Visit Note   10/17/2022 Name: ASHVIN DOHNER MRN: [id number] DOB: [birth year]  WYDELL ODHAM is a 82 y.o. year old male who sees Deboraha Sprang, MD for primary care. I spoke with  Janalyn Shy by phone today.  What matters to the patients health and wellness today?  Declines offer to participate in program.     SDOH assessments and interventions completed:  No     Care Coordination Interventions:  No, not indicated   Follow up plan: No further intervention required.   Encounter Outcome:  Pt. Visit Completed   Valente David, RN, MSN, [place] [facility] Care Management Coordinator [phone]

## 2022-10-30 DIAGNOSIS — E1142 Type 2 diabetes mellitus with diabetic polyneuropathy: Secondary | ICD-10-CM | POA: Diagnosis not present

## 2022-10-30 DIAGNOSIS — L97521 Non-pressure chronic ulcer of other part of left foot limited to breakdown of skin: Secondary | ICD-10-CM | POA: Diagnosis not present

## 2022-11-28 DIAGNOSIS — L97521 Non-pressure chronic ulcer of other part of left foot limited to breakdown of skin: Secondary | ICD-10-CM | POA: Diagnosis not present

## 2022-11-28 DIAGNOSIS — M19072 Primary osteoarthritis, left ankle and foot: Secondary | ICD-10-CM | POA: Diagnosis not present

## 2022-11-28 DIAGNOSIS — E1142 Type 2 diabetes mellitus with diabetic polyneuropathy: Secondary | ICD-10-CM | POA: Diagnosis not present

## 2022-11-28 DIAGNOSIS — S9032XA Contusion of left foot, initial encounter: Secondary | ICD-10-CM | POA: Diagnosis not present

## 2022-11-28 DIAGNOSIS — S90122A Contusion of left lesser toe(s) without damage to nail, initial encounter: Secondary | ICD-10-CM | POA: Diagnosis not present

## 2023-02-17 DIAGNOSIS — E1142 Type 2 diabetes mellitus with diabetic polyneuropathy: Secondary | ICD-10-CM | POA: Diagnosis not present

## 2023-02-17 DIAGNOSIS — L97522 Non-pressure chronic ulcer of other part of left foot with fat layer exposed: Secondary | ICD-10-CM | POA: Diagnosis not present

## 2023-02-17 DIAGNOSIS — L851 Acquired keratosis [keratoderma] palmaris et plantaris: Secondary | ICD-10-CM | POA: Diagnosis not present

## 2023-02-17 DIAGNOSIS — I739 Peripheral vascular disease, unspecified: Secondary | ICD-10-CM | POA: Diagnosis not present

## 2023-02-17 DIAGNOSIS — B351 Tinea unguium: Secondary | ICD-10-CM | POA: Diagnosis not present

## 2023-02-24 DIAGNOSIS — H5203 Hypermetropia, bilateral: Secondary | ICD-10-CM | POA: Diagnosis not present

## 2023-02-24 DIAGNOSIS — E119 Type 2 diabetes mellitus without complications: Secondary | ICD-10-CM | POA: Diagnosis not present

## 2023-02-24 DIAGNOSIS — H52223 Regular astigmatism, bilateral: Secondary | ICD-10-CM | POA: Diagnosis not present

## 2023-02-24 DIAGNOSIS — H524 Presbyopia: Secondary | ICD-10-CM | POA: Diagnosis not present

## 2023-02-24 DIAGNOSIS — H2513 Age-related nuclear cataract, bilateral: Secondary | ICD-10-CM | POA: Diagnosis not present

## 2023-02-24 DIAGNOSIS — Z7984 Long term (current) use of oral hypoglycemic drugs: Secondary | ICD-10-CM | POA: Diagnosis not present

## 2023-03-19 DIAGNOSIS — L97521 Non-pressure chronic ulcer of other part of left foot limited to breakdown of skin: Secondary | ICD-10-CM | POA: Diagnosis not present

## 2023-03-19 DIAGNOSIS — E1142 Type 2 diabetes mellitus with diabetic polyneuropathy: Secondary | ICD-10-CM | POA: Diagnosis not present

## 2023-03-20 DIAGNOSIS — I7141 Pararenal abdominal aortic aneurysm, without rupture: Secondary | ICD-10-CM | POA: Diagnosis not present

## 2023-03-20 DIAGNOSIS — Z6838 Body mass index (BMI) 38.0-38.9, adult: Secondary | ICD-10-CM | POA: Diagnosis not present

## 2023-03-20 DIAGNOSIS — E7800 Pure hypercholesterolemia, unspecified: Secondary | ICD-10-CM | POA: Diagnosis not present

## 2023-03-20 DIAGNOSIS — I480 Paroxysmal atrial fibrillation: Secondary | ICD-10-CM | POA: Diagnosis not present

## 2023-03-20 DIAGNOSIS — I2510 Atherosclerotic heart disease of native coronary artery without angina pectoris: Secondary | ICD-10-CM | POA: Diagnosis not present

## 2023-03-20 DIAGNOSIS — I255 Ischemic cardiomyopathy: Secondary | ICD-10-CM | POA: Diagnosis not present

## 2023-03-20 DIAGNOSIS — I25118 Atherosclerotic heart disease of native coronary artery with other forms of angina pectoris: Secondary | ICD-10-CM | POA: Diagnosis not present

## 2023-03-20 DIAGNOSIS — E1159 Type 2 diabetes mellitus with other circulatory complications: Secondary | ICD-10-CM | POA: Diagnosis not present

## 2023-03-20 DIAGNOSIS — I5022 Chronic systolic (congestive) heart failure: Secondary | ICD-10-CM | POA: Diagnosis not present

## 2023-03-20 DIAGNOSIS — I493 Ventricular premature depolarization: Secondary | ICD-10-CM | POA: Diagnosis not present

## 2023-03-20 DIAGNOSIS — I10 Essential (primary) hypertension: Secondary | ICD-10-CM | POA: Diagnosis not present

## 2023-04-30 DIAGNOSIS — L851 Acquired keratosis [keratoderma] palmaris et plantaris: Secondary | ICD-10-CM | POA: Diagnosis not present

## 2023-04-30 DIAGNOSIS — E1142 Type 2 diabetes mellitus with diabetic polyneuropathy: Secondary | ICD-10-CM | POA: Diagnosis not present

## 2023-04-30 DIAGNOSIS — B351 Tinea unguium: Secondary | ICD-10-CM | POA: Diagnosis not present

## 2023-04-30 DIAGNOSIS — L97521 Non-pressure chronic ulcer of other part of left foot limited to breakdown of skin: Secondary | ICD-10-CM | POA: Diagnosis not present

## 2023-07-21 DIAGNOSIS — E7800 Pure hypercholesterolemia, unspecified: Secondary | ICD-10-CM | POA: Diagnosis not present

## 2023-07-21 DIAGNOSIS — I493 Ventricular premature depolarization: Secondary | ICD-10-CM | POA: Diagnosis not present

## 2023-07-21 DIAGNOSIS — I2510 Atherosclerotic heart disease of native coronary artery without angina pectoris: Secondary | ICD-10-CM | POA: Diagnosis not present

## 2023-07-21 DIAGNOSIS — I10 Essential (primary) hypertension: Secondary | ICD-10-CM | POA: Diagnosis not present

## 2023-07-21 DIAGNOSIS — I5022 Chronic systolic (congestive) heart failure: Secondary | ICD-10-CM | POA: Diagnosis not present

## 2023-07-21 DIAGNOSIS — I7141 Pararenal abdominal aortic aneurysm, without rupture: Secondary | ICD-10-CM | POA: Diagnosis not present

## 2023-07-21 DIAGNOSIS — E1159 Type 2 diabetes mellitus with other circulatory complications: Secondary | ICD-10-CM | POA: Diagnosis not present

## 2023-07-21 DIAGNOSIS — I255 Ischemic cardiomyopathy: Secondary | ICD-10-CM | POA: Diagnosis not present

## 2023-07-21 DIAGNOSIS — I480 Paroxysmal atrial fibrillation: Secondary | ICD-10-CM | POA: Diagnosis not present

## 2023-07-30 DIAGNOSIS — B351 Tinea unguium: Secondary | ICD-10-CM | POA: Diagnosis not present

## 2023-07-30 DIAGNOSIS — E1142 Type 2 diabetes mellitus with diabetic polyneuropathy: Secondary | ICD-10-CM | POA: Diagnosis not present

## 2023-07-30 DIAGNOSIS — L851 Acquired keratosis [keratoderma] palmaris et plantaris: Secondary | ICD-10-CM | POA: Diagnosis not present

## 2023-11-04 DIAGNOSIS — B351 Tinea unguium: Secondary | ICD-10-CM | POA: Diagnosis not present

## 2023-11-04 DIAGNOSIS — L851 Acquired keratosis [keratoderma] palmaris et plantaris: Secondary | ICD-10-CM | POA: Diagnosis not present

## 2023-11-04 DIAGNOSIS — E1142 Type 2 diabetes mellitus with diabetic polyneuropathy: Secondary | ICD-10-CM | POA: Diagnosis not present

## 2023-11-18 DIAGNOSIS — I255 Ischemic cardiomyopathy: Secondary | ICD-10-CM | POA: Diagnosis not present

## 2023-11-18 DIAGNOSIS — I2510 Atherosclerotic heart disease of native coronary artery without angina pectoris: Secondary | ICD-10-CM | POA: Diagnosis not present

## 2023-11-18 DIAGNOSIS — E1159 Type 2 diabetes mellitus with other circulatory complications: Secondary | ICD-10-CM | POA: Diagnosis not present

## 2023-11-18 DIAGNOSIS — I480 Paroxysmal atrial fibrillation: Secondary | ICD-10-CM | POA: Diagnosis not present

## 2023-11-18 DIAGNOSIS — I10 Essential (primary) hypertension: Secondary | ICD-10-CM | POA: Diagnosis not present

## 2023-11-18 DIAGNOSIS — I7141 Pararenal abdominal aortic aneurysm, without rupture: Secondary | ICD-10-CM | POA: Diagnosis not present

## 2023-11-18 DIAGNOSIS — I5022 Chronic systolic (congestive) heart failure: Secondary | ICD-10-CM | POA: Diagnosis not present

## 2023-11-18 DIAGNOSIS — E7800 Pure hypercholesterolemia, unspecified: Secondary | ICD-10-CM | POA: Diagnosis not present

## 2023-11-18 DIAGNOSIS — I493 Ventricular premature depolarization: Secondary | ICD-10-CM | POA: Diagnosis not present

## 2024-02-10 DIAGNOSIS — B351 Tinea unguium: Secondary | ICD-10-CM | POA: Diagnosis not present

## 2024-02-10 DIAGNOSIS — L851 Acquired keratosis [keratoderma] palmaris et plantaris: Secondary | ICD-10-CM | POA: Diagnosis not present

## 2024-02-10 DIAGNOSIS — E1142 Type 2 diabetes mellitus with diabetic polyneuropathy: Secondary | ICD-10-CM | POA: Diagnosis not present

## 2024-02-23 DEATH — deceased
